# Patient Record
Sex: Female | Born: 1935
Health system: Southern US, Community
[De-identification: ages and names within clinical notes are randomized; demographics above are authoritative.]

## PROBLEM LIST (undated history)

## (undated) DIAGNOSIS — K56609 Unspecified intestinal obstruction, unspecified as to partial versus complete obstruction: Secondary | ICD-10-CM

## (undated) DIAGNOSIS — I219 Acute myocardial infarction, unspecified: Secondary | ICD-10-CM

## (undated) DIAGNOSIS — M359 Systemic involvement of connective tissue, unspecified: Secondary | ICD-10-CM

## (undated) DIAGNOSIS — D509 Iron deficiency anemia, unspecified: Secondary | ICD-10-CM

## (undated) DIAGNOSIS — K56600 Partial intestinal obstruction, unspecified as to cause: Secondary | ICD-10-CM

## (undated) DIAGNOSIS — I10 Essential (primary) hypertension: Secondary | ICD-10-CM

## (undated) DIAGNOSIS — E785 Hyperlipidemia, unspecified: Secondary | ICD-10-CM

## (undated) DIAGNOSIS — C4490 Unspecified malignant neoplasm of skin, unspecified: Secondary | ICD-10-CM

## (undated) DIAGNOSIS — K219 Gastro-esophageal reflux disease without esophagitis: Secondary | ICD-10-CM

## (undated) DIAGNOSIS — M1990 Unspecified osteoarthritis, unspecified site: Secondary | ICD-10-CM

## (undated) DIAGNOSIS — E039 Hypothyroidism, unspecified: Secondary | ICD-10-CM

## (undated) DIAGNOSIS — F419 Anxiety disorder, unspecified: Secondary | ICD-10-CM

## (undated) DIAGNOSIS — I2510 Atherosclerotic heart disease of native coronary artery without angina pectoris: Secondary | ICD-10-CM

## (undated) DIAGNOSIS — S3991XA Unspecified injury of abdomen, initial encounter: Secondary | ICD-10-CM

## (undated) HISTORY — DX: Hypothyroidism, unspecified: E03.9

## (undated) HISTORY — DX: Hyperlipidemia, unspecified: E78.5

## (undated) HISTORY — DX: Essential (primary) hypertension: I10

## (undated) HISTORY — PX: PARTIAL HYSTERECTOMY: SHX80

## (undated) HISTORY — PX: HAND SURGERY: SHX662

## (undated) HISTORY — DX: Unspecified intestinal obstruction, unspecified as to partial versus complete obstruction: K56.609

## (undated) HISTORY — PX: HEMORRHOID BANDING: SHX5850

## (undated) HISTORY — DX: Unspecified malignant neoplasm of skin, unspecified: C44.90

## (undated) HISTORY — DX: Unspecified osteoarthritis, unspecified site: M19.90

## (undated) HISTORY — PX: TONSILLECTOMY: SUR1361

## (undated) HISTORY — DX: Iron deficiency anemia, unspecified: D50.9

## (undated) HISTORY — PX: NOSE SURGERY: SHX723

## (undated) HISTORY — PX: EXPLORATORY LAPAROTOMY: SUR591

## (undated) HISTORY — PX: CHOLECYSTECTOMY: SHX55

## (undated) HISTORY — DX: Unspecified injury of abdomen, initial encounter: S39.91XA

## (undated) HISTORY — PX: CATARACT EXTRACTION: SUR2

## (undated) HISTORY — PX: THYROIDECTOMY: SHX17

## (undated) HISTORY — PX: CARDIAC CATHETERIZATION: SHX172

## (undated) HISTORY — DX: Atherosclerotic heart disease of native coronary artery without angina pectoris: I25.10

## (undated) HISTORY — PX: KNEE ARTHROSCOPY: SUR90

## (undated) HISTORY — PX: APPENDECTOMY: SHX54

---

## 1998-04-24 ENCOUNTER — Ambulatory Visit (HOSPITAL_COMMUNITY): Admission: RE | Admit: 1998-04-24 | Discharge: 1998-04-24 | Payer: Self-pay

## 2001-02-15 ENCOUNTER — Encounter: Payer: Self-pay | Admitting: Orthopedic Surgery

## 2001-02-20 ENCOUNTER — Inpatient Hospital Stay (HOSPITAL_COMMUNITY): Admission: RE | Admit: 2001-02-20 | Discharge: 2001-02-22 | Payer: Self-pay | Admitting: Orthopedic Surgery

## 2001-02-20 ENCOUNTER — Encounter: Payer: Self-pay | Admitting: Orthopedic Surgery

## 2004-06-08 ENCOUNTER — Ambulatory Visit (HOSPITAL_COMMUNITY): Admission: RE | Admit: 2004-06-08 | Discharge: 2004-06-08 | Payer: Self-pay | Admitting: Internal Medicine

## 2004-08-10 ENCOUNTER — Ambulatory Visit: Payer: Self-pay | Admitting: Gastroenterology

## 2004-08-12 ENCOUNTER — Encounter: Admission: RE | Admit: 2004-08-12 | Discharge: 2004-08-12 | Payer: Self-pay | Admitting: General Surgery

## 2004-08-27 ENCOUNTER — Encounter: Admission: RE | Admit: 2004-08-27 | Discharge: 2004-08-27 | Payer: Self-pay | Admitting: General Surgery

## 2004-09-17 ENCOUNTER — Encounter (INDEPENDENT_AMBULATORY_CARE_PROVIDER_SITE_OTHER): Payer: Self-pay | Admitting: *Deleted

## 2004-09-17 ENCOUNTER — Inpatient Hospital Stay (HOSPITAL_COMMUNITY): Admission: RE | Admit: 2004-09-17 | Discharge: 2004-09-21 | Payer: Self-pay | Admitting: General Surgery

## 2004-10-20 ENCOUNTER — Ambulatory Visit: Payer: Self-pay | Admitting: Internal Medicine

## 2004-11-29 ENCOUNTER — Ambulatory Visit (HOSPITAL_COMMUNITY): Admission: RE | Admit: 2004-11-29 | Discharge: 2004-11-29 | Payer: Self-pay | Admitting: Internal Medicine

## 2004-12-23 ENCOUNTER — Encounter (HOSPITAL_COMMUNITY): Admission: RE | Admit: 2004-12-23 | Discharge: 2005-01-22 | Payer: Self-pay | Admitting: Internal Medicine

## 2004-12-23 ENCOUNTER — Ambulatory Visit (HOSPITAL_COMMUNITY): Payer: Self-pay | Admitting: Internal Medicine

## 2006-03-08 ENCOUNTER — Ambulatory Visit: Payer: Self-pay | Admitting: Internal Medicine

## 2006-03-24 ENCOUNTER — Encounter (INDEPENDENT_AMBULATORY_CARE_PROVIDER_SITE_OTHER): Payer: Self-pay | Admitting: Specialist

## 2006-03-24 ENCOUNTER — Ambulatory Visit (HOSPITAL_COMMUNITY): Admission: RE | Admit: 2006-03-24 | Discharge: 2006-03-24 | Payer: Self-pay | Admitting: Internal Medicine

## 2006-03-24 ENCOUNTER — Ambulatory Visit: Payer: Self-pay | Admitting: Internal Medicine

## 2006-03-24 HISTORY — PX: COLONOSCOPY: SHX174

## 2006-03-24 HISTORY — PX: ESOPHAGOGASTRODUODENOSCOPY: SHX1529

## 2006-06-12 ENCOUNTER — Ambulatory Visit: Payer: Self-pay | Admitting: Internal Medicine

## 2006-09-20 ENCOUNTER — Ambulatory Visit: Payer: Self-pay | Admitting: Internal Medicine

## 2007-05-15 ENCOUNTER — Ambulatory Visit: Payer: Self-pay | Admitting: Internal Medicine

## 2007-05-23 ENCOUNTER — Encounter: Admission: RE | Admit: 2007-05-23 | Discharge: 2007-05-23 | Payer: Self-pay | Admitting: Orthopedic Surgery

## 2007-07-02 ENCOUNTER — Encounter: Payer: Self-pay | Admitting: Cardiology

## 2008-02-15 ENCOUNTER — Ambulatory Visit: Payer: Self-pay | Admitting: Internal Medicine

## 2008-09-12 DIAGNOSIS — I219 Acute myocardial infarction, unspecified: Secondary | ICD-10-CM

## 2008-09-12 HISTORY — DX: Acute myocardial infarction, unspecified: I21.9

## 2008-09-19 ENCOUNTER — Encounter: Payer: Self-pay | Admitting: Cardiology

## 2008-09-28 ENCOUNTER — Encounter: Payer: Self-pay | Admitting: Cardiology

## 2008-09-28 ENCOUNTER — Ambulatory Visit: Payer: Self-pay | Admitting: Cardiology

## 2008-09-28 ENCOUNTER — Inpatient Hospital Stay (HOSPITAL_COMMUNITY): Admission: EM | Admit: 2008-09-28 | Discharge: 2008-09-30 | Payer: Self-pay | Admitting: Cardiovascular Disease

## 2008-09-29 ENCOUNTER — Encounter: Payer: Self-pay | Admitting: Cardiology

## 2008-09-29 ENCOUNTER — Ambulatory Visit: Payer: Self-pay | Admitting: *Deleted

## 2008-10-03 ENCOUNTER — Encounter: Payer: Self-pay | Admitting: Cardiology

## 2008-10-03 ENCOUNTER — Ambulatory Visit: Payer: Self-pay | Admitting: Cardiology

## 2008-10-04 ENCOUNTER — Ambulatory Visit: Payer: Self-pay | Admitting: Cardiovascular Disease

## 2008-10-04 ENCOUNTER — Inpatient Hospital Stay (HOSPITAL_COMMUNITY): Admission: AD | Admit: 2008-10-04 | Discharge: 2008-10-08 | Payer: Self-pay | Admitting: Cardiology

## 2008-10-05 ENCOUNTER — Encounter: Payer: Self-pay | Admitting: Cardiology

## 2008-10-21 ENCOUNTER — Ambulatory Visit: Payer: Self-pay | Admitting: Cardiology

## 2008-10-21 DIAGNOSIS — I2510 Atherosclerotic heart disease of native coronary artery without angina pectoris: Secondary | ICD-10-CM | POA: Diagnosis present

## 2008-10-21 DIAGNOSIS — E782 Mixed hyperlipidemia: Secondary | ICD-10-CM | POA: Insufficient documentation

## 2009-01-12 ENCOUNTER — Ambulatory Visit: Payer: Self-pay | Admitting: Cardiology

## 2009-04-24 ENCOUNTER — Encounter: Payer: Self-pay | Admitting: Cardiology

## 2009-04-29 ENCOUNTER — Encounter (INDEPENDENT_AMBULATORY_CARE_PROVIDER_SITE_OTHER): Payer: Self-pay | Admitting: *Deleted

## 2009-05-01 ENCOUNTER — Ambulatory Visit: Payer: Self-pay | Admitting: Cardiology

## 2009-05-01 ENCOUNTER — Encounter: Payer: Self-pay | Admitting: Physician Assistant

## 2009-05-01 DIAGNOSIS — D638 Anemia in other chronic diseases classified elsewhere: Secondary | ICD-10-CM

## 2009-05-03 ENCOUNTER — Ambulatory Visit: Payer: Self-pay | Admitting: Cardiology

## 2009-05-05 ENCOUNTER — Ambulatory Visit: Payer: Self-pay | Admitting: Cardiology

## 2009-05-05 ENCOUNTER — Encounter: Payer: Self-pay | Admitting: Cardiology

## 2009-05-05 ENCOUNTER — Inpatient Hospital Stay (HOSPITAL_COMMUNITY): Admission: AD | Admit: 2009-05-05 | Discharge: 2009-05-09 | Payer: Self-pay | Admitting: Cardiology

## 2009-05-07 ENCOUNTER — Encounter: Payer: Self-pay | Admitting: Cardiology

## 2009-05-08 ENCOUNTER — Encounter: Payer: Self-pay | Admitting: Cardiology

## 2009-05-21 ENCOUNTER — Ambulatory Visit: Payer: Self-pay | Admitting: Cardiology

## 2009-07-10 ENCOUNTER — Telehealth (INDEPENDENT_AMBULATORY_CARE_PROVIDER_SITE_OTHER): Payer: Self-pay | Admitting: *Deleted

## 2009-07-22 ENCOUNTER — Encounter: Payer: Self-pay | Admitting: Cardiology

## 2009-07-29 DIAGNOSIS — D509 Iron deficiency anemia, unspecified: Secondary | ICD-10-CM | POA: Insufficient documentation

## 2009-07-31 ENCOUNTER — Ambulatory Visit (HOSPITAL_COMMUNITY): Admission: RE | Admit: 2009-07-31 | Discharge: 2009-07-31 | Payer: Self-pay | Admitting: Internal Medicine

## 2009-07-31 ENCOUNTER — Encounter: Payer: Self-pay | Admitting: Urgent Care

## 2009-07-31 ENCOUNTER — Ambulatory Visit: Payer: Self-pay | Admitting: Internal Medicine

## 2009-07-31 DIAGNOSIS — K633 Ulcer of intestine: Secondary | ICD-10-CM | POA: Insufficient documentation

## 2009-07-31 DIAGNOSIS — R109 Unspecified abdominal pain: Secondary | ICD-10-CM | POA: Insufficient documentation

## 2009-07-31 DIAGNOSIS — E039 Hypothyroidism, unspecified: Secondary | ICD-10-CM | POA: Insufficient documentation

## 2009-07-31 DIAGNOSIS — M129 Arthropathy, unspecified: Secondary | ICD-10-CM | POA: Insufficient documentation

## 2009-07-31 LAB — CONVERTED CEMR LAB
Alkaline Phosphatase: 63 units/L (ref 39–117)
Bilirubin, Direct: <0.1 mg/dL (ref 0.0–0.3)
Chloride: 104 meq/L (ref 96–112)
Creatinine, Ser: 0.93 mg/dL (ref 0.40–1.20)
Eosinophils Absolute: 0.1 10*3/uL (ref 0.0–0.7)
Lymphocytes Relative: 14 % (ref 12–46)
Lymphs Abs: 1.1 10*3/uL (ref 0.7–4.0)
MCV: 93.9 fL (ref 78.0–100.0)
Neutro Abs: 5.4 10*3/uL (ref 1.7–7.7)
Neutrophils Relative %: 76 % (ref 43–77)
Platelets: 231 10*3/uL (ref 150–400)
Sodium: 142 meq/L (ref 135–145)
Total Protein: 5.5 g/dL — ABNORMAL LOW (ref 6.0–8.3)
WBC: 7.2 10*3/uL (ref 4.0–10.5)

## 2009-08-03 ENCOUNTER — Encounter: Payer: Self-pay | Admitting: Internal Medicine

## 2009-08-04 ENCOUNTER — Encounter: Payer: Self-pay | Admitting: Internal Medicine

## 2009-08-04 ENCOUNTER — Encounter (INDEPENDENT_AMBULATORY_CARE_PROVIDER_SITE_OTHER): Payer: Self-pay | Admitting: *Deleted

## 2009-08-10 ENCOUNTER — Encounter: Payer: Self-pay | Admitting: Cardiology

## 2009-08-12 ENCOUNTER — Telehealth (INDEPENDENT_AMBULATORY_CARE_PROVIDER_SITE_OTHER): Payer: Self-pay | Admitting: *Deleted

## 2009-09-02 ENCOUNTER — Encounter (INDEPENDENT_AMBULATORY_CARE_PROVIDER_SITE_OTHER): Payer: Self-pay | Admitting: *Deleted

## 2009-09-18 ENCOUNTER — Ambulatory Visit: Payer: Self-pay | Admitting: Cardiology

## 2009-09-18 DIAGNOSIS — I10 Essential (primary) hypertension: Secondary | ICD-10-CM

## 2009-10-19 ENCOUNTER — Encounter: Payer: Self-pay | Admitting: Gastroenterology

## 2009-10-20 ENCOUNTER — Encounter (INDEPENDENT_AMBULATORY_CARE_PROVIDER_SITE_OTHER): Payer: Self-pay | Admitting: *Deleted

## 2009-10-21 ENCOUNTER — Encounter: Payer: Self-pay | Admitting: Cardiology

## 2009-10-26 ENCOUNTER — Encounter (INDEPENDENT_AMBULATORY_CARE_PROVIDER_SITE_OTHER): Payer: Self-pay | Admitting: *Deleted

## 2009-11-16 ENCOUNTER — Encounter (INDEPENDENT_AMBULATORY_CARE_PROVIDER_SITE_OTHER): Payer: Self-pay | Admitting: *Deleted

## 2009-11-16 ENCOUNTER — Encounter: Payer: Self-pay | Admitting: Cardiology

## 2009-11-19 ENCOUNTER — Encounter: Payer: Self-pay | Admitting: Cardiology

## 2009-12-14 ENCOUNTER — Encounter (INDEPENDENT_AMBULATORY_CARE_PROVIDER_SITE_OTHER): Payer: Self-pay | Admitting: *Deleted

## 2009-12-21 ENCOUNTER — Encounter: Payer: Self-pay | Admitting: Cardiology

## 2009-12-21 ENCOUNTER — Encounter: Payer: Self-pay | Admitting: Physician Assistant

## 2009-12-21 ENCOUNTER — Encounter (INDEPENDENT_AMBULATORY_CARE_PROVIDER_SITE_OTHER): Payer: Self-pay | Admitting: *Deleted

## 2009-12-22 ENCOUNTER — Inpatient Hospital Stay (HOSPITAL_COMMUNITY): Admission: EM | Admit: 2009-12-22 | Discharge: 2009-12-24 | Payer: Self-pay | Admitting: Cardiology

## 2009-12-22 ENCOUNTER — Encounter: Payer: Self-pay | Admitting: Physician Assistant

## 2009-12-22 ENCOUNTER — Ambulatory Visit: Payer: Self-pay | Admitting: Cardiovascular Disease

## 2009-12-22 ENCOUNTER — Ambulatory Visit: Payer: Self-pay | Admitting: Cardiology

## 2009-12-23 ENCOUNTER — Encounter: Payer: Self-pay | Admitting: Physician Assistant

## 2010-01-19 ENCOUNTER — Ambulatory Visit: Payer: Self-pay | Admitting: Internal Medicine

## 2010-01-21 LAB — CONVERTED CEMR LAB: Magnesium: 2 mg/dL (ref 1.5–2.5)

## 2010-01-27 ENCOUNTER — Encounter: Payer: Self-pay | Admitting: Internal Medicine

## 2010-01-27 ENCOUNTER — Ambulatory Visit: Payer: Self-pay | Admitting: Physician Assistant

## 2010-01-27 DIAGNOSIS — I2510 Atherosclerotic heart disease of native coronary artery without angina pectoris: Secondary | ICD-10-CM | POA: Insufficient documentation

## 2010-01-28 DIAGNOSIS — Z8719 Personal history of other diseases of the digestive system: Secondary | ICD-10-CM | POA: Insufficient documentation

## 2010-01-28 DIAGNOSIS — K922 Gastrointestinal hemorrhage, unspecified: Secondary | ICD-10-CM | POA: Insufficient documentation

## 2010-02-02 ENCOUNTER — Telehealth: Payer: Self-pay | Admitting: Physician Assistant

## 2010-04-12 ENCOUNTER — Ambulatory Visit: Payer: Self-pay | Admitting: Cardiology

## 2010-07-15 ENCOUNTER — Encounter (INDEPENDENT_AMBULATORY_CARE_PROVIDER_SITE_OTHER): Payer: Self-pay | Admitting: *Deleted

## 2010-07-26 ENCOUNTER — Encounter: Payer: Self-pay | Admitting: Cardiology

## 2010-10-12 ENCOUNTER — Ambulatory Visit
Admission: RE | Admit: 2010-10-12 | Discharge: 2010-10-12 | Payer: Self-pay | Source: Home / Self Care | Attending: Cardiology | Admitting: Cardiology

## 2010-10-12 ENCOUNTER — Encounter: Payer: Self-pay | Admitting: Cardiology

## 2010-10-14 NOTE — Medication Information (Signed)
Summary: Tax adviser   Imported By: Ricard Dillon 10/19/2009 08:52:27  _____________________________________________________________________  External Attachment:    Type:   Image     Comment:   External Document  Appended Document: RX Folder-entocort    Prescriptions: ENTOCORT EC 3 MG XR24H-CAP (BUDESONIDE) Take 2 tablet by mouth once a day  #180 x 3   Entered and Authorized by:   Leanna Battles. Dixon Boos   Signed by:   Leanna Battles Lewis PA-C on 10/19/2009   Method used:   Faxed to ...       [facility] (retail)             , [place]         Ph: [phone]       Fax: [phone]   RxID:   [id number]    Patient needs OV with RMR only in the next 2-3 months for f/u small bowel ulcers

## 2010-10-14 NOTE — Assessment & Plan Note (Signed)
Summary: 3 MO FU PER AUG REMINDER   Visit Type:  Follow-up Primary Provider:  Dr. Fara Chute   History of Present Illness: 77 year old woman presents for followup. She denies any significant angina. Reports NYHA class II dyspnea on exertion. She does state that she felt very fatigued on Crestor, and this medicine has been cut back to twice weekly by Dr. Neita Carp who is following her lipids.  Interval phone note was reviewed. Medications have been modified since her last visit. She states she has been checking her blood pressure at home, and typically her systolic runs in the "140-160" range, allowing her to take the medicines outlined below regularly. If she finds her blood pressure is low, she holds her medications.   Preventive Screening-Counseling & Management  Alcohol-Tobacco     Smoking Status: never  Current Medications (verified): 1)  Aspir-Trin 325 Mg Tbec (Aspirin) .... Take 1/2 Tablet By Mouth Once A Day 2)  Nexium 40 Mg Cpdr (Esomeprazole Magnesium) .... Take 1 Tablet By Mouth Once A Day 3)  Ferrous Sulfate 325 (65 Fe) Mg  Tabs (Ferrous Sulfate) .... Take 1 Tablet By Mouth Two Times A Day 4)  Metoprolol Tartrate 25 Mg Tabs (Metoprolol Tartrate) .... Take 1/2 Tablet By Mouth Two Times A Day 5)  Entocort Ec 3 Mg Xr24h-Cap (Budesonide) .... Take 2 Tablet By Mouth Once A Day As Needed 6)  Diclofenac Sodium 75 Mg Tbec (Diclofenac Sodium) .... Take 1 Tablet By Mouth Two Times A Day 7)  Fish Oil 1000 Mg Caps (Omega-3 Fatty Acids) .... Take 1 Tablet By Mouth Two Times A Day 8)  Vitamin B-12 500 Mcg  Tabs (Cyanocobalamin) .... Once Daily 9)  Lisinopril 40 Mg Tabs (Lisinopril) .... Take One Half  Tablet By Mouth Daily 10)  Nitrostat 0.4 Mg Subl (Nitroglycerin) .Marland Kitchen.. 1 Tablet Under Tongue At Onset of Chest Pain; You May Repeat Every 5 Minutes For Up To 3 Doses. 11)  Levoxyl 100 Mcg Tabs (Levothyroxine Sodium) .... Take 1 Tablet By Mouth Once A Day 12)  Crestor 20 Mg Tabs (Rosuvastatin  Calcium) .... Take One Tablet By Mouth Twice Weekly 13)  Vitamin D 1000 Unit Tabs (Cholecalciferol) .... Take 1 Tablet By Mouth Once A Day 14)  Isosorbide Mononitrate Cr 30 Mg Xr24h-Tab (Isosorbide Mononitrate) .... Take 1/2  Tablet By Mouth Once A Day 15)  Clonazepam 0.5 Mg Tabs (Clonazepam) .... Take 1/2-1 By Mouth Every Six Hours As Needed Anxiety 16)  Caltrate 600+d 600-400 Mg-Unit Tabs (Calcium Carbonate-Vitamin D) .... Take 1 Tablet By Mouth Once A Day  Allergies (verified): 1)  ! Codeine 2)  ! Celebrex 3)  ! * Oxycodone/apap  Comments:  Nurse/Medical Assistant: The patient's medication bottles and allergies were reviewed with the patient and were updated in the Medication and Allergy Lists.  Past History:  Past Medical History: Last updated: 09/18/2009 Arthritis CAD - NSTEMI 1/10, PTCA nondominant RCA 1/10, LVEF normal Hyperlipidemia Hypertension Hypothyroidism Small bowel ulcers on GIVENS capsule study 03/24/2009, multiple areas of ulceration, mid-distal SB, active bleeding distal SB Prometheus panel suggestive of Crohn's disease  Iron deficiency anemia Colonoscopy/EGD by Dr Jena Gauss 03/24/2009->benign bx, normal  Social History: Last updated: 07/31/2009 Widowed, lives w/ son 3 living children, 1 deceased Tobacco Use - No.  Alcohol Use - no Regular Exercise - yes Drug Use - no  Review of Systems  The patient denies anorexia, fever, chest pain, syncope, dyspnea on exertion, peripheral edema, melena, and hematochezia.  Otherwise reviewed and negative.  Vital Signs:  Patient profile:   76 year old female Height:      64 inches Weight:      137 pounds Pulse rate:   56 / minute BP sitting:   145 / 76  (left arm) Cuff size:   regular  Vitals Entered By: Carlye Grippe (April 12, 2010 10:41 AM)  Physical Exam  Additional Exam:  GEN:77 year old female, sitting a right, in no distress HEENT: NCAT,PERRLA,EOMI NECK: palpable pulses, no bruits; no JVD; no  TM LUNGS: CTA bilaterally HEART: RRR (S1S2); no significant murmurs; no rubs; no gallops ABD: soft, NT; intact BS WJX:BJYNWG left wrist incision site, with no hematoma. Palpable pulse. No lower extremity edema SKIN: warm, dry MUSC: no obvious deformity NEURO: A/O (x3)     Cardiac Cath  Procedure date:  12/24/2009  Findings:      PROCEDURAL FINDINGS:  Aortic pressure is 144/52 with a mean of 88, left   ventricular pressure is 149/3.      Left ventriculography shows normal LV function.  The LVEF is 55% by   visual estimate.  There is no mitral regurgitation.      Coronary angiography:  The left mainstem is free of significant disease.   It divides into the LAD and left circumflex.      LAD:  The LAD is a large vessel that wraps around the left ventricular   apex.  There is a mild plaque proximally with 20% to 30% stenosis   present.  There are 2 small diagonal branches present.  The first   diagonal, which is very small, has an 80% ostial lesion.  The second   diagonal has a 50% ostial lesion.  Both of these are 1 mm or smaller   vessels.      Left circumflex:  The left circumflex is dominant.  It supplies 2 large   OM branches, a left PDA branch, and 2 small left posterolateral   branches.  There is no obstructive disease throughout the course of the   left circumflex.  There are some minor irregularities in the midportion   of the vessel.      Right coronary artery:  This is a small, nondominant vessel.  There is   subtotal occlusion in the midportion of the vessel with a 99% stenosis.   There is TIMI II flow distally.  This appearance is essentially   unchanged from the previous study.   Impression & Recommendations:  Problem # 1:  CAD (ICD-414.00)  Symptomatically stable on medical therapy. Medical management of obstructive disease involving a small nondominant RCA. Clinic followup for 6 months, sooner if needed.  Her updated medication list for this problem  includes:    Aspir-trin 325 Mg Tbec (Aspirin) .Marland Kitchen... Take 1/2 tablet by mouth once a day    Metoprolol Tartrate 25 Mg Tabs (Metoprolol tartrate) .Marland Kitchen... Take 1/2 tablet by mouth two times a day    Lisinopril 40 Mg Tabs (Lisinopril) .Marland Kitchen... Take one half  tablet by mouth daily    Nitrostat 0.4 Mg Subl (Nitroglycerin) .Marland Kitchen... 1 tablet under tongue at onset of chest pain; you may repeat every 5 minutes for up to 3 doses.    Isosorbide Mononitrate Cr 30 Mg Xr24h-tab (Isosorbide mononitrate) .Marland Kitchen... Take 1/2  tablet by mouth once a day  Problem # 2:  ESSENTIAL HYPERTENSION, BENIGN (ICD-401.1)  Continue present medical regimen. Patient to check blood pressure at home as she has been.  The  following medications were removed from the medication list:    Hydrochlorothiazide 12.5 Mg Tabs (Hydrochlorothiazide) .Marland Kitchen... Take one tablet by mouth daily. Her updated medication list for this problem includes:    Aspir-trin 325 Mg Tbec (Aspirin) .Marland Kitchen... Take 1/2 tablet by mouth once a day    Metoprolol Tartrate 25 Mg Tabs (Metoprolol tartrate) .Marland Kitchen... Take 1/2 tablet by mouth two times a day    Lisinopril 40 Mg Tabs (Lisinopril) .Marland Kitchen... Take one half  tablet by mouth daily  Problem # 3:  MIXED HYPERLIPIDEMIA (ICD-272.2)  Patient taking her Crestor only twice weekly. She states she feels better energy with this. Followup lipids per Dr. Neita Carp.  Her updated medication list for this problem includes:    Crestor 20 Mg Tabs (Rosuvastatin calcium) .Marland Kitchen... Take one tablet by mouth twice weekly  Patient Instructions: 1)  Your physician wants you to follow-up in: 6 months. You will receive a reminder letter in the mail one-two months in advance. If you don't receive a letter, please call our office to schedule the follow-up appointment. 2)  Your physician recommends that you continue on your current medications as directed. Please refer to the Current Medication list given to you today.

## 2010-10-14 NOTE — Consult Note (Signed)
Summary: CARDIOLOGY CONSULT/ [facility]  CARDIOLOGY CONSULT/ [facility]   Imported By: Zachary George 01/26/2010 14:00:15  _____________________________________________________________________  External Attachment:    Type:   Image     Comment:   External Document

## 2010-10-14 NOTE — Medication Information (Signed)
Summary: nexium refill  nexium refill   Imported By: Hendricks Limes LPN 16/06/9603 54:09:81  _____________________________________________________________________  External Attachment:    Type:   Image     Comment:   External Document

## 2010-10-14 NOTE — Letter (Signed)
Summary: Scheduled Appointment  [facility]  [address]   [place], [place] [address]   Phone: [phone]  Fax: [phone]    December 21, 2009   Dear: Andrea Jackson            DOB: 1934-08-25    I have been instructed to schedule you an appointment in our office.  Your appointment is as follows:   Date: 01/19/10       Time: 9:00     Please be here 15 minutes early.   Provider: Rourk    Please contact the office if you need to reschedule this appointment for a more convenient time.   Thank you,    [facility] Ph: [phone]   Fax: [phone]

## 2010-10-14 NOTE — Letter (Signed)
Summary: Engineer, materials at [facility]  [address]   [place], [place] [address]   Phone: [phone]  Fax: [phone]        October 26, 2009 MRN: [id number]    [facility] [address] [place], [place]  [address]    Dear Ms. Makar,  Your test ordered by Selena Batten has been reviewed by your physician (or physician assistant) and was found to be normal or stable. Your physician (or physician assistant) felt no changes were needed at this time.  ____ Echocardiogram  ____ Cardiac Stress Test  __X__ Lab Work  ____ Peripheral vascular study of arms, legs or neck  ____ CT scan or X-ray  ____ Lung or Breathing test  ____ Other:   Thank you.   Cyril Loosen, RN, BSN    Duane Boston, M.D., F.A.C.C. Thressa Sheller, M.D., F.A.C.C. Oneal Grout, M.D., F.A.C.C. Cheree Ditto, M.D., F.A.C.C. Daiva Nakayama, M.D., F.A.C.C. Kenney Houseman, M.D., F.A.C.C. Jeanne Ivan, PA-C

## 2010-10-14 NOTE — Assessment & Plan Note (Signed)
Summary: 3 MO FU PER DEC REMINDER-SRS   Visit Type:  Follow-up Primary Provider:  Dr. Fara Chute   History of Present Illness: 77 year old woman presents for a followup visit. She reports feeling well, without significant angina since I last saw her. She underwent elective right knee arthroscopic surgery in mid December without incident. She is doing physical therapy now.  She has been taking both simvastatin and Crestor mistakenly. We talked about this today.  Labs from late November revealed a total cholesterol of 242, triglycerides 144, HDL 67, LDL 146, AST 17, ALT 27, BUN 21, creatinine 0.9, potassium 4.2.  She denies having any palpitations or progressive shortness of breath. Appetite is stable.  Preventive Screening-Counseling & Management  Alcohol-Tobacco     Smoking Status: never  Current Medications (verified): 1)  Aspirin Ec 325 Mg Tbec (Aspirin) .... Take One Tablet By Mouth Daily 2)  Nexium 40 Mg Cpdr (Esomeprazole Magnesium) .... Take 1 Tablet By Mouth Two Times A Day 3)  Ferrous Sulfate 325 (65 Fe) Mg  Tabs (Ferrous Sulfate) .... Take 1 Tablet By Mouth Two Times A Day 4)  Metoprolol Tartrate 25 Mg Tabs (Metoprolol Tartrate) .Marland Kitchen.. 1 1/2 Tabs Two Times A Day 5)  Entocort Ec 3 Mg Xr24h-Cap (Budesonide) .... Take 2 Tablet By Mouth Once A Day 6)  Diclofenac Sodium 75 Mg Tbec (Diclofenac Sodium) .... Take 1 Tablet By Mouth Two Times A Day 7)  Fish Oil 1000 Mg Caps (Omega-3 Fatty Acids) .... One Tablet Every Morning 8)  Vitamin C 500 Mg  Tabs (Ascorbic Acid) .... Once Daily 9)  Vitamin B-12 500 Mcg  Tabs (Cyanocobalamin) .... Once Daily 10)  Hydrochlorothiazide 12.5 Mg Tabs (Hydrochlorothiazide) .... Take One Tablet By Mouth Daily. 11)  Lisinopril 40 Mg Tabs (Lisinopril) .... Take One Tablet By Mouth Daily 12)  Nitroglycerin 0.4 Mg Subl (Nitroglycerin) .... One Tablet Under Tongue Every 5 Minutes As Needed For Chest Pain---May Repeat Times Three 13)  Levoxyl 100 Mcg Tabs  (Levothyroxine Sodium) .... Take 1 Tablet By Mouth Once A Day 14)  Crestor 20 Mg Tabs (Rosuvastatin Calcium) .... Take One Tablet By Mouth At Bedtime 15)  Simvastatin 40 Mg Tabs (Simvastatin) .... Take 1/2 Tablet By Mouth Once A Day 16)  Diazepam 5 Mg Tabs (Diazepam) .... Take 1/2-1 By Mouth 2-3 Times Daily As Needed 17)  Tramadol Hcl 50 Mg Tabs (Tramadol Hcl) .... Take 1 Tablet By Mouth Three Times A Day As Needed  Allergies (verified): 1)  ! Codeine 2)  ! Celebrex 3)  ! * Oxycodone/apap  Comments:  Nurse/Medical Assistant: The patient's medications and allergies were reviewed with the patient and were updated in the Medication and Allergy Lists. Bottles brought.  Past History:  Social History: Last updated: 07/31/2009 Widowed, lives w/ son 3 living children, 1 deceased Tobacco Use - No.  Alcohol Use - no Regular Exercise - yes Drug Use - no  Past Medical History: Arthritis CAD - NSTEMI 1/10, PTCA nondominant RCA 1/10, LVEF normal Hyperlipidemia Hypertension Hypothyroidism Small bowel ulcers on GIVENS capsule study 03/24/2009, multiple areas of ulceration, mid-distal SB, active bleeding distal SB Prometheus panel suggestive of Crohn's disease  Iron deficiency anemia Colonoscopy/EGD by Dr Jena Gauss 03/24/2009->benign bx, normal  Review of Systems  The patient denies anorexia, fever, weight loss, chest pain, syncope, dyspnea on exertion, abdominal pain, melena, hematochezia, and severe indigestion/heartburn.         Residual right knee discomfort. Otherwise reviewed and negative.  Vital Signs:  Patient profile:  77 year old female Height:      64 inches Weight:      137 pounds Pulse rate:   49 / minute BP sitting:   124 / 70  (left arm) Cuff size:   regular  Vitals Entered By: Carlye Grippe (September 18, 2009 9:41 AM)   Physical Exam  Additional Exam:  Normally nourished woman in no acute distress. HEENT: Conjunctiva and lids normal, oropharynx clear. Neck:  Supple, no carotid bruits. No thyromegaly. Lungs: Clear without labored breathing. Cardiac: Regular rate and rhythm, no S3 gallop. No rub. Abdomen: Soft, nontender. Extremities: Venous varicosities noted. No pitting edema. Distal pulses 2+. Skin: Warm and dry. Musculoskeletal: No kyphosis. Neuropsychiatric: Alert oriented x3.   Impression & Recommendations:  Problem # 1:  CORONARY ATHEROSCLEROSIS NATIVE CORONARY ARTERY (ICD-414.01)  No active angina on present medical regimen. Followup visit will be arranged in 6 months.  The following medications were removed from the medication list:    Isosorbide Mononitrate Cr 30 Mg Xr24h-tab (Isosorbide mononitrate) .Marland Kitchen... Take 1/2  tab by mouth at bedtime Her updated medication list for this problem includes:    Aspirin Ec 325 Mg Tbec (Aspirin) .Marland Kitchen... Take one tablet by mouth daily    Metoprolol Tartrate 25 Mg Tabs (Metoprolol tartrate) .Marland Kitchen... 1 1/2 tabs two times a day    Lisinopril 40 Mg Tabs (Lisinopril) .Marland Kitchen... Take one tablet by mouth daily    Nitroglycerin 0.4 Mg Subl (Nitroglycerin) ..... One tablet under tongue every 5 minutes as needed for chest pain---may repeat times three  Problem # 2:  MIXED HYPERLIPIDEMIA (ICD-272.2)  Patient to stop simvastatin, and continue on Crestor. Followup lipid profile liver function tests will be obtained in 2 months.  Her updated medication list for this problem includes:    Crestor 20 Mg Tabs (Rosuvastatin calcium) .Marland Kitchen... Take one tablet by mouth at bedtime    Simvastatin 40 Mg Tabs (Simvastatin) .Marland Kitchen... Take 1/2 tablet by mouth once a day  Problem # 3:  ESSENTIAL HYPERTENSION, BENIGN (ICD-401.1)  Blood pressure well controlled today.  Her updated medication list for this problem includes:    Aspirin Ec 325 Mg Tbec (Aspirin) .Marland Kitchen... Take one tablet by mouth daily    Metoprolol Tartrate 25 Mg Tabs (Metoprolol tartrate) .Marland Kitchen... 1 1/2 tabs two times a day    Hydrochlorothiazide 12.5 Mg Tabs (Hydrochlorothiazide)  .Marland Kitchen... Take one tablet by mouth daily.    Lisinopril 40 Mg Tabs (Lisinopril) .Marland Kitchen... Take one tablet by mouth daily  Patient Instructions: 1)  FLP/LFT in 2 months. 2)  Only take Crestor, not the Zocor.  3)  Follow up in 6 months.

## 2010-10-14 NOTE — Progress Notes (Signed)
Summary: Decreased BP  Phone Note Call from Patient Call back at [facility] Phone [phone]   Summary of Call: Pt called stating she's concerned about HR and BP. She states during recent OV her Metoprolol was decreased to 1 tablet two times a day and ASA was decreased to 81mg  from 325mg . Pt also states isosorbide was decreased to 1/2 tablets. This is not indicated in pt's chart or office note from last week. She states the PA told her to make this change when he was in the room with her.   She states BP running low and when she bends over she feels like she's going to pass out. She states BP has been as low as 93/74. Sunday am BP was 102/65 w/HR 100. She stats she was extrememly diaphoretic and had to take a cold shower before she could go to church. She states Monday HR was 75. She states this am BP was 122/103 and HR was 90. She had not taken her meds whne she took this BP, though.  She is concerned that BP may be too low and HR may be too high.  Initial call taken by: Cyril Loosen, RN, BSN,  Feb 02, 2010 3:08 PM  Follow-up for Phone Call        D/C HCTZ. Follow-up by: Nelida Meuse, PA-C,  Feb 03, 2010 11:21 AM  Additional Follow-up for Phone Call Additional follow up Details #1::        Pt states she hasn't taken any HCTZ for the past week. She states she's also only taken about 1 or 2Lisinopril in the past week b/c of low BP.  Cyril Loosen, RN, BSN  Feb 03, 2010 11:56 AM     Additional Follow-up for Phone Call Additional follow up Details #2::    d/c lisinopril Follow-up by: Nelida Meuse, PA-C,  Feb 03, 2010 12:56 PM  Additional Follow-up for Phone Call Additional follow up Details #3:: Details for Additional Follow-up Action Taken: Pt notified and verbalized understanding. Additional Follow-up by: Cyril Loosen, RN, BSN,  Feb 03, 2010 2:18 PM

## 2010-10-14 NOTE — Letter (Signed)
Summary: Recall Office Visit  [facility]  [address]   [place], [place] [address]   Phone: [phone]  Fax: [phone]      July 15, 2010   HAYA HEMLER [address] [place], [place]  [address] 1934-07-22   Dear Ms. Sabala,   According to our records, it is time for you to schedule a follow-up office visit with [place].   At your convenience, please call [phone] to schedule an office visit. If you have any questions, concerns, or feel that this letter is in error, we would appreciate your call.   Sincerely,    Diana Eves  [facility] Ph: [phone]   Fax: [phone]

## 2010-10-14 NOTE — Letter (Signed)
Summary: Generic Engineer, agricultural at [facility] [address]   [place], [place] [address]   Phone: [phone]  Fax: [phone]        November 16, 2009 MRN: [id number]    Andrea Jackson [address] [place], [place]  [address]    Dear [name],   According to our records, it is now time for your follow up lab work.  Please take the enclosed order to the [facility] at your earliest convenience.  Reminder:  Nothing to eat or drink after 12 midnight prior to labs.        Sincerely,  Hoover Brunette, LPN  This letter has been electronically signed by your physician.

## 2010-10-14 NOTE — Miscellaneous (Signed)
Summary: Orders Update  Clinical Lists Changes  Orders: Added new Test order of T-Lipid Profile ([id number]) - Signed Added new Test order of T-Hepatic Function ([id number]) - Signed 

## 2010-10-14 NOTE — Miscellaneous (Signed)
Summary: Orders Update - FLP/LFT  Clinical Lists Changes  Orders: Added new Test order of T-Lipid Profile ([id number]) - Signed Added new Test order of T-Hepatic Function ([id number]) - Signed 

## 2010-10-14 NOTE — Letter (Signed)
Summary: [facility]  [facility]  [address]   [place], [place] [address]   Phone: [phone]  Fax: [phone]    12/14/2009  Andrea Jackson [address] [place], [place]  [address] [birth year]  Dear [name],   Your physician has indicated that:   _______it is time to schedule an appointment.   _______you missed your appointment on______ and need to call and  reschedule.   _______you need to have lab work done.   _______you need to schedule an appointment to discuss lab or test results.   _______you need to call to reschedule your appointment that was scheduled on _________.   Please call our office at  [phone].    Thank you,    [facility] Ph: [phone]   Fax: [phone]

## 2010-10-14 NOTE — Letter (Signed)
Summary: [facility] D/C DR.Fara Chute  [facility] D/C DR.PAUL SASSER   Imported By: Zachary George 01/26/2010 13:59:47  _____________________________________________________________________  External Attachment:    Type:   Image     Comment:   External Document

## 2010-10-14 NOTE — Assessment & Plan Note (Signed)
Summary: EPH-POST CATH FU 4/13   Visit Type:  Follow-up Primary Provider:  Dr. Neita Carp   History of Present Illness: 77 year old female, with known CAD followed by Dr. Diona Browner, presents for posthospital followup. She initially presented to [facility] and was referred to Dr. Andee Lineman for formal consultation. Serial cardiac markers were normal. However, she continued to have ongoing pain, and was transferred to [facility] for cardiac catheterization. She was found to have stable coronary anatomy with severe stenosis of small, nondominant RCA. Normal LV function. Medical therapy recommended. No medication adjustments noted.  Since discharge, patient denies any recurrent chest discomfort. She refers to her initial symptoms as a "soreness", when she presented here to [facility]. She continues to walk regularly and work in the yard, with no associated symptoms. Of note, she does have intermittent dizziness, particularly when bending over, but denies any frank syncope.    Preventive Screening-Counseling & Management  Alcohol-Tobacco     Smoking Status: never  Current Medications (verified): 1)  Aspirin Ec 325 Mg Tbec (Aspirin) .... Take One Tablet By Mouth Daily 2)  Nexium 40 Mg Cpdr (Esomeprazole Magnesium) .... Take 1 Tablet By Mouth Once A Day 3)  Ferrous Sulfate 325 (65 Fe) Mg  Tabs (Ferrous Sulfate) .... Take 1 Tablet By Mouth Two Times A Day 4)  Metoprolol Tartrate 25 Mg Tabs (Metoprolol Tartrate) .Marland Kitchen.. 1 1/2 Tabs Two Times A Day 5)  Entocort Ec 3 Mg Xr24h-Cap (Budesonide) .... Take 1 Tablet By Mouth Once A Day 6)  Diclofenac Sodium 75 Mg Tbec (Diclofenac Sodium) .... Take 1 Tablet By Mouth Two Times A Day 7)  Fish Oil 1000 Mg Caps (Omega-3 Fatty Acids) .... Take 1 Tablet By Mouth Two Times A Day 8)  Vitamin B-12 500 Mcg  Tabs (Cyanocobalamin) .... Once Daily 9)  Hydrochlorothiazide 12.5 Mg Tabs (Hydrochlorothiazide) .... Take One Tablet By Mouth Daily. 10)  Lisinopril 40 Mg Tabs (Lisinopril)  .... Take One Tablet By Mouth Daily 11)  Nitroglycerin 0.4 Mg Subl (Nitroglycerin) .... One Tablet Under Tongue Every 5 Minutes As Needed For Chest Pain---May Repeat Times Three 12)  Levoxyl 100 Mcg Tabs (Levothyroxine Sodium) .... Take 1 Tablet By Mouth Once A Day 13)  Crestor 20 Mg Tabs (Rosuvastatin Calcium) .... Take One Tablet By Mouth At Bedtime 14)  Tramadol Hcl 50 Mg Tabs (Tramadol Hcl) .... Take 1 Tablet By Mouth Twice A Day As Needed 15)  Vitamin D .... Once Daily 16)  Isosorbide Mononitrate Cr 30 Mg Xr24h-Tab (Isosorbide Mononitrate) .... Take 1 Tablet By Mouth Once A Day 17)  Clonazepam 0.5 Mg Tabs (Clonazepam) .... Take 1/2-1 By Mouth Every Six Hours As Needed Anxiety 18)  Doxycycline Hyclate 100 Mg Solr (Doxycycline Hyclate) .... Take 1 Tablet By Mouth Two Times A Day  Allergies (verified): 1)  ! Codeine 2)  ! Celebrex 3)  ! * Oxycodone/apap  Comments:  Nurse/Medical Assistant: The patient's medications and allergies were reviewed with the patient and were updated in the Medication and Allergy Lists. Bottles reviewed.  Review of Systems       No fevers, chills, hemoptysis, dysphagia, melena, hematocheezia, hematuria, rash, claudication, orthopnea, pnd, pedal edema. Denies any complications of the left wrist incision site. All other systems negative.   Vital Signs:  Patient profile:   77 year old female Height:      64 inches Weight:      138 pounds Pulse rate:   52 / minute BP sitting:   125 / 66  (left  arm) Cuff size:   regular  Vitals Entered By: Carlye Grippe (Jan 27, 2010 12:58 PM)   Physical Exam  Additional Exam:  GEN:77 year old female, sitting a right, in no distress HEENT: NCAT,PERRLA,EOMI NECK: palpable pulses, no bruits; no JVD; no TM LUNGS: CTA bilaterally HEART: RRR (S1S2); no significant murmurs; no rubs; no gallops ABD: soft, NT; intact BS JXB:JYNWGN left wrist incision site, with no hematoma. Palpable pulse. No lower extremity edema SKIN:  warm, dry MUSC: no obvious deformity NEURO: A/O (x3)     Impression & Recommendations:  Problem # 1:  CAD (ICD-414.00) patient has not had any recurrent chest discomfort, since undergoing recent hospitalization. cardiac catheterization revealed stable coronary anatomy, with severe stenosis of a small, nondominant RCA. Patient had previously failed PTCA of the RCA, and continued medical therapy was recommended. However, no new adjustments were made. Will continue current medications, but decrease metoprolol to one tablet b.i.d., in light of her noted postural lightheadedness, and current low resting heart rate of 52. Will decrease aspirin to 81 mg daily. Will plan return clinic visit with myself and Dr. Durenda Hurt in 3 months.  Problem # 2:  MIXED HYPERLIPIDEMIA (ICD-272.2) aggressive medical management recommended, with target LDL of 70 or less, if possible. We'll defer to Dr. Neita Carp, for ongoing monitoring and management.  Problem # 3:  ESSENTIAL HYPERTENSION, BENIGN (ICD-401.1) stable in her medications.  Patient Instructions: 1)  Your physician wants you to follow-up in: 3 months. You will receive a reminder letter in the mail one-two months in advance. If you don't receive a letter, please call our office to schedule the follow-up appointment. 2)  Decrease Aspirin to 81mg  by mouth once daily. 3)  Decrease Metoprolol to 1 tablet by mouth two times a day. Prescriptions: NITROSTAT 0.4 MG SUBL (NITROGLYCERIN) 1 tablet under tongue at onset of chest pain; you may repeat every 5 minutes for up to 3 doses.  #25 x 3   Entered by:   Cyril Loosen, RN, BSN   Authorized by:   Nelida Meuse, PA-C   Signed by:   Cyril Loosen, RN, BSN on 01/27/2010   Method used:   Electronically to        [facility] [address] (retail)       [address]       [place], [place]  [address]       Ph: [phone] or [phone]       Fax: [phone]   RxID:    [id number]
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
## 2010-10-20 ENCOUNTER — Encounter: Payer: Self-pay | Admitting: Cardiology

## 2010-10-20 NOTE — Assessment & Plan Note (Signed)
Summary: 6 M O FU PER FEB REMINDER-SRS   Visit Type:  Follow-up Primary Provider:  Dr. Fara Chute   History of Present Illness: Andrea Jackson presents for followup. She was last seen in August 2011.  Reports no progressive chest pain, only used nitroglycerin on one occasion since I saw her. She does describe an episode of syncope that occurred she was traveling in [place] a few months ago. She was singing in a church choir, also experiencing back pain at the time, became dizzy and passed out briefly. Description seems consistent with vasovagal syncope. She tells me she was hospitalized, had no evidence of heart attack, and underwent reassuring stress testing.  Medications are reviewed. She no longer takes Crestor, stopped this related to leg discomfort. She is taking omega-3 supplements and flaxseed oil. She reports recent labs Dr. Neita Carp a few weeks ago.  Preventive Screening-Counseling & Management  Alcohol-Tobacco     Smoking Status: never  Current Medications (verified): 1)  Aspir-Trin 325 Mg Tbec (Aspirin) .... Take 1/2 Tablet By Mouth Once A Day 2)  Nexium 40 Mg Cpdr (Esomeprazole Magnesium) .... Take 1 Tablet By Mouth Once A Day 3)  Ferrous Sulfate 325 (65 Fe) Mg  Tabs (Ferrous Sulfate) .... Take 1 Tablet By Mouth Two Times A Day 4)  Metoprolol Tartrate 25 Mg Tabs (Metoprolol Tartrate) .... Take 1/2 Tablet By Mouth Two Times A Day 5)  Diclofenac Sodium Andrea Mg Tbec (Diclofenac Sodium) .... Take 1 Tablet By Mouth Two Times A Day 6)  Fish Oil 1000 Mg Caps (Omega-3 Fatty Acids) .... Take 1 Tablet By Mouth Daily 7)  Lisinopril 40 Mg Tabs (Lisinopril) .... Take 1 Tablet By Mouth Once A Day As Needed 8)  Nitrostat 0.4 Mg Subl (Nitroglycerin) .Marland Kitchen.. 1 Tablet Under Tongue At Onset of Chest Pain; You May Repeat Every 5 Minutes For Up To 3 Doses. 9)  Levoxyl 100 Mcg Tabs (Levothyroxine Sodium) .... Take 1 Tablet By Mouth Once A Day 10)  Isosorbide Mononitrate Cr 30 Mg Xr24h-Tab  (Isosorbide Mononitrate) .... Take 1/2  Tablet By Mouth Once A Day 11)  Clonazepam 0.5 Mg Tabs (Clonazepam) .... Take 1/2-1 By Mouth Every Six Hours As Needed Anxiety 12)  Caltrate 600+d 600-400 Mg-Unit Tabs (Calcium Carbonate-Vitamin D) .... Take 1 Tablet By Mouth Once A Day 13)  Flax Seed Oil 1000 Mg Caps (Flaxseed (Linseed)) .... Take 1 Tablet By Mouth Once A Day [facility] 14)  Promethazine Hcl 25 Mg Tabs (Promethazine Hcl) .... Take 1 Tablet By Mouth Four Times A Day As Needed 15)  Prednisone (Pak) 10 Mg Tabs (Prednisone) .... Use As Directed  Allergies (verified): 1)  ! Codeine 2)  ! Celebrex 3)  ! * Oxycodone/apap  Comments:  Nurse/Medical Assistant: The patient's medication bottles and allergies were reviewed with the patient and were updated in the Medication and Allergy Lists.  Past History:  Past Medical History: Last updated: 09/18/2009 Arthritis CAD - NSTEMI 1/10, PTCA nondominant RCA 1/10, LVEF normal Hyperlipidemia Hypertension Hypothyroidism Small bowel ulcers on GIVENS capsule study 03/24/2009, multiple areas of ulceration, mid-distal SB, active bleeding distal SB Prometheus panel suggestive of Crohn's disease  Iron deficiency anemia Colonoscopy/EGD by Dr Jena Gauss 03/24/2009->benign bx, normal  Social History: Last updated: 07/31/2009 Widowed, lives w/ son 3 living children, 1 deceased Tobacco Use - No.  Alcohol Use - no Regular Exercise - yes Drug Use - no  Review of Systems  The patient denies anorexia, fever, weight loss, chest pain, dyspnea on exertion,  peripheral edema, prolonged cough, melena, and hematochezia.         Problems with chronic lower back pain and left hip pain. Otherwise reviewed and negative.  Vital Signs:  Patient profile:   And[age] year old female Height:      64 inches Weight:      135 pounds Pulse rate:   72 / minute BP sitting:   131 / Andrea  (left arm) Cuff size:   regular  Vitals Entered By: Carlye Grippe (October 12, 2010 1:52  PM)  Physical Exam  Additional Exam:  Normally nourished appearing Jackson in no acute distress. HEENT: Conjunctiva and lids normal, oropharynx with moist mucosa. Neck: Supple, no elevated JVP or bruits. Lungs: Clear to auscultation, nonlabored. Cardiac: Regular rate and rhythm, no S3 or pericardial rub. Abdomen: Soft, nontender, bowel sounds present. Skin: Warm and dry. Extremities: No pitting edema, distal pulses full. Musculoskeletal: No kyphosis. Neuropsychiatric: Alert and oriented x3, affect appropriate.    EKG  Procedure date:  10/12/2010  Findings:      Sinus rhythm at 72 beats per minute with left anterior fascicular block.  Prior Report Reviewed for Cardiac Cath:  Findings: 12/24/2009 PROCEDURAL FINDINGS:  Aortic pressure is 144/52 with a mean of 88, left   ventricular pressure is 149/3.      Left ventriculography shows normal LV function.  The LVEF is 55% by   visual estimate.  There is no mitral regurgitation.      Coronary angiography:  The left mainstem is free of significant disease.   It divides into the LAD and left circumflex.      LAD:  The LAD is a large vessel that wraps around the left ventricular   apex.  There is a mild plaque proximally with 20% to 30% stenosis   present.  There are 2 small diagonal branches present.  The first   diagonal, which is very small, has an 80% ostial lesion.  The second   diagonal has a 50% ostial lesion.  Both of these are 1 mm or smaller   vessels.      Left circumflex:  The left circumflex is dominant.  It supplies 2 large   OM branches, a left PDA branch, and 2 small left posterolateral   branches.  There is no obstructive disease throughout the course of the   left circumflex.  There are some minor irregularities in the midportion   of the vessel.      Right coronary artery:  This is a small, nondominant vessel.  There is   subtotal occlusion in the midportion of the vessel with a 99% stenosis.   There is TIMI  II flow distally.  This appearance is essentially   unchanged from the previous study.   Comments:    Impression & Recommendations:  Problem # 1:  CAD (ICD-414.00)  Symptomatically stable on medical therapy. No changes made today. Encouraged her to remain active as tolerated. Followup in 6 months, sooner if needed.  Her updated medication list for this problem includes:    Aspir-trin 325 Mg Tbec (Aspirin) .Marland Kitchen... Take 1/2 tablet by mouth once a day    Metoprolol Tartrate 25 Mg Tabs (Metoprolol tartrate) .Marland Kitchen... Take 1/2 tablet by mouth two times a day    Lisinopril 40 Mg Tabs (Lisinopril) .Marland Kitchen... Take 1 tablet by mouth once a day as needed    Nitrostat 0.4 Mg Subl (Nitroglycerin) .Marland Kitchen... 1 tablet under tongue at onset of chest pain; you may repeat every  5 minutes for up to 3 doses.    Isosorbide Mononitrate Cr 30 Mg Xr24h-tab (Isosorbide mononitrate) .Marland Kitchen... Take 1/2  tablet by mouth once a day  Orders: EKG w/ Interpretation (93000)  Problem # 2:  ESSENTIAL HYPERTENSION, BENIGN (ICD-401.1)  Blood pressure reasonable, continue present medications.  Her updated medication list for this problem includes:    Aspir-trin 325 Mg Tbec (Aspirin) .Marland Kitchen... Take 1/2 tablet by mouth once a day    Metoprolol Tartrate 25 Mg Tabs (Metoprolol tartrate) .Marland Kitchen... Take 1/2 tablet by mouth two times a day    Lisinopril 40 Mg Tabs (Lisinopril) .Marland Kitchen... Take 1 tablet by mouth once a day as needed  Problem # 3:  MIXED HYPERLIPIDEMIA (ICD-272.2)  Patient stopped Crestor concerned about side effects. She is comfortable on omega-3 supplements and flaxseed old. Plan to request recent lab work from Dr. Neita Carp for review.  The following medications were removed from the medication list:    Crestor 20 Mg Tabs (Rosuvastatin calcium) .Marland Kitchen... Take one tablet by mouth twice weekly  Patient Instructions: 1)  Your physician wants you to follow-up in: 6 months. You will receive a reminder letter in the mail one-two months in advance.  If you don't receive a letter, please call our office to schedule the follow-up appointment. 2)  Your physician recommends that you continue on your current medications as directed. Please refer to the Current Medication list given to you today. 3)  We will request labs from Dr. Neita Carp.

## 2010-11-09 ENCOUNTER — Encounter: Payer: Self-pay | Admitting: Cardiology

## 2010-11-10 DIAGNOSIS — R079 Chest pain, unspecified: Secondary | ICD-10-CM

## 2010-11-15 ENCOUNTER — Encounter: Payer: Self-pay | Admitting: Cardiology

## 2010-12-01 LAB — COMPREHENSIVE METABOLIC PANEL
AST: 20 U/L (ref 0–37)
Albumin: 2.9 g/dL — ABNORMAL LOW (ref 3.5–5.2)
Alkaline Phosphatase: 48 U/L (ref 39–117)
BUN: 13 mg/dL (ref 6–23)
Chloride: 107 mEq/L (ref 96–112)
GFR calc Af Amer: >60 mL/min (ref >60)
Potassium: 3.8 mEq/L (ref 3.5–5.1)
Total Bilirubin: 0.3 mg/dL (ref 0.3–1.2)

## 2010-12-01 LAB — LIPID PANEL
HDL: 55 mg/dL (ref >39)
Total CHOL/HDL Ratio: 2.6 RATIO
Triglycerides: 142 mg/dL (ref <150)
VLDL: 28 mg/dL (ref 0–40)

## 2010-12-01 LAB — TSH: TSH: 2.331 u[IU]/mL (ref 0.350–4.500)

## 2010-12-15 ENCOUNTER — Encounter: Payer: Self-pay | Admitting: Cardiology

## 2010-12-16 ENCOUNTER — Encounter: Payer: Self-pay | Admitting: Cardiology

## 2010-12-16 ENCOUNTER — Ambulatory Visit (INDEPENDENT_AMBULATORY_CARE_PROVIDER_SITE_OTHER): Payer: Medicaid Other | Admitting: Cardiology

## 2010-12-16 VITALS — BP 131/80 | HR 57 | Ht 64.0 in | Wt 136.0 lb

## 2010-12-16 DIAGNOSIS — I10 Essential (primary) hypertension: Secondary | ICD-10-CM

## 2010-12-16 DIAGNOSIS — I2510 Atherosclerotic heart disease of native coronary artery without angina pectoris: Secondary | ICD-10-CM

## 2010-12-16 NOTE — Progress Notes (Signed)
Clinical Summary Ms. Consalvo is a 77 y.o.female presenting for followup. She was seen in January of this year in the office. She was seen in consultation at [facility] back in February with atypical chest pain and normal cardiac markers. Feeling was that she may have had an upper respiratory tract infection.  Recent lab work shows BUN 18, creatinine 1.0, potassium 4.2, BNP 134, hemoglobin 11.0, platelets 322.    She reports no angina, no progressive shortness of breath or cough. She has been working in her garden.  Followup ECG is reviewed below. She reports compliance with her medications, no nitroglycerin use.  Allergies  Allergen Reactions  . Celecoxib     REACTION: hyper, couldn't eat or sleep  . Codeine     REACTION: itching  . Oxycodone-Acetaminophen     Current outpatient prescriptions:aspirin 325 MG tablet, Take 162 mg by mouth daily.  , Disp: , Rfl: ;  Calcium Carbonate-Vitamin D (CALTRATE 600+D) 600-400 MG-UNIT per tablet, Take 1 tablet by mouth daily.  , Disp: , Rfl: ;  clonazePAM (KLONOPIN) 0.5 MG tablet, Take 0.25-0.5 mg by mouth every 6 (six) hours. , Disp: , Rfl: ;  diclofenac (VOLTAREN) 75 MG EC tablet, Take 75 mg by mouth 2 (two) times daily.  , Disp: , Rfl:  esomeprazole (NEXIUM) 40 MG capsule, Take 40 mg by mouth daily before breakfast.  , Disp: , Rfl: ;  ferrous sulfate 325 (65 FE) MG tablet, Take 325 mg by mouth 2 (two) times daily.  , Disp: , Rfl: ;  Flaxseed, Linseed, (FLAX SEED OIL) 1000 MG CAPS, Take by mouth daily.  , Disp: , Rfl: ;  isosorbide mononitrate (IMDUR) 30 MG 24 hr tablet, Take 15 mg by mouth daily.  , Disp: , Rfl:  levothyroxine (SYNTHROID, LEVOTHROID) 100 MCG tablet, Take 100 mcg by mouth daily.  , Disp: , Rfl: ;  lisinopril (PRINIVIL,ZESTRIL) 40 MG tablet, Take 40 mg by mouth daily. , Disp: , Rfl: ;  metoprolol tartrate (LOPRESSOR) 25 MG tablet, Take 12.5 mg by mouth 2 (two) times daily.  , Disp: , Rfl: ;  nitroGLYCERIN (NITROSTAT) 0.4 MG SL tablet, Place 0.4  mg under the tongue every 5 (five) minutes as needed.  , Disp: , Rfl:  Omega-3 Fatty Acids (FISH OIL) 1200 MG CAPS, Take by mouth daily.  , Disp: , Rfl: ;  promethazine (PHENERGAN) 25 MG tablet, Take 12.5-25 mg by mouth every 6 (six) hours as needed. , Disp: , Rfl: ;  traMADol (ULTRAM) 50 MG tablet, Take 50 mg by mouth every 6 (six) hours as needed.  , Disp: , Rfl: ;  DISCONTD: Omega-3 Fatty Acids (FISH OIL) 1000 MG CAPS, Take by mouth daily.  , Disp: , Rfl:   Past Medical History  Diagnosis Date  . Hypertension   . Hyperlipidemia   . Coronary artery disease      NSTEMI 1/10, PTCA nondominant RCA 1/10, LVEF normal  . Arthritis   . Hypothyroidism   . Small bowel ulcers     GIVENS capsule study 03/24/2009, multiple areas of ulceration, mid-distal SB   . Iron deficiency anemia   . Crohn's disease     Prometheus panel suggestive     Social History Ms. Kluesner reports that she has never smoked. She has never used smokeless tobacco. Ms. Heitzenrater reports that she does not drink alcohol.  Review of Systems No fevers, chills, or unusual weight change. No chest pain, progressive shortness of breath, cough, hemoptysis, or wheezing. No palpitations,  dizziness, or syncope. No orthopnea, PND, or lower extremity edema. No focal motor weakness, memory problems, or speech deficits. Otherwise systems reviewed and negative except as already outlined.   Physical Examination Filed Vitals:   12/16/10 1342  BP: 131/80  Pulse: 57  Normally nourished appearing woman in no acute distress. HEENT: Conjunctiva and lids normal, oropharynx with moist mucosa. Neck: Supple, no elevated JVP or bruits. Lungs: Clear to auscultation, nonlabored. Cardiac: Regular rate and rhythm, no S3 or pericardial rub. Abdomen: Soft, nontender, bowel sounds present. Skin: Warm and dry. Extremities: No pitting edema, distal pulses full. Musculoskeletal: No kyphosis. Neuropsychiatric: Alert and oriented x3, affect  appropriate.   ECG Sinus bradycardia at 54 beats per minute, leftward axis.   Problem List and Plan

## 2010-12-16 NOTE — Patient Instructions (Signed)
Your physician wants you to follow-up in: 6 months. You will receive a reminder letter in the mail one-two months in advance. If you don't receive a letter, please call our office to schedule the follow-up appointment. Your physician recommends that you continue on your current medications as directed. Please refer to the Current Medication list given to you today. 

## 2010-12-16 NOTE — Assessment & Plan Note (Signed)
No reported angina. Continue medical therapy and observation. 

## 2010-12-16 NOTE — Assessment & Plan Note (Signed)
Blood pressure reasonably well controlled today. No changes made to medical regimen.

## 2010-12-18 LAB — BASIC METABOLIC PANEL
BUN: 11 mg/dL (ref 6–23)
Calcium: 9.1 mg/dL (ref 8.4–10.5)
Chloride: 102 mEq/L (ref 96–112)
Chloride: 103 mEq/L (ref 96–112)
Creatinine, Ser: 0.84 mg/dL (ref 0.4–1.2)
Creatinine, Ser: 0.93 mg/dL (ref 0.4–1.2)
GFR calc Af Amer: >60 mL/min (ref >60)
GFR calc non Af Amer: 59 mL/min — ABNORMAL LOW (ref >60)

## 2010-12-18 LAB — CARDIAC PANEL(CRET KIN+CKTOT+MB+TROPI)
CK, MB: 2 ng/mL (ref 0.3–4.0)
Relative Index: INVALID (ref 0.0–2.5)
Relative Index: INVALID (ref 0.0–2.5)
Relative Index: INVALID (ref 0.0–2.5)
Total CK: 63 U/L (ref 7–177)
Total CK: 70 U/L (ref 7–177)
Total CK: 86 U/L (ref 7–177)
Troponin I: 0.03 ng/mL (ref 0.00–0.06)
Troponin I: 0.03 ng/mL (ref 0.00–0.06)
Troponin I: 0.03 ng/mL (ref 0.00–0.06)

## 2010-12-18 LAB — HEPARIN LEVEL (UNFRACTIONATED): Heparin Unfractionated: 0.51 IU/mL (ref 0.30–0.70)

## 2010-12-18 LAB — COMPREHENSIVE METABOLIC PANEL
AST: 40 U/L — ABNORMAL HIGH (ref 0–37)
CO2: 31 mEq/L (ref 19–32)
Calcium: 8.8 mg/dL (ref 8.4–10.5)
Creatinine, Ser: 1.04 mg/dL (ref 0.4–1.2)
GFR calc Af Amer: >60 mL/min (ref >60)
GFR calc non Af Amer: 52 mL/min — ABNORMAL LOW (ref >60)
Glucose, Bld: 151 mg/dL — ABNORMAL HIGH (ref 70–99)
Sodium: 141 mEq/L (ref 135–145)
Total Protein: 5.3 g/dL — ABNORMAL LOW (ref 6.0–8.3)

## 2010-12-18 LAB — CBC
MCHC: 32.8 g/dL (ref 30.0–36.0)
MCHC: 32.8 g/dL (ref 30.0–36.0)
MCHC: 33 g/dL (ref 30.0–36.0)
MCV: 91.9 fL (ref 78.0–100.0)
MCV: 92.2 fL (ref 78.0–100.0)
Platelets: 167 10*3/uL (ref 150–400)
RBC: 3.91 MIL/uL (ref 3.87–5.11)
RBC: 4.07 MIL/uL (ref 3.87–5.11)
RDW: 13.9 % (ref 11.5–15.5)
RDW: 13.9 % (ref 11.5–15.5)
RDW: 13.9 % (ref 11.5–15.5)
WBC: 3.9 10*3/uL — ABNORMAL LOW (ref 4.0–10.5)
WBC: 4.4 10*3/uL (ref 4.0–10.5)

## 2010-12-18 LAB — PROTIME-INR: Prothrombin Time: 13 seconds (ref 11.6–15.2)

## 2010-12-18 LAB — TSH: TSH: 0.05 u[IU]/mL — ABNORMAL LOW (ref 0.350–4.500)

## 2010-12-18 LAB — LIPID PANEL
Cholesterol: 141 mg/dL (ref 0–200)
Total CHOL/HDL Ratio: 2.4 RATIO

## 2010-12-27 LAB — BASIC METABOLIC PANEL
BUN: 11 mg/dL (ref 6–23)
CO2: 25 mEq/L (ref 19–32)
CO2: 29 mEq/L (ref 19–32)
Calcium: 8.7 mg/dL (ref 8.4–10.5)
Chloride: 102 mEq/L (ref 96–112)
Chloride: 107 mEq/L (ref 96–112)
Chloride: 112 mEq/L (ref 96–112)
Creatinine, Ser: 0.75 mg/dL (ref 0.4–1.2)
GFR calc Af Amer: >60 mL/min (ref >60)
GFR calc Af Amer: >60 mL/min (ref >60)
GFR calc Af Amer: >60 mL/min (ref >60)
Glucose, Bld: 106 mg/dL — ABNORMAL HIGH (ref 70–99)
Potassium: 4.4 mEq/L (ref 3.5–5.1)
Potassium: 4.5 mEq/L (ref 3.5–5.1)
Sodium: 139 mEq/L (ref 135–145)
Sodium: 141 mEq/L (ref 135–145)
Sodium: 142 mEq/L (ref 135–145)

## 2010-12-27 LAB — CBC
HCT: 28.3 % — ABNORMAL LOW (ref 36.0–46.0)
HCT: 29.6 % — ABNORMAL LOW (ref 36.0–46.0)
HCT: 30.5 % — ABNORMAL LOW (ref 36.0–46.0)
HCT: 32.4 % — ABNORMAL LOW (ref 36.0–46.0)
Hemoglobin: 10.6 g/dL — ABNORMAL LOW (ref 12.0–15.0)
Hemoglobin: 11.1 g/dL — ABNORMAL LOW (ref 12.0–15.0)
Hemoglobin: 9.9 g/dL — ABNORMAL LOW (ref 12.0–15.0)
MCHC: 32.8 g/dL (ref 30.0–36.0)
MCHC: 33 g/dL (ref 30.0–36.0)
MCV: 94 fL (ref 78.0–100.0)
MCV: 94.6 fL (ref 78.0–100.0)
MCV: 94.7 fL (ref 78.0–100.0)
MCV: 95 fL (ref 78.0–100.0)
MCV: 95.1 fL (ref 78.0–100.0)
Platelets: 192 10*3/uL (ref 150–400)
Platelets: 192 10*3/uL (ref 150–400)
Platelets: 200 10*3/uL (ref 150–400)
Platelets: 205 10*3/uL (ref 150–400)
Platelets: 212 10*3/uL (ref 150–400)
Platelets: 233 K/uL (ref 150–400)
RBC: 2.74 MIL/uL — ABNORMAL LOW (ref 3.87–5.11)
RBC: 3.02 MIL/uL — ABNORMAL LOW (ref 3.87–5.11)
RBC: 3.12 MIL/uL — ABNORMAL LOW (ref 3.87–5.11)
RBC: 3.22 MIL/uL — ABNORMAL LOW (ref 3.87–5.11)
RBC: 3.41 MIL/uL — ABNORMAL LOW (ref 3.87–5.11)
RBC: 3.57 MIL/uL — ABNORMAL LOW (ref 3.87–5.11)
RDW: 13.6 % (ref 11.5–15.5)
RDW: 14.8 % (ref 11.5–15.5)
WBC: 3.1 10*3/uL — ABNORMAL LOW (ref 4.0–10.5)
WBC: 3.3 10*3/uL — ABNORMAL LOW (ref 4.0–10.5)
WBC: 3.5 10*3/uL — ABNORMAL LOW (ref 4.0–10.5)
WBC: 3.5 10*3/uL — ABNORMAL LOW (ref 4.0–10.5)
WBC: 3.6 10*3/uL — ABNORMAL LOW (ref 4.0–10.5)
WBC: 5.6 10*3/uL (ref 4.0–10.5)
WBC: 5.9 10*3/uL (ref 4.0–10.5)
WBC: 7.7 K/uL (ref 4.0–10.5)

## 2010-12-27 LAB — COMPREHENSIVE METABOLIC PANEL
AST: 18 U/L (ref 0–37)
Albumin: 2.5 g/dL — ABNORMAL LOW (ref 3.5–5.2)
Alkaline Phosphatase: 46 U/L (ref 39–117)
Chloride: 98 mEq/L (ref 96–112)
GFR calc Af Amer: >60 mL/min (ref >60)
Potassium: 4.4 mEq/L (ref 3.5–5.1)
Total Bilirubin: 0.6 mg/dL (ref 0.3–1.2)
Total Protein: 4.6 g/dL — ABNORMAL LOW (ref 6.0–8.3)

## 2010-12-27 LAB — BASIC METABOLIC PANEL WITH GFR
BUN: 13 mg/dL (ref 6–23)
CO2: 31 meq/L (ref 19–32)
Calcium: 8.2 mg/dL — ABNORMAL LOW (ref 8.4–10.5)
Chloride: 105 meq/L (ref 96–112)
Creatinine, Ser: 0.79 mg/dL (ref 0.4–1.2)
GFR calc non Af Amer: >60 mL/min
Glucose, Bld: 94 mg/dL (ref 70–99)
Potassium: 4.5 meq/L (ref 3.5–5.1)
Sodium: 140 meq/L (ref 135–145)

## 2010-12-27 LAB — HEPATIC FUNCTION PANEL
ALT: 22 U/L (ref 0–35)
AST: 26 U/L (ref 0–37)
Albumin: 2.8 g/dL — ABNORMAL LOW (ref 3.5–5.2)
Alkaline Phosphatase: 48 U/L (ref 39–117)
Bilirubin, Direct: 0.1 mg/dL (ref 0.0–0.3)
Indirect Bilirubin: 0.4 mg/dL (ref 0.3–0.9)
Total Bilirubin: 0.5 mg/dL (ref 0.3–1.2)
Total Protein: 4.8 g/dL — ABNORMAL LOW (ref 6.0–8.3)

## 2010-12-27 LAB — CARDIAC PANEL(CRET KIN+CKTOT+MB+TROPI)
CK, MB: 1.6 ng/mL (ref 0.3–4.0)
CK, MB: 18.2 ng/mL — ABNORMAL HIGH (ref 0.3–4.0)
CK, MB: 23.4 ng/mL — ABNORMAL HIGH (ref 0.3–4.0)
CK, MB: 27.9 ng/mL — ABNORMAL HIGH (ref 0.3–4.0)
CK, MB: 34.8 ng/mL — ABNORMAL HIGH (ref 0.3–4.0)
CK, MB: 41.4 ng/mL — ABNORMAL HIGH (ref 0.3–4.0)
Relative Index: 10.5 — ABNORMAL HIGH (ref 0.0–2.5)
Relative Index: 11 — ABNORMAL HIGH (ref 0.0–2.5)
Relative Index: 14.4 — ABNORMAL HIGH (ref 0.0–2.5)
Relative Index: 8.3 — ABNORMAL HIGH (ref 0.0–2.5)
Relative Index: 9.4 — ABNORMAL HIGH (ref 0.0–2.5)
Relative Index: INVALID (ref 0.0–2.5)
Relative Index: INVALID (ref 0.0–2.5)
Relative Index: INVALID (ref 0.0–2.5)
Relative Index: INVALID (ref 0.0–2.5)
Total CK: 162 U/L (ref 7–177)
Total CK: 220 U/L — ABNORMAL HIGH (ref 7–177)
Total CK: 333 U/L — ABNORMAL HIGH (ref 7–177)
Total CK: 36 U/L (ref 7–177)
Total CK: 37 U/L (ref 7–177)
Total CK: 375 U/L — ABNORMAL HIGH (ref 7–177)
Total CK: 49 U/L (ref 7–177)
Troponin I: 0.04 ng/mL (ref 0.00–0.06)
Troponin I: 0.07 ng/mL — ABNORMAL HIGH (ref 0.00–0.06)
Troponin I: 0.08 ng/mL — ABNORMAL HIGH (ref 0.00–0.06)
Troponin I: 5.4 ng/mL (ref 0.00–0.06)
Troponin I: 6.19 ng/mL (ref 0.00–0.06)

## 2010-12-27 LAB — LIPID PANEL
HDL: 78 mg/dL
Total CHOL/HDL Ratio: 3.1 ratio
Triglycerides: 133 mg/dL
VLDL: 27 mg/dL (ref 0–40)

## 2010-12-27 LAB — TYPE AND SCREEN: DAT, IgG: NEGATIVE

## 2010-12-27 LAB — TSH: TSH: 2.816 u[IU]/mL (ref 0.350–4.500)

## 2010-12-27 LAB — D-DIMER, QUANTITATIVE: D-Dimer, Quant: 0.25 ug/mL-FEU (ref 0.00–0.48)

## 2010-12-27 LAB — PROTIME-INR: INR: 0.9 (ref 0.00–1.49)

## 2010-12-27 LAB — HEPARIN LEVEL (UNFRACTIONATED)
Heparin Unfractionated: 0.47 IU/mL (ref 0.30–0.70)
Heparin Unfractionated: 0.68 IU/mL (ref 0.30–0.70)

## 2011-01-07 ENCOUNTER — Ambulatory Visit (INDEPENDENT_AMBULATORY_CARE_PROVIDER_SITE_OTHER): Payer: 59 | Admitting: Urgent Care

## 2011-01-07 ENCOUNTER — Encounter: Payer: Self-pay | Admitting: Urgent Care

## 2011-01-07 VITALS — BP 161/87 | HR 63 | Temp 97.9°F | Ht 64.0 in | Wt 135.4 lb

## 2011-01-07 DIAGNOSIS — K633 Ulcer of intestine: Secondary | ICD-10-CM

## 2011-01-07 DIAGNOSIS — D509 Iron deficiency anemia, unspecified: Secondary | ICD-10-CM

## 2011-01-07 DIAGNOSIS — Z791 Long term (current) use of non-steroidal anti-inflammatories (NSAID): Secondary | ICD-10-CM

## 2011-01-07 DIAGNOSIS — K922 Gastrointestinal hemorrhage, unspecified: Secondary | ICD-10-CM

## 2011-01-07 NOTE — Assessment & Plan Note (Signed)
See GIB °

## 2011-01-07 NOTE — Assessment & Plan Note (Addendum)
Multiple SB ulcers dx 2007.  See GIB. +Prometheus for Crohn's.  Ulcers felt sec to NSAIDs.  Review of chart & procedure reports show pt will be due for high risk screening colonoscopy 03/2011.

## 2011-01-07 NOTE — Assessment & Plan Note (Addendum)
Andrea Jackson has chronic GI bleeding secondary to SB ulcers & chronic NSAID use.  HGB now normal, but pt continues to be dependant on NSAIDs for athritic pain.  Also on concomitant ASA.  On Nexium 40mg  daily for gastric cytoprotection.  Next colonoscopy mid-July 2012.

## 2011-01-07 NOTE — Assessment & Plan Note (Signed)
See GI bleed.  

## 2011-01-07 NOTE — Progress Notes (Addendum)
Referring Provider: Juliette Alcide, [place] Primary Care Physician:  Estanislado Pandy, MD Primary Gastroenterologist:  Dr. Jena Gauss  Chief Complaint  Patient presents with  . Anemia    iron levels back to normal after iron supplement started    HPI:  Andrea Jackson is a 77 y.o. female here for follow up for chronic IDA secondary to SB ulcers felt to be secondary to chronic NSAID use.  Hgb 2/12 12.8.  Feeling ok.  C/o back pain & sciatica seeing Dr. Ethelene Hal. Occ gnawing lower abd pain-constant worse since started Nucynta.  Taking occ TUMS.  Taking Nexium 40mg  daily.  Denies heartburn or indigestion.  Occ nocturnal chest pain @4am , actively seeing Dr. Diona Browner in [place].  Occ nausea, denies vomiting.  BM normal without rectal bleeding or melena.  Occ constipation.  Occasional loud gas.   Past Medical History  Diagnosis Date  . Hypertension   . Hyperlipidemia   . Coronary artery disease      NSTEMI 1/10, PTCA nondominant RCA 1/10, LVEF normal  . Arthritis   . Hypothyroidism   . Small bowel ulcers     GIVENS capsule study 03/24/2006, multiple areas of ulceration, mid-distal SB , Prometheus panel suggested Crohn's  . Iron deficiency anemia     sec chronic SB GI bleeding ulcers & chronic NSAIDs  . S/P colonoscopy 03/24/06    benign-Dr Rourk  . S/P endoscopy 03/24/06    benign-Dr. Jena Gauss     Past Surgical History  Procedure Date  . Appendectomy   . Cholecystectomy   . Total abdominal hysterectomy   . Tonsillectomy   . Nose surgery   . Hand surgery   . Cataract extraction     Current Outpatient Prescriptions  Medication Sig Dispense Refill  . aspirin 325 MG tablet Take 162 mg by mouth daily.        . Calcium Carbonate-Vitamin D (CALTRATE 600+D) 600-400 MG-UNIT per tablet Take 1 tablet by mouth daily.        . clonazePAM (KLONOPIN) 0.5 MG tablet Take 0.25-0.5 mg by mouth every 6 (six) hours.       . diclofenac (VOLTAREN) 75 MG EC tablet Take 75 mg by mouth 2 (two) times daily.        Marland Kitchen  esomeprazole (NEXIUM) 40 MG capsule Take 40 mg by mouth daily before breakfast.        . ferrous sulfate 325 (65 FE) MG tablet Take 325 mg by mouth 2 (two) times daily.        . Flaxseed, Linseed, (FLAX SEED OIL) 1000 MG CAPS Take by mouth daily.        . isosorbide mononitrate (IMDUR) 30 MG 24 hr tablet Take 15 mg by mouth daily.        Marland Kitchen levothyroxine (SYNTHROID, LEVOTHROID) 100 MCG tablet Take 100 mcg by mouth daily.        Marland Kitchen lisinopril (PRINIVIL,ZESTRIL) 40 MG tablet Take 40 mg by mouth daily.       . metoprolol tartrate (LOPRESSOR) 25 MG tablet Take 12.5 mg by mouth 2 (two) times daily.        . nitroGLYCERIN (NITROSTAT) 0.4 MG SL tablet Place 0.4 mg under the tongue every 5 (five) minutes as needed.        . Omega-3 Fatty Acids (FISH OIL) 1200 MG CAPS Take by mouth daily.        . promethazine (PHENERGAN) 25 MG tablet Take 12.5-25 mg by mouth every 6 (six) hours as needed.       Marland Kitchen  tapentadol (NUCYNTA) 50 MG TABS Take 50 mg by mouth 3 (three) times daily.        . traMADol (ULTRAM) 50 MG tablet Take 50 mg by mouth every 6 (six) hours as needed.          Allergies as of 01/07/2011 - Review Complete 01/07/2011  Allergen Reaction Noted  . Celecoxib    . Codeine    . Oxycodone-acetaminophen  12/15/2010    Family History  Problem Relation Age of Onset  . Cancer Father   . Diabetes Mother   . Cancer Son     History   Social History  . Marital Status: Widowed    Spouse Name: N/A    Number of Children: 4  . Years of Education: N/A    Social History Main Topics  . Smoking status: Never Smoker   . Smokeless tobacco: Never Used  . Alcohol Use: No  . Drug Use: No  . Sexually Active: No   Review of Systems: Gen: Denies any fever, chills, sweats, anorexia, fatigue, weakness, malaise, weight loss, and sleep disorder CV: Denies chest pain, angina, palpitations, syncope, orthopnea, PND, peripheral edema, and claudication. Resp: Denies dyspnea at rest, dyspnea with exercise, cough,  sputum, wheezing, coughing up blood, and pleurisy. GI: Denies vomiting blood, jaundice, and fecal incontinence.   Denies dysphagia or odynophagia. Derm: Denies rash, itching, dry skin, hives, moles, warts, or unhealing ulcers.  Psych: Denies depression, anxiety, memory loss, suicidal ideation, hallucinations, paranoia, and confusion. Heme: Denies bruising, bleeding, and enlarged lymph nodes.  Physical Exam: BP 161/87  Pulse 63  Temp 97.9 F (36.6 C)  Ht 5\' 4"  (1.626 m)  Wt 135 lb 6.4 oz (61.417 kg)  BMI 23.24 kg/m2 General:   Alert,  Well-developed, well-nourished, pleasant and cooperative in NAD Head:  Normocephalic and atraumatic. Eyes:  Sclera clear, no icterus.   Conjunctiva pink. Mouth:  No deformity or lesions, dentition normal. Neck:  Supple; no masses or thyromegaly. Heart:  Regular rate and rhythm; no murmurs, clicks, rubs,  or gallops. Abdomen:  Soft, nontender and nondistended. Multiple well-healed old scars.  No masses, hepatosplenomegaly or hernias noted. Normal bowel sounds, without guarding, and without rebound.   Msk:  Symmetrical without gross deformities. Normal posture. Pulses:  Normal pulses noted. Extremities:  Without clubbing or edema.  Neurologic:  Alert and  oriented x4;  grossly normal neurologically. Skin:  Intact without significant lesions or rashes. Few ecchymosis noted. Cervical Nodes:  No significant cervical adenopathy. Psych:  Alert and cooperative. Normal mood and affect.

## 2011-01-07 NOTE — Patient Instructions (Addendum)
Nexium 40mg  every day Colonoscopy 03/2011

## 2011-01-08 ENCOUNTER — Encounter: Payer: Self-pay | Admitting: Urgent Care

## 2011-01-10 NOTE — Progress Notes (Signed)
Cc to PCP 

## 2011-01-25 NOTE — Cardiovascular Report (Signed)
NAMESHERION, DOOLY                 ACCOUNT NO.:  [id number]   MEDICAL RECORD NO.:  [id number]          PATIENT TYPE:  INP   LOCATION:  2502                         FACILITY:  [facility]   PHYSICIAN:  Arturo Morton. Riley Kill, MD, FACCDATE OF BIRTH:  [birth year]   DATE OF PROCEDURE:  10/06/2008  DATE OF DISCHARGE:  10/08/2008                            CARDIAC CATHETERIZATION   INDICATIONS:  Andrea Jackson is a very nice 77 year old woman who presents  with recurrent chest discomfort and positive enzymes.  She recently  underwent diagnostic catheterization on September 29, 2008.  At that time,  she did not have critical disease in the LAD system.  She had a high-  grade subtotal stenosis of the nondominant right coronary.  Given the  size, and the fact that her LV function was preserved and her pain was  prolonged, we felt that she should be treated medically.  Dr. Excell Seltzer and  I reviewed the films, and were felt that this was the best approach  given the small size of the vessel.  Moreover, the vessel was only about  1.5 mm, and was felt not to be ideal for stenting.  As a result, she was  treated conservatively.  She is now brought back because of recurrent  symptoms.  Current study was done to assess coronary anatomy.   PROCEDURES:  1. Left heart catheterization.  2. Selective coronary arteriography.  3. Percutaneous angioplasty of the nondominant right coronary.   DESCRIPTION OF THE PROCEDURE:  The patient was brought to the lab and  prepped and draped in the usual fashion.  Through an anterior puncture,  the femoral artery was entered and a 6-French sheath was placed.  Diagnostic views of the left were obtained.  Views of the right were  obtained.  The vessel was basically nearly occluded or totally occluded.  Because of her recurrent symptomatology, it was felt that we should try  to open this artery to prevent recurrent symptoms.  She was agreeable to  proceed.  Central aortic and left  ventricular pressures were measured.  We used a bolus and drip of bivalirudin for anticoagulation.  A JR-4  guiding catheter with side holes was utilized.  Intracoronary nitro was  administered.  A Prowater guidewire was advanced down the artery after  an appropriate ACT had been achieved.  A 1.5 x 12 Apex balloon was  inflated for approximately 5 atmospheres for 60 seconds.  Repeat  dilatations were performed with this balloon.  We did several inflations  at 8 atmospheres for up to 3 minutes.  We elected not to upgrade the  balloon, and not to stent the artery due to the very small size of the  vessel.  There was improvement in flow in the artery and there were no  major complications.  All catheters were subsequently removed and the  femoral sheath was sewn into place.  She was taken to the holding area  in satisfactory clinical condition.   HEMODYNAMIC DATA:  1. Central aortic pressure was 140/67.  2. LV pressure was 133/5.  3. There was not a gradient  on pullback across aortic valve.   ANGIOGRAPHIC DATA:  1. Ventriculography was done in the RAO projection, and overall LV      function appeared to be preserved.  2. The left main was free of critical disease.  3. The LAD has some calcification.  There is perhaps 20-30% narrowing      in the midvessel and 30-40% narrowing more distally; however, high-      grade disease is not noted.  There is a very small diagonal branch      that is less than a millimeter and a half in caliber, and with 80%      ostial narrowing.  4. The circumflex provides several marginal branches and then courses      posteriorly where it provides essentially a posterior descending      branch.  There is some mild luminal irregularity, but no critical      stenosis as noted on the previous catheterization.  5. The right coronary artery is essentially occluded.  Proximally, the      vessel is calcified.  Following balloon dilatation and multiple       inflations, the total occlusion was reduced to about 30-40% luminal      reduction.  There is also narrowing of about 40-50% narrowing just      prior to bifurcation.  As noted, this is a nondominant vessel and      relatively small.   CONCLUSIONS:  1. Preserved overall left ventricular function.  2. No critical disease involving the left coronary system.  3. Successful percutaneous angioplasty only of the nondominant right      coronary artery.   DISPOSITION:  Currently, the patient will be treated medically.  We will  continue aspirin and Plavix for a few days, but eventually Plavix can be  discontinued.      Arturo Morton. Riley Kill, MD, [facility]  Electronically Signed     TDS/MEDQ  D:  10/10/2008  T:  10/11/2008  Job:  [id number]   cc:   Guerry Bruin, MD, [facility]  CV Laboratory

## 2011-01-25 NOTE — H&P (Signed)
[name], Andrea Jackson                 ACCOUNT NO.:  [id number]   MEDICAL RECORD NO.:  [id number]          PATIENT TYPE:  INP   LOCATION:  2920                         FACILITY:  [facility]   PHYSICIAN:  Gerrit Friends. Dietrich Pates, MD, FACCDATE OF BIRTH:  11-17-1933   DATE OF ADMISSION:  09/28/2008  DATE OF DISCHARGE:                              HISTORY & PHYSICAL      Tereso Newcomer, PA-C      Gerrit Friends. Dietrich Pates, MD, [facility]  Electronically Signed    SW/MEDQ  D:  09/28/2008  T:  09/29/2008  Job:  [id number]

## 2011-01-25 NOTE — Assessment & Plan Note (Signed)
NAMEMarland Kitchen  Andrea Jackson, Andrea Jackson                  CHART#:  [id number]   DATE:  02/15/2008                       DOB:  September 25, 1933   FOLLOWUP:  Iron-deficiency anemia, history of small bowel ulcer seen on  capsule study, negative EGD and colonoscopy.  Andrea Jackson was last seen  on May 15, 2007.  She was supposed to return in 3 months later but  did not.  She says she has done well.  She is taking upon herself to  drop back on Entocort to one 3 mg tablet daily and about once a week she  takes 2.  She cannot tell that this agent has any impact on her GI tract  or her arthritis at this point in time.  She has 1-2 bowel movements  daily.  She denies diarrhea, melena, or hematochezia.  Weight is  actually up 4 pounds from May 15, 2007.  She continues to take  Nexium 40 grams orally daily and again takes diclofenac daily as well as  an iron supplement.   She had a normal bone density study 1 year ago.  She is to see Dr.  Neita Carp in a couple of weeks and blood work is going to be drawn.  From August 24, 2007, the last labs we have, her H&H was 10.9 and 35.6  with MCV 92.  She had H&H 12.4 and 42.4 on May 03, 2007.   MEDICATIONS:  See updated list.   ALLERGIES:  CODEINE and CELEBREX.   PHYSICAL EXAMINATION:  GENERAL:  Pleasant 73-year lady resting  comfortably.  VITAL SIGNS:  Weight 134, height 5 feet 4, temperature 98, BP 130/88,  and pulse 68.  ABDOMEN:  Flat, positive bowel sounds, soft, entirely nontender without  appreciable mass or hepatosplenomegaly.   ASSESSMENT:  Iron deficiency anemia almost certainly related to oozing  from small bowel ulcers, which at this point, I feel are more likely  related to diclofenac than inflammatory bowel disease, although the two  could be coexistent.   I doubt ischemia with no pain.   The Prometheus antibody panel did suggest Crohn disease; however, that  assay is often unreliable.  I am hard-pressed to keep this lady on  Entocort at this point,  because it really has not made overall  clinically any difference in her symptoms.  We pretty much stuck with  her using diclofenac because of her debilitating arthritis, and she does  not want to consider coming off of it.  We will arrange for her to have  CBC when she gets labs with Dr. Neita Carp.  We will review labs  when they become available and then I will make further recommendations  in regards to further Entocort therapy and followup.       Jonathon Bellows, M.D.  Electronically Signed     RMR/MEDQ  D:  02/15/2008  T:  02/15/2008  Job:  [id number]   cc:   Andrea Jackson
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
## 2011-01-25 NOTE — Discharge Summary (Signed)
NAMEAMANDAJO, GONDER                 ACCOUNT NO.:  [id number]   MEDICAL RECORD NO.:  [id number]          PATIENT TYPE:  INP   LOCATION:  2502                         FACILITY:  [facility]   PHYSICIAN:  Arturo Morton. Riley Kill, MD, FACCDATE OF BIRTH:  1933/09/14   DATE OF ADMISSION:  10/04/2008  DATE OF DISCHARGE:  10/08/2008                               DISCHARGE SUMMARY   PRIMARY CARDIOLOGIST:  Jonelle Sidle, MD.   PRIMARY MEDICAL DOCTOR:  Dr. Fara Chute.   DISCHARGE DIAGNOSIS:  Non-ST-elevation myocardial infarction.   SECONDARY DIAGNOSES:  1. Anemia.  2. Hypertension.  3. Osteoarthritis.  4. Status post open reduction and internal fixation, right lower      extremity.  5. Status post cholecystectomy.  6. Status post appendectomy.  7. Status post hysterectomy.  8. Status post nasal surgery.  9. Status post hand surgery.  10.Status post tonsillectomy.  11.Status post thyroidectomy.   ALLERGIES:  1. CODEINE.  2. CEREBYX.  3. PERCOCET.  4. HYDROCODONE.   PROCEDURES PERFORMED DURING THIS HOSPITALIZATION:  1. On October 04, 2008, the patient had an EKG showing sinus      bradycardia of 49 bpm and with a left anterior fascicular block.  2. On October 07, 2008, the patient had an EKG showing no significant      change from EKG performed on October 04, 2008.  3. On October 06, 2008, the patient had a left heart cath, SCA, PTCA      only of RCA, and then nondominant.  Results were reduction of the      100% proximal stenosis to 40%.  There was no change seen in the      left circumflex or left anterior descending artery nor in the LV      gram.   HISTORY OF PRESENT ILLNESS:  This is a 77 year old white female who is  admitted to [facility] the week prior to this admission with a  small non-ST-elevation myocardial infarction.  On cardiac cath, she had  a subtotally occluded small nondominant RCA and other small vessel  disease, which was not optimal for PCI.  However, on  October 03, 2008,  the patient began to experience severe chest pressure.  The patient  reports the feeling like an elephant sitting on her chest.  The  patient went to the emergency department in [place], was admitted  overnight, and had ongoing intermittent chest pain, nausea and headache.  Her pain did not significantly improve with nitroglycerin.  Her initial  troponin was 0.3 at [facility] and has trended down to 0.1 at the time of her  admission to [facility] and at the time of admission she was also pain  free except for severe headache which she attributes to the  nitroglycerin paste.   HOSPITAL COURSE:  The patient was admitted and monitored to determine  whether or not she was having another non-STEMI and while at the time of  admission she was pain free.  Her pain did return on the evening of  October 05, 2008.  Chest pain was 8/10 and was  relieved with nitro  somewhat to 5/10.  Her vital signs remained stable at that time.  Her  enzymes were being monitored at this time and on October 06, 2008, in  the a.m. she had elevated troponin-I to 0.65 and an elevated CK to CK-MB  with a relative index of 14.4.  Due to the patient's recurrent chest  pain and up trending cardiac enzymes, the patient was scheduled for  cardiac catheterization on October 06, 2008, as described in the  procedures section.  The patient tolerated the procedure well and had no  significant complications other than a mild decrease in her hemoglobin  from 11.1 to 8.5.  The patient's hemoglobin when last checked at  discharge was 9.4.  The patient had minimal symptoms of anemia described  by the patient as lightheadedness with sitting or standing quickly or  with ambulation.  While the patient vital signs remained mostly stable  during her hospital course, there was a minimal elevation in her  systolic blood pressure from low 100s to 150s.  Vital signs at the time  of discharge were blood pressure 151/66, pulse 52,  respirations 15, O2  sat is 100% on room air, and temperature 97.5 degrees Fahrenheit.   DISCHARGE LABS:  WBC 3.3, hemoglobin 9.4, hematocrit 28.3, Plt count  192.  BMET completed on October 07, 2008, sodium 141, potassium 4.4,  chloride 112, CO2 of 25, BUN 14, creatinine 0.75, glucose 101, calcium  7.4.  Her cardiac enzymes down trending CK 333, CK-MB 34.8, relative  index 10.5, troponin-I of 6.19.   FOLLOWUP APPOINTMENTS:  The patient is scheduled to follow up with her  primary cardiologist in [place] on October 21, 2008, at 2:15 p.m.  The  patient was made aware of this follow up appointment as well as all  discharge instructions including her medication list both orally and in  written form.   DISCHARGE MEDICATIONS:  1. Aspirin 325 mg p.o. daily.  2. Ferrous sulfate 325 mg p.o. daily.  3. Zocor 40 mg p.o. daily.  4. Metoprolol tartrate 25 mg p.o. b.i.d.  5. Nitroglycerin 0.4 mg sublingually p.r.n. for chest pain.  6. Protonix 40 mg p.o. daily.  7. Entocort 3 mg p.o. daily.  8. Synthroid 0.75 mg p.o. daily.  9. Diclofenac 25 mg p.o. daily.  10.Fish oil p.o. daily.  11.Vitamin C p.o. daily.  12.Vitamin B12 p.o. daily.  13.Premarin 0.3 mg p.o. every other day.   Duration of the discharge encounter including physician time is 45  minutes.      Jarrett Ables, PAC      Arturo Morton. Riley Kill, MD, [facility]  Electronically Signed    MS/MEDQ  D:  10/08/2008  T:  10/09/2008  Job:  [id number]   cc:   Jonelle Sidle, MD  Fara Chute
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
## 2011-01-25 NOTE — Cardiovascular Report (Signed)
Andrea Jackson, Andrea Jackson                 ACCOUNT NO.:  [id number]   MEDICAL RECORD NO.:  [id number]          PATIENT TYPE:  INP   LOCATION:  2901                         FACILITY:  [facility]   PHYSICIAN:  Andrea Morton. Riley Kill, MD, FACCDATE OF BIRTH:  October 25, 1933   DATE OF PROCEDURE:  05/06/2009  DATE OF DISCHARGE:                            CARDIAC CATHETERIZATION   INDICATIONS:  [name] is well-known to me.  She is a 77 year old who  has had a previous GI bleed.  She also is on nonsteroidal anti-  inflammatory drugs.  The patient previously presented with a small non-  ST elevation myocardial infarction, and had a high-grade very tiny  nondominant right coronary artery.  She was initially treated  conservatively but developed recurrent chest pain, was brought back to  the lab and a simple balloon angioplasty was performed.  It was felt to  be not large enough for even a 2-mm nondrug-eluting stent.  It did not  supply significant inferior myocardium and the LV function was normal at  that time.  She had improvement in her symptoms but has had recurrence  of symptoms over the past few months.  Importantly, she backed down on  her Imdur because of some heel discomfort.  Moreover, she has had  significant stress during the week and her symptoms flared up earlier.  It was not felt, looking at the size of the vessel that this would be  picked up on a myocardial perfusion imaging study.  She has been  eventually transferred to [facility] for repeat catheterization.  Risks,  benefits, and alternatives were discussed with the patient.  She  consented to proceed.   PROCEDURES:  1. Left heart catheterization.  2. Selective coronary arteriography.  3. Selective left ventriculography.   DESCRIPTION OF PROCEDURE:  The procedure was performed from the right  femoral artery using 5-French catheters.  She tolerated this without  complication.  I then spoke with her daughter as well as her son on the  phone.  We elected to recommended an another attempt at medical therapy  given the anatomic findings.  The ongoing strategy was that if this was  not controlled then a repeat balloon angioplasty could be performed, but  importantly the risk of target vessel revascularization would be  extraordinarily high, and her survival potential would not be improved  by an attempted PCI at this point.   HEMODYNAMIC DATA:  1. The central aortic pressure was 152/70, mean 104.  2. Left ventricular pressure 152/11.  3. No gradient or pullback across the aortic valve.   ANGIOGRAPHIC DATA:  1. Ventriculography in the RAO projection reveals vigorous global      systolic function without segmental wall motion abnormality.  2. The left main is free of critical disease.  3. The LAD has about 30-40% area of focal narrowing in the proximal      mid vessel.  There is about 20-30% narrowing after the takeoff of      the diagonal and some diffuse small-vessel plaquing in the mid-to-      distal LAD, perhaps 40%.  There is a tiny diagonal which has a 90%      ostial stenosis, but this vessel was less than 1 mm in size.  4. The circumflex is a large dominant vessel.  There are two marginals      which come out from the midvessel.  The superior branch has about      40% ostial narrowing best seen in the LAO cranial views.  None of      this appears to be extraordinarily tight.  The inferior segment is      supplied by a posterior descending artery with a small subbranch,      and no critical stenosis is noted here.  5. The right coronary artery is a very small nondominant vessel,      estimated vessel size would be in the range of about 1.5 mm.  There      is subtotal occlusion with TIMI I flow in this location.  The      distal vessel is very small in distribution.   CONCLUSIONS:  1. Normal left ventricular function.  2. Scattered lesions that are noncritical involving the left coronary      system.  3.  Subtotal occlusion with TIMI 1 flow with a small nondominant right      coronary artery.   DISPOSITION:  After a thorough discussion with the family, I felt that  we should continue another attempt at medical therapy.  While the vessel  could be opened, it is really small, there is TIMI 1 flow with previous  TIMI 0 flow at the last intervention, and also the patient has a vessel  in which a 2-mm stent would likely be too large.  A nondrug-eluting 2-mm  stent would have a high rate of target vessel revascularization.  It  clearly is too small for currently improved drug-eluting stent, and she  is a high-risk patient for this anyway because of previous GI bleed.  Therefore, we will increase her beta-blockers.  Her heart rate is in the  70s-80s and the blood pressure is in the 150 range.  If she fails to  improve on medical therapy, then we can continue to attempt a repeat  angioplasty, but the likelihood of a long-term vessel patency is  relatively low given the vessel size.      Andrea Morton. Riley Kill, MD, [facility]  Electronically Signed     TDS/MEDQ  D:  05/06/2009  T:  05/07/2009  Job:  [id number]   cc:   Suanne Marker, MD
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
## 2011-01-25 NOTE — Cardiovascular Report (Signed)
NAMESHANESE, RIEMENSCHNEIDER                 ACCOUNT NO.:  [id number]   MEDICAL RECORD NO.:  [id number]           PATIENT TYPE:   LOCATION:                                 FACILITY:   PHYSICIAN:  Arturo Morton. Riley Kill, MD, FACCDATE OF BIRTH:  [birth year]   DATE OF PROCEDURE:  09/29/2008  DATE OF DISCHARGE:                            CARDIAC CATHETERIZATION   INDICATIONS:  Ms. Schwarz is a 77 year old who presents with positive  enzymes.  She has no high-grade risk factors.  She is mildly  hypothyroid.  She has had an iron-deficiency anemia and has been  followed by Dr. Jena Gauss.  It was thought to be due to small bowel ulcers.  Her last transfusion was in 2007.  She has tolerated aspirin.  She has  been on diclofenac for disabling arthritis.  The current study was done  to assess coronary anatomy.   PROCEDURES:  1. Left heart catheterization.  2. Selective coronary arteriography.  3. Selective left ventriculography.   DESCRIPTION OF THE PROCEDURE:  The patient was brought to the  catheterization laboratory and prepped and draped in the usual fashion.  Through an anterior puncture, the femoral artery was entered.  A 6-  French sheath was then placed.  Views of left and right coronaries were  obtained in multiple angiographic projections.  Central aortic and left  ventricular pressures were measured with pigtail.  Ventriculography was  then performed in the RAO projection.  There were no complications.  The  patient was taken to the holding area in satisfactory clinical  condition.  We did discuss the case with Dr. Excell Seltzer over the phone.  I  then discussed the case with the patient's daughter and the patient.  There were no major complications.   HEMODYNAMIC DATA:  1. The initial central aortic pressure was 130/66.  2. Left ventricular pressure 133/10.  3. There was no gradient or pullback across the aortic valve.   ANGIOGRAPHIC DATA:  1. Left main is free of critical disease.  2. Left anterior  descending artery has minimal luminal irregularities      proximally.  It is less than 20%.  There is a small diagonal      branch, which comes off in the midvessel, has about an 80% ostial      stenosis, but does not appear to be acute.  The remainder of the      LAD demonstrates minor luminal irregularity all the way down to the      apical tip.  3. The circumflex is a dominant vessel.  There was a first small      marginal and a second marginal with about 40-50% ostial plaque.      The third marginal was a large-caliber vessel without significant      focal narrowing.  The fourth vessel was a posterolateral branch,      and the fifth vessel was a posterior descending branch arising from      the circumflex.  4. The right coronary is a nondominant vessel.  There is a tiny      inferior  branch, which does not supply significant myocardium.      There is an RV branch as well.  Just proximal to this is a 95% hazy      stenosis, followed by about a 70% area of focal stenosis.  The      vessel itself is about 1.5-2.0 mm in size.  5. The ventriculogram demonstrates preserved global systolic function      with ejection fraction in excess of 65%.   CONCLUSIONS:  1. Well-preserved left ventricular function.  2. An 80% small diagonal stenosis as noted above.  3. Subtotal occlusion of a small nondominant right coronary artery.   DISPOSITION:  The patient is currently stable.  She had chest pain for  greater than 24 hours.  She has not had recurrent symptoms.  This likely  represents an occluded vessel with recanalization.  It has a small  distribution distally.  A stent would be maximum of 2 mm.  There is not  a significant inferior presence of the vessel.  Given her history of  iron-deficiency anemia, Dr. Excell Seltzer and I have discussed her case, and  our leaning would be in the direction of continued medical therapy.  If  she would have recurrent symptoms, it might be worth considering   dilatation, but the upside probably is not accounted for by the downside  at this point in time.      Arturo Morton. Riley Kill, MD, [facility]  Electronically Signed     TDS/MEDQ  D:  09/29/2008  T:  09/30/2008  Job:  [id number]
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
## 2011-01-25 NOTE — Assessment & Plan Note (Signed)
NAMEMarland Kitchen  CORENA, TILSON                  CHART#:  [id number]   DATE:  05/15/2007                       DOB:  [birth year]   Follow-up history of iron deficiency anemia.  Small bowel ulcers seen on  Givens capsule study.  Negative for EGD and colonoscopy.  On diclofenac  therapy.  The patient returns.  She was last seen here in January.   She continues taking diclofenac.  If she does not she is debilitated  with osteoarthritis.  She has been on 6 mg of Entocort daily up until  one month ago when she ran out.  She has had normal bowel function, one  stool daily.  Stool is chronically dark on iron but really has not had  any problems.  As a matter of fact she says her arthritis was really  better when she was taking Entocort, which is not entirely surprising.  From 05/03/2007 her white count was found to be slightly depressed at  3.0, H&H at 12.4 and 42.4, platelet count 257,000.  She is seeing an  orthopedist about getting one of the joints in her fingers replaced.  Reflex symptoms well controlled on Nexium 40 mg orally daily.  Current  medications otherwise see updated list.   ALLERGIES:  Codeine and Celebrex.   EXAMINATION:  She looks well groomed and pleasant in no acute distress.  Weight 130 which is down 5-1/2 pounds, height 5-foot-4, temp 97.5, BP  110/78, pulse 60.  CHEST:  Lungs clear to auscultation.  CARDIAC:  Regular rate and rhythm without murmur, gallop or rub.  ABDOMEN:  Flat, positive bowel sounds, soft, nontender without  appreciable mass or organomegaly.   ASSESSMENT:  History of iron deficiency anemia.  History of multiple  small bowel ulcers in the setting of diclofenac.  She has a positive  Prometheus antibody suggestive of Crohn's disease; however, I am not at  all convinced that she has Crohn's disease, although she has experienced  clinical improvement on Entocort.   She certainly could have underlying quiescent Crohn's disease  exacerbated by diclofenac therapy or  the small bowel ulcers could be  entirely due to diclofenac therapy and the Prometheus antibody panel is  simply a false positive.  I certainly would like to see her come off  nonsteroidal agents but that is just not feasible.  Having said that I  have recommended to the patient that she continue on Entocort at a low  does i.e. 6 mg daily for the next three months.  We will check a CBC in  three months.  Entocort also seems to make her arthritis better and I  have asked her as much as possible to minimize the use of diclofenac.  We will plan to see this nice lady back in three months and go from  there.  If she remains on Entocort for an extended period of time we  will consider repeat bone density __________ study in one year.  We  talked  about the side effects of Prometheus as they are similar to prednisone,  but not likely to be as severe with a significant first pass effect.       Jonathon Bellows, M.D.  Electronically Signed     RMR/MEDQ  D:  05/15/2007  T:  05/15/2007  Job:  [id number]   cc:  Fara Chute

## 2011-01-25 NOTE — Assessment & Plan Note (Signed)
[facility]                          [facility]ICE NOTE   DARBY, FLEEMAN                        MRN:          [id number]  DATE:01/12/2009                            DOB:          06-10-1934    PRIMARY CARE PHYSICIAN:  Dr. Fara Chute.   REASON FOR VISIT:  Cardiac followup.   HISTORY OF PRESENT ILLNESS:  I saw Ms. Blinder back in February as a post-  hospital followup after coronary intervention, specifically balloon  angioplasty to a subtotally occluded small nondominant right coronary  artery in the setting of recurrent chest pain following a non-ST-  elevation myocardial infarction.  She has a residual 80% small diagonal  stenosis, which is being managed medically and otherwise no major  obstructive disease in the larger epicardial vessels with normal  ejection fraction.  She has a number of other active health issues at  this time, which are being investigated including degenerative joint  disease in the spine and also a pelvic cyst.  She reports recent raw  sensation in her chest with shortness of breath and some congestion as  well as mild cough that has been nonproductive.  She has had no fevers.  She states that these symptoms remind her somewhat of her prior angina,  but not entirely so.  She has not used any nitroglycerin.  Electrocardiogram today shows sinus bradycardia at 51 beats per minute  with nonspecific ST-T wave changes and no marked change in comparison to  the previous tracing from January.  We reviewed her medications today.   ALLERGIES:  CODEINE and CELEBREX.   MEDICATIONS:  1. Nexium 40 mg p.o. daily.  2. Aspirin 325 mg p.o. daily.  3. Ferrous sulfate 325 mg p.o. daily.  4. Zocor 40 mg p.o. daily.  5. Metoprolol 25 mg p.o. b.i.d.  6. Entocort 3 mg p.o. daily.  7. Diclofenac 75 mg p.o. daily.  8. Omega-3 supplements 1000 mg p.o. daily.  9. Vitamin B and C supplements.  10.Hydrochlorothiazide 12.5 mg p.o.  daily.  11.Lisinopril 40 mg p.o. daily.  12.Levoxyl 0.15 mg p.o. daily.  13.Sublingual nitroglycerin 0.4 mg p.r.n.   REVIEW OF SYSTEMS:  As outlined above.  Otherwise, negative.   PHYSICAL EXAMINATION:  VITAL SIGNS:  Blood pressure today is 112/65,  heart rate is 53, respirations 20, weight is 134 pounds, and ox  saturation is 96% on room air.  GENERAL:  This is a thin woman in no acute distress without active chest  pain.  HEENT:  Conjunctiva is normal.  Oropharynx is clear.  NECK:  Supple.  No elevated jugular venous pressure.  No loud bruits.  LUNGS:  Clear without wheezing or rhonchi.  CARDIAC:  Regular rate and rhythm.  No S3 gallop or loud murmur.  ABDOMEN:  Soft, nontender.  EXTREMITIES:  No significant pitting edema.  Distal pulses are 2+.  SKIN:  Warm and dry.  MUSCULOSKELETAL:  No kyphosis noted.  NEUROPSYCHIATRIC:  The patient is alert and oriented x3.   IMPRESSION AND RECOMMENDATIONS:  1. Coronary artery disease, specifically subtotal occlusion of a small  nondominant right coronary artery which was treated with balloon      angioplasty, and and 80% small diagonal stenosis, which is being      managed medically.  Ejection fraction is normal, and by angiography      in January the patient had no other major obstructive stenoses in      the larger epicardial vessels.  She has had some recurrent      symptoms, which may well be pulmonary in etiology, although they do      remind her of her prior angina.  She has not had a followup      ischemic evaluation.  We will therefore plan a Lexiscan Myoview on      medical therapy.  If this is low-risk, we will plan to continue      medical therapy.  I would like to also add Imdur 30 mg p.o. nightly      to her regimen.  I will plan to see back her over the next 3      months, assuming she symptomatically improves.  2. Hyperlipidemia, on Zocor.     Jonelle Sidle, MD  Electronically Signed    SGM/MedQ  DD:  01/12/2009  DT: 01/13/2009  Job #: [id number]   cc:   Fara Chute

## 2011-01-25 NOTE — Assessment & Plan Note (Signed)
[facility]                          [facility]ICE NOTE   Andrea Jackson, Andrea Jackson                        MRN:          [id number]  DATE:10/21/2008                            DOB:          1934/08/06    PRIMARY CARE PHYSICIAN:  Fara Chute.   REASON FOR VISIT:  Post-hospital followup.   HISTORY OF PRESENT ILLNESS:  Andrea Jackson presents for a post-hospital  followup following recent coronary intervention.  She was admitted to  [facility] back in January with evidence of a non-ST-elevation  myocardial infarction.  She underwent cardiac catheterization at that  time which revealed a subtotally occluded small nondominant right  coronary artery as well as an 80% small diagonal stenosis, both of which  were managed medically initially.  She represented to the hospital with  recurrent chest pain and was taken back to the cardiac catheterization  lab by Dr. Riley Kill.  At that time, she underwent successful balloon  angioplasty and treatment of the subtotally occluded small nondominant  right coronary artery with good angiographic result.  Dual antiplatelet  therapy was planned for 2 weeks, and she was scheduled to come see me in  the clinic today.  She has not yet started cardiac rehabilitation but  plans to do so.  She has been doing some walking in her house.  She  still feels short of breath with activity and has had some wall  burning in her central chest at times.  She seems to related it to  taking Protonix and did not receive any relief with sublingual  nitroglycerin.  She reports that her symptoms were better controlled on  Nexium and one wonders if she has been experiencing reflux and  gastritis.  She only has one more Plavix pill to take before she  completes her 2-week course.  Today, I reviewed her angiogram report and  we talked about activity and the importance of medical therapy.   ALLERGIES:  CODEINE and CELEBREX.   PRESENT  MEDICATIONS:  1. Nexium 40 mg p.o. b.i.d.  2. Aspirin 325 mg p.o. daily.  3. Ferrous sulfate 325 mg p.o. daily.  4. Zocor 40 mg p.o. daily.  5. Lopressor 25 mg p.o. b.i.d.  6. Entocort 3 mg daily.  7. Synthroid 0.175 mg p.o. daily.  8. Diclofenac 75 mg p.o. daily.  9. Omega 3 supplement 1000 mg p.o. q.a.m.  10.Vitamin B12 and vitamin C supplements.  11.Plavix 75 mg p.o. daily (last dose tomorrow).  12.Sublingual nitroglycerin 0.4 mg p.r.n.   REVIEW OF SYSTEMS:  As outlined above.  She has had no groin site  complications.  No claudication.  No palpitations or syncope.  Otherwise  negative.   PHYSICAL EXAMINATION:  VITAL SIGNS:  Blood pressure is 119/63, heart  rate is 54, weight is 133 pounds.  GENERAL:  The patient is comfortable in no acute distress.  NECK:  Examination neck reveals no elevated jugular venous pressure.  No  bruits.  No thyromegaly.  LUNGS:  Clear without labored breathing at rest.  CARDIAC:  A regular rate and rhythm.  No  loud murmur, S3 gallop, or  pericardial rub.  ABDOMEN:  Soft, nontender.  Normoactive bowel sounds.  EXTREMITIES:  Exhibit no frank pitting edema.   IMPRESSION AND RECOMMENDATIONS:  Coronary artery disease status post  recent non-ST-elevation myocardial infarction with findings of an 80%  ostial small diagonal stenosis as well as a subtotally occluded small  nondominant right coronary artery, but no other major stenoses within  the larger epicardial vessels.  Given recurrent chest pain on medical  therapy and abnormal cardiac markers, Andrea Jackson was taken back to the  cardiac catheterization lab and underwent angioplasty of the small  nondominant right coronary artery by Dr. Riley Kill with good angiographic  result.  She will complete dual antiplatelet therapy soon and otherwise  we will anticipate she should enter cardiac rehabilitation.  She is  experiencing some probable reflux/gastritis symptoms which hopefully  will improve when she is  off Plavix.  She has gone back on Nexium just  recently which had better controlled her symptoms in the past.  I will  plan to see her back clinically over the next 6 weeks.  Of note, her  ejection fraction was 65% at angiography.     Jonelle Sidle, MD  Electronically Signed    SGM/MedQ  DD: 10/21/2008  DT: 10/22/2008  Job #: [id number]   cc:   Fara Chute

## 2011-01-28 NOTE — Op Note (Signed)
Andrea Jackson, Andrea Jackson                 ACCOUNT NO.:  [id number]   MEDICAL RECORD NO.:  [id number]          PATIENT TYPE:  AMB   LOCATION:  DAY                           FACILITY:  [facility]   PHYSICIAN:  R. Roetta Sessions, M.D. DATE OF BIRTH:  Dec 27, 1933   DATE OF PROCEDURE:  03/24/2006  DATE OF DISCHARGE:  03/24/2006                                 OPERATIVE REPORT   PROCEDURE PERFORMED:  Given's  small bowel capsule endoscopy.   REQUESTING PHYSICIAN:  R. Roetta Sessions, M.D.   INDICATIONS FOR PROCEDURE:  Longstanding iron deficiency anemia and  hemoccult positive stool.  Prior work-up included colonoscopy in August of  2005 by Sherilyn Cooter A. Cleotis Nipper, M.D. which was normal.  Esophagogastroduodenoscopy September 2005 by Dr. Jena Gauss revealed a subtle  Schatzki's ring, small hiatal hernia, prominent longitudinal antral fold.  Biopsy from the stomach revealed mild chronic gastropathy.  Mucosal biopsies  of the small bowel demonstrate a 25 to 30% loss of normal villous  architecture.  Celiac antibody panel came back entirely normal. Small bowel  capsule endoscopy was attempted two prior occasions.  The first time pill  never left the stomach and the second time there were technical difficulties  and the pill only recorded for 7 minutes and never left the esophagus.  The  patient presented recently with same complaints and underwent repeat  esophagogastroduodenoscopy and colonoscopy with Dr. Jena Gauss.  Antral biopsies  revealed inflamed granulation tissue, negative for Helicobacter pylori.  Biopsy from the duodenum revealed no villous atrophy.  Colon was normal.  Given's capsule was deployed through the pyloric channel.   FINDINGS:  The first duodenal image was at 41 minutes and 22 seconds after  deployment via esophagogastroduodenoscopy.  The study was 8 hours long and  the colon was never reached.  There were multiple abnormal findings  throughout the small bowel as outlined as following.  At 2 hours, 20  minutes  and 37 seconds there were petechial hemorrhages noted.  This was again noted  at 2 hours and 23 minutes.  At 2 hours, 23 minutes and 18 seconds, there was  increased erythema to the small bowel but no discrete ulceration at that  point.  Although at 2 hours, 26 minutes and 27 seconds first image of  ulceration noted.  There was no active bleeding.  The base appeared to be  clean.  Ulceration was also noted at 2 hours, 30 minutes and 58 seconds with  several areas including all the way to 2 hours, 33 minutes and 4 seconds.  There were at least two discrete ulcerations.  More distally, there was very  abnormal appearing small bowel mucosa at 5 hours, 21 minutes and 53 seconds.  There was marked erythema and induration although no discrete ulcer noted at  that point.  At 6 hours and 29 minutes there was marked erythema to the  small bowel mucosa with active bleeding noted as well.  From that point to  the end of the study there were several areas of abnormal appearing small  bowel mucosa with almost adenomatous type appearance with some  bleeding  noted.  The capsule never reached the colon at the end of the 8 hour study.   SUMMARY AND RECOMMENDATIONS:  Very abnormal appearing small bowel Given's  capsule study.  Multiple areas of ulceration noted both in what appeared to  be the midsmall bowel as well as distally.  Very abnormal appearing mucosa  in the distal small bowel with active bleeding that was noted above.  The  patient has a  history of ongoing NSAID use in the way of diclofenac.  The above findings  may be NSAID induced; however, we also need to consider inflammatory bowel  disease.  Further recommendations to be made by Dr. Jena Gauss.  Please note Dr.  Jena Gauss reviewed this study as well.  May consider [facility] inflammatory  bowel disease panel as next step.      Tana Coast, P.AJonathon Bellows, M.D.  Electronically Signed    LL/MEDQ  D:  04/03/2006  T:   04/03/2006  Job:  [id number]   cc:   Fara Chute  Fax: [phone]
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
## 2011-01-28 NOTE — Op Note (Signed)
NAMEJANEAL, ABADI                 ACCOUNT NO.:  [id number]   MEDICAL RECORD NO.:  [id number]          PATIENT TYPE:  AMB   LOCATION:  DAY                           FACILITY:  [facility]   PHYSICIAN:  R. Roetta Sessions, M.D. DATE OF BIRTH:  Sep 03, 1934   DATE OF PROCEDURE:  06/08/2004  DATE OF DISCHARGE:                                 OPERATIVE REPORT   PROCEDURE:  Esophagogastroduodenoscopy with Elease Hashimoto dilation followed by  gastric and small bowel biopsy.   INDICATIONS:  The patient is a 77 year old lady with longstanding microcytic  anemia.  Colonoscopy was just done by Dr. Cleotis Nipper in August 2005 without  significant findings.  She has been on Voltaren for quite awhile for  arthritis.  She has history of gastroesophageal reflux disease .  Her reflux  symptoms are worse in spite of  Prevacid 30 mg once daily.  She was found to  be hemoccult positive earlier this month through our office although she has  not had any hematochezia, melena or other GI symptoms along with some  intermittent solid food dysphagia.  EGD is now being done.  This approach  has been discussed with the patient at length.  The potential risks,  benefits and alternatives have been reviewed.  Please see my office  dictation.   DESCRIPTION OF PROCEDURE:  Oxygen saturation, blood pressure, pulse and  respiration were monitored throughout the entire procedure.  Conscious  sedation with IV Versed and Demerol in incremental doses.  Cetacaine spray  for topical oropharyngeal anesthesia.  The instrument was the Olympus video  chip system.   FINDINGS:  Esophagus:  Examination of the tubular esophagus revealed a  questionable, subtle Schatzki's ring at the EG junction.  Otherwise  esophageal mucosa appeared normal.  The EG junction was easily traversed  with the scope.   Stomach:  The gastric cavity was empty and insufflated well with air.  A  thorough examination of the gastric mucosa including the retroflexed view of  the proximal stomach and esophagogastric junction demonstrated a hiatal  hernia and some problems with longitudinal folds, particularly in the  antrum.  No overlying erosions, ulcer crater or obvious infiltrating  process.  Mucosa otherwise appeared normal.  See photos.  Pylorus was patent  and easily traversed.  Examination of the bulb and second portion and third  portions revealed no abnormalities.   THERAPY AND DIAGNOSTIC MANEUVERS:  1.  A 56-French Maloney dilator was passed to full insertion.  A look back      revealed no apparent complications related to this maneuver.  2.  The prominent linear folds and the antrum were biopsied.  3.  Biopsies of D2 and D3 were taken for histologic study to evaluate for      celiac disease.   The patient tolerated the procedure well and was reactive after endoscopy.   IMPRESSION:  1.  Probable subtle Schatzki's ring, status post dilation; otherwise normal      esophagus.  2.  Small hiatal hernia.  3.  Prominent longitudinal antral folds; otherwise normal stomach, status  post biopsy.  4.  Normal D1 through D3; biopsies D2 and D3 taken.   Tentative algorithm was to proceed with a Givens capsule study today if the  EGD were negative.  Since biopsies were taken, we will hold off on the  capsule study for the time being.   RECOMMENDATIONS:  1.  Increase Prevacid 30 mg orally b.i.d. for the next month.  2.  Antireflux measures emphasized.  3.  Follow up on biopsies.  Depending on biopsy findings, may well proceed      with Gibbons capsule study in the near future.  4.  Further recommendations to follow.     Otelia Sergeant   RMR/MEDQ  D:  06/08/2004  T:  06/08/2004  Job:  [id number]   cc:   Fara Chute  [address] [place]  [place] [address]  Fax: [phone]

## 2011-01-28 NOTE — Op Note (Signed)
Andrea Jackson, Andrea Jackson                 ACCOUNT NO.:  [id number]   MEDICAL RECORD NO.:  [id number]          PATIENT TYPE:  AMB   LOCATION:  DAY                           FACILITY:  [facility]   PHYSICIAN:  Andrea Jackson, M.D. DATE OF BIRTH:  [birth year]   DATE OF PROCEDURE:  03/24/2006  DATE OF DISCHARGE:                                 OPERATIVE REPORT   PROCEDURE:  Colonoscopy, diagnostic and esophagogastroduodenoscopy with  biopsies, Andrea Jackson small bowel capsule placement.   INDICATIONS:  The patient is a 77 year old lady with iron-deficiency anemia  and heme-positive stool.  Colonoscopy now being done and with possible EGD.  Andrea Jackson capsule placement.  This approach has been discussed with the  patient previously at length.  The potential risks, benefits and  alternatives have been reviewed. Questions answered and she is agreeable.  Please see documentation in medical record.   DESCRIPTION OF PROCEDURE:  Oxygen saturation, blood pressure, pulse and  respiration were monitored throughout the entire procedure.  Conscious  sedation with Versed 6 mg IV and Demerol 150 mg IV in divided doses.  The  instrument was the Olympus videochip system.   COLONOSCOPY:  Digital rectal exam revealed no abnormalities.   ENDOSCOPIC FINDINGS:  Prep was good.  Rectum:  Examination of the rectal mucosa including retroflexed view of the  anal verge revealed no abnormalities.  Colon:  Colonic mucosa was surveyed from the rectosigmoid junction to the  left, transverse,  right colon to the area of the appendiceal orifice,  ileocecal valve and cecum. These structures were well seen and photographed  for the record.  From this level the scope was slowly and cautiously  withdrawn.  All previously mentioned mucosal surfaces were again seen.  The  colonic mucosa appeared normal.  The patient tolerated procedure well and  was prepared for EGD.   EGD FINDINGS:  Esophagus:  Examination of the tubular esophagus  revealed no  mucosal abnormalities.  The EG junction was easily traversed.  Stomach:  The gastric cavity was emptied and insufflated well with air.  A  thorough examination of the gastric mucosa including retroflexion of the  proximal stomach and esophagogastric junction demonstrates a small hiatal  hernia and polypoid antral mucosa of uncertain significance.  No ulcer or  infiltrative process seen.  The stomach took a J-shape configuration.  Pylorus was patent and easily traversed.  Duodenum:  Examination of the bulb and second and third portions revealed no  abnormalities.   THERAPY AND DIAGNOSTIC MANEUVERS:  1.  Antral biopsies were taken for histologic study.  2.  Biopsy of the second portion of the duodenum were taken to reassess for      celiac disease.  3.  Scope was removed and the Andrea Jackson capsule was placed on the endoscope.      Via the Andrea Jackson deployment, the device was advanced into the stomach.      However, it was stiff and I was unable to angulate it into the J-shaped      antrum to the pyloric channel for deployment.  I elected to subsequently  deploy in the stomach, remove the scope, obtained a four-wire basket, a      two-wire basket and a Andrea Jackson.  I tried all of these modalities but      only the Andrea Jackson Jackson met with success deploying the capsule through the      pyloric channel.  The patient tolerated the somewhat prolonged series of      procedures very well and was reacted to endoscopy.   IMPRESSION:  1.  Normal rectum and colon.  2.  EGD findings:  Normal esophagus, J-shaped stomach, small hiatal hernia,      polypoid antral mucosa of uncertain significance, biopsied.  Patent      pylorus.  Normal D1 through D3, biopsies D2 taken.  Confirmed deployment      of Andrea Jackson capsule into the small bowel.   RECOMMENDATIONS:  1.  Will follow up on pathology (prior small bowel biopsies demonstrated 25-      30% loss of bilious architecture.  However, celiac antibody  panel was      negative).  2.  Review Andrea Jackson data when it becomes available and make further      recommendations in the very near future.   ADDENDUM:  From my office on March 09, 2006, white count 5.9, hemoglobin 9.4,  hematocrit 32, MCV 78, platelet count 309,000.      Andrea Jackson, M.D.  Electronically Signed     RMR/MEDQ  D:  03/24/2006  T:  03/24/2006  Job:  [id number]   cc:   Andrea Jackson  Fax: [phone]

## 2011-01-28 NOTE — Op Note (Signed)
[facility]  Patient:    Andrea Jackson, Andrea Jackson                     MRN: [id number] Proc. Date: 02/20/01 Adm. Date:  [id number] Attending:  Dominica Severin                           Operative Report  DATE OF BIRTH:  Mar 30, 1934  PREOPERATIVE DIAGNOSES: 1. End-stage right index finger proximal interphalangeal joint arthritis. 2. Right end-stage basilar thumb joint at the carpal-metacarpal region,    pantrapezial in nature.  POSTOPERATIVE DIAGNOSES: 1. End-stage right index finger proximal interphalangeal joint arthritis. 2. Right end-stage basilar thumb joint at the carpal-metacarpal region,    pantrapezial in nature.  PROCEDURES: 1. Right index finger PIP joint fusion with mini Acutrak screw. 2. Right basilar thumb joint trapezium excision (removal of carpal bone at the    wrist level). 3. Zancolli arthroplasty in the form of a tendon transfer utilizing the    abductor pollicis longus digastric portion with a modified Weilby and    Zancolli weave.  SURGEON:  Elisha Ponder, M.D.  ASSISTANT:  Colleen Mahar, P.A.-C.  ESTIMATED BLOOD LOSS:  Minimal.  DRAINS:  None.  ANESTHESIA:  General with supplemental axillary block.  COMPLICATIONS:   None immediate.  INDICATIONS FOR PROCEDURE:  This patient is a very pleasant 77 year old white female, who presents with the above-mentioned diagnosis.  I have counseled her in regards to risks and benefits of surgery including the risk of infection, bleeding, anesthesia, damage to normal structures, and failure of the surgery to accomplish its intended goals of relieving symptoms and restoring function. With this in mind, she desires to proceed.  All questions have been encouraged and answered preoperatively.  OPERATIVE FINDINGS:  The patient had end-stage arthritis at the above-mentioned joints and underwent fusion at the PIP joint and arthroplasty with the same procedure on the thumb without  difficulty.  OPERATION IN DETAIL:  The patient was by myself and anesthesia.  Axillary block was administered.  It did not provide complete relief from stimulant about the arm.  Thus, general anesthetic was employed.  Preoperative antibiotics were given in the form of Ancef, and the patient then underwent appropriate padding and was prepped and draped in the usual sterile fashion with Betadine scrub followed by Betadine painting and draping in the operative suite.  Once this was done, outline marks were made, arm was elevated, tourniquet was insufflated to 250 mmHg, and the patient had a curvilinear incision made along the dorsal aspect of the right index finger.  The patient had a turn-down extensor tendon flat cut created.  This was pulled distally. Collateral ligaments were released.  Following this, the patient underwent removal of osteophytes and hardened bone.  The patient had significant arthritis about the PIP joint.  This was end-stage.  There was hard, brittle bone with no cartilage left whatsoever.  This was denuded with rongeur to a cancellous base, cup and cone in nature.  Following this, the Acutrak guidepin was placed proximally and engaged distally in the medullary canal. Appropriate adjustments were made for AP and lateral plane deviation, etc., until the surgeons satisfaction was achieved.  I placed the finger in approximately 45 degrees of flexion.  I kept a slight bit of ulnar angulation but did correct significantly from its preoperative status.  Following this, the guidepin was placed, and the patient had reaming, followed by  depth gauge measurement, followed by placement of a mini Acutrak screw.  The length was approximately 20-22 mm.  Excellent purchase was achieved.  X-rays were taken which confirmed this.  I was happy with the x-ray confirmation and the overall position and flexion, slight ulnar deviation, and the coaptation of the two bony ends of cancellous  bone.  Following this, the extensor tendon was repaired, tourniquet was deflated, hemostasis obtained.  The patient had the wound sutured with Prolene.  Once this was done, the tourniquet was reelevated, and the patient had a curvilinear incision made over the [facility] joint.  The dissection was carried out between the EPB and APL.  The capsule was then incised.  Freer elevator placed on either side of the trapezium which was removed piecemeal with rongeur, curette, etc.  The patient had large, loose bodies noted.  These were removed.  The radial artery was protected at all times.  Following this, drill hole was made from the dorsal of the palmar, exiting in line with the inarticular big ligament.  This was performed without difficulty.  I was very happy with the placement inarticularly in line with the palmar big ligament.  Following this, one-half of the APL tendon was harvested proximally using atraumatic technique.  The superficial radial nerve and the radial artery were protected at all times, I should note.  Following this, the one-half slip of the APL was pulled distally, split in half, and then transferred.  The tendon transfer went through the dorsal to palmar drilL hole and the first metacarpal around the FCR (one-half of it) and then up through the APL proper.  Following this, the second slip was placed in the [facility] fashion between the APL and FCR.  This was looped multiple times. Tension was then set with longitudinal traction and radial abduction. Following this, the patient had the tendon transfer inset without difficulty. This was done to my satisfaction without difficulty with Mersilene suture. Following this, the remaining portion of the APL was anchovied into the defect.  The thumb was well-suspended.  I was very happy with the suspension, alignment, and overall look.  The patient had excellent refill once the tourniquet was deflated, no excessive bleeding, etc.  The wound was  copiously irrigated.  Following this, closure of the capsule was accomplished with Mersilene, followed by the closure of the skin edge with Prolene.  The patient  had thumb spica splint placed without difficulty and a mission sling in the recovery room.  The patient was transferred to the recovery room in stable condition.  All sponge, needle and instrument counts were reported as correct. A mixture of 0.5% Marcaine without epinephrine, approximately 25 cc were placed in the hand and CMC region for postop analgesia.  I have discussed all issues with the family.  All questions were encouraged and answered.  It has been a pleasure to participate in her care, and I look forward to participating in her recovery. DD:  02/20/01 TD:  02/20/01 Job: [id number]
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
## 2011-01-28 NOTE — H&P (Signed)
[facility]  Patient:    Andrea Jackson, Andrea Jackson                          MRN: [id number] Dictator:   Carlean Jews, P.A.-C                         History and Physical  DATE OF BIRTH:  September 26, 1933  I received a call from preadmission testing regarding [name] hemoglobin and hematocrit that were drawn today and the results were 8.3 and 26.3. Dr. Lucille Passy indicated that he was not comfortable with those levels for an elective procedure and requested that we speak with the patients medical doctor for clearance.  I called the patients medical doctor, who is Dr. Estanislado Pandy, and spoke with him in person and he indicated that the patients hemoglobin on January 05, 2001 was 10.0 and that the patient has had a full workup for anemia and it has been determined that she has iron deficiency anemia which is chronic.  He does not suspect that this would be a problem for surgery and said it would be okay with him to go ahead with this elective procedure, as long as the patient is orthostatically stable.  He did not recommend transfusing this patient prior to surgery.  I spoke with Dr. Rica Mast after speaking with Dr. Neita Carp and relayed this information to him and he said that that was okay and that he was all right and we can continue with this patients surgery as planned. DD:  02/15/01 TD:  02/16/01 Job: [id number]

## 2011-01-28 NOTE — Discharge Summary (Signed)
[facility]  Patient:    Andrea Jackson, Andrea Jackson                     MRN: [id number] Proc. Date: 02/20/01 Adm. Date:  [id number] Disc. Date: 02/22/01 Attending:  Dominica Severin Dictator:   Yevonne Pax                           Discharge Summary  SURGICAL PROCEDURE: 1. Right index finger proximal interphalangeal joint fusion    with Mini-Acutrak screw. 2. Right basilar thumb joint trapezium excision, removal of carpal    bone at the wrist level. 3. Zancolli arthroplasty in the form of a tendon transfer utilizing the    abductor pollicis longus digastric portion with a modified Weilby and    Zancolli weave by Dr. Amanda Pea with the assitance of Sherryll Burger, P.A.-C. on    February 20, 2001. Please see operative summary for further details.  CONSULTATIONS:  None.  PRIMARY DIAGNOSES: 1. End stage right index finger proximal interphalangeal joint    arthritis. 2. Right end stage basilar thumb joint at the carpometacarpal region,    pantrapezial in nature.  SECONDARY DIAGNOSES: 1. Hypothyroidism. 2. Hypertension. 3. Anemia.  LABORATORY STUDIES:  Preoperative labs: Urinalysis was all within normal limits. Her BUN was slightly elevated at 24. Albumin was 2.9, total protein was low at 5.3. PT and PTT were within normal limits. CBC showed some anemia with a hemoglobin of 8.3 and a hematocrit of 26.3. Remainder of her differential was within normal limits. No further labs were ordered during her hospital stay.  CHIEF COMPLAINT:  Right index finger and thumb pain.  HISTORY OF PRESENT ILLNESS:  Andrea Jackson is a 77 year old right hand dominant female who has had increasing right index and thumb pain. The pain has been present and affected her daily activities. Because of increasing discomfort, she has seen Dr. Amanda Pea on previous occasions for conservative treatments including steroid injections, anti-inflammatory medications, and glucosamine and chondroitin  medications. She saw really no improvement with these conservative measures, and because of the significant disability, elected to undergo a surgical intervention for the right hand. After discussion of the risk, benefits, and alternatives of our surgical procedure, she elected to undergo surgery. She was admitted on June 11 for surgical intervention.  HOSPITAL COURSE:  Following the surgical procedure the patient was taken to recovery room in stable condition and transferred to the orthopedic floor in good condition. She did well during her hospital stay. The patient does have a history of nausea that she relates to an old back surgery. Nausea is present on most days. She did have some problems with nausea postoperatively, especially when taking her medications on an empty stomach. The nausea, though, was well controlled with antiemetic agents. She had no vomiting. She initially was on PCA Dilaudid for pain management, and then subsequently, p.o. Percocet which controlled her pain well. She received three doses of Ancef 1 g IV postoperatively. Her splint was examined each day and was clean, dry and intact. Her capillary refill was less than two seconds at all times, and her motor and sensory were grossly intact in those fingers. Occupational therapy was consulted for ADLs and ambulation. The patient tolerated ADLs well and was able to ambulate up to the restroom without difficulty. The patient was ______  postoperative day #1 to postoperative day #2 for pain control measures. On postoperative day #2 her pain was better controlled  and she was stable and ready for discharge home.  DISCHARGE DISPOSITION:  She will discharged home.  FOLLOWUP/PLAN:  The patient is to follow up with Dr. Amanda Pea in 10 to 14 days. She is to call [phone] for an appointment. She is to have a low-sodium diet. Her activity: She should do no lifting, pushing, or pulling with that right arm. No driving. Utilize the  sling on the right hand. Keep the splint clean, dry and intact. Instructed on ice and elevation.  DISCHARGE MEDICATIONS: 1. Synthroid 0.175 mg p.o. q.d. 2. Allegra p.r.n. 3. Zestril 10 mg p.o. q.d. 4. Iron supplement 325 mg, two p.o. q.d. 5. Vitamin supplements. 6. Robaxin 500 mg p.o. q.8h. p.r.n. spasm. 7. Percocet 5/325 one p.o. q.4-6h. p.r.n. pain. 8. Phenergan 25 mg p.o. or p.r. q.4-6h. p.r.n. nausea and vomiting. 9. She is not to take the Voltaren presently.  CONDITION ON DISCHARGE:  Good and improved. DD:  02/22/01 TD:  02/22/01 Job: [id number]
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
## 2011-03-30 ENCOUNTER — Encounter: Payer: Self-pay | Admitting: Urgent Care

## 2011-03-30 ENCOUNTER — Ambulatory Visit (INDEPENDENT_AMBULATORY_CARE_PROVIDER_SITE_OTHER): Payer: 59 | Admitting: Urgent Care

## 2011-03-30 DIAGNOSIS — D509 Iron deficiency anemia, unspecified: Secondary | ICD-10-CM

## 2011-03-30 DIAGNOSIS — K5900 Constipation, unspecified: Secondary | ICD-10-CM | POA: Insufficient documentation

## 2011-03-30 MED ORDER — POLYETHYLENE GLYCOL 3350 17 GM/SCOOP PO POWD: 17.0000 g | Freq: Every day | ORAL | Status: AC

## 2011-03-30 MED ORDER — BISACODYL-PEG-KCL-NABICAR-NACL 5-210 MG-GM PO KIT: PACK | ORAL | Status: DC

## 2011-03-30 NOTE — Progress Notes (Signed)
Primary Care Physician:  Estanislado Pandy, MD Primary Gastroenterologist:  Dr. Jena Gauss  Chief Complaint  Patient presents with  . Follow-up  . Colonoscopy    HPI:  Andrea Jackson is a 77 y.o. female here to set up colonoscopy.  C/o "No good BM" in 2 weeks.  Had been taking zofran 4mg  daily.  C/o significant abd bloating.  Taking 2 dulcolax at a time.  C/o constipation.  Lots of straining w/ hard stools.  C/o lots of belching.  Intermittent heartburn when constipated but otherwise nexium 40mg  is doing well.  Denies N/V.  Denies dysphagia or odynophagia.    Past Medical History  Diagnosis Date  . Hypertension   . Hyperlipidemia   . Coronary artery disease      NSTEMI 1/10, PTCA nondominant RCA 1/10, LVEF normal  . Arthritis   . Hypothyroidism   . Small bowel ulcers     GIVENS capsule study 03/24/2006, multiple areas of ulceration, mid-distal SB , Prometheus panel suggested Crohn's  . Iron deficiency anemia     sec chronic SB GI bleeding ulcers & chronic NSAIDs  . S/P colonoscopy 03/24/06    benign-Dr Rourk  . S/P endoscopy 03/24/06    benign-Dr. Jena Gauss   Past Surgical History  Procedure Date  . Appendectomy   . Cholecystectomy   . Partial hysterectomy   . Tonsillectomy   . Nose surgery   . Hand surgery   . Cataract extraction    Current Outpatient Prescriptions  Medication Sig Dispense Refill  . aspirin 325 MG tablet Take 162 mg by mouth daily.        . Calcium Carbonate-Vitamin D (CALTRATE 600+D) 600-400 MG-UNIT per tablet Take 1 tablet by mouth daily.        . clonazePAM (KLONOPIN) 0.5 MG tablet Take 0.25-0.5 mg by mouth every 6 (six) hours.       . diclofenac (VOLTAREN) 75 MG EC tablet Take 75 mg by mouth 2 (two) times daily.        Marland Kitchen esomeprazole (NEXIUM) 40 MG capsule Take 40 mg by mouth daily before breakfast.        . ferrous sulfate 325 (65 FE) MG tablet Take 325 mg by mouth 2 (two) times daily.        . Flaxseed, Linseed, (FLAX SEED OIL) 1000 MG CAPS Take by mouth daily.         . isosorbide mononitrate (IMDUR) 30 MG 24 hr tablet Take 15 mg by mouth daily.        Marland Kitchen levothyroxine (SYNTHROID, LEVOTHROID) 100 MCG tablet Take 100 mcg by mouth daily.        Marland Kitchen lisinopril (PRINIVIL,ZESTRIL) 40 MG tablet Take 40 mg by mouth daily.       . metoprolol tartrate (LOPRESSOR) 25 MG tablet Take 12.5 mg by mouth 2 (two) times daily.        . nitroGLYCERIN (NITROSTAT) 0.4 MG SL tablet Place 0.4 mg under the tongue every 5 (five) minutes as needed.        . Omega-3 Fatty Acids (FISH OIL) 1200 MG CAPS Take by mouth daily.        . pravastatin (PRAVACHOL) 10 MG tablet Take 10 mg by mouth daily.        . promethazine (PHENERGAN) 25 MG tablet Take 12.5-25 mg by mouth every 6 (six) hours as needed.       . tapentadol (NUCYNTA) 50 MG TABS Take 50 mg by mouth 3 (three) times daily.        Marland Kitchen  traMADol (ULTRAM) 50 MG tablet Take 50 mg by mouth every 6 (six) hours as needed.        . Bisacodyl-PEG-KCl-NaBicar-NaCl (HALFLYTELY BOWEL PREP) 5-210 MG-GM kit Use as directed  1 each  0  . polyethylene glycol powder (MIRALAX) powder Take 17 g by mouth daily.  527 g  5   Allergies as of 03/30/2011 - Review Complete 03/30/2011  Allergen Reaction Noted  . Celecoxib    . Codeine    . Oxycodone-acetaminophen  12/15/2010   Family History  Problem Relation Age of Onset  . Cancer Father   . Diabetes Mother   . Cancer Son     colon   History   Social History  . Marital Status: Widowed    Spouse Name: N/A    Number of Children: 4  . Years of Education: N/A   Occupational History  . retired    Social History Main Topics  . Smoking status: Never Smoker   . Smokeless tobacco: Never Used  . Alcohol Use: No  . Drug Use: No  . Sexually Active: No   Other Topics Concern  . Not on file   Social History Narrative   Lives w/ youngest son    Review of Systems: Gen: Denies any fever, chills, sweats, anorexia, fatigue, weakness, malaise, weight loss, and sleep disorder CV: Denies chest pain,  angina, palpitations, syncope, orthopnea, PND, peripheral edema, and claudication. Resp: Denies dyspnea at rest, dyspnea with exercise, cough, sputum, wheezing, coughing up blood, and pleurisy. GI: Denies vomiting blood, jaundice, and fecal incontinence.   Denies dysphagia or odynophagia. GU : Denies urinary burning, blood in urine, urinary frequency, urinary hesitancy, nocturnal urination, and urinary incontinence. MS: Denies joint pain, limitation of movement, and swelling, stiffness, low back pain, extremity pain. Denies muscle weakness, cramps, atrophy.  Derm: Denies rash, itching, dry skin, hives, moles, warts, or unhealing ulcers.  Psych: Denies depression, anxiety, memory loss, suicidal ideation, hallucinations, paranoia, and confusion. Heme: Denies bruising, bleeding, and enlarged lymph nodes.  Physical Exam: BP 127/78  Pulse 55  Temp(Src) 97.4 F (36.3 C) (Temporal)  Ht 5\' 5"  (1.651 m)  Wt 131 lb 9.6 oz (59.693 kg)  BMI 21.90 kg/m2 General:   Alert,  Well-developed, well-nourished, pleasant and cooperative in NAD Head:  Normocephalic and atraumatic. Eyes:  Sclera clear, no icterus.   Conjunctiva pink. Ears:  Normal auditory acuity. Nose:  No deformity, discharge,  or lesions. Mouth:  No deformity or lesions, dentition normal. Neck:  Supple; no masses or thyromegaly. Lungs:  Clear throughout to auscultation.   No wheezes, crackles, or rhonchi. No acute distress. Heart:  Regular rate and rhythm; no murmurs, clicks, rubs,  or gallops. Abdomen:  Soft, nontender and nondistended. No masses, hepatosplenomegaly or hernias noted. Normal bowel sounds, without guarding, and without rebound.   Rectal:  Deferred until time of colonoscopy.   Msk:  Symmetrical without gross deformities. Normal posture. Pulses:  Normal pulses noted. Extremities:  Without clubbing or edema.  Left hip w/ freely mobile subcutaneous irregular nodule. Neurologic:  Alert and  oriented x4;  grossly normal  neurologically. Skin:  Intact without significant lesions or rashes. Cervical Nodes:  No significant cervical adenopathy. Psych:  Alert and cooperative. Normal mood and affect.

## 2011-03-30 NOTE — Assessment & Plan Note (Signed)
Chronic in setting of NSAID-induced SB ulcers.  Pt tells me her last CBC was normal.  She may be able to decrease or discontinue her daily iron supplement.  Will request most recent CBC from [facility], [place].

## 2011-03-30 NOTE — Assessment & Plan Note (Addendum)
Chronic with bloating.  Add miralax.  Due for colonoscopy at this time.  I have discussed risks & benefits which include, but are not limited to, bleeding, infection, perforation & drug reaction.  The patient agrees with this plan & written consent will be obtained.   Procedure will need to be done with deep sedation (propofol) in the OR under the direction of anesthesia services for multiple psychoactive medications.

## 2011-03-30 NOTE — Progress Notes (Signed)
Cc to PCP 

## 2011-03-30 NOTE — Telephone Encounter (Signed)
Opened in error

## 2011-03-30 NOTE — Patient Instructions (Signed)
Follow up w/ Dr Neita Carp about your nodule on your hip.

## 2011-04-04 ENCOUNTER — Encounter (HOSPITAL_COMMUNITY)
Admission: RE | Admit: 2011-04-04 | Discharge: 2011-04-04 | Disposition: A | Discharge status: Home / Self Care | Payer: PRIVATE HEALTH INSURANCE | Source: Ambulatory Visit | Attending: Internal Medicine | Admitting: Internal Medicine

## 2011-04-04 ENCOUNTER — Encounter (HOSPITAL_COMMUNITY): Payer: Self-pay

## 2011-04-04 ENCOUNTER — Telehealth: Payer: Self-pay

## 2011-04-04 DIAGNOSIS — E875 Hyperkalemia: Secondary | ICD-10-CM

## 2011-04-04 HISTORY — DX: Gastro-esophageal reflux disease without esophagitis: K21.9

## 2011-04-04 HISTORY — DX: Acute myocardial infarction, unspecified: I21.9

## 2011-04-04 HISTORY — DX: Anxiety disorder, unspecified: F41.9

## 2011-04-04 LAB — BASIC METABOLIC PANEL
CO2: 30 mEq/L (ref 19–32)
Chloride: 102 mEq/L (ref 96–112)
Creatinine, Ser: 1.09 mg/dL (ref 0.50–1.10)
Potassium: 6.1 mEq/L — ABNORMAL HIGH (ref 3.5–5.1)

## 2011-04-04 LAB — CBC
MCV: 88.2 fL (ref 78.0–100.0)
Platelets: 268 10*3/uL (ref 150–400)
RBC: 4.23 MIL/uL (ref 3.87–5.11)
WBC: 6.1 10*3/uL (ref 4.0–10.5)

## 2011-04-04 NOTE — Patient Instructions (Addendum)
20 Andrea Jackson  04/04/2011   Your procedure is scheduled on:  04/07/2011  Report to [facility] at  900  AM.  Call this number if you have problems the morning of surgery: [phone]   Remember:   Do not eat food:After Midnight.  Do not drink clear liquids: After Midnight.  Take these medicines the morning of surgery with A SIP OF WATER: klonazepam,nexium,synthroid,lisinopril,metoprolol   Do not wear jewelry, make-up or nail polish.  Do not bring valuables to the hospital.  Contacts, dentures or bridgework may not be worn into surgery.  Leave suitcase in the car. After surgery it may be brought to your room.  For patients admitted to the hospital, checkout time is 11:00 AM the day of discharge.   Patients discharged the day of surgery will not be allowed to drive home.  Name and phone number of your driver: family  Special Instructions: N/A   Please read over the following fact sheets that you were given: Pain Booklet and Anesthesia Post-op Instructions PATIENT INSTRUCTIONS POST-ANESTHESIA  IMMEDIATELY FOLLOWING SURGERY:  Do not drive or operate machinery for the first twenty four hours after surgery.  Do not make any important decisions for twenty four hours after surgery or while taking narcotic pain medications or sedatives.  If you develop intractable nausea and vomiting or a severe headache please notify your doctor immediately.  FOLLOW-UP:  Please make an appointment with your surgeon as instructed. You do not need to follow up with anesthesia unless specifically instructed to do so.  WOUND CARE INSTRUCTIONS (if applicable):  Keep a dry clean dressing on the anesthesia/puncture wound site if there is drainage.  Once the wound has quit draining you may leave it open to air.  Generally you should leave the bandage intact for twenty four hours unless there is drainage.  If the epidural site drains for more than 36-48 hours please call the anesthesia department.  QUESTIONS?:  Please  feel free to call your physician or the hospital operator if you have any questions, and they will be happy to assist you.     [facility] Anesthesia Department [address] [place] [phone]

## 2011-04-04 NOTE — Telephone Encounter (Signed)
Andrea Jackson from Day surgery said pt came in for pre-op today and her potassium is 6.1. Please advise! Paging Dr. Jena Gauss also.

## 2011-04-04 NOTE — Pre-Procedure Instructions (Signed)
K+ of 6.1 called to [facility] at Dr Rourk's office.

## 2011-04-05 ENCOUNTER — Other Ambulatory Visit: Payer: Self-pay | Admitting: Internal Medicine

## 2011-04-05 LAB — BASIC METABOLIC PANEL
CO2: 30 mEq/L (ref 19–32)
Calcium: 9.6 mg/dL (ref 8.4–10.5)
Sodium: 138 mEq/L (ref 135–145)

## 2011-04-05 NOTE — Telephone Encounter (Signed)
LATE ENTRY. Dr. Jena Gauss answered my page yesterday evening, 04/04/2011 and told me to have pt repeat the Potassium early today ( Tuesday, 04/05/2011). He ordered a BMET. Pt was informed and said that she would come to [place] and go to [place] this AM. Order was faxed yesterday about 4:45 PM to [employer].

## 2011-04-05 NOTE — Pre-Procedure Instructions (Signed)
K+ of 6.1 called to Dr Luvenia Starch office 04/04/11.Spoke with Tyler Aas. Value reported to Dr. Jayme Cloud this am. Also spoke with Dr Jena Gauss, pt was to have potassium repeated this am by office.They will call when results are back.

## 2011-04-07 ENCOUNTER — Encounter (HOSPITAL_COMMUNITY): Payer: Self-pay | Admitting: *Deleted

## 2011-04-07 ENCOUNTER — Encounter (HOSPITAL_COMMUNITY)
Admission: RE | Disposition: A | Discharge status: Home / Self Care | Payer: Self-pay | Source: Ambulatory Visit | Attending: Internal Medicine

## 2011-04-07 ENCOUNTER — Encounter: Payer: 59 | Admitting: Internal Medicine

## 2011-04-07 ENCOUNTER — Ambulatory Visit (HOSPITAL_COMMUNITY)
Admission: RE | Admit: 2011-04-07 | Discharge: 2011-04-07 | Disposition: A | Discharge status: Home / Self Care | Payer: PRIVATE HEALTH INSURANCE | Source: Ambulatory Visit | Attending: Internal Medicine | Admitting: Internal Medicine

## 2011-04-07 ENCOUNTER — Ambulatory Visit (HOSPITAL_COMMUNITY): Payer: PRIVATE HEALTH INSURANCE | Admitting: Anesthesiology

## 2011-04-07 ENCOUNTER — Encounter (HOSPITAL_COMMUNITY): Payer: Self-pay | Admitting: Anesthesiology

## 2011-04-07 DIAGNOSIS — Z01812 Encounter for preprocedural laboratory examination: Secondary | ICD-10-CM | POA: Insufficient documentation

## 2011-04-07 DIAGNOSIS — K5900 Constipation, unspecified: Secondary | ICD-10-CM

## 2011-04-07 DIAGNOSIS — K648 Other hemorrhoids: Secondary | ICD-10-CM

## 2011-04-07 DIAGNOSIS — Z79899 Other long term (current) drug therapy: Secondary | ICD-10-CM | POA: Insufficient documentation

## 2011-04-07 DIAGNOSIS — K644 Residual hemorrhoidal skin tags: Secondary | ICD-10-CM | POA: Insufficient documentation

## 2011-04-07 DIAGNOSIS — E785 Hyperlipidemia, unspecified: Secondary | ICD-10-CM | POA: Insufficient documentation

## 2011-04-07 DIAGNOSIS — I10 Essential (primary) hypertension: Secondary | ICD-10-CM | POA: Insufficient documentation

## 2011-04-07 DIAGNOSIS — Z800 Family history of malignant neoplasm of digestive organs: Secondary | ICD-10-CM

## 2011-04-07 DIAGNOSIS — K5909 Other constipation: Secondary | ICD-10-CM | POA: Insufficient documentation

## 2011-04-07 DIAGNOSIS — K5730 Diverticulosis of large intestine without perforation or abscess without bleeding: Secondary | ICD-10-CM

## 2011-04-07 HISTORY — PX: COLONOSCOPY: SHX5424

## 2011-04-07 SURGERY — COLONOSCOPY
Anesthesia: Monitor Anesthesia Care

## 2011-04-07 MED ORDER — ONDANSETRON HCL 4 MG/2ML IJ SOLN: 4.0000 mg | Freq: Once | INTRAMUSCULAR | Status: DC | PRN

## 2011-04-07 MED ORDER — LACTATED RINGERS IV SOLN
INTRAVENOUS | Status: DC
  Administered 2011-04-07: 1000 mL via INTRAVENOUS

## 2011-04-07 MED ORDER — MIDAZOLAM HCL 2 MG/2ML IJ SOLN
INTRAMUSCULAR | Status: AC
  Filled 2011-04-07: qty 2

## 2011-04-07 MED ORDER — PROPOFOL 10 MG/ML IV EMUL
INTRAVENOUS | Status: DC | PRN
  Administered 2011-04-07 (×2): 12 mg via INTRAVENOUS
  Administered 2011-04-07 (×2): 6 mg via INTRAVENOUS

## 2011-04-07 MED ORDER — MIDAZOLAM HCL 5 MG/5ML IJ SOLN
INTRAMUSCULAR | Status: DC | PRN
  Administered 2011-04-07 (×2): 1 mg via INTRAVENOUS

## 2011-04-07 MED ORDER — ONDANSETRON HCL 4 MG/2ML IJ SOLN
INTRAMUSCULAR | Status: AC
  Administered 2011-04-07: 4 mg via INTRAVENOUS
  Filled 2011-04-07: qty 2

## 2011-04-07 MED ORDER — PROPOFOL 10 MG/ML IV EMUL
INTRAVENOUS | Status: DC | PRN
  Administered 2011-04-07: 75 ug/kg/min via INTRAVENOUS

## 2011-04-07 MED ORDER — FENTANYL CITRATE 0.05 MG/ML IJ SOLN
INTRAMUSCULAR | Status: AC
  Filled 2011-04-07: qty 2

## 2011-04-07 MED ORDER — GLYCOPYRROLATE 0.2 MG/ML IJ SOLN
INTRAMUSCULAR | Status: AC
  Administered 2011-04-07: 0.2 mg via INTRAVENOUS
  Filled 2011-04-07: qty 1

## 2011-04-07 MED ORDER — MIDAZOLAM HCL 2 MG/2ML IJ SOLN
1.0000 mg | INTRAMUSCULAR | Status: DC | PRN
  Administered 2011-04-07: 2 mg via INTRAVENOUS

## 2011-04-07 MED ORDER — FENTANYL CITRATE 0.05 MG/ML IJ SOLN
INTRAMUSCULAR | Status: DC | PRN
  Administered 2011-04-07: 25 ug via INTRAVENOUS
  Administered 2011-04-07: 50 ug via INTRAVENOUS
  Administered 2011-04-07: 25 ug via INTRAVENOUS

## 2011-04-07 MED ORDER — MIDAZOLAM HCL 2 MG/2ML IJ SOLN
INTRAMUSCULAR | Status: AC
  Administered 2011-04-07: 2 mg via INTRAVENOUS
  Filled 2011-04-07: qty 2

## 2011-04-07 MED ORDER — STERILE WATER FOR IRRIGATION IR SOLN
Status: DC | PRN
  Administered 2011-04-07: 13:00:00

## 2011-04-07 MED ORDER — GLYCOPYRROLATE 0.2 MG/ML IJ SOLN
0.2000 mg | Freq: Once | INTRAMUSCULAR | Status: AC
  Administered 2011-04-07: 0.2 mg via INTRAVENOUS

## 2011-04-07 MED ORDER — FENTANYL CITRATE 0.05 MG/ML IJ SOLN: 25.0000 ug | INTRAMUSCULAR | Status: DC | PRN

## 2011-04-07 MED ORDER — PROPOFOL 10 MG/ML IV EMUL
INTRAVENOUS | Status: AC
  Filled 2011-04-07: qty 20

## 2011-04-07 MED ORDER — ONDANSETRON HCL 4 MG/2ML IJ SOLN
4.0000 mg | Freq: Once | INTRAMUSCULAR | Status: AC
  Administered 2011-04-07: 4 mg via INTRAVENOUS

## 2011-04-07 SURGICAL SUPPLY — 23 items
ELECT REM PT RETURN 9FT ADLT (ELECTROSURGICAL)
ELECTRODE REM PT RTRN 9FT ADLT (ELECTROSURGICAL) IMPLANT
FCP BXJMBJMB 240X2.8X (CUTTING FORCEPS)
FLOOR PAD 36X40 (MISCELLANEOUS) ×2
FORCEPS BIOP RAD 4 LRG CAP 4 (CUTTING FORCEPS) IMPLANT
FORCEPS BIOP RJ4 240 W/NDL (CUTTING FORCEPS)
FORCEPS BXJMBJMB 240X2.8X (CUTTING FORCEPS) IMPLANT
INJECTOR/SNARE I SNARE (MISCELLANEOUS) IMPLANT
LUBRICANT JELLY 4.5OZ STERILE (MISCELLANEOUS) ×1 IMPLANT
MANIFOLD NEPTUNE II (INSTRUMENTS) ×2 IMPLANT
NDL SCLEROTHERAPY 25GX240 (NEEDLE) IMPLANT
NEEDLE SCLEROTHERAPY 25GX240 (NEEDLE) IMPLANT
PAD FLOOR 36X40 (MISCELLANEOUS) ×1 IMPLANT
PROBE APC STR FIRE (PROBE) IMPLANT
PROBE INJECTION GOLD (MISCELLANEOUS)
PROBE INJECTION GOLD 7FR (MISCELLANEOUS) IMPLANT
SNARE ROTATE MED OVAL 20MM (MISCELLANEOUS) IMPLANT
SYR 50ML LL SCALE MARK (SYRINGE) ×1 IMPLANT
TRAP SPECIMEN MUCOUS 40CC (MISCELLANEOUS) IMPLANT
TUBING ENDO SMARTCAP (MISCELLANEOUS) ×2 IMPLANT
TUBING ENDO SMARTCAP PENTAX (MISCELLANEOUS) IMPLANT
TUBING IRRIGATION ENDOGATOR (MISCELLANEOUS) ×2 IMPLANT
WATER STERILE IRR 1000ML POUR (IV SOLUTION) ×1 IMPLANT

## 2011-04-07 NOTE — Transfer of Care (Signed)
Immediate Anesthesia Transfer of Care Note  Patient: Andrea Jackson  Procedure(s) Performed:  COLONOSCOPY - In cecum@1244     withdrawal time- 7 minutes  Patient Location: PACU  Anesthesia Type: MAC  Level of Consciousness: awake  Airway & Oxygen Therapy: Patient Spontanous Breathing  Post-op Assessment: Report given to PACU RN, Post -op Vital signs reviewed and stable and Patient moving all extremities  Post vital signs: Reviewed and stable  Complications: No apparent anesthesia complications

## 2011-04-07 NOTE — Anesthesia Preprocedure Evaluation (Addendum)
Anesthesia Evaluation  Name, MR# and DOB Patient awake  General Assessment Comment  Reviewed: Allergy & Precautions, H&P  and Patient's Chart, lab work & pertinent test results  History of Anesthesia Complications Negative for: history of anesthetic complications  Airway Mallampati: I  Neck ROM: Full    Dental  (+) Edentulous Upper and Partial Lower   Pulmonary  clear to auscultation    Cardiovascular hypertension, Pt. on medications + CAD and + Past MI Regular    Neuro/Psych (+) {AN ROS/MED HX NEURO HEADACHES (+) Anxiety,   GI/Hepatic/Renal (+) PUD,  GERD      Endo/Other   (+)  Hypothyroidism,  Abdominal   Musculoskeletal  Hematology   Peds  Reproductive/Obstetrics   Anesthesia Other Findings             Anesthesia Physical Anesthesia Plan  ASA: III  Anesthesia Plan: MAC   Post-op Pain Management:    Induction:   Airway Management Planned: Simple Face Mask  Additional Equipment:   Intra-op Plan:   Post-operative Plan:   Informed Consent: I have reviewed the patients History and Physical, chart, labs and discussed the procedure including the risks, benefits and alternatives for the proposed anesthesia with the patient or authorized representative who has indicated his/her understanding and acceptance.     Plan Discussed with:   Anesthesia Plan Comments:         Anesthesia Quick Evaluation

## 2011-04-07 NOTE — Anesthesia Postprocedure Evaluation (Signed)
  Anesthesia Post-op Note  Patient: Andrea Jackson  Procedure(s) Performed:  COLONOSCOPY - In cecum@1244     withdrawal time- 7 minutes  Patient Location: PACU  Anesthesia Type: MAC  Level of Consciousness: awake, alert  and oriented  Airway and Oxygen Therapy: Patient Spontanous Breathing  Post-op Pain: none  Post-op Assessment: Post-op Vital signs reviewed, Patient's Cardiovascular Status Stable, Respiratory Function Stable, Patent Airway, No signs of Nausea or vomiting and Pain level controlled  Post-op Vital Signs: Reviewed and stable  Complications: No apparent anesthesia complications

## 2011-04-07 NOTE — Anesthesia Procedure Notes (Signed)
Procedure Name: MAC Date/Time: 04/07/2011 12:25 PM Performed by: Minerva Areola Pre-anesthesia Checklist: Patient identified, Patient being monitored, Emergency Drugs available, Suction available and Timeout performed Patient Re-evaluated:Patient Re-evaluated prior to inductionOxygen Delivery Method: Simple face mask

## 2011-04-07 NOTE — H&P (Signed)
Lorenza Burton, NP  03/30/2011  1:23 PM  Signed Primary Care Physician:  Estanislado Pandy, MD Primary Gastroenterologist:  Dr. Jena Gauss    Chief Complaint   Patient presents with   .  Follow-up   .  Colonoscopy      HPI:  Andrea Jackson is a 77 y.o. female here to set up colonoscopy.  C/o "No good BM" in 2 weeks.  Had been taking zofran 4mg  daily.  C/o significant abd bloating.  Taking 2 dulcolax at a time.  C/o constipation.  Lots of straining w/ hard stools.  C/o lots of belching.  Intermittent heartburn when constipated but otherwise nexium 40mg  is doing well.  Denies N/V.  Denies dysphagia or odynophagia.      Past Medical History   Diagnosis  Date   .  Hypertension     .  Hyperlipidemia     .  Coronary artery disease          NSTEMI 1/10, PTCA nondominant RCA 1/10, LVEF normal   .  Arthritis     .  Hypothyroidism     .  Small bowel ulcers         GIVENS capsule study 03/24/2006, multiple areas of ulceration, mid-distal SB , Prometheus panel suggested Crohn's   .  Iron deficiency anemia         sec chronic SB GI bleeding ulcers & chronic NSAIDs   .  S/P colonoscopy  03/24/06       benign-Dr Grayling Schranz   .  S/P endoscopy  03/24/06       benign-Dr. Jena Gauss    Past Surgical History   Procedure  Date   .  Appendectomy     .  Cholecystectomy     .  Partial hysterectomy     .  Tonsillectomy     .  Nose surgery     .  Hand surgery     .  Cataract extraction      Current Outpatient Prescriptions   Medication  Sig  Dispense  Refill   .  aspirin 325 MG tablet  Take 162 mg by mouth daily.           .  Calcium Carbonate-Vitamin D (CALTRATE 600+D) 600-400 MG-UNIT per tablet  Take 1 tablet by mouth daily.           .  clonazePAM (KLONOPIN) 0.5 MG tablet  Take 0.25-0.5 mg by mouth every 6 (six) hours.          .  diclofenac (VOLTAREN) 75 MG EC tablet  Take 75 mg by mouth 2 (two) times daily.           Marland Kitchen  esomeprazole (NEXIUM) 40 MG capsule  Take 40 mg by mouth daily before breakfast.           .   ferrous sulfate 325 (65 FE) MG tablet  Take 325 mg by mouth 2 (two) times daily.           .  Flaxseed, Linseed, (FLAX SEED OIL) 1000 MG CAPS  Take by mouth daily.           .  isosorbide mononitrate (IMDUR) 30 MG 24 hr tablet  Take 15 mg by mouth daily.           Marland Kitchen  levothyroxine (SYNTHROID, LEVOTHROID) 100 MCG tablet  Take 100 mcg by mouth daily.           Marland Kitchen  lisinopril (PRINIVIL,ZESTRIL) 40 MG  tablet  Take 40 mg by mouth daily.          .  metoprolol tartrate (LOPRESSOR) 25 MG tablet  Take 12.5 mg by mouth 2 (two) times daily.           .  nitroGLYCERIN (NITROSTAT) 0.4 MG SL tablet  Place 0.4 mg under the tongue every 5 (five) minutes as needed.           .  Omega-3 Fatty Acids (FISH OIL) 1200 MG CAPS  Take by mouth daily.           .  pravastatin (PRAVACHOL) 10 MG tablet  Take 10 mg by mouth daily.           .  promethazine (PHENERGAN) 25 MG tablet  Take 12.5-25 mg by mouth every 6 (six) hours as needed.          .  tapentadol (NUCYNTA) 50 MG TABS  Take 50 mg by mouth 3 (three) times daily.           .  traMADol (ULTRAM) 50 MG tablet  Take 50 mg by mouth every 6 (six) hours as needed.           .  Bisacodyl-PEG-KCl-NaBicar-NaCl (HALFLYTELY BOWEL PREP) 5-210 MG-GM kit  Use as directed   1 each   0   .  polyethylene glycol powder (MIRALAX) powder  Take 17 g by mouth daily.   527 g   5    Allergies as of 03/30/2011 - Review Complete 03/30/2011   Allergen  Reaction  Noted   .  Celecoxib       .  Codeine       .  Oxycodone-acetaminophen    12/15/2010    Family History   Problem  Relation  Age of Onset   .  Cancer  Father     .  Diabetes  Mother     .  Cancer  Son         colon    History       Social History   .  Marital Status:  Widowed       Spouse Name:  N/A       Number of Children:  4   .  Years of Education:  N/A       Occupational History   .  retired         Social History Main Topics   .  Smoking status:  Never Smoker    .  Smokeless tobacco:  Never Used   .   Alcohol Use:  No   .  Drug Use:  No   .  Sexually Active:  No       Other Topics  Concern   .  Not on file       Social History Narrative     Lives w/ youngest son      Review of Systems: Gen: Denies any fever, chills, sweats, anorexia, fatigue, weakness, malaise, weight loss, and sleep disorder CV: Denies chest pain, angina, palpitations, syncope, orthopnea, PND, peripheral edema, and claudication. Resp: Denies dyspnea at rest, dyspnea with exercise, cough, sputum, wheezing, coughing up blood, and pleurisy. GI: Denies vomiting blood, jaundice, and fecal incontinence.   Denies dysphagia or odynophagia. GU : Denies urinary burning, blood in urine, urinary frequency, urinary hesitancy, nocturnal urination, and urinary incontinence. MS: Denies joint pain, limitation of movement, and swelling, stiffness, low back pain, extremity pain. Denies muscle weakness, cramps,  atrophy.   Derm: Denies rash, itching, dry skin, hives, moles, warts, or unhealing ulcers.   Psych: Denies depression, anxiety, memory loss, suicidal ideation, hallucinations, paranoia, and confusion. Heme: Denies bruising, bleeding, and enlarged lymph nodes.   Physical Exam: BP 127/78  Pulse 55  Temp(Src) 97.4 F (36.3 C) (Temporal)  Ht 5\' 5"  (1.651 m)  Wt 131 lb 9.6 oz (59.693 kg)  BMI 21.90 kg/m2 General:   Alert,  Well-developed, well-nourished, pleasant and cooperative in NAD Head:  Normocephalic and atraumatic. Eyes:  Sclera clear, no icterus.   Conjunctiva pink. Ears:  Normal auditory acuity. Nose:  No deformity, discharge,  or lesions. Mouth:  No deformity or lesions, dentition normal. Neck:  Supple; no masses or thyromegaly. Lungs:  Clear throughout to auscultation.   No wheezes, crackles, or rhonchi. No acute distress. Heart:  Regular rate and rhythm; no murmurs, clicks, rubs,  or gallops. Abdomen:  Soft, nontender and nondistended. No masses, hepatosplenomegaly or hernias noted. Normal bowel sounds,  without guarding, and without rebound.    Rectal:  Deferred until time of colonoscopy.    Msk:  Symmetrical without gross deformities. Normal posture. Pulses:  Normal pulses noted. Extremities:  Without clubbing or edema.  Left hip w/ freely mobile subcutaneous irregular nodule. Neurologic:  Alert and  oriented x4;  grossly normal neurologically. Skin:  Intact without significant lesions or rashes. Cervical Nodes:  No significant cervical adenopathy. Psych:  Alert and cooperative. Normal mood and affect.         Andrea Jackson  03/30/2011  3:37 PM  Signed Cc to PCP        ANEMIA, IRON DEFICIENCY - Lorenza Burton, NP  03/30/2011  1:18 PM  Signed Chronic in setting of NSAID-induced SB ulcers.  Pt tells me her last CBC was normal.  She may be able to decrease or discontinue her daily iron supplement.   Will request most recent CBC from [facility], [place].         Constipation - Lorenza Burton, NP  03/30/2011  1:21 PM  Addendum Chronic with bloating.  Add miralax.  Due for colonoscopy at this time.   I have discussed risks & benefits which include, but are not limited to, bleeding, infection, perforation & drug reaction.  The patient agrees with this plan & written consent will be obtained.   Procedure will need to be done with deep sedation (propofol) in the OR under the direction of anesthesia services for multiple psychoactive medications.    I have seen the patient prior to the procedure(s) today and reviewed the history and physical / consultation from 03/30/11.  There have been no changes. After consideration of the risks, benefits, alternatives and imponderables, the patient has consented to the procedure(s).

## 2011-04-14 ENCOUNTER — Encounter (HOSPITAL_COMMUNITY): Payer: Self-pay | Admitting: Internal Medicine

## 2011-07-21 ENCOUNTER — Encounter: Payer: Self-pay | Admitting: Cardiology

## 2011-07-22 ENCOUNTER — Ambulatory Visit (INDEPENDENT_AMBULATORY_CARE_PROVIDER_SITE_OTHER): Payer: Medicaid Other | Admitting: Cardiology

## 2011-07-22 ENCOUNTER — Encounter: Payer: Self-pay | Admitting: Cardiology

## 2011-07-22 VITALS — BP 127/76 | HR 49 | Ht 64.0 in | Wt 133.0 lb

## 2011-07-22 DIAGNOSIS — R001 Bradycardia, unspecified: Secondary | ICD-10-CM

## 2011-07-22 DIAGNOSIS — I2510 Atherosclerotic heart disease of native coronary artery without angina pectoris: Secondary | ICD-10-CM

## 2011-07-22 DIAGNOSIS — I10 Essential (primary) hypertension: Secondary | ICD-10-CM

## 2011-07-22 DIAGNOSIS — E782 Mixed hyperlipidemia: Secondary | ICD-10-CM

## 2011-07-22 DIAGNOSIS — I498 Other specified cardiac arrhythmias: Secondary | ICD-10-CM

## 2011-07-22 NOTE — Progress Notes (Signed)
Clinical Summary Andrea Jackson is a 77 y.o.female presenting for followup. She was seen back in April. She reports no progressive angina or shortness of breath. She continues to follow regularly with Dr. Neita Carp for lab work. She reports compliance with her medications.  She also continues to follow up with Dr. Jena Gauss, had a colonoscopy during the summer. She denies any active melena or hematochezia.  No palpitations or syncope. Followup ECG is reviewed and is stable.  She does report shoulder and left arm discomfort that sounds musculoskeletal in description. She states that this aches much of the time, particularly when she tries to lift something with her left arm such as a carton of milk.   Allergies  Allergen Reactions  . Celecoxib     REACTION: hyper, couldn't eat or sleep  . Codeine     REACTION: itching  . Oxycodone-Acetaminophen     Medication list reviewed.  Past Medical History  Diagnosis Date  . Hypertension   . Hyperlipidemia   . Coronary atherosclerosis of native coronary artery      NSTEMI 1/10, PTCA nondominant RCA 1/10, LVEF normal  . Arthritis   . Hypothyroidism   . Small bowel ulcers     GIVENS capsule study 03/24/2006, multiple areas of ulceration, mid-distal SB , Prometheus panel suggested Crohn's  . Iron deficiency anemia     sec chronic SB GI bleeding ulcers & chronic NSAIDs  . S/P colonoscopy 03/24/06    benign-Dr Rourk  . S/P endoscopy 03/24/06    benign-Dr. Jena Gauss  . Myocardial infarction   . GERD (gastroesophageal reflux disease)   . Anxiety     Past Surgical History  Procedure Date  . Appendectomy   . Cholecystectomy   . Partial hysterectomy   . Tonsillectomy   . Nose surgery     deviated septum repaired  . Hand surgery     right had finger joints replaced due to arthritis  . Cataract extraction     bilateral cataract extractions with iol   . Knee arthroscopy     right knee  . Colonoscopy 04/07/2011    Procedure: COLONOSCOPY;  Surgeon: Corbin Ade, MD;  Location: [facility];  Service: Gastroenterology;  Laterality: N/A;  In cecum@1244     withdrawal time- 7 minutes    Family History  Problem Relation Age of Onset  . Cancer Father   . Diabetes Mother   . Cancer Son     colon  . Anesthesia problems Neg Hx   . Hypotension Neg Hx   . Malignant hyperthermia Neg Hx   . Pseudochol deficiency Neg Hx     Social History [name] reports that she has never smoked. She has never used smokeless tobacco. [name] reports that she does not drink alcohol.  Review of Systems No palpitations or syncope. Stable appetite. Walking for exercise. Otherwise negative.  Physical Examination Filed Vitals:   07/22/11 0927  BP: 127/76  Pulse: 49   Normally nourished appearing woman in no acute distress.  HEENT: Conjunctiva and lids normal, oropharynx with moist mucosa.  Neck: Supple, no elevated JVP or bruits.  Lungs: Clear to auscultation, nonlabored.  Cardiac: Regular rate and rhythm, no S3 or pericardial rub.  Abdomen: Soft, nontender, bowel sounds present.  Skin: Warm and dry.  Extremities: No pitting edema, distal pulses full.  Musculoskeletal: No kyphosis.  Neuropsychiatric: Alert and oriented x3, affect appropriate.    ECG Reviewed in EMR.   Problem List and Plan

## 2011-07-22 NOTE — Assessment & Plan Note (Signed)
Symptomatically controlled on medical therapy. ECG stable. Continue observation.

## 2011-07-22 NOTE — Assessment & Plan Note (Signed)
Continue followup with Dr. Neita Carp. She has had statin intolerance. Taking flaxseed oil and omega-3 supplements.

## 2011-07-22 NOTE — Assessment & Plan Note (Signed)
Blood pressure well-controlled today. 

## 2011-07-22 NOTE — Patient Instructions (Signed)
Continue all current medications. Your physician wants you to follow up in: 6 months.  You will receive a reminder letter in the mail one-two months in advance.  If you don't receive a letter, please call our office to schedule the follow up appointment   

## 2011-08-19 ENCOUNTER — Telehealth: Payer: Self-pay | Admitting: *Deleted

## 2011-08-19 NOTE — Telephone Encounter (Signed)
Patient called requesting cardiac clearance for shoulder surgery.  Is seeing MD ([facility])  Dr. Adella Hare on Tuesday, 12/11.

## 2011-08-22 NOTE — Telephone Encounter (Signed)
Please scan in the actual preoperative surgical request form with contact information, forward this to me, and then I can append it was my recommendations.

## 2011-08-23 NOTE — Telephone Encounter (Signed)
Pt's surgeon is Dr. Simonne Come with [facility]. I s/w Toniann Fail to request clearance form. She states Dr. Simonne Come does not usually complete these forms. She will try to obtain a form and fax to [place].

## 2011-08-24 NOTE — Telephone Encounter (Signed)
Pt notified clearance will be faxed to [facility] today.

## 2012-02-10 ENCOUNTER — Encounter: Payer: Self-pay | Admitting: Cardiology

## 2012-02-10 ENCOUNTER — Ambulatory Visit (INDEPENDENT_AMBULATORY_CARE_PROVIDER_SITE_OTHER): Payer: PRIVATE HEALTH INSURANCE | Admitting: Cardiology

## 2012-02-10 VITALS — BP 114/67 | HR 52 | Ht 64.0 in | Wt 131.0 lb

## 2012-02-10 DIAGNOSIS — I10 Essential (primary) hypertension: Secondary | ICD-10-CM

## 2012-02-10 DIAGNOSIS — I2510 Atherosclerotic heart disease of native coronary artery without angina pectoris: Secondary | ICD-10-CM

## 2012-02-10 DIAGNOSIS — E782 Mixed hyperlipidemia: Secondary | ICD-10-CM

## 2012-02-10 NOTE — Assessment & Plan Note (Signed)
Blood pressure is well-controlled today. 

## 2012-02-10 NOTE — Patient Instructions (Signed)
Continue all current medications. Your physician wants you to follow up in: 6 months.  You will receive a reminder letter in the mail one-two months in advance.  If you don't receive a letter, please call our office to schedule the follow up appointment   

## 2012-02-10 NOTE — Assessment & Plan Note (Signed)
Continue followup with Dr. Neita Carp. She has had statin intolerance.

## 2012-02-10 NOTE — Progress Notes (Signed)
Clinical Summary Andrea Jackson is a 77 y.o.female presenting for followup. She was seen in November 2012. She states that she has been feeling well. Only a few episodes of chest pain. 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  She reports compliance with her medications, regular followup with Dr. Neita Jackson for lipid management. She sees him again next week.  She reports no obvious bleeding problems. No palpitations or syncope. She has had no progressive dyspnea on exertion, feels short of breath when she "bends over."   Allergies  Allergen Reactions  . Celecoxib     REACTION: hyper, couldn't eat or sleep  . Codeine     REACTION: itching  . Oxycodone-Acetaminophen     Current Outpatient Prescriptions  Medication Sig Dispense Refill  . aspirin EC 81 MG tablet Take 81 mg by mouth daily.      . clonazePAM (KLONOPIN) 0.5 MG tablet Take 0.25-0.5 mg by mouth at bedtime as needed. anxiety      . diclofenac (VOLTAREN) 75 MG EC tablet Take 75 mg by mouth 2 (two) times daily.        Marland Kitchen esomeprazole (NEXIUM) 40 MG capsule Take 40 mg by mouth daily before breakfast.        . ferrous sulfate 325 (65 FE) MG tablet Take 325 mg by mouth daily with breakfast.       . fexofenadine (ALLEGRA) 180 MG tablet Take 180 mg by mouth daily as needed.        . Flaxseed, Linseed, (FLAX SEED OIL) 1000 MG CAPS Take 1 capsule by mouth at bedtime.       . hydrochlorothiazide (MICROZIDE) 12.5 MG capsule Take 12.5 mg by mouth daily.        . isosorbide mononitrate (IMDUR) 30 MG 24 hr tablet Take 15 mg by mouth daily.        Marland Kitchen levothyroxine (SYNTHROID, LEVOTHROID) 100 MCG tablet Take 100 mcg by mouth daily.        Marland Kitchen lisinopril (PRINIVIL,ZESTRIL) 40 MG tablet Take 40 mg by mouth daily.       . metoprolol tartrate (LOPRESSOR) 25 MG tablet Take 12.5 mg by mouth 2 (two) times daily.       . nitroGLYCERIN (NITROSTAT) 0.4 MG SL tablet Place 0.4 mg under the tongue every 5 (five)  minutes as needed. Chest pain      . Omega-3 Fatty Acids (FISH OIL) 1000 MG CAPS Take 1 capsule by mouth daily.        . promethazine (PHENERGAN) 25 MG tablet Take 12.5-25 mg by mouth every 6 (six) hours as needed. Nausea,vomiting      . traMADol (ULTRAM) 50 MG tablet Take 50 mg by mouth every 6 (six) hours as needed. For pain        Past Medical History  Diagnosis Date  . Hypertension   . Hyperlipidemia   . Coronary atherosclerosis of native coronary artery      NSTEMI 1/10, PTCA nondominant RCA 1/10, LVEF normal  . Arthritis   . Hypothyroidism   . Small bowel ulcers     GIVENS capsule study 03/24/2006, multiple areas of ulceration, mid-distal SB , Prometheus panel suggested Crohn's  . Iron deficiency anemia     sec chronic SB GI bleeding ulcers & chronic NSAIDs  . S/P colonoscopy 03/24/06    benign-Dr Rourk  . S/P endoscopy 03/24/06    benign-Dr. Jena Gauss  . Myocardial  infarction   . GERD (gastroesophageal reflux disease)   . Anxiety     Social History Andrea Jackson reports that she has never smoked. She has never used smokeless tobacco. Andrea Jackson reports that she does not drink alcohol.  Review of Systems Negative except as outlined above.  Physical Examination Filed Vitals:   02/10/12 0929  BP: 114/67  Pulse: 52    Normally nourished appearing woman in no acute distress.  HEENT: Conjunctiva and lids normal, oropharynx with moist mucosa.  Neck: Supple, no elevated JVP or bruits.  Lungs: Clear to auscultation, nonlabored.  Cardiac: Regular rate and rhythm, no S3 or pericardial rub.  Abdomen: Soft, nontender, bowel sounds present.  Skin: Warm and dry.  Extremities: No pitting edema, distal pulses full.    ECG Reviewed in EMR.  Problem List and Plan   CORONARY ATHEROSCLEROSIS NATIVE CORONARY ARTERY Symptomatically stable on medical therapy. No changes made today. Continue observation. ECG reviewed.  ESSENTIAL HYPERTENSION, BENIGN Blood pressure is well controlled  today.  Mixed hyperlipidemia Continue followup with Dr. Neita Jackson. She has had statin intolerance.     Andrea Jackson, M.D., F.A.C.C.

## 2012-02-10 NOTE — Assessment & Plan Note (Signed)
Symptomatically stable on medical therapy. No changes made today. Continue observation. ECG reviewed. 

## 2012-03-27 DIAGNOSIS — I200 Unstable angina: Secondary | ICD-10-CM

## 2012-03-28 DIAGNOSIS — I2510 Atherosclerotic heart disease of native coronary artery without angina pectoris: Secondary | ICD-10-CM

## 2012-05-13 DIAGNOSIS — K56609 Unspecified intestinal obstruction, unspecified as to partial versus complete obstruction: Secondary | ICD-10-CM

## 2012-05-13 HISTORY — DX: Unspecified intestinal obstruction, unspecified as to partial versus complete obstruction: K56.609

## 2012-11-14 ENCOUNTER — Telehealth: Payer: Self-pay | Admitting: *Deleted

## 2012-11-14 NOTE — Telephone Encounter (Signed)
Darl Pikes made appt for pt.

## 2012-11-14 NOTE — Telephone Encounter (Signed)
Pt is aware of OV for tomorrow 3/6 at 0830 with KJ

## 2012-11-14 NOTE — Telephone Encounter (Signed)
Please call Ms Agramonte back on her cell phone. Thank you.

## 2012-11-14 NOTE — Telephone Encounter (Signed)
Andrea Jackson called this am. She is experiencing stomach pain. She believes she has an obstruction, however phenergan has helped her with the discomfort. I made an appt for her on March 26, but she would like someone to call her back.

## 2012-11-14 NOTE — Telephone Encounter (Signed)
Would back off to a clear liquid diet for today.  May be best to go ahead just plan for CT of abdomen and pelvis tomorrow with IV and oral contrast. Please order for the a.m. Further recommendations to follow. If symptoms worsen, she needs to go to the ER

## 2012-11-14 NOTE — Telephone Encounter (Signed)
Per RMR- disregard last message. Pt has not been seen in the office in 2 years. He wants pt to have an ov either this afternoon or tomorrow morning.   Darl Pikes, please try to get pt an asap appt this afternoon or tomorrow.

## 2012-11-14 NOTE — Telephone Encounter (Signed)
Spoke with pt- she stated this has been going on for 2-3 months, she went to [facility] 2-3 months ago and was told she had a SB obstruction and they wanted to operate but she wouldn't let them. About 2 weeks ago, she had a "boil on her rearend" and was given a sulfur abx to take and ended up constipated. Last week she had an episode of being unable to pass gas, bloating and vomiting, she went to Dr. Rosine Door and he told her she had a partial bowel obstruction, but since she was vomiting, he felt like she had passed the obstruction (per pt). Pt had an episode last night of abd pain, took pepto and then took tramadol with no relief, finally took phenergan around 4am and felt a little better. She had a small bm this morning and doesn't have an appitite, hurts all the time and when she eats/drinks. No fever, no more vomiting. Pt is taking miralax 1 capful daily. I have asked Dawn to get records from [facility].    Pt has appointment to come in on March 26th but wants to know what she should do until her appt. Please advise.

## 2012-11-15 ENCOUNTER — Ambulatory Visit (INDEPENDENT_AMBULATORY_CARE_PROVIDER_SITE_OTHER): Payer: PRIVATE HEALTH INSURANCE | Admitting: Urgent Care

## 2012-11-15 ENCOUNTER — Encounter: Payer: Self-pay | Admitting: Urgent Care

## 2012-11-15 VITALS — BP 132/78 | HR 58 | Temp 97.2°F | Ht 64.0 in | Wt 131.8 lb

## 2012-11-15 DIAGNOSIS — K5909 Other constipation: Secondary | ICD-10-CM | POA: Insufficient documentation

## 2012-11-15 DIAGNOSIS — G8929 Other chronic pain: Secondary | ICD-10-CM

## 2012-11-15 DIAGNOSIS — K5900 Constipation, unspecified: Secondary | ICD-10-CM

## 2012-11-15 DIAGNOSIS — R1084 Generalized abdominal pain: Secondary | ICD-10-CM | POA: Insufficient documentation

## 2012-11-15 DIAGNOSIS — K56609 Unspecified intestinal obstruction, unspecified as to partial versus complete obstruction: Secondary | ICD-10-CM | POA: Insufficient documentation
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
## 2012-11-15 NOTE — Patient Instructions (Addendum)
MiraLax 17 g daily I will review CT report from [facility] with Dr. Jena Gauss and make further suggestions You may use Phenergan you have for nausea If severe pain or unable to keep down liquids, you should go directly to the emergency room

## 2012-11-16 ENCOUNTER — Telehealth: Payer: Self-pay | Admitting: Urgent Care

## 2012-11-16 ENCOUNTER — Encounter: Payer: Self-pay | Admitting: Urgent Care

## 2012-11-16 NOTE — Assessment & Plan Note (Signed)
See SBO

## 2012-11-16 NOTE — Assessment & Plan Note (Signed)
Increase Miralax to 17 grams daily

## 2012-11-16 NOTE — Assessment & Plan Note (Addendum)
Andrea Jackson is a pleasant 79 y.o. female with chronic abdominal pain, abdominal surgical adhesive disease & hx small bowel obstruction.  She also has hx small bowel ulcers on Givens capsule study & prometheus panel suggestive of Crohn's disease.  C/o constipation & nausea.  I suspect she may be having intermittent recurrent small bowel obstructions.  I discussed with Dr Jena Gauss & our plan is below.  CT Enterography We suspect she will need appt at [facility] with Dr Rockie Neighbours pending review of CTE. You may use Phenergan you have for nausea If severe pain or unable to keep down liquids, go directly to the emergency room

## 2012-11-16 NOTE — Telephone Encounter (Signed)
Please call pt to schedule CT enterography re:hx SBO, multiple abd surgeries & small bowel ulcers Thanks

## 2012-11-19 ENCOUNTER — Other Ambulatory Visit: Payer: Self-pay | Admitting: Internal Medicine

## 2012-11-19 ENCOUNTER — Other Ambulatory Visit: Payer: Self-pay | Admitting: Urgent Care

## 2012-11-19 DIAGNOSIS — Z9889 Other specified postprocedural states: Secondary | ICD-10-CM

## 2012-11-19 DIAGNOSIS — Z8719 Personal history of other diseases of the digestive system: Secondary | ICD-10-CM

## 2012-11-19 NOTE — Progress Notes (Signed)
Faxed to PCP

## 2012-11-20 ENCOUNTER — Other Ambulatory Visit: Payer: Self-pay | Admitting: Urgent Care

## 2012-11-20 DIAGNOSIS — Z8719 Personal history of other diseases of the digestive system: Secondary | ICD-10-CM

## 2012-11-20 NOTE — Telephone Encounter (Signed)
Patient is scheduled for CT Enterography on Wed March 12th at 8:45 and I have faxed an order for a creatinine to [facility] and she is aware to go have labs done today and CT tomorrow

## 2012-11-21 ENCOUNTER — Ambulatory Visit (HOSPITAL_COMMUNITY)
Admission: RE | Admit: 2012-11-21 | Discharge: 2012-11-21 | Disposition: A | Discharge status: Home / Self Care | Payer: PRIVATE HEALTH INSURANCE | Source: Ambulatory Visit | Attending: Urgent Care | Admitting: Urgent Care

## 2012-11-21 ENCOUNTER — Inpatient Hospital Stay (HOSPITAL_COMMUNITY): Admission: RE | Admit: 2012-11-21 | Payer: PRIVATE HEALTH INSURANCE | Source: Ambulatory Visit

## 2012-11-21 DIAGNOSIS — R933 Abnormal findings on diagnostic imaging of other parts of digestive tract: Secondary | ICD-10-CM | POA: Insufficient documentation

## 2012-11-21 DIAGNOSIS — Z8719 Personal history of other diseases of the digestive system: Secondary | ICD-10-CM

## 2012-11-21 DIAGNOSIS — R109 Unspecified abdominal pain: Secondary | ICD-10-CM | POA: Insufficient documentation

## 2012-11-21 DIAGNOSIS — Z9049 Acquired absence of other specified parts of digestive tract: Secondary | ICD-10-CM | POA: Insufficient documentation

## 2012-11-21 DIAGNOSIS — Z9889 Other specified postprocedural states: Secondary | ICD-10-CM

## 2012-11-21 DIAGNOSIS — R9389 Abnormal findings on diagnostic imaging of other specified body structures: Secondary | ICD-10-CM | POA: Insufficient documentation

## 2012-11-21 LAB — CREATININE, SERUM: Creat: 1.04 mg/dL (ref 0.50–1.10)

## 2012-11-21 MED ORDER — IOHEXOL 300 MG/ML  SOLN: 125.0000 mL | Freq: Once | INTRAMUSCULAR | Status: AC | PRN

## 2012-11-21 NOTE — Progress Notes (Signed)
Results faxed to PCP 

## 2012-11-21 NOTE — Progress Notes (Signed)
Quick Note:  Normal CC: Estanislado Pandy, MD  ______

## 2012-11-26 ENCOUNTER — Other Ambulatory Visit: Payer: Self-pay | Admitting: Urgent Care

## 2012-11-26 ENCOUNTER — Telehealth: Payer: Self-pay | Admitting: Urgent Care

## 2012-11-26 NOTE — Telephone Encounter (Signed)
LMOM for return call to discuss CTE results Thanks

## 2012-11-26 NOTE — Progress Notes (Signed)
Quick Note:  Dr Jena Gauss reviewed CTE. Needs MRI w/wo contrast renal lesion, compare with CTE  Plan per RMR: Lets get bowels moving better - lets get more aggressive for a goal of at least 3 BMs weekly. Prior capsule findings noted. Finding on current study stable/improved compared to prior CT. I'd be hesitant to give her a Dx of Crohns (particularly with no diarrhea) and current findings. Please pass follow-up on to one of the remaining extenders - if with persisting SX, would re-visit small bowel w capsule (patency evaluation first).   XB:JYNWGN,FAOZ W, MD        ______

## 2012-11-26 NOTE — Progress Notes (Signed)
Patient is scheduled for MRI Abd w/w/o cm on Thursday 3/20 and she is aware

## 2012-11-28 NOTE — Telephone Encounter (Signed)
I called pt because she is scheduled for her MRI tomorrow at 7:45am and has to be NPO after midnight. She stated she would be able to go to this appt.  Informed her that Doristine Church would be calling her back to discuss her medications.

## 2012-11-29 ENCOUNTER — Ambulatory Visit (HOSPITAL_COMMUNITY)
Admission: RE | Admit: 2012-11-29 | Discharge: 2012-11-29 | Disposition: A | Discharge status: Home / Self Care | Payer: PRIVATE HEALTH INSURANCE | Source: Ambulatory Visit | Attending: Urgent Care | Admitting: Urgent Care

## 2012-11-29 ENCOUNTER — Encounter (HOSPITAL_COMMUNITY): Payer: Self-pay

## 2012-11-29 DIAGNOSIS — Q619 Cystic kidney disease, unspecified: Secondary | ICD-10-CM | POA: Insufficient documentation

## 2012-11-29 DIAGNOSIS — N289 Disorder of kidney and ureter, unspecified: Secondary | ICD-10-CM

## 2012-11-29 DIAGNOSIS — K7689 Other specified diseases of liver: Secondary | ICD-10-CM | POA: Insufficient documentation

## 2012-11-29 MED ORDER — GADOBENATE DIMEGLUMINE 529 MG/ML IV SOLN
12.0000 mL | Freq: Once | INTRAVENOUS | Status: AC | PRN
  Administered 2012-11-29: 12 mL via INTRAVENOUS

## 2012-11-29 NOTE — Telephone Encounter (Signed)
Results given to patient.  MRI showed 10 mm benign cyst in the lower pole of the left kidney, corresponding to the indeterminate lesion on recent CT. No evidence of malignancy or other significant abnormality. Pt should take Miralax 17 grams most days of the week.  She already has Rx at home.  Uses dulcolax 2 daily prn.  Pt feels much better. Needs FU OV w/ Rourk in 3 mo Please cancel OV next week. Thanks UE:[email or web address],JXBJ W, MD

## 2012-11-30 NOTE — Telephone Encounter (Signed)
appt cancelled, recall made, results faxed to PCP

## 2012-12-05 ENCOUNTER — Ambulatory Visit: Payer: PRIVATE HEALTH INSURANCE | Admitting: Gastroenterology

## 2013-01-30 ENCOUNTER — Encounter (HOSPITAL_COMMUNITY): Payer: Self-pay

## 2013-01-30 ENCOUNTER — Inpatient Hospital Stay (HOSPITAL_COMMUNITY): Payer: PRIVATE HEALTH INSURANCE

## 2013-01-30 ENCOUNTER — Emergency Department (HOSPITAL_COMMUNITY): Payer: PRIVATE HEALTH INSURANCE

## 2013-01-30 ENCOUNTER — Observation Stay (HOSPITAL_COMMUNITY)
Admission: EM | Admit: 2013-01-30 | Discharge: 2013-01-31 | Disposition: A | Discharge status: Home / Self Care | Payer: PRIVATE HEALTH INSURANCE | Attending: Cardiology | Admitting: Cardiology

## 2013-01-30 DIAGNOSIS — R079 Chest pain, unspecified: Secondary | ICD-10-CM

## 2013-01-30 DIAGNOSIS — Z79899 Other long term (current) drug therapy: Secondary | ICD-10-CM | POA: Insufficient documentation

## 2013-01-30 DIAGNOSIS — E782 Mixed hyperlipidemia: Secondary | ICD-10-CM | POA: Diagnosis present

## 2013-01-30 DIAGNOSIS — M542 Cervicalgia: Secondary | ICD-10-CM | POA: Insufficient documentation

## 2013-01-30 DIAGNOSIS — E785 Hyperlipidemia, unspecified: Secondary | ICD-10-CM

## 2013-01-30 DIAGNOSIS — I2510 Atherosclerotic heart disease of native coronary artery without angina pectoris: Secondary | ICD-10-CM | POA: Insufficient documentation

## 2013-01-30 DIAGNOSIS — R072 Precordial pain: Principal | ICD-10-CM | POA: Insufficient documentation

## 2013-01-30 DIAGNOSIS — R51 Headache: Secondary | ICD-10-CM | POA: Insufficient documentation

## 2013-01-30 DIAGNOSIS — E039 Hypothyroidism, unspecified: Secondary | ICD-10-CM | POA: Diagnosis present

## 2013-01-30 DIAGNOSIS — I10 Essential (primary) hypertension: Secondary | ICD-10-CM | POA: Diagnosis present

## 2013-01-30 DIAGNOSIS — R0602 Shortness of breath: Secondary | ICD-10-CM | POA: Insufficient documentation

## 2013-01-30 DIAGNOSIS — I200 Unstable angina: Secondary | ICD-10-CM

## 2013-01-30 DIAGNOSIS — I252 Old myocardial infarction: Secondary | ICD-10-CM | POA: Insufficient documentation

## 2013-01-30 DIAGNOSIS — R0789 Other chest pain: Secondary | ICD-10-CM | POA: Diagnosis present

## 2013-01-30 LAB — CBC WITH DIFFERENTIAL/PLATELET
Basophils Absolute: 0 10*3/uL (ref 0.0–0.1)
Basophils Relative: 1 % (ref 0–1)
HCT: 38.4 % (ref 36.0–46.0)
Lymphocytes Relative: 30 % (ref 12–46)
MCHC: 31.8 g/dL (ref 30.0–36.0)
Monocytes Absolute: 0.4 10*3/uL (ref 0.1–1.0)
Neutro Abs: 2.9 10*3/uL (ref 1.7–7.7)
Neutrophils Relative %: 57 % (ref 43–77)
Platelets: 229 10*3/uL (ref 150–400)
RDW: 17.1 % — ABNORMAL HIGH (ref 11.5–15.5)
WBC: 5.1 10*3/uL (ref 4.0–10.5)

## 2013-01-30 LAB — COMPREHENSIVE METABOLIC PANEL
ALT: 18 U/L (ref 0–35)
AST: 21 U/L (ref 0–37)
Albumin: 3.3 g/dL — ABNORMAL LOW (ref 3.5–5.2)
Chloride: 102 mEq/L (ref 96–112)
Creatinine, Ser: 1.05 mg/dL (ref 0.50–1.10)
Sodium: 138 mEq/L (ref 135–145)
Total Bilirubin: 0.2 mg/dL — ABNORMAL LOW (ref 0.3–1.2)

## 2013-01-30 LAB — URINALYSIS, ROUTINE W REFLEX MICROSCOPIC
Glucose, UA: NEGATIVE mg/dL
Hgb urine dipstick: NEGATIVE
Ketones, ur: NEGATIVE mg/dL
Leukocytes, UA: NEGATIVE
pH: 5.5 (ref 5.0–8.0)

## 2013-01-30 LAB — PROTIME-INR: INR: 1.03 (ref 0.00–1.49)

## 2013-01-30 LAB — MAGNESIUM: Magnesium: 2.1 mg/dL (ref 1.5–2.5)

## 2013-01-30 MED ORDER — ONDANSETRON HCL 4 MG/2ML IJ SOLN
4.0000 mg | Freq: Three times a day (TID) | INTRAMUSCULAR | Status: DC | PRN
  Administered 2013-01-30: 4 mg via INTRAVENOUS
  Filled 2013-01-30: qty 2

## 2013-01-30 MED ORDER — SODIUM CHLORIDE 0.9 % IV SOLN
INTRAVENOUS | Status: DC
  Administered 2013-01-30: 22:00:00 via INTRAVENOUS

## 2013-01-30 MED ORDER — MORPHINE SULFATE 4 MG/ML IJ SOLN
4.0000 mg | Freq: Once | INTRAMUSCULAR | Status: AC
  Administered 2013-01-30: 4 mg via INTRAVENOUS
  Filled 2013-01-30: qty 1

## 2013-01-30 MED ORDER — ATORVASTATIN CALCIUM 20 MG PO TABS
20.0000 mg | ORAL_TABLET | Freq: Every day | ORAL | Status: DC
  Filled 2013-01-30 (×2): qty 1

## 2013-01-30 MED ORDER — PANTOPRAZOLE SODIUM 40 MG PO TBEC
40.0000 mg | DELAYED_RELEASE_TABLET | Freq: Every day | ORAL | Status: DC
  Administered 2013-01-31: 40 mg via ORAL
  Filled 2013-01-30: qty 1

## 2013-01-30 MED ORDER — GI COCKTAIL ~~LOC~~
30.0000 mL | Freq: Once | ORAL | Status: AC
  Administered 2013-01-30: 30 mL via ORAL
  Filled 2013-01-30: qty 30

## 2013-01-30 MED ORDER — ASPIRIN 81 MG PO CHEW
CHEWABLE_TABLET | ORAL | Status: AC
  Filled 2013-01-30: qty 3

## 2013-01-30 MED ORDER — ASPIRIN 81 MG PO CHEW
324.0000 mg | CHEWABLE_TABLET | ORAL | Status: AC
  Administered 2013-01-30: 324 mg via ORAL
  Filled 2013-01-30: qty 1

## 2013-01-30 MED ORDER — NITROGLYCERIN 0.4 MG SL SUBL
0.4000 mg | SUBLINGUAL_TABLET | SUBLINGUAL | Status: DC | PRN
  Administered 2013-01-30 (×2): 0.4 mg via SUBLINGUAL

## 2013-01-30 MED ORDER — NITROGLYCERIN 0.4 MG SL SUBL: 0.4000 mg | SUBLINGUAL_TABLET | SUBLINGUAL | Status: DC | PRN

## 2013-01-30 MED ORDER — LEVOTHYROXINE SODIUM 137 MCG PO TABS
137.0000 ug | ORAL_TABLET | Freq: Every day | ORAL | Status: DC
  Administered 2013-01-31: 137 ug via ORAL
  Filled 2013-01-30 (×2): qty 1

## 2013-01-30 MED ORDER — NITROGLYCERIN 0.4 MG SL SUBL
0.4000 mg | SUBLINGUAL_TABLET | SUBLINGUAL | Status: DC | PRN
  Filled 2013-01-30: qty 25

## 2013-01-30 MED ORDER — LISINOPRIL 40 MG PO TABS
40.0000 mg | ORAL_TABLET | Freq: Every day | ORAL | Status: DC
  Administered 2013-01-31: 40 mg via ORAL
  Filled 2013-01-30: qty 1

## 2013-01-30 MED ORDER — OMEGA-3-ACID ETHYL ESTERS 1 G PO CAPS
1.0000 g | ORAL_CAPSULE | Freq: Two times a day (BID) | ORAL | Status: DC
  Administered 2013-01-30 – 2013-01-31 (×2): 1 g via ORAL
  Filled 2013-01-30 (×3): qty 1

## 2013-01-30 MED ORDER — HEPARIN (PORCINE) IN NACL 100-0.45 UNIT/ML-% IJ SOLN
850.0000 [IU]/h | INTRAMUSCULAR | Status: DC
  Administered 2013-01-30: 850 [IU]/h via INTRAVENOUS
  Filled 2013-01-30: qty 250

## 2013-01-30 MED ORDER — ONDANSETRON HCL 4 MG/2ML IJ SOLN: 4.0000 mg | Freq: Four times a day (QID) | INTRAMUSCULAR | Status: DC | PRN

## 2013-01-30 MED ORDER — ASPIRIN 300 MG RE SUPP
300.0000 mg | RECTAL | Status: AC
  Filled 2013-01-30: qty 1

## 2013-01-30 MED ORDER — ONDANSETRON HCL 4 MG/2ML IJ SOLN
4.0000 mg | Freq: Once | INTRAMUSCULAR | Status: AC
  Administered 2013-01-30: 4 mg via INTRAVENOUS
  Filled 2013-01-30: qty 2

## 2013-01-30 MED ORDER — HYDROCHLOROTHIAZIDE 12.5 MG PO CAPS
12.5000 mg | ORAL_CAPSULE | Freq: Every day | ORAL | Status: DC | PRN
  Filled 2013-01-30: qty 1

## 2013-01-30 MED ORDER — CLONAZEPAM 0.5 MG PO TABS
0.2500 mg | ORAL_TABLET | Freq: Every evening | ORAL | Status: DC | PRN
  Administered 2013-01-30: 0.5 mg via ORAL
  Filled 2013-01-30: qty 1

## 2013-01-30 MED ORDER — TRAMADOL HCL 50 MG PO TABS
50.0000 mg | ORAL_TABLET | Freq: Four times a day (QID) | ORAL | Status: DC | PRN
  Administered 2013-01-30 – 2013-01-31 (×2): 50 mg via ORAL
  Filled 2013-01-30 (×2): qty 1

## 2013-01-30 MED ORDER — ISOSORBIDE MONONITRATE 15 MG HALF TABLET
15.0000 mg | ORAL_TABLET | Freq: Every day | ORAL | Status: DC
  Administered 2013-01-31: 15 mg via ORAL
  Filled 2013-01-30: qty 1

## 2013-01-30 MED ORDER — ACETAMINOPHEN 325 MG PO TABS: 650.0000 mg | ORAL_TABLET | ORAL | Status: DC | PRN

## 2013-01-30 MED ORDER — HEPARIN BOLUS VIA INFUSION
3000.0000 [IU] | Freq: Once | INTRAVENOUS | Status: AC
  Administered 2013-01-30: 3000 [IU] via INTRAVENOUS
  Filled 2013-01-30: qty 3000

## 2013-01-30 MED ORDER — METOPROLOL TARTRATE 12.5 MG HALF TABLET
12.5000 mg | ORAL_TABLET | Freq: Two times a day (BID) | ORAL | Status: DC
  Administered 2013-01-30 – 2013-01-31 (×2): 12.5 mg via ORAL
  Filled 2013-01-30 (×3): qty 1

## 2013-01-30 MED ORDER — ASPIRIN EC 81 MG PO TBEC
81.0000 mg | DELAYED_RELEASE_TABLET | Freq: Every day | ORAL | Status: DC
  Administered 2013-01-31: 81 mg via ORAL
  Filled 2013-01-30: qty 1

## 2013-01-30 NOTE — ED Notes (Signed)
Report given to [facility] at this time.  

## 2013-01-30 NOTE — ED Notes (Signed)
Complain of headache and chest pain

## 2013-01-30 NOTE — ED Notes (Signed)
Pt refused the 3rd sublingual nitro stating that the 1st two did not help. Dr. Manus Gunning made aware.
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
## 2013-01-30 NOTE — Care Management Note (Signed)
    Page 1 of 1   01/30/2013     4:11:16 PM   CARE MANAGEMENT NOTE 01/30/2013  Patient:  Andrea Jackson, Andrea Jackson   Account Number:  [id number]  Date Initiated:  01/30/2013  Documentation initiated by:  Junius Creamer  Subjective/Objective Assessment:   adm w angina     Action/Plan:   lives alone, pcp  dr Renae Fickle sasser   Anticipated DC Date:     Anticipated DC Plan:        DC Planning Services  CM consult      Choice offered to / List presented to:             Status of service:   Medicare Important Message given?   (If response is "NO", the following Medicare IM given date fields will be blank) Date Medicare IM given:   Date Additional Medicare IM given:    Discharge Disposition:    Per UR Regulation:  Reviewed for med. necessity/level of care/duration of stay  If discussed at Long Length of Stay Meetings, dates discussed:    Comments:

## 2013-01-30 NOTE — Progress Notes (Signed)
Paged Dr. Daleen Squibb at this time.
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
## 2013-01-31 DIAGNOSIS — R0789 Other chest pain: Secondary | ICD-10-CM | POA: Diagnosis present

## 2013-01-31 DIAGNOSIS — R079 Chest pain, unspecified: Secondary | ICD-10-CM

## 2013-01-31 LAB — BASIC METABOLIC PANEL
CO2: 28 mEq/L (ref 19–32)
Chloride: 108 mEq/L (ref 96–112)
Creatinine, Ser: 0.98 mg/dL (ref 0.50–1.10)
Sodium: 143 mEq/L (ref 135–145)

## 2013-01-31 LAB — LIPID PANEL
LDL Cholesterol: 123 mg/dL — ABNORMAL HIGH (ref 0–99)
Triglycerides: 151 mg/dL — ABNORMAL HIGH (ref <150)

## 2013-01-31 LAB — TROPONIN I: Troponin I: <0.3 ng/mL (ref <0.30)

## 2013-01-31 MED ORDER — ATORVASTATIN CALCIUM 20 MG PO TABS: 20.0000 mg | ORAL_TABLET | Freq: Every day | ORAL | Status: DC

## 2013-01-31 MED ORDER — GI COCKTAIL ~~LOC~~
30.0000 mL | Freq: Three times a day (TID) | ORAL | Status: DC | PRN
  Administered 2013-01-31: 30 mL via ORAL
  Filled 2013-01-31: qty 30

## 2013-01-31 MED ORDER — HYDROCHLOROTHIAZIDE 12.5 MG PO CAPS: 12.5000 mg | ORAL_CAPSULE | Freq: Every day | ORAL | Status: DC | PRN

## 2013-01-31 NOTE — Discharge Summary (Signed)
CARDIOLOGY DISCHARGE SUMMARY   Patient ID: Andrea Jackson MRN: [id number] DOB/AGE: 1934/08/18 79 y.o.  Admit date: 01/30/2013 Discharge date: 01/31/2013  Primary Discharge Diagnosis:    Chest pain, mid sternal Secondary Discharge Diagnosis:    HYPOTHYROIDISM   Mixed hyperlipidemia   Essential hypertension, benign  Procedures:  CT of head without contrast, chest x-ray  Hospital Course: Andrea Jackson is a 79 y.o. female with a history of CAD. She had prolonged chest pain and went to [facility]. She was treated there but there was concern for cardiac source of her chest pain. She was transferred to [facility] and admitted for further evaluation and treatment.  Her cardiac enzymes were negative for MI. There was concern for a GI origin for her symptoms. She was given a GI cocktail and her symptoms improved. She was continued on her daily proton pump inhibitor. She is to followup with GI. Her labs were reviewed and showed a minimally elevated TSH. She is to followup with her primary care physician for this and no medication changes were made her Synthroid. She had sinus bradycardia on telemetry but was asymptomatic with this in the disc change was made in her beta blocker. A lipid profile was reviewed by Dr. Tenny Craw and showed hyperlipidemia. She was started on a statin and will followup as an outpatient. Compliant with a low cholesterol diet is encouraged.  On 01/31/2013, she continued to have intermittent pain at rest but her ECG and cardiac enzymes were remaining negative. Dr. Tenny Craw evaluated Andrea Jackson and felt that her symptoms were most likely GI in origin. Dr. Tenny Craw evaluated Andrea Jackson and considered her stable for discharge, to have early GI followup and followup with cardiology as well.  Labs:   Lab Results  Component Value Date   WBC 5.1 01/30/2013   HGB 12.2 01/30/2013   HCT 38.4 01/30/2013   MCV 88.1 01/30/2013   PLT 229 01/30/2013     Recent Labs Lab 01/30/13 1138  01/31/13 0450  NA 138 143  K 5.0 4.4  CL 102 108  CO2 29 28  BUN 24* 19  CREATININE 1.05 0.98  CALCIUM 9.0 8.4  PROT 6.2  --   BILITOT 0.2*  --   ALKPHOS 72  --   ALT 18  --   AST 21  --   GLUCOSE 98 86    Recent Labs  01/30/13 1644 01/30/13 2220 01/31/13 0450  TROPONINI <0.30 <0.30 <0.30   Lipid Panel     Component Value Date/Time   CHOL 204* 01/31/2013 0450   TRIG 151* 01/31/2013 0450   HDL 51 01/31/2013 0450   CHOLHDL 4.0 01/31/2013 0450   VLDL 30 01/31/2013 0450   LDLCALC 123* 01/31/2013 0450    Recent Labs  01/30/13 1644  INR 1.03   Lab Results  Component Value Date   TSH 6.834* 01/30/2013     Radiology: Dg Chest 2 View 01/30/2013   *RADIOLOGY REPORT*  Clinical Data: Chest pain.  CHEST - 2 VIEW  Comparison: 09/19/2012  Findings: The heart size and pulmonary vascularity are normal and the lungs are clear.  No osseous abnormality.  IMPRESSION: Normal chest.   Original Report Authenticated By: Francene Boyers, M.D.   Ct Head Wo Contrast 01/30/2013   *RADIOLOGY REPORT*  Clinical Data: Headache, chest pain, history coronary artery disease post MI, hypertension  CT HEAD WITHOUT CONTRAST  Technique:  Contiguous axial images were obtained from the base of the skull through the vertex without  contrast.  Comparison: None  Findings: Generalized atrophy. Normal ventricular morphology. No midline shift or mass effect. Small vessel chronic ischemic changes of deep cerebral white matter. No intracranial hemorrhage, mass lesion, or acute infarction. Visualized paranasal sinuses and mastoid air cells clear. Bones unremarkable. Atherosclerotic calcifications at skull base.  IMPRESSION: Age-related atrophy with small vessel chronic ischemic changes of deep cerebral white matter. No acute intracranial abnormalities.   Original Report Authenticated By: Ulyses Southward, M.D.   EKG: 31-Jan-2013 07:28:51   Sinus bradycardia Left anterior fascicular block Abnormal ECG 76mm/s 53mm/mV 100Hz  8.0.1  12SL 241 HD CID: 1 Referred by: T WALL Unconfirmed Vent. rate 53 BPM PR interval 208 ms QRS duration 82 ms QT/QTc 448/420 ms P-R-T axes 67 -46 15  FOLLOW UP PLANS AND APPOINTMENTS Allergies  Allergen Reactions  . Celecoxib     REACTION: hyper, couldn't eat or sleep  . Codeine     REACTION: itching  . Oxycodone-Acetaminophen Itching     Medication List    TAKE these medications       aspirin EC 81 MG tablet  Take 81 mg by mouth daily.     atorvastatin 20 MG tablet  Commonly known as:  LIPITOR  Take 1 tablet (20 mg total) by mouth daily at 6 PM.     clonazePAM 0.5 MG tablet  Commonly known as:  KLONOPIN  Take 0.25-0.5 mg by mouth at bedtime as needed. anxiety     esomeprazole 40 MG capsule  Commonly known as:  NEXIUM  Take 40 mg by mouth daily before breakfast.     ferrous sulfate 325 (65 FE) MG tablet  Take 325 mg by mouth daily with breakfast.     Fish Oil 1000 MG Caps  Take 1 capsule by mouth 2 (two) times daily.     hydrochlorothiazide 12.5 MG capsule  Commonly known as:  MICROZIDE  Take 1 capsule (12.5 mg total) by mouth daily as needed (if blood pressure is high).     isosorbide mononitrate 30 MG 24 hr tablet  Commonly known as:  IMDUR  Take 15 mg by mouth daily.     levothyroxine 137 MCG tablet  Commonly known as:  SYNTHROID, LEVOTHROID  Take 137 mcg by mouth daily.     lisinopril 40 MG tablet  Commonly known as:  PRINIVIL,ZESTRIL  Take 40 mg by mouth daily.     metoprolol tartrate 25 MG tablet  Commonly known as:  LOPRESSOR  Take 12.5 mg by mouth 2 (two) times daily.     nitroGLYCERIN 0.4 MG SL tablet  Commonly known as:  NITROSTAT  Place 0.4 mg under the tongue every 5 (five) minutes as needed. Chest pain     promethazine 25 MG tablet  Commonly known as:  PHENERGAN  Take 12.5-25 mg by mouth every 6 (six) hours as needed. Nausea,vomiting     traMADol 50 MG tablet  Commonly known as:  ULTRAM  Take 50 mg by mouth every 6 (six) hours as  needed. For pain        Discharge Orders   Future Appointments Provider Department Dept Phone   02/20/2013 1:40 PM Prescott Parma, PA-C [facility] (near [place]) [phone]   Future Orders Complete By Expires     Diet - low sodium heart healthy  As directed     Increase activity slowly  As directed       Follow-up Information   Follow up with SERPE, EUGENE, PA-C On 02/20/2013. (See for Dr. Diona Browner  at 1:40 PM)    Contact information:   [address], [address] [place] [address] [phone]       BRING ALL MEDICATIONS WITH YOU TO FOLLOW UP APPOINTMENTS  Time spent with patient to include physician time: 33 min Signed: Theodore Demark, PA-C 01/31/2013, 1:12 PM Co-Sign MD

## 2013-01-31 NOTE — Progress Notes (Signed)
Subjective: Patient continues to have same intermittent substernal CP  None now.  No SOB Does complain of a generalized headache throughout head.   Objective: Filed Vitals:   01/31/13 0400 01/31/13 0500 01/31/13 0600 01/31/13 0756  BP: 118/50  134/55 143/58  Pulse:    56  Temp:    98.8 F (37.1 C)  TempSrc:    Oral  Resp: 15 17 15 18   Height:      Weight:      SpO2: 94% 89% 100% 94%   Weight change:   Intake/Output Summary (Last 24 hours) at 01/31/13 1610 Last data filed at 01/31/13 0700  Gross per 24 hour  Intake 1461.67 ml  Output      0 ml  Net 1461.67 ml    General: Alert, awake, oriented x3, in no acute distress Neck:  JVP is normal Heart: Regular rate and rhythm, without murmurs, rubs, gallops.  Lungs: Clear to auscultation.  No rales or wheezes. Exemities:  No edema.   Neuro: Grossly intact, nonfocal.  Tele:  SR  Lab Results: Results for orders placed during the hospital encounter of 01/30/13 (from the past 24 hour(s))  CBC WITH DIFFERENTIAL     Status: Abnormal   Collection Time    01/30/13 11:38 AM      Result Value Range   WBC 5.1  4.0 - 10.5 K/uL   RBC 4.36  3.87 - 5.11 MIL/uL   Hemoglobin 12.2  12.0 - 15.0 g/dL   HCT 96.0  45.4 - 09.8 %   MCV 88.1  78.0 - 100.0 fL   MCH 28.0  26.0 - 34.0 pg   MCHC 31.8  30.0 - 36.0 g/dL   RDW 11.9 (*) 14.7 - 82.9 %   Platelets 229  150 - 400 K/uL   Neutrophils Relative % 57  43 - 77 %   Neutro Abs 2.9  1.7 - 7.7 K/uL   Lymphocytes Relative 30  12 - 46 %   Lymphs Abs 1.5  0.7 - 4.0 K/uL   Monocytes Relative 8  3 - 12 %   Monocytes Absolute 0.4  0.1 - 1.0 K/uL   Eosinophils Relative 5  0 - 5 %   Eosinophils Absolute 0.2  0.0 - 0.7 K/uL   Basophils Relative 1  0 - 1 %   Basophils Absolute 0.0  0.0 - 0.1 K/uL  COMPREHENSIVE METABOLIC PANEL     Status: Abnormal   Collection Time    01/30/13 11:38 AM      Result Value Range   Sodium 138  135 - 145 mEq/L   Potassium 5.0  3.5 - 5.1 mEq/L   Chloride 102  96 - 112  mEq/L   CO2 29  19 - 32 mEq/L   Glucose, Bld 98  70 - 99 mg/dL   BUN 24 (*) 6 - 23 mg/dL   Creatinine, Ser 5.62  0.50 - 1.10 mg/dL   Calcium 9.0  8.4 - 13.0 mg/dL   Total Protein 6.2  6.0 - 8.3 g/dL   Albumin 3.3 (*) 3.5 - 5.2 g/dL   AST 21  0 - 37 U/L   ALT 18  0 - 35 U/L   Alkaline Phosphatase 72  39 - 117 U/L   Total Bilirubin 0.2 (*) 0.3 - 1.2 mg/dL   GFR calc non Af Amer 50 (*) >90 mL/min   GFR calc Af Amer 58 (*) >90 mL/min  TROPONIN I     Status: None  Collection Time    01/30/13 11:38 AM      Result Value Range   Troponin I <0.30  <0.30 ng/mL  URINALYSIS, ROUTINE W REFLEX MICROSCOPIC     Status: None   Collection Time    01/30/13  1:25 PM      Result Value Range   Color, Urine YELLOW  YELLOW   APPearance CLEAR  CLEAR   Specific Gravity, Urine 1.015  1.005 - 1.030   pH 5.5  5.0 - 8.0   Glucose, UA NEGATIVE  NEGATIVE mg/dL   Hgb urine dipstick NEGATIVE  NEGATIVE   Bilirubin Urine NEGATIVE  NEGATIVE   Ketones, ur NEGATIVE  NEGATIVE mg/dL   Protein, ur NEGATIVE  NEGATIVE mg/dL   Urobilinogen, UA 0.2  0.0 - 1.0 mg/dL   Nitrite NEGATIVE  NEGATIVE   Leukocytes, UA NEGATIVE  NEGATIVE  MRSA PCR SCREENING     Status: None   Collection Time    01/30/13  4:40 PM      Result Value Range   MRSA by PCR NEGATIVE  NEGATIVE  TROPONIN I     Status: None   Collection Time    01/30/13  4:44 PM      Result Value Range   Troponin I <0.30  <0.30 ng/mL  TSH     Status: Abnormal   Collection Time    01/30/13  4:44 PM      Result Value Range   TSH 6.834 (*) 0.350 - 4.500 uIU/mL  PROTIME-INR     Status: None   Collection Time    01/30/13  4:44 PM      Result Value Range   Prothrombin Time 13.4  11.6 - 15.2 seconds   INR 1.03  0.00 - 1.49  MAGNESIUM     Status: None   Collection Time    01/30/13  4:44 PM      Result Value Range   Magnesium 2.1  1.5 - 2.5 mg/dL  TROPONIN I     Status: None   Collection Time    01/30/13 10:20 PM      Result Value Range   Troponin I <0.30   <0.30 ng/mL  TROPONIN I     Status: None   Collection Time    01/31/13  4:50 AM      Result Value Range   Troponin I <0.30  <0.30 ng/mL  LIPID PANEL     Status: Abnormal   Collection Time    01/31/13  4:50 AM      Result Value Range   Cholesterol 204 (*) 0 - 200 mg/dL   Triglycerides 161 (*) <150 mg/dL   HDL 51  >09 mg/dL   Total CHOL/HDL Ratio 4.0     VLDL 30  0 - 40 mg/dL   LDL Cholesterol 604 (*) 0 - 99 mg/dL  BASIC METABOLIC PANEL     Status: Abnormal   Collection Time    01/31/13  4:50 AM      Result Value Range   Sodium 143  135 - 145 mEq/L   Potassium 4.4  3.5 - 5.1 mEq/L   Chloride 108  96 - 112 mEq/L   CO2 28  19 - 32 mEq/L   Glucose, Bld 86  70 - 99 mg/dL   BUN 19  6 - 23 mg/dL   Creatinine, Ser 5.40  0.50 - 1.10 mg/dL   Calcium 8.4  8.4 - 98.1 mg/dL   GFR calc non Af Amer 55 (*) >  90 mL/min   GFR calc Af Amer 63 (*) >90 mL/min    Studies/Results: @RISRSLT24 @  Medications:Reviewed  @PROBHOSP @  1.  CP  Continues to have intermitt pain at rest.  History overall is not sugg that it is cardiac in origin  Suspect more GI.  (One of worst spells which woke her from sleep originated in low abdomen, went up to chest and was accomp by sweats).   Patient has r/o for MI  I think it is OK to go home today  Should get appt soon in GI for evaluation  Given previous history I would keep on same regimen as she is on now.  2.  HL  Patient needs statin Rx as she has CAD by cath in 2010.   Lipitor 20 has been started  Will need f/u  Patient will need f/u in cardiology.  Prompt GI evaluatin.  LOS: 1 day   Dietrich Pates 01/31/2013, 8:12 AM

## 2013-02-20 ENCOUNTER — Ambulatory Visit (INDEPENDENT_AMBULATORY_CARE_PROVIDER_SITE_OTHER): Payer: PRIVATE HEALTH INSURANCE | Admitting: Physician Assistant

## 2013-02-20 ENCOUNTER — Encounter: Payer: Self-pay | Admitting: Physician Assistant

## 2013-02-20 VITALS — BP 135/79 | HR 62 | Ht 64.0 in | Wt 131.4 lb

## 2013-02-20 DIAGNOSIS — I10 Essential (primary) hypertension: Secondary | ICD-10-CM

## 2013-02-20 DIAGNOSIS — I2510 Atherosclerotic heart disease of native coronary artery without angina pectoris: Secondary | ICD-10-CM

## 2013-02-20 DIAGNOSIS — E782 Mixed hyperlipidemia: Secondary | ICD-10-CM

## 2013-02-20 MED ORDER — NITROGLYCERIN 0.4 MG SL SUBL: 0.4000 mg | SUBLINGUAL_TABLET | SUBLINGUAL | Status: DC | PRN
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
## 2013-02-20 NOTE — Assessment & Plan Note (Signed)
No further cardiac workup indicated. Patient's recent symptoms were ascribed to GI etiology, following improvement with GI cocktail. Troponins NL. Moreover, she presents with no recent history of exertional CP Will renew prescription for NTG and otherwise plan on having her return in one year, or sooner if needed.

## 2013-02-20 NOTE — Patient Instructions (Signed)
   Nitroglycerin as needed for severe chest pain only - refill sent to local pharmacy Continue all other current medications. Your physician wants you to follow up in:  1 year.  You will receive a reminder letter in the mail one-two months in advance.  If you don't receive a letter, please call our office to schedule the follow up appointment

## 2013-02-20 NOTE — Assessment & Plan Note (Signed)
Stable on current medication regimen 

## 2013-02-20 NOTE — Assessment & Plan Note (Signed)
Patient refused to go fill her recent prescription for Lipitor, following an LDL of 123, citing prior history of statin intolerance.

## 2013-02-25 ENCOUNTER — Ambulatory Visit (HOSPITAL_COMMUNITY)
Admission: RE | Admit: 2013-02-25 | Discharge: 2013-02-25 | Disposition: A | Discharge status: Home / Self Care | Payer: PRIVATE HEALTH INSURANCE | Source: Ambulatory Visit | Attending: Gastroenterology | Admitting: Gastroenterology

## 2013-02-25 ENCOUNTER — Encounter: Payer: Self-pay | Admitting: Gastroenterology

## 2013-02-25 ENCOUNTER — Ambulatory Visit (INDEPENDENT_AMBULATORY_CARE_PROVIDER_SITE_OTHER): Payer: PRIVATE HEALTH INSURANCE | Admitting: Gastroenterology

## 2013-02-25 VITALS — BP 113/68 | HR 59 | Temp 98.0°F | Ht 64.0 in | Wt 128.2 lb

## 2013-02-25 DIAGNOSIS — R109 Unspecified abdominal pain: Secondary | ICD-10-CM

## 2013-02-25 DIAGNOSIS — R933 Abnormal findings on diagnostic imaging of other parts of digestive tract: Secondary | ICD-10-CM | POA: Insufficient documentation

## 2013-02-25 NOTE — Progress Notes (Signed)
Quick Note:  Abd xray showed mild small bowel ileus or partial obstruction. I favor ileus, as clinically she seemed to fit this scenario.  Discussed results with patient. Clear liquids for now To ED if no improvement. ______
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
## 2013-02-25 NOTE — Patient Instructions (Addendum)
Please have the xray done when you leave. We will call with the results.  Follow liquid diet for now. Tomorrow, if you are feeling better, start the low-residue diet.   We will be in touch regarding future appointments at [facility].

## 2013-02-27 ENCOUNTER — Telehealth: Payer: Self-pay | Admitting: Gastroenterology

## 2013-02-27 DIAGNOSIS — R109 Unspecified abdominal pain: Secondary | ICD-10-CM | POA: Insufficient documentation

## 2013-02-27 NOTE — Progress Notes (Signed)
Cc PCP 

## 2013-02-27 NOTE — Assessment & Plan Note (Addendum)
79 year old female presenting with what appears to be resolving/improved small bowel obstruction/ileus. She has had a fairly recent colonoscopy, due again in 2017. CT enterography on file, with a question of Crohn's raised. Remote capsule study in 2007 with ulcers; however, at this time, hesitant to call this Crohn's disease (see result note from CT enterography). Clinically, she appears stable and without need for hospital admission. She has +BS, +flatus, and only slight distension of abdomen but soft. She is tolerating liquids. I would like to obtain a KUB due to her history. Anticipate referral to [facility] for further recommendations; likely her recurrent issues are secondary to adhesive disease. May need to consider repeat capsule study if concern for Crohn's disease remains; however, patency capsule would need to be performed first. At this point, hold off on capsule study due to improvement in CT findings. I discussed diet with patient and overall care as an outpatient; she is to present to the ED if worsening of symptoms. KUB stat now.   Also of note, TSH elevated; needs adjustment in medication. Possibly a contributing factor.

## 2013-02-27 NOTE — Telephone Encounter (Signed)
Can we get a progress report on patient? Had resolving ileus on Monday.    Also, need to refer to [facility] due to recurrent small bowel obstructions, adhesive disease.

## 2013-03-04 NOTE — Telephone Encounter (Signed)
Tried to call pt- NA 

## 2013-03-05 ENCOUNTER — Other Ambulatory Visit: Payer: Self-pay | Admitting: Gastroenterology

## 2013-03-05 DIAGNOSIS — K56609 Unspecified intestinal obstruction, unspecified as to partial versus complete obstruction: Secondary | ICD-10-CM

## 2013-03-05 NOTE — Telephone Encounter (Signed)
Noted! Thank you

## 2013-03-05 NOTE — Telephone Encounter (Signed)
Patient is scheduled at [facility] on September 9th at 12:15 with Dr. Lanell Matar

## 2013-03-06 NOTE — Telephone Encounter (Signed)
Mrs. Yearick is aware

## 2013-03-06 NOTE — Telephone Encounter (Signed)
Spoke with pt- she is doing ok, she is tired all the time and if she doesn't carefully watch her diet she will have swelling and abd pain for several hours before it resolves. She is taking the miralax and about every 2 days she has to take 2 dulcolax to keep from getting constipated.   Gave her appointment information at [facility]. She wants to know if there is anyway Benedetto Goad can get her in there sooner or at least see if they will put her on a cancellation list and call her if anyone cancels their appointment. Benedetto Goad, can you please check on this and let pt know. Thanks.

## 2013-03-06 NOTE — Telephone Encounter (Signed)
TUES July 22ND AT 10:00 WITH DR Boyd Kerbs

## 2013-04-02 DIAGNOSIS — K565 Intestinal adhesions [bands], unspecified as to partial versus complete obstruction: Secondary | ICD-10-CM | POA: Insufficient documentation

## 2013-07-02 ENCOUNTER — Emergency Department (HOSPITAL_COMMUNITY): Payer: PRIVATE HEALTH INSURANCE

## 2013-07-02 ENCOUNTER — Emergency Department (HOSPITAL_COMMUNITY)
Admission: EM | Admit: 2013-07-02 | Discharge: 2013-07-02 | Disposition: A | Discharge status: Home / Self Care | Payer: PRIVATE HEALTH INSURANCE | Attending: Emergency Medicine | Admitting: Emergency Medicine

## 2013-07-02 ENCOUNTER — Encounter (HOSPITAL_COMMUNITY): Payer: Self-pay | Admitting: Emergency Medicine

## 2013-07-02 DIAGNOSIS — Z87828 Personal history of other (healed) physical injury and trauma: Secondary | ICD-10-CM | POA: Insufficient documentation

## 2013-07-02 DIAGNOSIS — I10 Essential (primary) hypertension: Secondary | ICD-10-CM | POA: Insufficient documentation

## 2013-07-02 DIAGNOSIS — F411 Generalized anxiety disorder: Secondary | ICD-10-CM | POA: Insufficient documentation

## 2013-07-02 DIAGNOSIS — D509 Iron deficiency anemia, unspecified: Secondary | ICD-10-CM | POA: Diagnosis not present

## 2013-07-02 DIAGNOSIS — Z79899 Other long term (current) drug therapy: Secondary | ICD-10-CM | POA: Diagnosis not present

## 2013-07-02 DIAGNOSIS — K219 Gastro-esophageal reflux disease without esophagitis: Secondary | ICD-10-CM | POA: Insufficient documentation

## 2013-07-02 DIAGNOSIS — M129 Arthropathy, unspecified: Secondary | ICD-10-CM | POA: Diagnosis not present

## 2013-07-02 DIAGNOSIS — R079 Chest pain, unspecified: Secondary | ICD-10-CM | POA: Diagnosis present

## 2013-07-02 DIAGNOSIS — R55 Syncope and collapse: Secondary | ICD-10-CM | POA: Insufficient documentation

## 2013-07-02 DIAGNOSIS — R0602 Shortness of breath: Secondary | ICD-10-CM | POA: Insufficient documentation

## 2013-07-02 DIAGNOSIS — R42 Dizziness and giddiness: Secondary | ICD-10-CM | POA: Insufficient documentation

## 2013-07-02 DIAGNOSIS — E039 Hypothyroidism, unspecified: Secondary | ICD-10-CM | POA: Insufficient documentation

## 2013-07-02 DIAGNOSIS — Z7982 Long term (current) use of aspirin: Secondary | ICD-10-CM | POA: Diagnosis not present

## 2013-07-02 DIAGNOSIS — I252 Old myocardial infarction: Secondary | ICD-10-CM | POA: Insufficient documentation

## 2013-07-02 DIAGNOSIS — I2510 Atherosclerotic heart disease of native coronary artery without angina pectoris: Secondary | ICD-10-CM | POA: Diagnosis not present

## 2013-07-02 LAB — BASIC METABOLIC PANEL
CO2: 30 mEq/L (ref 19–32)
Calcium: 9.1 mg/dL (ref 8.4–10.5)
Chloride: 97 mEq/L (ref 96–112)
Creatinine, Ser: 1.32 mg/dL — ABNORMAL HIGH (ref 0.50–1.10)
Glucose, Bld: 148 mg/dL — ABNORMAL HIGH (ref 70–99)
Potassium: 4 mEq/L (ref 3.5–5.1)
Sodium: 134 mEq/L — ABNORMAL LOW (ref 135–145)

## 2013-07-02 LAB — CBC WITH DIFFERENTIAL/PLATELET
Basophils Absolute: 0 10*3/uL (ref 0.0–0.1)
Eosinophils Relative: 2 % (ref 0–5)
Hemoglobin: 11.7 g/dL — ABNORMAL LOW (ref 12.0–15.0)
Lymphocytes Relative: 10 % — ABNORMAL LOW (ref 12–46)
Lymphs Abs: 0.7 10*3/uL (ref 0.7–4.0)
MCV: 92.2 fL (ref 78.0–100.0)
Neutro Abs: 5.6 10*3/uL (ref 1.7–7.7)
Platelets: 214 10*3/uL (ref 150–400)
RBC: 3.96 MIL/uL (ref 3.87–5.11)
RDW: 14.4 % (ref 11.5–15.5)
WBC: 6.9 10*3/uL (ref 4.0–10.5)

## 2013-07-02 LAB — TROPONIN I
Troponin I: <0.3 ng/mL (ref <0.30)
Troponin I: <0.3 ng/mL (ref <0.30)

## 2013-07-02 MED ORDER — ONDANSETRON HCL 4 MG/2ML IJ SOLN
4.0000 mg | Freq: Once | INTRAMUSCULAR | Status: AC
Start: 2013-07-02 — End: 2013-07-02
  Administered 2013-07-02: 4 mg via INTRAVENOUS
  Filled 2013-07-02: qty 2
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
## 2013-07-02 NOTE — Discharge Instructions (Signed)
Dizziness Dizziness is a common problem. It is a feeling of unsteadiness or lightheadedness. You may feel like you are about to faint. Dizziness can lead to injury if you stumble or fall. A person of any age group can suffer from dizziness, but dizziness is more common in older adults. CAUSES  Dizziness can be caused by many different things, including:  Middle ear problems.  Standing for too long.  Infections.  An allergic reaction.  Aging.  An emotional response to something, such as the sight of blood.  Side effects of medicines.  Fatigue.  Problems with circulation or blood pressure.  Excess use of alcohol, medicines, or illegal drug use.  Breathing too fast (hyperventilation).  An arrhythmia or problems with your heart rhythm.  Low red blood cell count (anemia).  Pregnancy.  Vomiting, diarrhea, fever, or other illnesses that cause dehydration.  Diseases or conditions such as Parkinson's disease, high blood pressure (hypertension), diabetes, and thyroid problems.  Exposure to extreme heat. DIAGNOSIS  To find the cause of your dizziness, your caregiver may do a physical exam, lab tests, radiologic imaging scans, or an electrocardiography test (ECG).  TREATMENT  Treatment of dizziness depends on the cause of your symptoms and can vary greatly. HOME CARE INSTRUCTIONS   Drink enough fluids to keep your urine clear or pale yellow. This is especially important in very hot weather. In the elderly, it is also important in cold weather.  If your dizziness is caused by medicines, take them exactly as directed. When taking blood pressure medicines, it is especially important to get up slowly.  Rise slowly from chairs and steady yourself until you feel okay.  In the morning, first sit up on the side of the bed. When this seems okay, stand slowly while holding onto something until you know your balance is fine.  If you need to stand in one place for a long time, be sure to  move your legs often. Tighten and relax the muscles in your legs while standing.  If dizziness continues to be a problem, have someone stay with you for a day or two. Do this until you feel you are well enough to stay alone. Have the person call your caregiver if he or she notices changes in you that are concerning.  Do not drive or use heavy machinery if you feel dizzy.  Do not drink alcohol. SEEK IMMEDIATE MEDICAL CARE IF:   Your dizziness or lightheadedness gets worse.  You feel nauseous or vomit.  You develop problems with talking, walking, weakness, or using your arms, hands, or legs.  You are not thinking clearly or you have difficulty forming sentences. It may take a friend or family member to determine if your thinking is normal.  You develop chest pain, abdominal pain, shortness of breath, or sweating.  Your vision changes.  You notice any bleeding.  You have side effects from medicine that seems to be getting worse rather than better. MAKE SURE YOU:   Understand these instructions.  Will watch your condition.  Will get help right away if you are not doing well or get worse. Document Released: 02/22/2001 Document Revised: 11/21/2011 Document Reviewed: 03/18/2011 ExitCare Patient Information 2014 [employer]  Chest Pain (Nonspecific) It is often hard to give a specific diagnosis for the cause of chest pain. There is always a chance that your pain could be related to something serious, such as a heart attack or a blood clot in the lungs. You need to follow up   with your caregiver for further evaluation. CAUSES   Heartburn.  Pneumonia or bronchitis.  Anxiety or stress.  Inflammation around your heart (pericarditis) or lung (pleuritis or pleurisy).  A blood clot in the lung.  A collapsed lung (pneumothorax). It can develop suddenly on its own (spontaneous pneumothorax) or from injury (trauma) to the chest.  Shingles infection (herpes zoster virus). The  chest wall is composed of bones, muscles, and cartilage. Any of these can be the source of the pain.  The bones can be bruised by injury.  The muscles or cartilage can be strained by coughing or overwork.  The cartilage can be affected by inflammation and become sore (costochondritis). DIAGNOSIS  Lab tests or other studies, such as X-rays, electrocardiography, stress testing, or cardiac imaging, may be needed to find the cause of your pain.  TREATMENT   Treatment depends on what may be causing your chest pain. Treatment may include:  Acid blockers for heartburn.  Anti-inflammatory medicine.  Pain medicine for inflammatory conditions.  Antibiotics if an infection is present.  You may be advised to change lifestyle habits. This includes stopping smoking and avoiding alcohol, caffeine, and chocolate.  You may be advised to keep your head raised (elevated) when sleeping. This reduces the chance of acid going backward from your stomach into your esophagus.  Most of the time, nonspecific chest pain will improve within 2 to 3 days with rest and mild pain medicine. HOME CARE INSTRUCTIONS   If antibiotics were prescribed, take your antibiotics as directed. Finish them even if you start to feel better.  For the next few days, avoid physical activities that bring on chest pain. Continue physical activities as directed.  Do not smoke.  Avoid drinking alcohol.  Only take over-the-counter or prescription medicine for pain, discomfort, or fever as directed by your caregiver.  Follow your caregiver's suggestions for further testing if your chest pain does not go away.  Keep any follow-up appointments you made. If you do not go to an appointment, you could develop lasting (chronic) problems with pain. If there is any problem keeping an appointment, you must call to reschedule. SEEK MEDICAL CARE IF:   You think you are having problems from the medicine you are taking. Read your medicine  instructions carefully.  Your chest pain does not go away, even after treatment.  You develop a rash with blisters on your chest. SEEK IMMEDIATE MEDICAL CARE IF:   You have increased chest pain or pain that spreads to your arm, neck, jaw, back, or abdomen.  You develop shortness of breath, an increasing cough, or you are coughing up blood.  You have severe back or abdominal pain, feel nauseous, or vomit.  You develop severe weakness, fainting, or chills.  You have a fever. THIS IS AN EMERGENCY. Do not wait to see if the pain will go away. Get medical help at once. Call your local emergency services (911 in [place] Do not drive yourself to the hospital. MAKE SURE YOU:   Understand these instructions.  Will watch your condition.  Will get help right away if you are not doing well or get worse. Document Released: 06/08/2005 Document Revised: 11/21/2011 Document Reviewed: 04/03/2008 ExitCare Patient Information 2014 [employer].  

## 2013-07-02 NOTE — ED Notes (Signed)
Patient states the pain today made her dizzy and she felt as if she would pass out when it started. Patient also reports increased shortness of breath over last week.
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
## 2013-10-31 ENCOUNTER — Telehealth: Payer: Self-pay | Admitting: *Deleted

## 2013-10-31 NOTE — Telephone Encounter (Signed)
We haven't seen patient since June of last year. As long as symptoms are improving, can monitor as outpatient and follow clear liquids for 24 hours, then advance to soft and as tolerated. Monitor for any worsening of abdominal distension, recurrent N/V, severe abdominal pain.  Needs routine f/u appt with [place]. Seek emergency medical attention if worsening of symptoms due to hx.

## 2013-10-31 NOTE — Telephone Encounter (Signed)
Spoke with pt- she started  Saturday night with abd pain, N/V, swollen abd and a "rolling" feeling in her stomach. She vomited several times and stayed on just fluids and Pedialyte  for 2-3 days. She is still sore but its not as bad as Sat. She has a soft bm 1-2x a day (she said it looks like ground meat) and has seen some blood with wiping. No fever. She is taking the miralax every day and occasionally dulcolax as needed. She said it feels like it did before- last June,but she has vomited so she doesn't want to go to the hospital and she normally doesn't have to go to the hospital if she vomits. She just wants to know if there is anything else she can do.

## 2013-10-31 NOTE — Telephone Encounter (Signed)
Pt called saying she is having abd pain Saturday pt was throwing up, pt went to [facility] and seen Dr. Lillia Abed he recommended for her to have another colonoscopy pt drinked a glass of almond milk it made her worse, pt has not eaten pt said she had 1 piece of dried toast Sunday and she has been slipping on water ever since, pt said her stomach swells, pt said she has been going to the bathroom 3x a night, pt said she weighed yesterday her weight was 125 she weighed again this am and it was 130, pt said she had pedilite last night to see if it would help her. Pt wants to know what she should do. Please advise [phone]

## 2013-11-01 NOTE — Telephone Encounter (Signed)
Pt is aware. Manuela Schwartz, please schedule.

## 2013-11-04 ENCOUNTER — Encounter: Payer: Self-pay | Admitting: Gastroenterology

## 2013-11-04 NOTE — Telephone Encounter (Signed)
Pt is aware of OV on 3/11 at 1030 with [facility] and appt card was mailed

## 2013-11-20 ENCOUNTER — Ambulatory Visit: Payer: Medicaid Other | Admitting: Gastroenterology

## 2013-11-22 ENCOUNTER — Ambulatory Visit (INDEPENDENT_AMBULATORY_CARE_PROVIDER_SITE_OTHER): Payer: PRIVATE HEALTH INSURANCE | Admitting: Gastroenterology

## 2013-11-22 ENCOUNTER — Encounter: Payer: Self-pay | Admitting: Gastroenterology

## 2013-11-22 ENCOUNTER — Encounter (INDEPENDENT_AMBULATORY_CARE_PROVIDER_SITE_OTHER): Payer: Self-pay

## 2013-11-22 VITALS — BP 142/78 | HR 67 | Temp 96.1°F | Ht 64.0 in | Wt 126.4 lb

## 2013-11-22 DIAGNOSIS — K5900 Constipation, unspecified: Secondary | ICD-10-CM

## 2013-11-22 NOTE — Assessment & Plan Note (Addendum)
Doing well with addition of probiotic daily. Only 1 episode of questionable ileus/bowel obstruction, managed at home; she did not contact our office. Felt to be secondary to adhesions. Doubt dealing with Crohn's disease, which has been questioned in the past. Next TCS due in 2017. I have asked her to call me the minute she has recurrent symptoms, and we will obtain an AAS. Otherwise, return in 3 months.   As of note, patient refusing patency capsule/Givens, which was recommended if persistent issues.
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
## 2013-11-22 NOTE — Patient Instructions (Signed)
Continue to take a probiotic daily. We have provided samples of Align. Other options are Digestive Advantage, [employer], [employer] brand.  The next time you have an episode, call us right away and we will order an outpatient xray.   Otherwise, we will see you back in 3 months!

## 2013-11-25 NOTE — Progress Notes (Signed)
cc'd to pcp 

## 2013-12-16 ENCOUNTER — Encounter: Payer: Self-pay | Admitting: Internal Medicine

## 2013-12-24 ENCOUNTER — Encounter: Payer: Self-pay | Admitting: *Deleted

## 2014-01-01 ENCOUNTER — Other Ambulatory Visit (HOSPITAL_COMMUNITY): Payer: Self-pay | Admitting: Orthopedic Surgery

## 2014-01-01 DIAGNOSIS — M25512 Pain in left shoulder: Secondary | ICD-10-CM

## 2014-01-07 ENCOUNTER — Ambulatory Visit (HOSPITAL_COMMUNITY)
Admission: RE | Admit: 2014-01-07 | Discharge: 2014-01-07 | Disposition: A | Discharge status: Home / Self Care | Payer: PRIVATE HEALTH INSURANCE | Source: Ambulatory Visit | Attending: Orthopedic Surgery | Admitting: Orthopedic Surgery

## 2014-01-07 DIAGNOSIS — X58XXXA Exposure to other specified factors, initial encounter: Secondary | ICD-10-CM | POA: Insufficient documentation

## 2014-01-07 DIAGNOSIS — M6789 Other specified disorders of synovium and tendon, multiple sites: Secondary | ICD-10-CM | POA: Insufficient documentation

## 2014-01-07 DIAGNOSIS — M25512 Pain in left shoulder: Secondary | ICD-10-CM

## 2014-01-07 DIAGNOSIS — S43499A Other sprain of unspecified shoulder joint, initial encounter: Secondary | ICD-10-CM | POA: Insufficient documentation

## 2014-01-07 DIAGNOSIS — S46819A Strain of other muscles, fascia and tendons at shoulder and upper arm level, unspecified arm, initial encounter: Secondary | ICD-10-CM | POA: Insufficient documentation

## 2014-01-07 DIAGNOSIS — M25519 Pain in unspecified shoulder: Secondary | ICD-10-CM | POA: Insufficient documentation

## 2014-01-09 ENCOUNTER — Encounter: Payer: Self-pay | Admitting: Cardiology

## 2014-01-10 ENCOUNTER — Encounter: Payer: Self-pay | Admitting: Cardiology

## 2014-01-10 ENCOUNTER — Ambulatory Visit (INDEPENDENT_AMBULATORY_CARE_PROVIDER_SITE_OTHER): Payer: PRIVATE HEALTH INSURANCE | Admitting: Cardiology

## 2014-01-10 VITALS — BP 125/70 | HR 47 | Ht 64.0 in | Wt 126.0 lb

## 2014-01-10 DIAGNOSIS — R001 Bradycardia, unspecified: Secondary | ICD-10-CM

## 2014-01-10 DIAGNOSIS — I10 Essential (primary) hypertension: Secondary | ICD-10-CM

## 2014-01-10 DIAGNOSIS — I498 Other specified cardiac arrhythmias: Secondary | ICD-10-CM

## 2014-01-10 DIAGNOSIS — Z01810 Encounter for preprocedural cardiovascular examination: Secondary | ICD-10-CM | POA: Insufficient documentation

## 2014-01-10 DIAGNOSIS — I2510 Atherosclerotic heart disease of native coronary artery without angina pectoris: Secondary | ICD-10-CM

## 2014-01-10 NOTE — Assessment & Plan Note (Signed)
Asymptomatic, on beta blocker. No changes made today.

## 2014-01-10 NOTE — Assessment & Plan Note (Signed)
Blood pressure well-controlled today. 

## 2014-01-10 NOTE — Assessment & Plan Note (Signed)
History of PTCA of nondominant RCA in 2010. Cardiolite negative for ischemia in 2013. ECG stable.

## 2014-01-10 NOTE — Patient Instructions (Signed)
Continue all current medications. Your physician wants you to follow up in: 6 months.  You will receive a reminder letter in the mail one-two months in advance.  If you don't receive a letter, please call our office to schedule the follow up appointment   
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
## 2014-01-10 NOTE — Assessment & Plan Note (Signed)
The patient is being considered for elective left shoulder arthroscopic surgery in the near future. She has been stable from a cardiac perspective, no active angina or CHF symptoms, no recent cardiac hospitalizations. She had a low risk Cardiolite within the last 2 years, ECG reviewed and stable as well. She should be able to proceed at an acceptable perioperative cardiac risk. Would continue medical therapy throughout - aspirin can be held if needed.

## 2014-02-20 ENCOUNTER — Ambulatory Visit: Payer: Medicaid Other | Admitting: Gastroenterology

## 2014-03-03 ENCOUNTER — Ambulatory Visit (INDEPENDENT_AMBULATORY_CARE_PROVIDER_SITE_OTHER): Payer: PRIVATE HEALTH INSURANCE | Admitting: Gastroenterology

## 2014-03-03 ENCOUNTER — Encounter (INDEPENDENT_AMBULATORY_CARE_PROVIDER_SITE_OTHER): Payer: Self-pay

## 2014-03-03 ENCOUNTER — Encounter: Payer: Self-pay | Admitting: Gastroenterology

## 2014-03-03 VITALS — BP 123/76 | HR 58 | Temp 97.1°F | Resp 18 | Ht 64.0 in | Wt 127.0 lb

## 2014-03-03 DIAGNOSIS — D509 Iron deficiency anemia, unspecified: Secondary | ICD-10-CM

## 2014-03-03 NOTE — Patient Instructions (Signed)
We will see you back in 1 year or sooner as needed!!!  Please call us with any concerns.
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
## 2014-03-03 NOTE — Assessment & Plan Note (Signed)
Hx of IDA in past secondary to small bowel ulcers, NSAIDs. Thorough work-up in past for IBD but without findings to support this. Seen at [facility] Nov 2014. Hx of SBO secondary to adhesive disease, responding well to FODMAP diet. No upper or lower GI concerns.   Obtain most recent outside labs for our records Return in 1 year Colonoscopy 2017

## 2014-03-03 NOTE — Progress Notes (Signed)
cc'd to pcp 

## 2014-11-06 DIAGNOSIS — M2041 Other hammer toe(s) (acquired), right foot: Secondary | ICD-10-CM | POA: Diagnosis not present

## 2014-11-06 DIAGNOSIS — L110 Acquired keratosis follicularis: Secondary | ICD-10-CM | POA: Diagnosis not present

## 2014-11-06 DIAGNOSIS — M79674 Pain in right toe(s): Secondary | ICD-10-CM | POA: Diagnosis not present

## 2014-11-12 DIAGNOSIS — E780 Pure hypercholesterolemia: Secondary | ICD-10-CM | POA: Diagnosis not present

## 2014-11-12 DIAGNOSIS — E782 Mixed hyperlipidemia: Secondary | ICD-10-CM | POA: Diagnosis not present

## 2014-11-12 DIAGNOSIS — I10 Essential (primary) hypertension: Secondary | ICD-10-CM | POA: Diagnosis not present

## 2014-11-12 DIAGNOSIS — K210 Gastro-esophageal reflux disease with esophagitis: Secondary | ICD-10-CM | POA: Diagnosis not present

## 2014-11-12 DIAGNOSIS — E039 Hypothyroidism, unspecified: Secondary | ICD-10-CM | POA: Diagnosis not present

## 2014-11-19 DIAGNOSIS — I10 Essential (primary) hypertension: Secondary | ICD-10-CM | POA: Diagnosis not present

## 2014-11-19 DIAGNOSIS — G4700 Insomnia, unspecified: Secondary | ICD-10-CM | POA: Diagnosis not present

## 2014-11-19 DIAGNOSIS — E039 Hypothyroidism, unspecified: Secondary | ICD-10-CM | POA: Diagnosis not present

## 2014-11-19 DIAGNOSIS — Z1389 Encounter for screening for other disorder: Secondary | ICD-10-CM | POA: Diagnosis not present

## 2014-11-19 DIAGNOSIS — D649 Anemia, unspecified: Secondary | ICD-10-CM | POA: Diagnosis not present

## 2014-11-19 DIAGNOSIS — F411 Generalized anxiety disorder: Secondary | ICD-10-CM | POA: Diagnosis not present

## 2014-11-30 DIAGNOSIS — E039 Hypothyroidism, unspecified: Secondary | ICD-10-CM | POA: Diagnosis not present

## 2014-11-30 DIAGNOSIS — D509 Iron deficiency anemia, unspecified: Secondary | ICD-10-CM | POA: Diagnosis not present

## 2014-11-30 DIAGNOSIS — I6782 Cerebral ischemia: Secondary | ICD-10-CM | POA: Diagnosis not present

## 2014-11-30 DIAGNOSIS — R001 Bradycardia, unspecified: Secondary | ICD-10-CM | POA: Diagnosis not present

## 2014-11-30 DIAGNOSIS — R55 Syncope and collapse: Secondary | ICD-10-CM | POA: Diagnosis not present

## 2014-11-30 DIAGNOSIS — K219 Gastro-esophageal reflux disease without esophagitis: Secondary | ICD-10-CM | POA: Diagnosis not present

## 2014-11-30 DIAGNOSIS — R531 Weakness: Secondary | ICD-10-CM | POA: Diagnosis not present

## 2014-11-30 DIAGNOSIS — I10 Essential (primary) hypertension: Secondary | ICD-10-CM | POA: Diagnosis not present

## 2014-11-30 DIAGNOSIS — Z9109 Other allergy status, other than to drugs and biological substances: Secondary | ICD-10-CM | POA: Diagnosis not present

## 2014-11-30 DIAGNOSIS — R079 Chest pain, unspecified: Secondary | ICD-10-CM | POA: Diagnosis not present

## 2014-11-30 DIAGNOSIS — I252 Old myocardial infarction: Secondary | ICD-10-CM | POA: Diagnosis not present

## 2014-11-30 DIAGNOSIS — Z79891 Long term (current) use of opiate analgesic: Secondary | ICD-10-CM | POA: Diagnosis not present

## 2014-11-30 DIAGNOSIS — I213 ST elevation (STEMI) myocardial infarction of unspecified site: Secondary | ICD-10-CM | POA: Diagnosis not present

## 2014-12-01 DIAGNOSIS — R55 Syncope and collapse: Secondary | ICD-10-CM | POA: Diagnosis not present

## 2014-12-01 DIAGNOSIS — R079 Chest pain, unspecified: Secondary | ICD-10-CM | POA: Diagnosis not present

## 2014-12-10 DIAGNOSIS — D649 Anemia, unspecified: Secondary | ICD-10-CM | POA: Diagnosis not present

## 2014-12-10 DIAGNOSIS — E782 Mixed hyperlipidemia: Secondary | ICD-10-CM | POA: Diagnosis not present

## 2014-12-10 DIAGNOSIS — F411 Generalized anxiety disorder: Secondary | ICD-10-CM | POA: Diagnosis not present

## 2014-12-10 DIAGNOSIS — E039 Hypothyroidism, unspecified: Secondary | ICD-10-CM | POA: Diagnosis not present

## 2014-12-10 DIAGNOSIS — I10 Essential (primary) hypertension: Secondary | ICD-10-CM | POA: Diagnosis not present

## 2014-12-10 DIAGNOSIS — R55 Syncope and collapse: Secondary | ICD-10-CM | POA: Diagnosis not present

## 2014-12-31 DIAGNOSIS — I10 Essential (primary) hypertension: Secondary | ICD-10-CM | POA: Diagnosis not present

## 2014-12-31 DIAGNOSIS — D649 Anemia, unspecified: Secondary | ICD-10-CM | POA: Diagnosis not present

## 2014-12-31 DIAGNOSIS — R55 Syncope and collapse: Secondary | ICD-10-CM | POA: Diagnosis not present

## 2014-12-31 DIAGNOSIS — E039 Hypothyroidism, unspecified: Secondary | ICD-10-CM | POA: Diagnosis not present

## 2014-12-31 DIAGNOSIS — E782 Mixed hyperlipidemia: Secondary | ICD-10-CM | POA: Diagnosis not present

## 2015-01-16 DIAGNOSIS — E039 Hypothyroidism, unspecified: Secondary | ICD-10-CM | POA: Diagnosis not present

## 2015-01-20 DIAGNOSIS — M2041 Other hammer toe(s) (acquired), right foot: Secondary | ICD-10-CM | POA: Diagnosis not present

## 2015-01-20 DIAGNOSIS — M79674 Pain in right toe(s): Secondary | ICD-10-CM | POA: Diagnosis not present

## 2015-01-20 DIAGNOSIS — M79671 Pain in right foot: Secondary | ICD-10-CM | POA: Diagnosis not present

## 2015-01-26 ENCOUNTER — Encounter: Payer: Self-pay | Admitting: Internal Medicine

## 2015-02-03 DIAGNOSIS — M2041 Other hammer toe(s) (acquired), right foot: Secondary | ICD-10-CM | POA: Diagnosis not present

## 2015-02-19 ENCOUNTER — Encounter: Payer: Self-pay | Admitting: Podiatry

## 2015-02-19 ENCOUNTER — Ambulatory Visit (INDEPENDENT_AMBULATORY_CARE_PROVIDER_SITE_OTHER): Payer: Medicare Other | Admitting: Podiatry

## 2015-02-19 ENCOUNTER — Ambulatory Visit: Payer: Medicaid Other | Admitting: Podiatry

## 2015-02-19 VITALS — BP 165/80 | HR 56 | Resp 12

## 2015-02-19 DIAGNOSIS — M2041 Other hammer toe(s) (acquired), right foot: Secondary | ICD-10-CM

## 2015-02-19 DIAGNOSIS — M6740 Ganglion, unspecified site: Secondary | ICD-10-CM | POA: Diagnosis not present

## 2015-02-19 MED ORDER — TRIAMCINOLONE ACETONIDE 10 MG/ML IJ SUSP
10.0000 mg | Freq: Once | INTRAMUSCULAR | Status: AC
  Administered 2015-02-19: 10 mg

## 2015-02-19 NOTE — Progress Notes (Signed)
   Subjective:    Patient ID: Andrea Jackson, female    DOB: [birth year], 81 y.o.   MRN: [id number]  HPI  Patient has sore on R 3 toe, painful w/ inflammation  Review of Systems  Constitutional: Negative.   HENT: Negative.   Eyes: Negative.   Respiratory: Negative.   Skin: Positive for color change and rash.  All other systems reviewed and are negative.      Objective:   Physical Exam        Assessment & Plan:

## 2015-02-20 NOTE — Progress Notes (Signed)
Subjective:     Patient ID: Andrea Jackson, female   DOB: 11/14/1933, 81 y.o.   MRN: [id number]  HPI patient presents with inflammation and a lesion at the interphalangeal joint third toe right that becomes painful and she's tried to pad and wear wider shoe gear with. Referred by orthopedic doctor who tried to trim   Review of Systems  All other systems reviewed and are negative.      Objective:   Physical Exam  Constitutional: She is oriented to person, place, and time.  Cardiovascular: Intact distal pulses.   Musculoskeletal: Normal range of motion.  Neurological: She is oriented to person, place, and time.  Skin: Skin is warm.  Nursing note and vitals reviewed.  neurovascular status intact with muscle strength adequate range of motion within normal limits. Patient's found to have good digital perfusion is well oriented 3 and I noted that the interphalangeal joint digit 3 right on the lateral side there is a slight bit of fluid buildup with keratotic lesion that's painful when pressed     Assessment:     Possibility for interphalangeal joint inflammation and keratotic lesion and arthritis along with possible cyst formation    Plan:     H&P and x-ray reviewed. We are get a try conservative care first and I did proximal nerve block and I then went ahead and injected the interphalangeal joint with 1.5 mg dexamethasone Kenalog and Xylocaine to debride the lesion and applied padding. Reappoint when symptomatic again

## 2015-03-02 ENCOUNTER — Encounter: Payer: Self-pay | Admitting: Gastroenterology

## 2015-03-02 ENCOUNTER — Other Ambulatory Visit: Payer: Self-pay | Admitting: Gastroenterology

## 2015-03-02 ENCOUNTER — Ambulatory Visit (INDEPENDENT_AMBULATORY_CARE_PROVIDER_SITE_OTHER): Payer: Medicare Other | Admitting: Gastroenterology

## 2015-03-02 ENCOUNTER — Other Ambulatory Visit: Payer: Self-pay

## 2015-03-02 VITALS — BP 144/73 | HR 53 | Temp 97.4°F | Ht 64.0 in | Wt 128.8 lb

## 2015-03-02 DIAGNOSIS — K5909 Other constipation: Secondary | ICD-10-CM

## 2015-03-02 DIAGNOSIS — R55 Syncope and collapse: Secondary | ICD-10-CM | POA: Insufficient documentation

## 2015-03-02 DIAGNOSIS — D509 Iron deficiency anemia, unspecified: Secondary | ICD-10-CM | POA: Diagnosis not present

## 2015-03-02 DIAGNOSIS — K5900 Constipation, unspecified: Secondary | ICD-10-CM | POA: Diagnosis not present

## 2015-03-02 LAB — CBC WITH DIFFERENTIAL/PLATELET
BASOS ABS: 0 10*3/uL (ref 0.0–0.1)
Basophils Relative: 0 % (ref 0–1)
Eosinophils Absolute: 0.2 10*3/uL (ref 0.0–0.7)
Eosinophils Relative: 2 % (ref 0–5)
HCT: 41.1 % (ref 36.0–46.0)
Hemoglobin: 12.8 g/dL (ref 12.0–15.0)
LYMPHS PCT: 13 % (ref 12–46)
Lymphs Abs: 1.3 10*3/uL (ref 0.7–4.0)
MCH: 28.1 pg (ref 26.0–34.0)
MCHC: 31.1 g/dL (ref 30.0–36.0)
MCV: 90.1 fL (ref 78.0–100.0)
MONO ABS: 0.8 10*3/uL (ref 0.1–1.0)
MPV: 10.2 fL (ref 8.6–12.4)
Monocytes Relative: 8 % (ref 3–12)
NEUTROS ABS: 7.8 10*3/uL — AB (ref 1.7–7.7)
Neutrophils Relative %: 77 % (ref 43–77)
Platelets: 255 10*3/uL (ref 150–400)
RBC: 4.56 MIL/uL (ref 3.87–5.11)
RDW: 16 % — ABNORMAL HIGH (ref 11.5–15.5)
WBC: 10.1 10*3/uL (ref 4.0–10.5)

## 2015-03-02 LAB — COMPREHENSIVE METABOLIC PANEL
ALT: 16 U/L (ref 0–35)
AST: 17 U/L (ref 0–37)
Albumin: 3.7 g/dL (ref 3.5–5.2)
Alkaline Phosphatase: 55 U/L (ref 39–117)
BUN: 21 mg/dL (ref 6–23)
CALCIUM: 9.3 mg/dL (ref 8.4–10.5)
CO2: 30 meq/L (ref 19–32)
Chloride: 102 mEq/L (ref 96–112)
Creat: 1.06 mg/dL (ref 0.50–1.10)
Glucose, Bld: 91 mg/dL (ref 70–99)
Potassium: 4.9 mEq/L (ref 3.5–5.3)
SODIUM: 137 meq/L (ref 135–145)
TOTAL PROTEIN: 5.8 g/dL — AB (ref 6.0–8.3)
Total Bilirubin: 0.3 mg/dL (ref 0.2–1.2)

## 2015-03-02 LAB — TSH: TSH: 0.948 u[IU]/mL (ref 0.350–4.500)

## 2015-03-02 NOTE — Assessment & Plan Note (Signed)
Doing well with current regimen. 
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
## 2015-03-02 NOTE — Assessment & Plan Note (Signed)
Recurrent near syncopal episodes associated with lightheadedness, dizziness, diaphoresis. Denies chest pain. Reports hospitalization two months ago at [facility] with no explanation for symptoms. H/O CAD and NSTEMI in 2010. Recommend follow up with cardiology. If another episode, then to ER.  Check STAT labs for anemia, electrolytes, TSH.

## 2015-03-02 NOTE — Patient Instructions (Signed)
1. Referral back to Dr. Domenic Polite for near syncope episodes.  2. STAT labs today.  3. If you have another episode of near syncope, go to the ER. 4. I will review records from recent hospitalization for review.

## 2015-03-02 NOTE — Progress Notes (Signed)
cc'd to pcp 

## 2015-03-02 NOTE — Progress Notes (Signed)
Quick Note:  Please see if you can add on a iron/tibc, ferritin for h/o IDA.  Please let patient know that her Hgb, electrolytes, kidney function, TSH labs are unremarkable but I am waiting on iron stores/labs.  Cardiology appt as recommended. ______

## 2015-03-02 NOTE — Assessment & Plan Note (Signed)
F/u labs. H/O IDA in past with SB ulcers, NSAIDs. H/o SBO secondary to adhesive disease. Work up in past for [employer] as outlined without compelling evidence. Next TCS in 2017.

## 2015-03-03 LAB — IRON AND TIBC
%SAT: 9 % — ABNORMAL LOW (ref 20–55)
Iron: 30 ug/dL — ABNORMAL LOW (ref 42–145)
TIBC: 350 ug/dL (ref 250–470)
UIBC: 320 ug/dL (ref 125–400)

## 2015-03-03 LAB — FERRITIN: FERRITIN: 24 ng/mL (ref 10–291)

## 2015-03-05 ENCOUNTER — Other Ambulatory Visit: Payer: Self-pay | Admitting: Gastroenterology

## 2015-03-05 ENCOUNTER — Telehealth: Payer: Self-pay | Admitting: Gastroenterology

## 2015-03-05 DIAGNOSIS — D509 Iron deficiency anemia, unspecified: Secondary | ICD-10-CM

## 2015-03-05 NOTE — Telephone Encounter (Signed)
Reviewed records from hospitalization in March 2016 for syncopal episode. Thought to be related to sinus bradycardia. Labs during that admission showed hemoglobin of 8.9, hematocrit 30.3, MCV 77.9, TSH of 42.96. CT of the head without contrast with no acute changes.  Patient previously mentioned that her blood levothyroxine had been adjusted prior to this admission and also had been told to stop her iron therapy. Subsequently her dose of levothyroxine has been adjusted. She is back on iron therapy.  Current hemoglobin improved to 12.8, however iron and saturations are low, ferritin low normal.  -Please advise patient that she should follow up with cardiology regarding ongoing presyncopal episodes. May be secondary to bradycardia. Medications may need to be adjusted. -continue iron therapy. -complete ifobt (please send her one) -repeat CBC, iron/TIBC, ferritin in 12 weeks.

## 2015-03-05 NOTE — Progress Notes (Signed)
Quick Note:  See separate telephone encounter dated 03/05/15. ______

## 2015-03-05 NOTE — Telephone Encounter (Signed)
Correction, pt has upcoming ov with cardiology on 03/25/15

## 2015-03-05 NOTE — Telephone Encounter (Signed)
Pt is aware. Lab orders on file. ifobt in the mail to the pt. cc'd pcp per pt request. She said she already had an ov with cardiology.

## 2015-03-12 ENCOUNTER — Ambulatory Visit (INDEPENDENT_AMBULATORY_CARE_PROVIDER_SITE_OTHER): Payer: Medicare Other

## 2015-03-12 DIAGNOSIS — D509 Iron deficiency anemia, unspecified: Secondary | ICD-10-CM

## 2015-03-13 LAB — IFOBT (OCCULT BLOOD): IFOBT: POSITIVE

## 2015-03-13 NOTE — Progress Notes (Signed)
Pt returned one iFOBT and it was positive.  

## 2015-03-19 ENCOUNTER — Ambulatory Visit (INDEPENDENT_AMBULATORY_CARE_PROVIDER_SITE_OTHER): Payer: Medicare Other | Admitting: Podiatry

## 2015-03-19 ENCOUNTER — Encounter: Payer: Self-pay | Admitting: Podiatry

## 2015-03-19 VITALS — BP 127/63 | HR 55 | Resp 12

## 2015-03-19 DIAGNOSIS — M6740 Ganglion, unspecified site: Secondary | ICD-10-CM

## 2015-03-19 DIAGNOSIS — M2041 Other hammer toe(s) (acquired), right foot: Secondary | ICD-10-CM | POA: Diagnosis not present

## 2015-03-19 NOTE — Progress Notes (Signed)
   Subjective:    Patient ID: Andrea Jackson, female    DOB: 1933/11/24, 81 y.o.   MRN: [id number]  HPI Patient has had R 3 hammertoe treated in the past for corn removal. The spot has come back and is consistently for the patient.   Review of Systems  Constitutional: Negative.   Eyes: Positive for pain.  Skin: Positive for color change and wound.       Objective:   Physical Exam        Assessment & Plan:

## 2015-03-19 NOTE — Progress Notes (Signed)
Subjective:     Patient ID: Andrea Jackson, female   DOB: [birth year], 81 y.o.   MRN: [id number]  HPI patient presents stating this third toe is still really bothering me and I know that I'm getting need to have it fixed. We have tried previous injection therapy she has tried padding therapy she has tried wider shoes and soaks with no relief of symptoms   Review of Systems     Objective:   Physical Exam  Neurovascular status intact muscle strength adequate with inflammation and pain and keratotic lesion formation third toe right foot    Assessment:     Hammertoe deformity of the distal interphalangeal joint right third toe with bony growth and keratotic lesion formation with extreme pain and failure to respond to conservative care    Plan:     Reviewed x-rays indicating enlargement of the joint surface and discussed treatment options. Due to long-standing problem and failure to be able to wear shoe gear it's been recommended that distal arthroplasty be obtained. I explained the procedure to her and allow her to read a consent form line byline reviewing distal arthroplasty and alternative treatments and complications. She has tried alternative treatments and at this time wants surgery and signs consent form understanding total recovery. Can take 6 months and there is no long-term guarantees. She is dispensed a preoperative instructions and is scheduled for outpatient procedure

## 2015-03-20 DIAGNOSIS — E039 Hypothyroidism, unspecified: Secondary | ICD-10-CM | POA: Diagnosis not present

## 2015-03-20 DIAGNOSIS — D649 Anemia, unspecified: Secondary | ICD-10-CM | POA: Diagnosis not present

## 2015-03-20 DIAGNOSIS — I10 Essential (primary) hypertension: Secondary | ICD-10-CM | POA: Diagnosis not present

## 2015-03-20 DIAGNOSIS — E780 Pure hypercholesterolemia: Secondary | ICD-10-CM | POA: Diagnosis not present

## 2015-03-20 DIAGNOSIS — E782 Mixed hyperlipidemia: Secondary | ICD-10-CM | POA: Diagnosis not present

## 2015-03-22 NOTE — Progress Notes (Signed)
Quick Note:  Please let patient know her ifobt was positive. She should consider colonoscopy with ileoscopy +/- EGD in OR due to polypharmacy (should ask cardiology is ok to proceed - she has appt for f/u syncope on 03/25/15). Dx: ifobt + and IDA. Patient approaching 30 days from last OV so may require additional OV. Cannot be scheduled until she vollows up with cardiology on 03/25/15 and gets approval for procedure. ______

## 2015-03-23 ENCOUNTER — Ambulatory Visit: Payer: Self-pay | Admitting: Cardiology

## 2015-03-23 DIAGNOSIS — G4700 Insomnia, unspecified: Secondary | ICD-10-CM | POA: Diagnosis not present

## 2015-03-23 DIAGNOSIS — I10 Essential (primary) hypertension: Secondary | ICD-10-CM | POA: Diagnosis not present

## 2015-03-23 DIAGNOSIS — D649 Anemia, unspecified: Secondary | ICD-10-CM | POA: Diagnosis not present

## 2015-03-23 DIAGNOSIS — F411 Generalized anxiety disorder: Secondary | ICD-10-CM | POA: Diagnosis not present

## 2015-03-23 DIAGNOSIS — E039 Hypothyroidism, unspecified: Secondary | ICD-10-CM | POA: Diagnosis not present

## 2015-03-24 DIAGNOSIS — I10 Essential (primary) hypertension: Secondary | ICD-10-CM | POA: Diagnosis not present

## 2015-03-25 ENCOUNTER — Encounter: Payer: Self-pay | Admitting: *Deleted

## 2015-03-25 ENCOUNTER — Ambulatory Visit (INDEPENDENT_AMBULATORY_CARE_PROVIDER_SITE_OTHER): Payer: Medicare Other | Admitting: Cardiology

## 2015-03-25 ENCOUNTER — Encounter: Payer: Self-pay | Admitting: Cardiology

## 2015-03-25 VITALS — BP 166/76 | HR 58 | Ht 64.0 in | Wt 129.0 lb

## 2015-03-25 DIAGNOSIS — R55 Syncope and collapse: Secondary | ICD-10-CM

## 2015-03-25 DIAGNOSIS — I25118 Atherosclerotic heart disease of native coronary artery with other forms of angina pectoris: Secondary | ICD-10-CM | POA: Diagnosis not present

## 2015-03-25 DIAGNOSIS — I10 Essential (primary) hypertension: Secondary | ICD-10-CM

## 2015-03-25 DIAGNOSIS — R001 Bradycardia, unspecified: Secondary | ICD-10-CM | POA: Diagnosis not present

## 2015-03-25 DIAGNOSIS — I2510 Atherosclerotic heart disease of native coronary artery without angina pectoris: Secondary | ICD-10-CM | POA: Diagnosis not present

## 2015-03-25 DIAGNOSIS — I25119 Atherosclerotic heart disease of native coronary artery with unspecified angina pectoris: Secondary | ICD-10-CM | POA: Diagnosis not present

## 2015-03-25 NOTE — Patient Instructions (Signed)
Your physician has requested that you have a lexiscan myoview. For further information please visit [email or web address]. Please follow instruction sheet, as given. Your physician has recommended that you wear a 7 day event monitor. Event monitors are medical devices that record the heart's electrical activity. Doctors most often [place] these monitors to diagnose arrhythmias. Arrhythmias are problems with the speed or rhythm of the heartbeat. The monitor is a small, portable device. You can wear one while you do your normal daily activities. This is usually used to diagnose what is causing palpitations/syncope (passing out). Office will contact with results via phone or letter.   Stop Lopressor (Metoprolol) Continue all other medications.   Your physician wants you to follow up in: 6 months.  You will receive a reminder letter in the mail one-two months in advance.  If you don't receive a letter, please call our office to schedule the follow up appointment

## 2015-03-25 NOTE — Addendum Note (Signed)
Addended by: Laurine Blazer on: 03/25/2015 03:28 PM   Modules accepted: Orders, Medications
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
## 2015-03-30 ENCOUNTER — Ambulatory Visit (INDEPENDENT_AMBULATORY_CARE_PROVIDER_SITE_OTHER): Payer: Medicare Other

## 2015-03-30 DIAGNOSIS — R55 Syncope and collapse: Secondary | ICD-10-CM

## 2015-04-03 ENCOUNTER — Encounter (HOSPITAL_COMMUNITY)
Admission: RE | Admit: 2015-04-03 | Discharge: 2015-04-03 | Disposition: A | Discharge status: Home / Self Care | Payer: Medicare Other | Source: Ambulatory Visit | Attending: Cardiology | Admitting: Cardiology

## 2015-04-03 ENCOUNTER — Inpatient Hospital Stay (HOSPITAL_COMMUNITY): Admission: RE | Admit: 2015-04-03 | Payer: Self-pay | Source: Ambulatory Visit

## 2015-04-03 ENCOUNTER — Encounter (HOSPITAL_COMMUNITY): Payer: Self-pay

## 2015-04-03 DIAGNOSIS — I25118 Atherosclerotic heart disease of native coronary artery with other forms of angina pectoris: Secondary | ICD-10-CM | POA: Insufficient documentation

## 2015-04-03 HISTORY — DX: Systemic involvement of connective tissue, unspecified: M35.9

## 2015-04-03 LAB — NM MYOCAR MULTI W/SPECT W/WALL MOTION / EF
LHR: 0
LV dias vol: 71 mL
LV sys vol: 29 mL
NUC STRESS TID: 0.98
Peak HR: 90 {beats}/min
Rest HR: 58 {beats}/min
SDS: 1
SRS: 5
SSS: 6

## 2015-04-03 MED ORDER — REGADENOSON 0.4 MG/5ML IV SOLN
INTRAVENOUS | Status: AC
  Administered 2015-04-03: 0.4 mg
  Filled 2015-04-03: qty 5

## 2015-04-03 MED ORDER — TECHNETIUM TC 99M SESTAMIBI GENERIC - CARDIOLITE
30.0000 | Freq: Once | INTRAVENOUS | Status: AC | PRN
  Administered 2015-04-03: 32 via INTRAVENOUS

## 2015-04-03 MED ORDER — TECHNETIUM TC 99M SESTAMIBI - CARDIOLITE
10.0000 | Freq: Once | INTRAVENOUS | Status: AC | PRN
  Administered 2015-04-03: 07:00:00 9 via INTRAVENOUS

## 2015-04-08 ENCOUNTER — Telehealth: Payer: Self-pay | Admitting: *Deleted

## 2015-04-08 NOTE — Telephone Encounter (Signed)
-----   Message from Satira Sark, MD sent at 04/08/2015  7:38 AM EDT ----- See report. No significant arrhythmias documented.

## 2015-04-08 NOTE — Telephone Encounter (Signed)
Pt aware, routed to pcp 

## 2015-04-10 ENCOUNTER — Telehealth: Payer: Self-pay | Admitting: *Deleted

## 2015-04-10 ENCOUNTER — Encounter: Payer: Self-pay | Admitting: *Deleted

## 2015-04-10 DIAGNOSIS — I25118 Atherosclerotic heart disease of native coronary artery with other forms of angina pectoris: Secondary | ICD-10-CM

## 2015-04-10 NOTE — Telephone Encounter (Signed)
-----   Message from Satira Sark, MD sent at 04/04/2015  8:17 AM EDT ----- Reviewed Dr. Kyla Balzarine report. Study was not of adequate imaging quality to make assessment of ischemia. Please let her know and let's see if she can do a stress echocardiogram as another modality that might provide better information.

## 2015-04-10 NOTE — Telephone Encounter (Signed)
Notes Recorded by Laurine Blazer, LPN on 9/79/1504 at 1:36 PM Patient notified. She is in agreement to doing another test. Will put order in & forward to [facility]) for scheduling. Instructions given to not eat/drink x 4 hours prior to test. She may take all medications prior to test as she is not on any beta blockers. Result fwd to pmd.

## 2015-04-13 ENCOUNTER — Telehealth: Payer: Self-pay | Admitting: *Deleted

## 2015-04-13 NOTE — Telephone Encounter (Signed)
"  I was set up to do surgery on the 16th at the surgical center.  The surgical center has not called me and verified this.  I have a cardiologist.  I had a stress test and something didn't show up too clear.  So, they're supposed to call me from [facility] and set up a walk stress test.  Does he need to send my Cardiologist a list of what he's going to do, what medicines he's going to use and if it's okay to have that done?"  Thank you, call me back."

## 2015-04-14 ENCOUNTER — Other Ambulatory Visit: Payer: Self-pay

## 2015-04-14 DIAGNOSIS — D509 Iron deficiency anemia, unspecified: Secondary | ICD-10-CM

## 2015-04-15 NOTE — Telephone Encounter (Signed)
Patient came by the office.  "No one has called me from the surgical center regarding my surgery scheduled for 04/28/2015."  The surgical center normally calls a day or two prior to surgery date.  There's nothing else you need to do other than go online and register.  "I don't go online."   You can just call the surgical center.  "'m scheduled to see my cardiologist next week for a stress test.  Do I need to get him to send you the results of my test?"  Please get him to send [place] a letter of medical clearance.  "Okay, I will do that.  Thank you."

## 2015-04-15 NOTE — Telephone Encounter (Signed)
I attempted to call patient.  I left her a message to call me back. 

## 2015-04-16 ENCOUNTER — Telehealth: Payer: Self-pay | Admitting: Cardiology

## 2015-04-16 NOTE — Telephone Encounter (Signed)
[facility] EXOG#[id number] expires 05-31-15

## 2015-04-16 NOTE — Telephone Encounter (Signed)
Checking percert for Stress Echo scheduled for 04/20/15 at [facility]. U[employer]/Medicaid

## 2015-04-20 ENCOUNTER — Ambulatory Visit (HOSPITAL_COMMUNITY)
Admission: RE | Admit: 2015-04-20 | Discharge: 2015-04-20 | Disposition: A | Discharge status: Home / Self Care | Payer: Medicare Other | Source: Ambulatory Visit | Attending: Cardiology | Admitting: Cardiology

## 2015-04-20 ENCOUNTER — Encounter (HOSPITAL_COMMUNITY): Payer: Self-pay

## 2015-04-20 DIAGNOSIS — R079 Chest pain, unspecified: Secondary | ICD-10-CM | POA: Insufficient documentation

## 2015-04-20 DIAGNOSIS — I25118 Atherosclerotic heart disease of native coronary artery with other forms of angina pectoris: Secondary | ICD-10-CM

## 2015-04-20 LAB — ECHOCARDIOGRAM STRESS TEST
CHL CUP MPHR: 142 {beats}/min
CSEPEW: 7 METS
Exercise duration (min): 4 min
Exercise duration (sec): 55 s
Peak HR: 137 {beats}/min
Percent HR: 92 %
RPE: 11
Rest HR: 62 {beats}/min

## 2015-04-21 ENCOUNTER — Other Ambulatory Visit (HOSPITAL_COMMUNITY): Payer: Self-pay

## 2015-04-21 ENCOUNTER — Telehealth: Payer: Self-pay | Admitting: *Deleted

## 2015-04-21 NOTE — Telephone Encounter (Signed)
-----   Message from Satira Sark, MD sent at 04/20/2015  9:21 PM EDT ----- Please let her know that the stress test was normal.

## 2015-04-22 ENCOUNTER — Other Ambulatory Visit (HOSPITAL_COMMUNITY): Payer: Self-pay

## 2015-04-23 ENCOUNTER — Other Ambulatory Visit (HOSPITAL_COMMUNITY): Payer: Self-pay

## 2015-04-24 ENCOUNTER — Telehealth: Payer: Self-pay | Admitting: *Deleted

## 2015-04-24 NOTE — Telephone Encounter (Signed)
OK to proceed with local skin procedure and colonoscopy from cardiac perspective - recent stress test was normal. Stay off metoprolol for now.

## 2015-04-24 NOTE — Telephone Encounter (Signed)
Returned call

## 2015-04-24 NOTE — Telephone Encounter (Signed)
Pt having place taken off of toe with local anesthesia and also colonoscopy due to blood in stool. Will have procedure on foot 8/16. Pt also wanting clarity on whether to stay off of metoprolol. Will forward to Dr. Domenic Polite, pt aware he is not in office this week.

## 2015-04-24 NOTE — Telephone Encounter (Signed)
Pt aware.

## 2015-04-24 NOTE — Telephone Encounter (Signed)
Pt aware, routed to pcp 

## 2015-04-27 ENCOUNTER — Other Ambulatory Visit (HOSPITAL_COMMUNITY): Payer: Self-pay

## 2015-04-27 DIAGNOSIS — F329 Major depressive disorder, single episode, unspecified: Secondary | ICD-10-CM | POA: Diagnosis not present

## 2015-04-27 DIAGNOSIS — F419 Anxiety disorder, unspecified: Secondary | ICD-10-CM | POA: Diagnosis not present

## 2015-04-27 DIAGNOSIS — M1990 Unspecified osteoarthritis, unspecified site: Secondary | ICD-10-CM | POA: Diagnosis not present

## 2015-04-28 ENCOUNTER — Other Ambulatory Visit (HOSPITAL_COMMUNITY): Payer: Self-pay

## 2015-04-28 DIAGNOSIS — M2021 Hallux rigidus, right foot: Secondary | ICD-10-CM | POA: Diagnosis not present

## 2015-04-28 DIAGNOSIS — M2041 Other hammer toe(s) (acquired), right foot: Secondary | ICD-10-CM | POA: Diagnosis not present

## 2015-04-28 DIAGNOSIS — I10 Essential (primary) hypertension: Secondary | ICD-10-CM | POA: Diagnosis not present

## 2015-04-29 ENCOUNTER — Other Ambulatory Visit (HOSPITAL_COMMUNITY): Payer: Self-pay

## 2015-04-30 ENCOUNTER — Telehealth: Payer: Self-pay | Admitting: *Deleted

## 2015-04-30 NOTE — Telephone Encounter (Signed)
Courtesy call, pt states she had right much pain yesterday, but is doing better now, I encouraged pt to rest-weight bearing 5-10 min/hour, ice, elevate and take pain medication as directed.  Pt states understanding.  I encouraged pt to call with concerns and reminded her of the 05/07/2015 appt at 245pm.

## 2015-05-05 ENCOUNTER — Other Ambulatory Visit: Payer: Medicare Other

## 2015-05-07 ENCOUNTER — Ambulatory Visit (INDEPENDENT_AMBULATORY_CARE_PROVIDER_SITE_OTHER): Payer: Medicare Other | Admitting: Podiatry

## 2015-05-07 ENCOUNTER — Ambulatory Visit (INDEPENDENT_AMBULATORY_CARE_PROVIDER_SITE_OTHER): Payer: Medicare Other

## 2015-05-07 ENCOUNTER — Other Ambulatory Visit: Payer: Self-pay | Admitting: Podiatry

## 2015-05-07 VITALS — BP 129/72 | HR 53 | Resp 16

## 2015-05-07 DIAGNOSIS — Z9889 Other specified postprocedural states: Secondary | ICD-10-CM

## 2015-05-07 DIAGNOSIS — M2041 Other hammer toe(s) (acquired), right foot: Secondary | ICD-10-CM

## 2015-05-08 NOTE — Progress Notes (Signed)
She presents today for her first postop visit date of surgery 04/28/2015 status post mallet toe repair third digit right foot. States that it seems to be doing pretty well.  Objective: Presents today in her Darco shoe and a dry sterile compressive dressing was removed demonstrates vital signs are stable alert and oriented 3. No erythema edema saline as drainage or odor sutures are intact margins are well coapted. The distal aspect of the toe tends to demonstrate some slight discoloration but has a good capillary fill time more than likely venous congestion. Radiographs do demonstrate arthroplasty DIPJ third right without signs of infection.   Assessment well-healing surgical mallet toe repair third right.  Plan: discussed etiology pathology conservative versus surgical therapies. Redressed the toe dry sterile compressive dressing encouraged her to leave this on for 1 week. Encouraged her to continue to check the tip of the toe for circulation changes. She will follow up with her primary surgeon in 1 week for suture removal.

## 2015-05-12 ENCOUNTER — Encounter: Payer: Self-pay | Admitting: Podiatry

## 2015-05-12 ENCOUNTER — Ambulatory Visit (INDEPENDENT_AMBULATORY_CARE_PROVIDER_SITE_OTHER): Payer: Medicare Other

## 2015-05-12 VITALS — BP 120/65 | HR 56 | Resp 16

## 2015-05-12 DIAGNOSIS — M2041 Other hammer toe(s) (acquired), right foot: Secondary | ICD-10-CM

## 2015-05-12 NOTE — Progress Notes (Signed)
Pt presents for suture removal from hammertoe repair 3rd distal DOS 04/28/15, all sutures were removed, wound edges stayed closed and approx. She denies pain at this time. Wrapped toe with coflex for compression and swelling. Advised her that she can get foot wet but not to soak. She is to call if her status changes, any n/v fever chills, muscle aches or pain or any drainage from surgical site

## 2015-05-28 ENCOUNTER — Encounter: Payer: Self-pay | Admitting: Podiatry

## 2015-05-28 ENCOUNTER — Ambulatory Visit (INDEPENDENT_AMBULATORY_CARE_PROVIDER_SITE_OTHER): Payer: Medicare Other

## 2015-05-28 ENCOUNTER — Ambulatory Visit (INDEPENDENT_AMBULATORY_CARE_PROVIDER_SITE_OTHER): Payer: Medicare Other | Admitting: Podiatry

## 2015-05-28 VITALS — BP 132/67 | HR 62 | Resp 12

## 2015-05-28 DIAGNOSIS — D509 Iron deficiency anemia, unspecified: Secondary | ICD-10-CM | POA: Diagnosis not present

## 2015-05-28 DIAGNOSIS — M2041 Other hammer toe(s) (acquired), right foot: Secondary | ICD-10-CM | POA: Diagnosis not present

## 2015-05-28 DIAGNOSIS — Z9889 Other specified postprocedural states: Secondary | ICD-10-CM

## 2015-05-28 DIAGNOSIS — I10 Essential (primary) hypertension: Secondary | ICD-10-CM | POA: Diagnosis not present

## 2015-05-29 LAB — CBC WITH DIFFERENTIAL/PLATELET
Basophils Absolute: 0 10*3/uL (ref 0.0–0.1)
Basophils Relative: 1 % (ref 0–1)
EOS ABS: 0.1 10*3/uL (ref 0.0–0.7)
Eosinophils Relative: 4 % (ref 0–5)
HCT: 41.8 % (ref 36.0–46.0)
HEMOGLOBIN: 13.2 g/dL (ref 12.0–15.0)
LYMPHS ABS: 0.7 10*3/uL (ref 0.7–4.0)
Lymphocytes Relative: 22 % (ref 12–46)
MCH: 29.3 pg (ref 26.0–34.0)
MCHC: 31.6 g/dL (ref 30.0–36.0)
MCV: 92.7 fL (ref 78.0–100.0)
MPV: 10.5 fL (ref 8.6–12.4)
Monocytes Absolute: 0.4 10*3/uL (ref 0.1–1.0)
Monocytes Relative: 11 % (ref 3–12)
NEUTROS ABS: 2.1 10*3/uL (ref 1.7–7.7)
NEUTROS PCT: 62 % (ref 43–77)
Platelets: 227 10*3/uL (ref 150–400)
RBC: 4.51 MIL/uL (ref 3.87–5.11)
RDW: 13.7 % (ref 11.5–15.5)
WBC: 3.4 10*3/uL — ABNORMAL LOW (ref 4.0–10.5)

## 2015-05-29 LAB — IRON AND TIBC
%SAT: 13 % (ref 11–50)
Iron: 48 ug/dL (ref 45–160)
TIBC: 365 ug/dL (ref 250–450)
UIBC: 317 ug/dL (ref 125–400)

## 2015-05-29 LAB — FERRITIN: FERRITIN: 17 ng/mL (ref 10–291)

## 2015-05-29 NOTE — Progress Notes (Signed)
Subjective:     Patient ID: Andrea Jackson, female   DOB: August 24, 1934, 81 y.o.   MRN: [id number]  HPI patient presents stating my toe is doing very well with minimal discomfort and the wound edges are healing well   Review of Systems     Objective:   Physical Exam Neurovascular status intact toe in good alignment wound edges well coapted with good positional component of the third toe    Assessment:     Doing well post arthroplasty distal digit 3 right    Plan:     Remove stitches applied sterile dressing advised on gradual return soft shoe and reviewed x-ray. Reappoint to recheck in about 4 weeks

## 2015-06-03 DIAGNOSIS — Z23 Encounter for immunization: Secondary | ICD-10-CM | POA: Diagnosis not present

## 2015-06-03 DIAGNOSIS — M7122 Synovial cyst of popliteal space [Baker], left knee: Secondary | ICD-10-CM | POA: Diagnosis not present

## 2015-06-03 DIAGNOSIS — M1712 Unilateral primary osteoarthritis, left knee: Secondary | ICD-10-CM | POA: Diagnosis not present

## 2015-06-09 ENCOUNTER — Telehealth: Payer: Self-pay | Admitting: Internal Medicine

## 2015-06-09 NOTE — Telephone Encounter (Signed)
Routing to [facility] for results.  

## 2015-06-09 NOTE — Telephone Encounter (Signed)
PATIENT HAD LABS DRAWN A FEW WEEKS AGO AND HAS NOT HEARD RESULTS  PLEASE CALL [phone]

## 2015-06-10 NOTE — Progress Notes (Signed)
Quick Note:  Iron stores normal but low normal. Iron and hgb better. Given h/o heme+ stools and h/o ida, would offer her return OV to discuss colonoscopy +/- EGD as per original plan. We had been waiting for cardiology clearance which delayed work up before. ______

## 2015-06-10 NOTE — Telephone Encounter (Signed)
See result note.  

## 2015-06-10 NOTE — Telephone Encounter (Signed)
I spoke with the pt.  

## 2015-06-11 NOTE — Progress Notes (Signed)
Quick Note:  Noted. Make appt that is convenient for patient. ______

## 2015-06-24 DIAGNOSIS — M1712 Unilateral primary osteoarthritis, left knee: Secondary | ICD-10-CM | POA: Diagnosis not present

## 2015-06-24 DIAGNOSIS — D649 Anemia, unspecified: Secondary | ICD-10-CM | POA: Diagnosis not present

## 2015-06-24 DIAGNOSIS — M7122 Synovial cyst of popliteal space [Baker], left knee: Secondary | ICD-10-CM | POA: Diagnosis not present

## 2015-06-24 DIAGNOSIS — I10 Essential (primary) hypertension: Secondary | ICD-10-CM | POA: Diagnosis not present

## 2015-06-24 DIAGNOSIS — K210 Gastro-esophageal reflux disease with esophagitis: Secondary | ICD-10-CM | POA: Diagnosis not present

## 2015-06-27 DIAGNOSIS — M1990 Unspecified osteoarthritis, unspecified site: Secondary | ICD-10-CM | POA: Diagnosis not present

## 2015-07-03 DIAGNOSIS — M1712 Unilateral primary osteoarthritis, left knee: Secondary | ICD-10-CM | POA: Diagnosis not present

## 2015-07-03 DIAGNOSIS — R0600 Dyspnea, unspecified: Secondary | ICD-10-CM | POA: Diagnosis not present

## 2015-07-03 DIAGNOSIS — K210 Gastro-esophageal reflux disease with esophagitis: Secondary | ICD-10-CM | POA: Diagnosis not present

## 2015-07-03 DIAGNOSIS — F411 Generalized anxiety disorder: Secondary | ICD-10-CM | POA: Diagnosis not present

## 2015-07-03 DIAGNOSIS — N3090 Cystitis, unspecified without hematuria: Secondary | ICD-10-CM | POA: Diagnosis not present

## 2015-07-07 ENCOUNTER — Encounter (HOSPITAL_COMMUNITY): Payer: Self-pay | Admitting: Emergency Medicine

## 2015-07-07 ENCOUNTER — Emergency Department (HOSPITAL_COMMUNITY): Payer: Medicare Other

## 2015-07-07 ENCOUNTER — Emergency Department (HOSPITAL_COMMUNITY)
Admission: EM | Admit: 2015-07-07 | Discharge: 2015-07-07 | Disposition: A | Discharge status: Home / Self Care | Payer: Medicare Other | Attending: Emergency Medicine | Admitting: Emergency Medicine

## 2015-07-07 DIAGNOSIS — Z9049 Acquired absence of other specified parts of digestive tract: Secondary | ICD-10-CM | POA: Insufficient documentation

## 2015-07-07 DIAGNOSIS — Z792 Long term (current) use of antibiotics: Secondary | ICD-10-CM | POA: Diagnosis not present

## 2015-07-07 DIAGNOSIS — I10 Essential (primary) hypertension: Secondary | ICD-10-CM | POA: Diagnosis not present

## 2015-07-07 DIAGNOSIS — M1990 Unspecified osteoarthritis, unspecified site: Secondary | ICD-10-CM | POA: Diagnosis not present

## 2015-07-07 DIAGNOSIS — R0602 Shortness of breath: Secondary | ICD-10-CM | POA: Insufficient documentation

## 2015-07-07 DIAGNOSIS — F419 Anxiety disorder, unspecified: Secondary | ICD-10-CM | POA: Diagnosis not present

## 2015-07-07 DIAGNOSIS — R079 Chest pain, unspecified: Secondary | ICD-10-CM | POA: Diagnosis not present

## 2015-07-07 DIAGNOSIS — I2510 Atherosclerotic heart disease of native coronary artery without angina pectoris: Secondary | ICD-10-CM | POA: Insufficient documentation

## 2015-07-07 DIAGNOSIS — D509 Iron deficiency anemia, unspecified: Secondary | ICD-10-CM | POA: Diagnosis not present

## 2015-07-07 DIAGNOSIS — Z7982 Long term (current) use of aspirin: Secondary | ICD-10-CM | POA: Insufficient documentation

## 2015-07-07 DIAGNOSIS — K2100 Gastro-esophageal reflux disease with esophagitis, without bleeding: Secondary | ICD-10-CM

## 2015-07-07 DIAGNOSIS — Z791 Long term (current) use of non-steroidal anti-inflammatories (NSAID): Secondary | ICD-10-CM | POA: Diagnosis not present

## 2015-07-07 DIAGNOSIS — R1013 Epigastric pain: Secondary | ICD-10-CM | POA: Diagnosis not present

## 2015-07-07 DIAGNOSIS — R0789 Other chest pain: Secondary | ICD-10-CM | POA: Diagnosis not present

## 2015-07-07 DIAGNOSIS — Z79899 Other long term (current) drug therapy: Secondary | ICD-10-CM | POA: Insufficient documentation

## 2015-07-07 DIAGNOSIS — R110 Nausea: Secondary | ICD-10-CM | POA: Diagnosis not present

## 2015-07-07 DIAGNOSIS — Z85828 Personal history of other malignant neoplasm of skin: Secondary | ICD-10-CM | POA: Insufficient documentation

## 2015-07-07 DIAGNOSIS — Z90711 Acquired absence of uterus with remaining cervical stump: Secondary | ICD-10-CM | POA: Insufficient documentation

## 2015-07-07 DIAGNOSIS — E039 Hypothyroidism, unspecified: Secondary | ICD-10-CM | POA: Insufficient documentation

## 2015-07-07 DIAGNOSIS — R0902 Hypoxemia: Secondary | ICD-10-CM | POA: Diagnosis not present

## 2015-07-07 DIAGNOSIS — I252 Old myocardial infarction: Secondary | ICD-10-CM | POA: Insufficient documentation

## 2015-07-07 DIAGNOSIS — R030 Elevated blood-pressure reading, without diagnosis of hypertension: Secondary | ICD-10-CM | POA: Diagnosis not present

## 2015-07-07 LAB — CBC
HCT: 36 % (ref 36.0–46.0)
Hemoglobin: 11.7 g/dL — ABNORMAL LOW (ref 12.0–15.0)
MCH: 29.1 pg (ref 26.0–34.0)
MCHC: 32.5 g/dL (ref 30.0–36.0)
MCV: 89.6 fL (ref 78.0–100.0)
Platelets: 324 10*3/uL (ref 150–400)
RBC: 4.02 MIL/uL (ref 3.87–5.11)
RDW: 13.2 % (ref 11.5–15.5)
WBC: 8.7 10*3/uL (ref 4.0–10.5)

## 2015-07-07 LAB — BASIC METABOLIC PANEL
ANION GAP: 8 (ref 5–15)
BUN: 30 mg/dL — ABNORMAL HIGH (ref 6–20)
CO2: 26 mmol/L (ref 22–32)
Calcium: 8.7 mg/dL — ABNORMAL LOW (ref 8.9–10.3)
Chloride: 102 mmol/L (ref 101–111)
Creatinine, Ser: 1.55 mg/dL — ABNORMAL HIGH (ref 0.44–1.00)
GFR calc Af Amer: 36 mL/min — ABNORMAL LOW (ref >60)
GFR, EST NON AFRICAN AMERICAN: 31 mL/min — AB (ref >60)
GLUCOSE: 113 mg/dL — AB (ref 65–99)
POTASSIUM: 4.8 mmol/L (ref 3.5–5.1)
Sodium: 136 mmol/L (ref 135–145)

## 2015-07-07 LAB — TROPONIN I
Troponin I: 0.03 ng/mL (ref <0.031)
Troponin I: 0.03 ng/mL (ref <0.031)

## 2015-07-07 MED ORDER — SUCRALFATE 1 G PO TABS
1.0000 g | ORAL_TABLET | Freq: Three times a day (TID) | ORAL | Status: DC
  Administered 2015-07-07: 1 g via ORAL
  Filled 2015-07-07: qty 1

## 2015-07-07 MED ORDER — METOCLOPRAMIDE HCL 10 MG PO TABS: 10.0000 mg | ORAL_TABLET | Freq: Every day | ORAL | Status: DC

## 2015-07-07 MED ORDER — PANTOPRAZOLE SODIUM 40 MG IV SOLR
40.0000 mg | Freq: Once | INTRAVENOUS | Status: AC
  Administered 2015-07-07: 40 mg via INTRAVENOUS
  Filled 2015-07-07: qty 40

## 2015-07-07 MED ORDER — HYDROCODONE-ACETAMINOPHEN 5-325 MG PO TABS: 1.0000 | ORAL_TABLET | ORAL | Status: DC | PRN

## 2015-07-07 MED ORDER — GI COCKTAIL ~~LOC~~
30.0000 mL | Freq: Once | ORAL | Status: AC
  Administered 2015-07-07: 30 mL via ORAL
  Filled 2015-07-07: qty 30

## 2015-07-07 MED ORDER — ONDANSETRON HCL 4 MG/2ML IJ SOLN
4.0000 mg | Freq: Once | INTRAMUSCULAR | Status: AC
  Administered 2015-07-07: 4 mg via INTRAVENOUS
  Filled 2015-07-07: qty 2

## 2015-07-07 MED ORDER — METOCLOPRAMIDE HCL 5 MG/ML IJ SOLN
5.0000 mg | Freq: Once | INTRAMUSCULAR | Status: AC
  Administered 2015-07-07: 5 mg via INTRAVENOUS
  Filled 2015-07-07: qty 2

## 2015-07-07 MED ORDER — SUCRALFATE 1 G PO TABS: 1.0000 g | ORAL_TABLET | Freq: Four times a day (QID) | ORAL | Status: DC

## 2015-07-07 MED ORDER — ESOMEPRAZOLE MAGNESIUM 40 MG PO CPDR: 40.0000 mg | DELAYED_RELEASE_CAPSULE | Freq: Two times a day (BID) | ORAL | Status: DC

## 2015-07-07 MED ORDER — METOCLOPRAMIDE HCL 5 MG/ML IJ SOLN: 10.0000 mg | Freq: Once | INTRAMUSCULAR | Status: DC

## 2015-07-07 MED ORDER — MORPHINE SULFATE (PF) 2 MG/ML IV SOLN
2.0000 mg | INTRAVENOUS | Status: DC | PRN
Start: 2015-07-07 — End: 2015-07-07
  Administered 2015-07-07: 2 mg via INTRAVENOUS
  Filled 2015-07-07: qty 1
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
## 2015-07-07 NOTE — Discharge Instructions (Signed)
Stop diclofenac. Follow-up with Dr.Rourk. Increase Nexium to twice per day. Elevate the head of your bed   Esophagitis Esophagitis is inflammation of the esophagus. The esophagus is the tube that carries food and liquids from your mouth to your stomach. Esophagitis can cause soreness or pain in the esophagus. This condition can make it difficult and painful to swallow.  CAUSES Most causes of esophagitis are not serious. Common causes of this condition include:  Gastroesophageal reflux disease (GERD). This is when stomach contents move back up into the esophagus (reflux).  Repeated vomiting.  An allergic-type reaction, especially caused by food allergies (eosinophilic esophagitis).  Injury to the esophagus by swallowing large pills with or without water, or swallowing certain types of medicines.  Swallowing (ingesting) harmful chemicals, such as household cleaning products.  Heavy alcohol use.  An infection of the esophagus.This most often occurs in people who have a weakened immune system.  Radiation or chemotherapy treatment for cancer.  Certain diseases such as sarcoidosis, Crohn disease, and scleroderma. SYMPTOMS Symptoms of this condition include:  Difficult or painful swallowing.  Pain with swallowing acidic liquids, such as citrus juices.  Pain with burping.  Chest pain.  Difficulty breathing.  Nausea.  Vomiting.  Pain in the abdomen.  Weight loss.  Ulcers in the mouth.  Patches of white material in the mouth (candidiasis).  Fever.  Coughing up blood or vomiting blood.  Stool that is black, tarry, or bright red. DIAGNOSIS Your health care provider will take a medical history and perform a physical exam. You may also have other tests, including:  An endoscopy to examine your stomach and esophagus with a small camera.  A test that measures the acidity level in your esophagus.  A test that measures how much pressure is on your esophagus.  A  barium swallow or modified barium swallow to show the shape, size, and functioning of your esophagus.  Allergy tests. TREATMENT Treatment for this condition depends on the cause of your esophagitis. In some cases, steroids or other medicines may be given to help relieve your symptoms or to treat the underlying cause of your condition. You may have to make some lifestyle changes, such as:  Avoiding alcohol.  Quitting smoking.  Changing your diet.  Exercising.  Changing your sleep habits and your sleep environment. HOME CARE INSTRUCTIONS Take these actions to decrease your discomfort and to help avoid complications. Diet  Follow a diet as recommended by your health care provider. This may involve avoiding foods and drinks such as:  Coffee and tea (with or without caffeine).  Drinks that contain alcohol.  Energy drinks and sports drinks.  Carbonated drinks or sodas.  Chocolate and cocoa.  Peppermint and mint flavorings.  Garlic and onions.  Horseradish.  Spicy and acidic foods, including peppers, chili powder, curry powder, vinegar, hot sauces, and barbecue sauce.  Citrus fruit juices and citrus fruits, such as oranges, lemons, and limes.  Tomato-based foods, such as red sauce, chili, salsa, and pizza with red sauce.  Fried and fatty foods, such as donuts, french fries, potato chips, and high-fat dressings.  High-fat meats, such as hot dogs and fatty cuts of red and white meats, such as rib eye steak, sausage, ham, and bacon.  High-fat dairy items, such as whole milk, butter, and cream cheese.  Eat small, frequent meals instead of large meals.  Avoid drinking large amounts of liquid with your meals.  Avoid eating meals during the 2-3 hours before bedtime.  Avoid lying down right  after you eat.  Do not exercise right after you eat.  Avoid foods and drinks that seem to make your symptoms worse. General Instructions  Pay attention to any changes in your  symptoms.  Take over-the-counter and prescription medicines only as told by your health care provider. Do not take aspirin, ibuprofen, or other NSAIDs unless your health care provider told you to do so.  If you have trouble taking pills, use a pill splitter to decrease the size of the pill. This will decrease the chance of the pill getting stuck or injuring your esophagus on the way down. Also, drink water after you take a pill.  Do not use any tobacco products, including cigarettes, chewing tobacco, and e-cigarettes. If you need help quitting, ask your health care provider.  Wear loose-fitting clothing. Do not wear anything tight around your waist that causes pressure on your abdomen.  Raise (elevate) the head of your bed about 6 inches (15 cm).  Try to reduce your stress, such as with yoga or meditation. If you need help reducing stress, ask your health care provider.  If you are overweight, reduce your weight to an amount that is healthy for you. Ask your health care provider for guidance about a safe weight loss goal.  Keep all follow-up visits as told by your health care provider. This is important. SEEK MEDICAL CARE IF:  You have new symptoms.  You have unexplained weight loss.  You have difficulty swallowing, or it hurts to swallow.  You have wheezing or a persistent cough.  Your symptoms do not improve with treatment.  You have frequent heartburn for more than two weeks. SEEK IMMEDIATE MEDICAL CARE IF:  You have severe pain in your arms, neck, jaw, teeth, or back.  You feel sweaty, dizzy, or light-headed.  You have chest pain or shortness of breath.  You vomit and your vomit looks like blood or coffee grounds.  Your stool is bloody or black.  You have a fever.  You cannot swallow, drink, or eat.   This information is not intended to replace advice given to you by your health care provider. Make sure you discuss any questions you have with your health care  provider.   Document Released: 10/06/2004 Document Revised: 05/20/2015 Document Reviewed: 12/24/2014 Elsevier Interactive Patient Education 2016 [place].  Gastroesophageal Reflux Disease, Adult Normally, food travels down the esophagus and stays in the stomach to be digested. If a person has gastroesophageal reflux disease (GERD), food and stomach acid move back up into the esophagus. When this happens, the esophagus becomes sore and swollen (inflamed). Over time, GERD can make small holes (ulcers) in the lining of the esophagus. HOME CARE Diet  Follow a diet as told by your doctor. You may need to avoid foods and drinks such as:  Coffee and tea (with or without caffeine).  Drinks that contain alcohol.  Energy drinks and sports drinks.  Carbonated drinks or sodas.  Chocolate and cocoa.  Peppermint and mint flavorings.  Garlic and onions.  Horseradish.  Spicy and acidic foods, such as peppers, chili powder, curry powder, vinegar, hot sauces, and BBQ sauce.  Citrus fruit juices and citrus fruits, such as oranges, lemons, and limes.  Tomato-based foods, such as red sauce, chili, salsa, and pizza with red sauce.  Fried and fatty foods, such as donuts, french fries, potato chips, and high-fat dressings.  High-fat meats, such as hot dogs, rib eye steak, sausage, ham, and bacon.  High-fat dairy items, such as  whole milk, butter, and cream cheese.  Eat small meals often. Avoid eating large meals.  Avoid drinking large amounts of liquid with your meals.  Avoid eating meals during the 2-3 hours before bedtime.  Avoid lying down right after you eat.  Do not exercise right after you eat. General Instructions  Pay attention to any changes in your symptoms.  Take over-the-counter and prescription medicines only as told by your doctor. Do not take aspirin, ibuprofen, or other NSAIDs unless your doctor says it is okay.  Do not use any tobacco products, including  cigarettes, chewing tobacco, and e-cigarettes. If you need help quitting, ask your doctor.  Wear loose clothes. Do not wear anything tight around your waist.  Raise (elevate) the head of your bed about 6 inches (15 cm).  Try to lower your stress. If you need help doing this, ask your doctor.  If you are overweight, lose an amount of weight that is healthy for you. Ask your doctor about a safe weight loss goal.  Keep all follow-up visits as told by your doctor. This is important. GET HELP IF:  You have new symptoms.  You lose weight and you do not know why it is happening.  You have trouble swallowing, or it hurts to swallow.  You have wheezing or a cough that keeps happening.  Your symptoms do not get better with treatment.  You have a hoarse voice. GET HELP RIGHT AWAY IF:  You have pain in your arms, neck, jaw, teeth, or back.  You feel sweaty, dizzy, or light-headed.  You have chest pain or shortness of breath.  You throw up (vomit) and your throw up looks like blood or coffee grounds.  You pass out (faint).  Your poop (stool) is bloody or black.  You cannot swallow, drink, or eat.   This information is not intended to replace advice given to you by your health care provider. Make sure you discuss any questions you have with your health care provider.   Document Released: 02/15/2008 Document Revised: 05/20/2015 Document Reviewed: 12/24/2014 Elsevier Interactive Patient Education [employer].

## 2015-07-07 NOTE — ED Notes (Signed)
PT c/o intermittent central chest pressure more at night while laying down x1 week. EMS reported 324 aspirin given in route. PT denies any chest pain at this time and c/o soreness.

## 2015-07-27 ENCOUNTER — Other Ambulatory Visit: Payer: Self-pay

## 2015-07-27 ENCOUNTER — Ambulatory Visit (INDEPENDENT_AMBULATORY_CARE_PROVIDER_SITE_OTHER): Payer: Medicare Other | Admitting: Gastroenterology

## 2015-07-27 ENCOUNTER — Encounter: Payer: Self-pay | Admitting: Gastroenterology

## 2015-07-27 VITALS — BP 127/75 | HR 77 | Temp 97.2°F | Ht 64.0 in | Wt 125.8 lb

## 2015-07-27 DIAGNOSIS — R1319 Other dysphagia: Secondary | ICD-10-CM | POA: Insufficient documentation

## 2015-07-27 DIAGNOSIS — D509 Iron deficiency anemia, unspecified: Secondary | ICD-10-CM | POA: Diagnosis not present

## 2015-07-27 DIAGNOSIS — R195 Other fecal abnormalities: Secondary | ICD-10-CM | POA: Insufficient documentation

## 2015-07-27 DIAGNOSIS — R1013 Epigastric pain: Secondary | ICD-10-CM | POA: Diagnosis not present

## 2015-07-27 DIAGNOSIS — R300 Dysuria: Secondary | ICD-10-CM | POA: Insufficient documentation

## 2015-07-27 DIAGNOSIS — R1310 Dysphagia, unspecified: Secondary | ICD-10-CM

## 2015-07-27 DIAGNOSIS — R1314 Dysphagia, pharyngoesophageal phase: Secondary | ICD-10-CM

## 2015-07-27 MED ORDER — PEG 3350-KCL-NA BICARB-NACL 420 G PO SOLR: 4000.0000 mL | Freq: Once | ORAL | Status: DC
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
## 2015-07-27 NOTE — Progress Notes (Signed)
cc'ed to pcp °

## 2015-07-27 NOTE — Patient Instructions (Signed)
Colonoscopy and upper endoscopy as planned. See separate instructions.  Please take urine specimen to the lab. We will call you with results when available.

## 2015-07-28 DIAGNOSIS — I10 Essential (primary) hypertension: Secondary | ICD-10-CM | POA: Diagnosis not present

## 2015-07-28 DIAGNOSIS — R079 Chest pain, unspecified: Secondary | ICD-10-CM | POA: Diagnosis not present

## 2015-07-28 LAB — URINALYSIS W MICROSCOPIC + REFLEX CULTURE
BACTERIA UA: NONE SEEN [HPF]
BILIRUBIN URINE: NEGATIVE
CRYSTALS: NONE SEEN [HPF]
Casts: NONE SEEN [LPF]
Glucose, UA: NEGATIVE
KETONES UR: NEGATIVE
Nitrite: NEGATIVE
Protein, ur: NEGATIVE
Specific Gravity, Urine: 1.003 (ref 1.001–1.035)
Squamous Epithelial / LPF: NONE SEEN [HPF] (ref <=5)
Yeast: NONE SEEN [HPF]
pH: 6 (ref 5.0–8.0)

## 2015-07-29 NOTE — Patient Instructions (Signed)
Andrea Jackson  07/29/2015     @PREFPERIOPPHARMACY @   Your procedure is scheduled on 08/03/2015.  Report to [facility] at 8:45 A.M.  Call this number if you have problems the morning of surgery:  [phone]   Remember:  Do not eat food or drink liquids after midnight.  Take these medicines the morning of surgery with A SIP OF WATER Xanax, Voltaren, Nexium, Synthroid, Lisinopril, Reglan, Tramadol, Phenergan   Do not wear jewelry, make-up or nail polish.  Do not wear lotions, powders, or perfumes.  You may wear deodorant.  Do not shave 48 hours prior to surgery.  Men may shave face and neck.  Do not bring valuables to the hospital.  [facility] is not responsible for any belongings or valuables.  Contacts, dentures or bridgework may not be worn into surgery.  Leave your suitcase in the car.  After surgery it may be brought to your room.  For patients admitted to the hospital, discharge time will be determined by your treatment team.  Patients discharged the day of surgery will not be allowed to drive home.    Please read over the following fact sheets that you were given. Anesthesia Post-op Instructions     PATIENT INSTRUCTIONS POST-ANESTHESIA  IMMEDIATELY FOLLOWING SURGERY:  Do not drive or operate machinery for the first twenty four hours after surgery.  Do not make any important decisions for twenty four hours after surgery or while taking narcotic pain medications or sedatives.  If you develop intractable nausea and vomiting or a severe headache please notify your doctor immediately.  FOLLOW-UP:  Please make an appointment with your surgeon as instructed. You do not need to follow up with anesthesia unless specifically instructed to do so.  WOUND CARE INSTRUCTIONS (if applicable):  Keep a dry clean dressing on the anesthesia/puncture wound site if there is drainage.  Once the wound has quit draining you may leave it open to air.  Generally you should leave the bandage  intact for twenty four hours unless there is drainage.  If the epidural site drains for more than 36-48 hours please call the anesthesia department.  QUESTIONS?:  Please feel free to call your physician or the hospital operator if you have any questions, and they will be happy to assist you.      Esophagogastroduodenoscopy Esophagogastroduodenoscopy (EGD) is a procedure that is used to examine the lining of the esophagus, stomach, and first part of the small intestine (duodenum). A long, flexible, lighted tube with a camera attached (endoscope) is inserted down the throat to view these organs. This procedure is done to detect problems or abnormalities, such as inflammation, bleeding, ulcers, or growths, in order to treat them. The procedure lasts 5-20 minutes. It is usually an outpatient procedure, but it may need to be performed in a hospital in emergency cases. LET [facility] CARE PROVIDER KNOW ABOUT:  Any allergies you have.  All medicines you are taking, including vitamins, herbs, eye drops, creams, and over-the-counter medicines.  Previous problems you or members of your family have had with the use of anesthetics.  Any blood disorders you have.  Previous surgeries you have had.  Medical conditions you have. RISKS AND COMPLICATIONS Generally, this is a safe procedure. However, problems can occur and include:  Infection.  Bleeding.  Tearing (perforation) of the esophagus, stomach, or duodenum.  Difficulty breathing or not being able to breathe.  Excessive sweating.  Spasms of the larynx.  Slowed heartbeat.  Low blood  pressure. BEFORE THE PROCEDURE  Do not eat or drink anything after midnight on the night before the procedure or as directed by your health care provider.  Do not take your regular medicines before the procedure if your health care provider asks you not to. Ask your health care provider about changing or stopping those medicines.  If you wear dentures, be  prepared to remove them before the procedure.  Arrange for someone to drive you home after the procedure. PROCEDURE  A numbing medicine (local anesthetic) may be sprayed in your throat for comfort and to stop you from gagging or coughing.  You will have an IV tube inserted in a vein in your hand or arm. You will receive medicines and fluids through this tube.  You will be given a medicine to relax you (sedative).  A pain reliever will be given through the IV tube.  A mouth guard may be placed in your mouth to protect your teeth and to keep you from biting on the endoscope.  You will be asked to lie on your left side.  The endoscope will be inserted down your throat and into your esophagus, stomach, and duodenum.  Air will be put through the endoscope to allow your health care provider to clearly view the lining of your esophagus.  The lining of your esophagus, stomach, and duodenum will be examined. During the exam, your health care provider may:  Remove tissue to be examined under a microscope (biopsy) for inflammation, infection, or other medical problems.  Remove growths.  Remove objects (foreign bodies) that are stuck.  Treat any bleeding with medicines or other devices that stop tissues from bleeding (hot cautery, clipping devices).  Widen (dilate) or stretch narrowed areas of your esophagus and stomach.  The endoscope will be withdrawn. AFTER THE PROCEDURE  You will be taken to a recovery area for observation. Your blood pressure, heart rate, breathing rate, and blood oxygen level will be monitored often until the medicines you were given have worn off.  Do not eat or drink anything until the numbing medicine has worn off and your gag reflex has returned. You may choke.  Your health care provider should be able to discuss his or her findings with you. It will take longer to discuss the test results if any biopsies were taken.   This information is not intended to  replace advice given to you by your health care provider. Make sure you discuss any questions you have with your health care provider.   Document Released: 12/30/2004 Document Revised: 09/19/2014 Document Reviewed: 08/01/2012 Elsevier Interactive Patient Education 2016 [employer]. Colonoscopy A colonoscopy is an exam to look at the entire large intestine (colon). This exam can help find problems such as tumors, polyps, inflammation, and areas of bleeding. The exam takes about 1 hour.  LET [facility] CARE PROVIDER KNOW ABOUT:   Any allergies you have.  All medicines you are taking, including vitamins, herbs, eye drops, creams, and over-the-counter medicines.  Previous problems you or members of your family have had with the use of anesthetics.  Any blood disorders you have.  Previous surgeries you have had.  Medical conditions you have. RISKS AND COMPLICATIONS  Generally, this is a safe procedure. However, as with any procedure, complications can occur. Possible complications include:  Bleeding.  Tearing or rupture of the colon wall.  Reaction to medicines given during the exam.  Infection (rare). BEFORE THE PROCEDURE   Ask your health care provider about changing  or stopping your regular medicines.  You may be prescribed an oral bowel prep. This involves drinking a large amount of medicated liquid, starting the day before your procedure. The liquid will cause you to have multiple loose stools until your stool is almost clear or light green. This cleans out your colon in preparation for the procedure.  Do not eat or drink anything else once you have started the bowel prep, unless your health care provider tells you it is safe to do so.  Arrange for someone to drive you home after the procedure. PROCEDURE   You will be given medicine to help you relax (sedative).  You will lie on your side with your knees bent.  A long, flexible tube with a light and camera on the end  (colonoscope) will be inserted through the rectum and into the colon. The camera sends video back to a computer screen as it moves through the colon. The colonoscope also releases carbon dioxide gas to inflate the colon. This helps your health care provider see the area better.  During the exam, your health care provider may take a small tissue sample (biopsy) to be examined under a microscope if any abnormalities are found.  The exam is finished when the entire colon has been viewed. AFTER THE PROCEDURE   Do not drive for 24 hours after the exam.  You may have a small amount of blood in your stool.  You may pass moderate amounts of gas and have mild abdominal cramping or bloating. This is caused by the gas used to inflate your colon during the exam.  Ask when your test results will be ready and how you will get your results. Make sure you get your test results.   This information is not intended to replace advice given to you by your health care provider. Make sure you discuss any questions you have with your health care provider.   Document Released: 08/26/2000 Document Revised: 06/19/2013 Document Reviewed: 05/06/2013 Elsevier Interactive Patient Education [employer].

## 2015-07-30 ENCOUNTER — Telehealth: Payer: Self-pay | Admitting: Internal Medicine

## 2015-07-30 ENCOUNTER — Other Ambulatory Visit: Payer: Self-pay | Admitting: Gastroenterology

## 2015-07-30 ENCOUNTER — Encounter (HOSPITAL_COMMUNITY)
Admission: RE | Admit: 2015-07-30 | Discharge: 2015-07-30 | Disposition: A | Discharge status: Home / Self Care | Payer: Medicare Other | Source: Ambulatory Visit | Attending: Internal Medicine | Admitting: Internal Medicine

## 2015-07-30 ENCOUNTER — Encounter (HOSPITAL_COMMUNITY): Payer: Self-pay

## 2015-07-30 DIAGNOSIS — Z01818 Encounter for other preprocedural examination: Secondary | ICD-10-CM | POA: Diagnosis not present

## 2015-07-30 DIAGNOSIS — R1013 Epigastric pain: Secondary | ICD-10-CM | POA: Insufficient documentation

## 2015-07-30 DIAGNOSIS — D509 Iron deficiency anemia, unspecified: Secondary | ICD-10-CM | POA: Diagnosis not present

## 2015-07-30 DIAGNOSIS — R1310 Dysphagia, unspecified: Secondary | ICD-10-CM | POA: Diagnosis not present

## 2015-07-30 LAB — BASIC METABOLIC PANEL
ANION GAP: 8 (ref 5–15)
BUN: 16 mg/dL (ref 6–20)
CALCIUM: 9 mg/dL (ref 8.9–10.3)
CHLORIDE: 102 mmol/L (ref 101–111)
CO2: 28 mmol/L (ref 22–32)
Creatinine, Ser: 0.92 mg/dL (ref 0.44–1.00)
GFR calc Af Amer: >60 mL/min (ref >60)
GFR calc non Af Amer: 58 mL/min — ABNORMAL LOW (ref >60)
GLUCOSE: 98 mg/dL (ref 65–99)
POTASSIUM: 5 mmol/L (ref 3.5–5.1)
Sodium: 138 mmol/L (ref 135–145)

## 2015-07-30 LAB — URINE CULTURE: Colony Count: >=100000

## 2015-07-30 LAB — CBC
HEMATOCRIT: 39.6 % (ref 36.0–46.0)
HEMOGLOBIN: 12.5 g/dL (ref 12.0–15.0)
MCH: 29.3 pg (ref 26.0–34.0)
MCHC: 31.6 g/dL (ref 30.0–36.0)
MCV: 92.7 fL (ref 78.0–100.0)
Platelets: 202 10*3/uL (ref 150–400)
RBC: 4.27 MIL/uL (ref 3.87–5.11)
RDW: 14 % (ref 11.5–15.5)
WBC: 6.9 10*3/uL (ref 4.0–10.5)

## 2015-07-30 MED ORDER — CIPROFLOXACIN HCL 500 MG PO TABS: 500.0000 mg | ORAL_TABLET | Freq: Two times a day (BID) | ORAL | Status: DC

## 2015-07-30 NOTE — Telephone Encounter (Signed)
Pt called to see if her results on her labs were back yet. She was in the office Monday. Please call when available [phone]

## 2015-07-30 NOTE — Telephone Encounter (Signed)
See result note.  

## 2015-07-30 NOTE — Progress Notes (Signed)
Quick Note:  Please let patient know her urine culture sensitivities just returned. I wanted to wait on that to make sure we gave her the appropriate antibiotic because she has ongoing symptoms. Recently treated with Bactrim and then Nitrofurantoin. Current bug is resistant to Bactrim.   RX sent for Cipro 500mg  BID for 10 days. If ongoing symptoms, recommend she follow up with her PCP. ______

## 2015-07-30 NOTE — Pre-Procedure Instructions (Signed)
Patient given information to sign up for my chart at home. 

## 2015-07-30 NOTE — Telephone Encounter (Signed)
Routing to [facility]- looks like pt has a UTI.

## 2015-07-30 NOTE — Telephone Encounter (Signed)
I have tried to call pt- LMOM to return call, see lab results.

## 2015-08-03 ENCOUNTER — Ambulatory Visit (HOSPITAL_COMMUNITY)
Admission: RE | Admit: 2015-08-03 | Discharge: 2015-08-03 | Disposition: A | Discharge status: Home / Self Care | Payer: Medicare Other | Source: Ambulatory Visit | Attending: Internal Medicine | Admitting: Internal Medicine

## 2015-08-03 ENCOUNTER — Ambulatory Visit (HOSPITAL_COMMUNITY): Payer: Medicare Other | Admitting: Anesthesiology

## 2015-08-03 ENCOUNTER — Encounter (HOSPITAL_COMMUNITY): Payer: Self-pay | Admitting: Anesthesiology

## 2015-08-03 ENCOUNTER — Encounter (HOSPITAL_COMMUNITY)
Admission: RE | Disposition: A | Discharge status: Home / Self Care | Payer: Self-pay | Source: Ambulatory Visit | Attending: Internal Medicine

## 2015-08-03 DIAGNOSIS — Z9861 Coronary angioplasty status: Secondary | ICD-10-CM | POA: Diagnosis not present

## 2015-08-03 DIAGNOSIS — K319 Disease of stomach and duodenum, unspecified: Secondary | ICD-10-CM | POA: Diagnosis not present

## 2015-08-03 DIAGNOSIS — K649 Unspecified hemorrhoids: Secondary | ICD-10-CM

## 2015-08-03 DIAGNOSIS — R1319 Other dysphagia: Secondary | ICD-10-CM | POA: Diagnosis not present

## 2015-08-03 DIAGNOSIS — I10 Essential (primary) hypertension: Secondary | ICD-10-CM | POA: Diagnosis not present

## 2015-08-03 DIAGNOSIS — I2510 Atherosclerotic heart disease of native coronary artery without angina pectoris: Secondary | ICD-10-CM | POA: Insufficient documentation

## 2015-08-03 DIAGNOSIS — M359 Systemic involvement of connective tissue, unspecified: Secondary | ICD-10-CM | POA: Insufficient documentation

## 2015-08-03 DIAGNOSIS — M1990 Unspecified osteoarthritis, unspecified site: Secondary | ICD-10-CM | POA: Diagnosis not present

## 2015-08-03 DIAGNOSIS — Z791 Long term (current) use of non-steroidal anti-inflammatories (NSAID): Secondary | ICD-10-CM | POA: Insufficient documentation

## 2015-08-03 DIAGNOSIS — K219 Gastro-esophageal reflux disease without esophagitis: Secondary | ICD-10-CM | POA: Insufficient documentation

## 2015-08-03 DIAGNOSIS — K5730 Diverticulosis of large intestine without perforation or abscess without bleeding: Secondary | ICD-10-CM | POA: Insufficient documentation

## 2015-08-03 DIAGNOSIS — K3189 Other diseases of stomach and duodenum: Secondary | ICD-10-CM | POA: Diagnosis not present

## 2015-08-03 DIAGNOSIS — D649 Anemia, unspecified: Secondary | ICD-10-CM | POA: Diagnosis not present

## 2015-08-03 DIAGNOSIS — F419 Anxiety disorder, unspecified: Secondary | ICD-10-CM | POA: Diagnosis not present

## 2015-08-03 DIAGNOSIS — R195 Other fecal abnormalities: Secondary | ICD-10-CM | POA: Insufficient documentation

## 2015-08-03 DIAGNOSIS — Z7982 Long term (current) use of aspirin: Secondary | ICD-10-CM | POA: Diagnosis not present

## 2015-08-03 DIAGNOSIS — Z79899 Other long term (current) drug therapy: Secondary | ICD-10-CM | POA: Diagnosis not present

## 2015-08-03 DIAGNOSIS — R1310 Dysphagia, unspecified: Secondary | ICD-10-CM | POA: Diagnosis not present

## 2015-08-03 DIAGNOSIS — E785 Hyperlipidemia, unspecified: Secondary | ICD-10-CM | POA: Diagnosis not present

## 2015-08-03 DIAGNOSIS — E039 Hypothyroidism, unspecified: Secondary | ICD-10-CM | POA: Insufficient documentation

## 2015-08-03 DIAGNOSIS — R1013 Epigastric pain: Secondary | ICD-10-CM | POA: Diagnosis not present

## 2015-08-03 DIAGNOSIS — I252 Old myocardial infarction: Secondary | ICD-10-CM | POA: Diagnosis not present

## 2015-08-03 DIAGNOSIS — K648 Other hemorrhoids: Secondary | ICD-10-CM | POA: Diagnosis not present

## 2015-08-03 DIAGNOSIS — D509 Iron deficiency anemia, unspecified: Secondary | ICD-10-CM | POA: Insufficient documentation

## 2015-08-03 DIAGNOSIS — K921 Melena: Secondary | ICD-10-CM | POA: Diagnosis not present

## 2015-08-03 HISTORY — PX: ESOPHAGOGASTRODUODENOSCOPY (EGD) WITH PROPOFOL: SHX5813

## 2015-08-03 HISTORY — PX: BIOPSY: SHX5522

## 2015-08-03 HISTORY — PX: COLONOSCOPY WITH PROPOFOL: SHX5780

## 2015-08-03 SURGERY — COLONOSCOPY WITH PROPOFOL
Anesthesia: Monitor Anesthesia Care

## 2015-08-03 MED ORDER — LIDOCAINE VISCOUS 2 % MT SOLN
15.0000 mL | OROMUCOSAL | Status: AC
  Administered 2015-08-03 (×2): 15 mL via OROMUCOSAL

## 2015-08-03 MED ORDER — LACTATED RINGERS IV SOLN
INTRAVENOUS | Status: DC
  Administered 2015-08-03: 09:00:00 via INTRAVENOUS

## 2015-08-03 MED ORDER — ONDANSETRON HCL 4 MG/2ML IJ SOLN
INTRAMUSCULAR | Status: AC
  Filled 2015-08-03: qty 2

## 2015-08-03 MED ORDER — FENTANYL CITRATE (PF) 100 MCG/2ML IJ SOLN
25.0000 ug | INTRAMUSCULAR | Status: AC
  Administered 2015-08-03 (×2): 25 ug via INTRAVENOUS

## 2015-08-03 MED ORDER — STERILE WATER FOR IRRIGATION IR SOLN
Status: DC | PRN
  Administered 2015-08-03: 1000 mL

## 2015-08-03 MED ORDER — FENTANYL CITRATE (PF) 100 MCG/2ML IJ SOLN: 25.0000 ug | INTRAMUSCULAR | Status: DC | PRN

## 2015-08-03 MED ORDER — LIDOCAINE VISCOUS 2 % MT SOLN
OROMUCOSAL | Status: AC
  Filled 2015-08-03: qty 15

## 2015-08-03 MED ORDER — LIDOCAINE HCL (CARDIAC) 10 MG/ML IV SOLN
INTRAVENOUS | Status: DC | PRN
  Administered 2015-08-03: 25 mg via INTRAVENOUS

## 2015-08-03 MED ORDER — ONDANSETRON HCL 4 MG/2ML IJ SOLN: 4.0000 mg | Freq: Once | INTRAMUSCULAR | Status: DC | PRN

## 2015-08-03 MED ORDER — MIDAZOLAM HCL 2 MG/2ML IJ SOLN
INTRAMUSCULAR | Status: AC
  Filled 2015-08-03: qty 2

## 2015-08-03 MED ORDER — PHENYLEPHRINE 40 MCG/ML (10ML) SYRINGE FOR IV PUSH (FOR BLOOD PRESSURE SUPPORT)
PREFILLED_SYRINGE | INTRAVENOUS | Status: AC
  Filled 2015-08-03: qty 10

## 2015-08-03 MED ORDER — FENTANYL CITRATE (PF) 100 MCG/2ML IJ SOLN
INTRAMUSCULAR | Status: AC
  Filled 2015-08-03: qty 2

## 2015-08-03 MED ORDER — MIDAZOLAM HCL 5 MG/5ML IJ SOLN
INTRAMUSCULAR | Status: DC | PRN
  Administered 2015-08-03 (×2): 1 mg via INTRAVENOUS

## 2015-08-03 MED ORDER — MIDAZOLAM HCL 2 MG/2ML IJ SOLN
1.0000 mg | INTRAMUSCULAR | Status: DC | PRN
  Administered 2015-08-03 (×2): 2 mg via INTRAVENOUS
  Filled 2015-08-03: qty 2

## 2015-08-03 MED ORDER — LIDOCAINE HCL (PF) 1 % IJ SOLN
INTRAMUSCULAR | Status: AC
  Filled 2015-08-03: qty 5

## 2015-08-03 MED ORDER — PROPOFOL 10 MG/ML IV BOLUS
INTRAVENOUS | Status: AC
  Filled 2015-08-03: qty 20

## 2015-08-03 MED ORDER — PROPOFOL 500 MG/50ML IV EMUL
INTRAVENOUS | Status: DC | PRN
  Administered 2015-08-03: 75 ug/kg/min via INTRAVENOUS
  Administered 2015-08-03: 100 ug/kg/min via INTRAVENOUS
  Administered 2015-08-03: 11:00:00 via INTRAVENOUS

## 2015-08-03 MED ORDER — ONDANSETRON HCL 4 MG/2ML IJ SOLN
4.0000 mg | Freq: Once | INTRAMUSCULAR | Status: AC
  Administered 2015-08-03: 4 mg via INTRAVENOUS

## 2015-08-03 SURGICAL SUPPLY — 27 items
BLOCK BITE 60FR ADLT L/F BLUE (MISCELLANEOUS) ×2 IMPLANT
DEVICE CLIP HEMOSTAT 235CM (CLIP) IMPLANT
ELECT REM PT RETURN 9FT ADLT (ELECTROSURGICAL)
ELECTRODE REM PT RTRN 9FT ADLT (ELECTROSURGICAL) IMPLANT
FCP BXJMBJMB 240X2.8X (CUTTING FORCEPS)
FLOOR PAD 36X40 (MISCELLANEOUS) ×3
FORCEPS BIOP RAD 4 LRG CAP 4 (CUTTING FORCEPS) ×2 IMPLANT
FORCEPS BIOP RJ4 240 W/NDL (CUTTING FORCEPS)
FORCEPS BXJMBJMB 240X2.8X (CUTTING FORCEPS) IMPLANT
FORMALIN 10 PREFIL 20ML (MISCELLANEOUS) ×2 IMPLANT
INJECTOR/SNARE I SNARE (MISCELLANEOUS) IMPLANT
KIT ENDO PROCEDURE PEN (KITS) ×3 IMPLANT
MANIFOLD NEPTUNE II (INSTRUMENTS) ×2 IMPLANT
NDL SCLEROTHERAPY 25GX240 (NEEDLE) IMPLANT
NEEDLE SCLEROTHERAPY 25GX240 (NEEDLE) IMPLANT
PAD FLOOR 36X40 (MISCELLANEOUS) IMPLANT
PROBE APC STR FIRE (PROBE) IMPLANT
PROBE INJECTION GOLD (MISCELLANEOUS)
PROBE INJECTION GOLD 7FR (MISCELLANEOUS) IMPLANT
SNARE ROTATE MED OVAL 20MM (MISCELLANEOUS) IMPLANT
SNARE SHORT THROW 13M SML OVAL (MISCELLANEOUS) ×1 IMPLANT
SYR 50ML LL SCALE MARK (SYRINGE) ×2 IMPLANT
SYR INFLATION 60ML (SYRINGE) ×1 IMPLANT
TRAP SPECIMEN MUCOUS 40CC (MISCELLANEOUS) IMPLANT
TUBING INSUFFLATOR CO2MPACT (TUBING) ×3 IMPLANT
TUBING IRRIGATION ENDOGATOR (MISCELLANEOUS) ×2 IMPLANT
WATER STERILE IRR 1000ML POUR (IV SOLUTION) ×2 IMPLANT

## 2015-08-03 NOTE — Anesthesia Preprocedure Evaluation (Signed)
Anesthesia Evaluation  Patient identified by MRN, date of birth, ID band Patient awake    Reviewed: Allergy & Precautions, H&P , Patient's Chart, lab work & pertinent test results  History of Anesthesia Complications Negative for: history of anesthetic complications  Airway Mallampati: I   Neck ROM: Full    Dental  (+) Edentulous Upper, Partial Lower   Pulmonary    breath sounds clear to auscultation       Cardiovascular hypertension, Pt. on medications + CAD and + Past MI   Rhythm:Regular     Neuro/Psych Anxiety    GI/Hepatic PUD, GERD  ,  Endo/Other  Hypothyroidism   Renal/GU      Musculoskeletal   Abdominal   Peds  Hematology   Anesthesia Other Findings   Reproductive/Obstetrics                             Anesthesia Physical Anesthesia Plan  ASA: III  Anesthesia Plan: MAC   Post-op Pain Management:    Induction: Intravenous  Airway Management Planned: Simple Face Mask  Additional Equipment:   Intra-op Plan:   Post-operative Plan:   Informed Consent: I have reviewed the patients History and Physical, chart, labs and discussed the procedure including the risks, benefits and alternatives for the proposed anesthesia with the patient or authorized representative who has indicated his/her understanding and acceptance.     Plan Discussed with:   Anesthesia Plan Comments:         Anesthesia Quick Evaluation

## 2015-08-03 NOTE — Transfer of Care (Signed)
Immediate Anesthesia Transfer of Care Note  Patient: Andrea Jackson  Procedure(s) Performed: Procedure(s) with comments: COLONOSCOPY WITH PROPOFOL (N/A) - procedure 1 cecum time in 1052   time out  1101  total time 9 minutes ESOPHAGOGASTRODUODENOSCOPY (EGD) WITH PROPOFOL (N/A) BIOPSY (N/A) - gastric  Patient Location: PACU  Anesthesia Type:MAC  Level of Consciousness: awake, alert , oriented and patient cooperative  Airway & Oxygen Therapy: Patient Spontanous Breathing and Patient connected to face mask oxygen  Post-op Assessment: Report given to RN and Post -op Vital signs reviewed and stable  Post vital signs: Reviewed and stable  Last Vitals:  Filed Vitals:   08/03/15 1005 08/03/15 1010  BP: 118/67 128/70  Pulse:    Temp:    Resp: 26 22    Complications: No apparent anesthesia complications

## 2015-08-03 NOTE — Anesthesia Procedure Notes (Signed)
Procedure Name: MAC Date/Time: 08/03/2015 10:19 AM Performed by: Andree Elk, Elyas Villamor A Pre-anesthesia Checklist: Patient identified, Timeout performed, Emergency Drugs available, Suction available and Patient being monitored Oxygen Delivery Method: Simple face mask

## 2015-08-03 NOTE — Interval H&P Note (Signed)
History and Physical Interval Note:  08/03/2015 10:13 AM  Andrea Jackson  has presented today for surgery, with the diagnosis of IDA, heme positive stool, epigastric pain, dysphagia  The various methods of treatment have been discussed with the patient and family. After consideration of risks, benefits and other options for treatment, the patient has consented to  Procedure(s) with comments: COLONOSCOPY WITH PROPOFOL (N/A) - 1015 ESOPHAGOGASTRODUODENOSCOPY (EGD) WITH PROPOFOL (N/A) as a surgical intervention .  The patient's history has been reviewed, patient examined, no change in status, stable for surgery.  I have reviewed the patient's chart and labs.  Questions were answered to the patient's satisfaction.     Andrea Jackson  No change. Colonoscopy with possible EGD per plan.  The risks, benefits, limitations, imponderables and alternatives regarding both EGD and colonoscopy have been reviewed with the patient. Questions have been answered. All parties agreeable.

## 2015-08-03 NOTE — Op Note (Signed)
[facility] [address] [place], [address]   ENDOSCOPY PROCEDURE REPORT  PATIENT: Andrea Jackson, Andrea Jackson  MR#: [id number] BIRTHDATE: 1934-02-08 , 20  yrs. old GENDER: female ENDOSCOPIST: R.  Garfield Cornea, MD FACP FACG REFERRED BY:  Consuello Masse, M.D. PROCEDURE DATE:  2015-08-27 PROCEDURE:  EGD w/ biopsy INDICATIONS:  iron deficiency anemia; essentially negative colonoscopy.  Vague esophageal dysphagia.Marland Kitchen MEDICATIONS: Deep sedation per Dr.  [facility] ASA CLASS:      Class III  CONSENT: The risks, benefits, limitations, alternatives and imponderables have been discussed.  The potential for biopsy, esophogeal dilation, etc. have also been reviewed.  Questions have been answered.  All parties agreeable.  Please see the history and physical in the medical record for more information.  DESCRIPTION OF PROCEDURE: After the risks benefits and alternatives of the procedure were thoroughly explained, informed consent was obtained.  The    endoscope was introduced through the mouth and advanced to the second portion of the duodenum , limited by Without limitations. The instrument was slowly withdrawn as the mucosa was fully examined. Estimated blood loss is zero unless otherwise noted in this procedure report.    Baggy, atonic, widely patent tubular esophagus.  Normal-appearing mucosa.  Stomach empty.  Some nodularity of the antrum with focal erythema and erosion.  Please see photographs.  No ulcer or infiltrating process.  Patent pylorus.  Normal-appearing first and second portion of the duodenum.  Biopsies of the abnormal antrum taken.  Retroflexed views revealed no abnormalities.     The scope was then withdrawn from the patient and the procedure completed.  COMPLICATIONS: There were no immediate complications. EBL 3 mL ENDOSCOPIC IMPRESSION: Somewhat baggy esophagus. Nodular inflamed antrum?"status post biopsy  RECOMMENDATIONS: Follow-up on  pathology. See colonoscopy report.  REPEAT EXAM:  eSigned:  R. Garfield Cornea, MD Rosalita Chessman [facility] 2015-08-27 11:24 AM    CC:  CPT CODES: ICD CODES:  The ICD and CPT codes recommended by this software are interpretations from the data that the clinical staff has captured with the software.  The verification of the translation of this report to the ICD and CPT codes and modifiers is the sole responsibility of the health care institution and practicing physician where this report was generated.  [address]. will not be held responsible for the validity of the ICD and CPT codes included on this report.  AMA assumes no liability for data contained or not contained herein. CPT is a Designer, television/film set of the [employer].

## 2015-08-03 NOTE — Anesthesia Postprocedure Evaluation (Signed)
  Anesthesia Post-op Note  Patient: Andrea Jackson  Procedure(s) Performed: Procedure(s) with comments: COLONOSCOPY WITH PROPOFOL (N/A) - procedure 1 cecum time in 1052   time out  1101  total time 9 minutes ESOPHAGOGASTRODUODENOSCOPY (EGD) WITH PROPOFOL (N/A) BIOPSY (N/A) - gastric  Patient Location: PACU  Anesthesia Type:MAC  Level of Consciousness: awake, alert , oriented and patient cooperative  Airway and Oxygen Therapy: Patient Spontanous Breathing and Patient connected to face mask oxygen  Post-op Pain: none  Post-op Assessment: Post-op Vital signs reviewed, Patient's Cardiovascular Status Stable, Respiratory Function Stable, Patent Airway and No signs of Nausea or vomiting              Post-op Vital Signs: Reviewed and stable  Last Vitals:  Filed Vitals:   08/03/15 1005 08/03/15 1010  BP: 118/67 128/70  Pulse:    Temp:    Resp: 26 22    Complications: No apparent anesthesia complications

## 2015-08-03 NOTE — Discharge Instructions (Signed)
Colonoscopy Discharge Instructions  Read the instructions outlined below and refer to this sheet in the next few weeks. These discharge instructions provide you with general information on caring for yourself after you leave the hospital. Your doctor may also give you specific instructions. While your treatment has been planned according to the most current medical practices available, unavoidable complications occasionally occur. If you have any problems or questions after discharge, call Dr. Gala Romney at [phone]. ACTIVITY  You may resume your regular activity, but move at a slower pace for the next 24 hours.   Take frequent rest periods for the next 24 hours.   Walking will help get rid of the air and reduce the bloated feeling in your belly (abdomen).   No driving for 24 hours (because of the medicine (anesthesia) used during the test).    Do not sign any important legal documents or operate any machinery for 24 hours (because of the anesthesia used during the test).  NUTRITION  Drink plenty of fluids.   You may resume your normal diet as instructed by your doctor.   Begin with a light meal and progress to your normal diet. Heavy or fried foods are harder to digest and may make you feel sick to your stomach (nauseated).   Avoid alcoholic beverages for 24 hours or as instructed.  MEDICATIONS  You may resume your normal medications unless your doctor tells you otherwise.  WHAT YOU CAN EXPECT TODAY  Some feelings of bloating in the abdomen.   Passage of more gas than usual.   Spotting of blood in your stool or on the toilet paper.  IF YOU HAD POLYPS REMOVED DURING THE COLONOSCOPY:  No aspirin products for 7 days or as instructed.   No alcohol for 7 days or as instructed.   Eat a soft diet for the next 24 hours.  FINDING OUT THE RESULTS OF YOUR TEST Not all test results are available during your visit. If your test results are not back during the visit, make an appointment  with your caregiver to find out the results. Do not assume everything is normal if you have not heard from your caregiver or the medical facility. It is important for you to follow up on all of your test results.  SEEK IMMEDIATE MEDICAL ATTENTION IF:  You have more than a spotting of blood in your stool.   Your belly is swollen (abdominal distention).   You are nauseated or vomiting.   You have a temperature over 101.   You have abdominal pain or discomfort that is severe or gets worse throughout the day.   EGD Discharge instructions Please read the instructions outlined below and refer to this sheet in the next few weeks. These discharge instructions provide you with general information on caring for yourself after you leave the hospital. Your doctor may also give you specific instructions. While your treatment has been planned according to the most current medical practices available, unavoidable complications occasionally occur. If you have any problems or questions after discharge, please call your doctor. ACTIVITY  You may resume your regular activity but move at a slower pace for the next 24 hours.   Take frequent rest periods for the next 24 hours.   Walking will help expel (get rid of) the air and reduce the bloated feeling in your abdomen.   No driving for 24 hours (because of the anesthesia (medicine) used during the test).   You may shower.   Do not sign any  important legal documents or operate any machinery for 24 hours (because of the anesthesia used during the test).  NUTRITION  Drink plenty of fluids.   You may resume your normal diet.   Begin with a light meal and progress to your normal diet.   Avoid alcoholic beverages for 24 hours or as instructed by your caregiver.  MEDICATIONS  You may resume your normal medications unless your caregiver tells you otherwise.  WHAT YOU CAN EXPECT TODAY  You may experience abdominal discomfort such as a feeling of  fullness or gas pains.  FOLLOW-UP  Your doctor will discuss the results of your test with you.  SEEK IMMEDIATE MEDICAL ATTENTION IF ANY OF THE FOLLOWING OCCUR:  Excessive nausea (feeling sick to your stomach) and/or vomiting.   Severe abdominal pain and distention (swelling).   Trouble swallowing.   Temperature over 101 F (37.8 C).   Rectal bleeding or vomiting of blood.    Diverticulosis information provided  Stomach inflamed-biopsies taken  No change in medications.  You would likely benefit from hemorrhoidal banding  Further recommendations to follow pending review of pathology report  Office visit for hemorrhoid banding in about one month from now     Diverticulosis Diverticulosis is the condition that develops when small pouches (diverticula) form in the wall of your colon. Your colon, or large intestine, is where water is absorbed and stool is formed. The pouches form when the inside layer of your colon pushes through weak spots in the outer layers of your colon. CAUSES  No one knows exactly what causes diverticulosis. RISK FACTORS  Being older than 99. Your risk for this condition increases with age. Diverticulosis is rare in people younger than 40 years. By age 77, almost everyone has it.  Eating a low-fiber diet.  Being frequently constipated.  Being overweight.  Not getting enough exercise.  Smoking.  Taking over-the-counter pain medicines, like aspirin and ibuprofen. SYMPTOMS  Most people with diverticulosis do not have symptoms. DIAGNOSIS  Because diverticulosis often has no symptoms, health care providers often discover the condition during an exam for other colon problems. In many cases, a health care provider will diagnose diverticulosis while using a flexible scope to examine the colon (colonoscopy). TREATMENT  If you have never developed an infection related to diverticulosis, you may not need treatment. If you have had an infection before,  treatment may include:  Eating more fruits, vegetables, and grains.  Taking a fiber supplement.  Taking a live bacteria supplement (probiotic).  Taking medicine to relax your colon. HOME CARE INSTRUCTIONS   Drink at least 6-8 glasses of water each day to prevent constipation.  Try not to strain when you have a bowel movement.  Keep all follow-up appointments. If you have had an infection before:  Increase the fiber in your diet as directed by your health care provider or dietitian.  Take a dietary fiber supplement if your health care provider approves.  Only take medicines as directed by your health care provider. SEEK MEDICAL CARE IF:   You have abdominal pain.  You have bloating.  You have cramps.  You have not gone to the bathroom in 3 days. SEEK IMMEDIATE MEDICAL CARE IF:   Your pain gets worse.  Yourbloating becomes very bad.  You have a fever or chills, and your symptoms suddenly get worse.  You begin vomiting.  You have bowel movements that are bloody or black. MAKE SURE YOU:  Understand these instructions.  Will watch your condition.  Will get help right away if you are not doing well or get worse.   This information is not intended to replace advice given to you by your health care provider. Make sure you discuss any questions you have with your health care provider.   Document Released: 05/26/2004 Document Revised: 09/03/2013 Document Reviewed: 07/24/2013 Elsevier Interactive Patient Education [employer].
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
## 2015-08-03 NOTE — Op Note (Signed)
[facility] [address] [place], [address]   COLONOSCOPY PROCEDURE REPORT  PATIENT: Andrea Jackson, Andrea Jackson  MR#: [id number] BIRTHDATE: [birth year] , 36  yrs. old GENDER: female ENDOSCOPIST: R.  Garfield Cornea, MD FACP [facility] REFERRED [id number] Quintin Alto, M.D. PROCEDURE DATE:  August 04, 2015 PROCEDURE:   Colonoscopy, diagnostic INDICATIONS:iron deficiency anemia. MEDICATIONS: Deep sedation per Dr.  [facility] ASA CLASS:       Class III  CONSENT: The risks, benefits, alternatives and imponderables including but not limited to bleeding, perforation as well as the possibility of a missed lesion have been reviewed.  The potential for biopsy, lesion removal, etc. have also been discussed. Questions have been answered.  All parties agreeable.  Please see the history and physical in the medical record for more information.  DESCRIPTION OF PROCEDURE:   After the risks benefits and alternatives of the procedure were thoroughly explained, informed consent was obtained.  The digital rectal exam revealed no abnormalities of the rectum.   The     endoscope was introduced through the anus and advanced to the terminal ileum which was intubated for a short distance. No adverse events experienced. The quality of the prep was     The instrument was then slowly withdrawn as the colon was fully examined. Estimated blood loss is zero unless otherwise noted in this procedure report.      COLON FINDINGS: Internal hemorrhoids; otherwise normal appearing rectal mucosa.  Capacious, redundant colon.  Scattered pancolonic diverticula; the remainder of the colonic mucosa appeared normal. The distal 10 cm of terminal ileal mucosa also appeared normal. Retroflexion was performed. .  Withdrawal time=11 minutes 0 seconds.  The scope was withdrawn and the procedure completed. COMPLICATIONS: There were no immediate complications.  ENDOSCOPIC IMPRESSION: Internal hemorrhoids. Redundant  capacious colon. Few scattered pancolonic diverticula.  No evidence of inflammatory bowel disease.  RECOMMENDATIONS: Patient requests  hemorrhoid banding.  We will follow-up in the office. See EGD report.  eSigned:  R. Garfield Cornea, MD Rosalita Chessman [facility] 04-Aug-2015 11:29 AM   cc:  CPT CODES: ICD CODES:  The ICD and CPT codes recommended by this software are interpretations from the data that the clinical staff has captured with the software.  The verification of the translation of this report to the ICD and CPT codes and modifiers is the sole responsibility of the health care institution and practicing physician where this report was generated.  [place]. will not be held responsible for the validity of the ICD and CPT codes included on this report.  AMA assumes no liability for data contained or not contained herein. CPT is a Designer, television/film set of the [employer].  PATIENT NAME:  Andrea Jackson, Andrea Jackson MR#: [id number]

## 2015-08-04 ENCOUNTER — Encounter (HOSPITAL_COMMUNITY): Payer: Self-pay | Admitting: Internal Medicine

## 2015-08-09 ENCOUNTER — Encounter: Payer: Self-pay | Admitting: Internal Medicine

## 2015-08-10 ENCOUNTER — Telehealth: Payer: Self-pay

## 2015-08-10 NOTE — Telephone Encounter (Signed)
Letter mailed to the pt. 

## 2015-08-10 NOTE — Telephone Encounter (Signed)
Per RMR- Send letter to patient.  Send copy of letter with path to referring provider and PCP.   Need ov w extender in coming weeks to see if capsule study needed / and ?assess for hem banding

## 2015-08-11 NOTE — Telephone Encounter (Signed)
Pt has OV with RMR on 12/20

## 2015-08-11 NOTE — Telephone Encounter (Signed)
PATIENT SCHEDULED FOR BANDING 12/20 WITH RMR

## 2015-08-17 ENCOUNTER — Emergency Department (HOSPITAL_COMMUNITY): Payer: Medicare Other

## 2015-08-17 ENCOUNTER — Encounter (HOSPITAL_COMMUNITY): Payer: Self-pay | Admitting: Emergency Medicine

## 2015-08-17 ENCOUNTER — Inpatient Hospital Stay (HOSPITAL_COMMUNITY)
Admission: EM | Admit: 2015-08-17 | Discharge: 2015-08-20 | DRG: 389 | Disposition: A | Discharge status: Home / Self Care | Payer: Medicare Other | Attending: Internal Medicine | Admitting: Internal Medicine

## 2015-08-17 ENCOUNTER — Telehealth: Payer: Self-pay | Admitting: Internal Medicine

## 2015-08-17 ENCOUNTER — Telehealth: Payer: Self-pay

## 2015-08-17 DIAGNOSIS — Z90710 Acquired absence of both cervix and uterus: Secondary | ICD-10-CM

## 2015-08-17 DIAGNOSIS — K56600 Partial intestinal obstruction, unspecified as to cause: Secondary | ICD-10-CM

## 2015-08-17 DIAGNOSIS — K565 Intestinal adhesions [bands] with obstruction (postprocedural) (postinfection): Principal | ICD-10-CM | POA: Diagnosis present

## 2015-08-17 DIAGNOSIS — I10 Essential (primary) hypertension: Secondary | ICD-10-CM | POA: Diagnosis not present

## 2015-08-17 DIAGNOSIS — I252 Old myocardial infarction: Secondary | ICD-10-CM | POA: Diagnosis not present

## 2015-08-17 DIAGNOSIS — E785 Hyperlipidemia, unspecified: Secondary | ICD-10-CM | POA: Diagnosis present

## 2015-08-17 DIAGNOSIS — K219 Gastro-esophageal reflux disease without esophagitis: Secondary | ICD-10-CM | POA: Diagnosis present

## 2015-08-17 DIAGNOSIS — Z800 Family history of malignant neoplasm of digestive organs: Secondary | ICD-10-CM

## 2015-08-17 DIAGNOSIS — N390 Urinary tract infection, site not specified: Secondary | ICD-10-CM | POA: Diagnosis present

## 2015-08-17 DIAGNOSIS — M1990 Unspecified osteoarthritis, unspecified site: Secondary | ICD-10-CM | POA: Diagnosis not present

## 2015-08-17 DIAGNOSIS — R1033 Periumbilical pain: Secondary | ICD-10-CM | POA: Diagnosis not present

## 2015-08-17 DIAGNOSIS — K5669 Other intestinal obstruction: Secondary | ICD-10-CM | POA: Diagnosis not present

## 2015-08-17 DIAGNOSIS — Z833 Family history of diabetes mellitus: Secondary | ICD-10-CM

## 2015-08-17 DIAGNOSIS — Z7982 Long term (current) use of aspirin: Secondary | ICD-10-CM | POA: Diagnosis not present

## 2015-08-17 DIAGNOSIS — F419 Anxiety disorder, unspecified: Secondary | ICD-10-CM | POA: Diagnosis present

## 2015-08-17 DIAGNOSIS — I2510 Atherosclerotic heart disease of native coronary artery without angina pectoris: Secondary | ICD-10-CM | POA: Diagnosis present

## 2015-08-17 DIAGNOSIS — R1013 Epigastric pain: Secondary | ICD-10-CM | POA: Diagnosis present

## 2015-08-17 DIAGNOSIS — E039 Hypothyroidism, unspecified: Secondary | ICD-10-CM | POA: Diagnosis present

## 2015-08-17 DIAGNOSIS — R1900 Intra-abdominal and pelvic swelling, mass and lump, unspecified site: Secondary | ICD-10-CM | POA: Diagnosis not present

## 2015-08-17 HISTORY — DX: Partial intestinal obstruction, unspecified as to cause: K56.600

## 2015-08-17 LAB — URINALYSIS, ROUTINE W REFLEX MICROSCOPIC
BILIRUBIN URINE: NEGATIVE
GLUCOSE, UA: NEGATIVE mg/dL
Ketones, ur: NEGATIVE mg/dL
Nitrite: NEGATIVE
PROTEIN: NEGATIVE mg/dL
Specific Gravity, Urine: 1.02 (ref 1.005–1.030)
pH: 6 (ref 5.0–8.0)

## 2015-08-17 LAB — CBC WITH DIFFERENTIAL/PLATELET
BASOS ABS: 0 10*3/uL (ref 0.0–0.1)
BASOS PCT: 0 %
EOS ABS: 0.2 10*3/uL (ref 0.0–0.7)
EOS PCT: 2 %
HCT: 43.1 % (ref 36.0–46.0)
Hemoglobin: 14 g/dL (ref 12.0–15.0)
Lymphocytes Relative: 17 %
Lymphs Abs: 1.7 10*3/uL (ref 0.7–4.0)
MCH: 29.4 pg (ref 26.0–34.0)
MCHC: 32.5 g/dL (ref 30.0–36.0)
MCV: 90.5 fL (ref 78.0–100.0)
Monocytes Absolute: 0.8 10*3/uL (ref 0.1–1.0)
Monocytes Relative: 8 %
Neutro Abs: 7.3 10*3/uL (ref 1.7–7.7)
Neutrophils Relative %: 73 %
PLATELETS: 404 10*3/uL — AB (ref 150–400)
RBC: 4.76 MIL/uL (ref 3.87–5.11)
RDW: 13.8 % (ref 11.5–15.5)
WBC: 10 10*3/uL (ref 4.0–10.5)

## 2015-08-17 LAB — COMPREHENSIVE METABOLIC PANEL
ALK PHOS: 57 U/L (ref 38–126)
ALT: 16 U/L (ref 14–54)
AST: 16 U/L (ref 15–41)
Albumin: 3.8 g/dL (ref 3.5–5.0)
Anion gap: 3 — ABNORMAL LOW (ref 5–15)
BILIRUBIN TOTAL: 0.4 mg/dL (ref 0.3–1.2)
BUN: 20 mg/dL (ref 6–20)
CO2: 33 mmol/L — ABNORMAL HIGH (ref 22–32)
CREATININE: 1.07 mg/dL — AB (ref 0.44–1.00)
Calcium: 9.1 mg/dL (ref 8.9–10.3)
Chloride: 106 mmol/L (ref 101–111)
GFR calc Af Amer: 56 mL/min — ABNORMAL LOW (ref >60)
GFR, EST NON AFRICAN AMERICAN: 48 mL/min — AB (ref >60)
Glucose, Bld: 118 mg/dL — ABNORMAL HIGH (ref 65–99)
Potassium: 3.9 mmol/L (ref 3.5–5.1)
SODIUM: 142 mmol/L (ref 135–145)
TOTAL PROTEIN: 6.9 g/dL (ref 6.5–8.1)

## 2015-08-17 LAB — URINE MICROSCOPIC-ADD ON

## 2015-08-17 LAB — LACTIC ACID, PLASMA
Lactic Acid, Venous: 0.7 mmol/L (ref 0.5–2.0)
Lactic Acid, Venous: 0.7 mmol/L (ref 0.5–2.0)

## 2015-08-17 LAB — LIPASE, BLOOD: LIPASE: 46 U/L (ref 11–51)

## 2015-08-17 MED ORDER — ENOXAPARIN SODIUM 40 MG/0.4ML ~~LOC~~ SOLN
40.0000 mg | SUBCUTANEOUS | Status: DC
  Administered 2015-08-18 – 2015-08-19 (×2): 40 mg via SUBCUTANEOUS
  Filled 2015-08-17 (×2): qty 0.4

## 2015-08-17 MED ORDER — MORPHINE SULFATE (PF) 4 MG/ML IV SOLN
4.0000 mg | INTRAVENOUS | Status: DC | PRN
  Administered 2015-08-18 – 2015-08-19 (×4): 4 mg via INTRAVENOUS
  Filled 2015-08-17 (×4): qty 1

## 2015-08-17 MED ORDER — HYDROMORPHONE HCL 1 MG/ML IJ SOLN
1.0000 mg | Freq: Once | INTRAMUSCULAR | Status: AC
  Administered 2015-08-17: 1 mg via INTRAVENOUS
  Filled 2015-08-17: qty 1

## 2015-08-17 MED ORDER — DEXTROSE 5 % IV SOLN
1.0000 g | INTRAVENOUS | Status: DC
  Administered 2015-08-18 – 2015-08-19 (×2): 1 g via INTRAVENOUS
  Filled 2015-08-17 (×3): qty 10

## 2015-08-17 MED ORDER — IOHEXOL 300 MG/ML  SOLN
100.0000 mL | Freq: Once | INTRAMUSCULAR | Status: AC | PRN
  Administered 2015-08-17: 100 mL via INTRAVENOUS

## 2015-08-17 MED ORDER — FENTANYL CITRATE (PF) 100 MCG/2ML IJ SOLN
100.0000 ug | Freq: Once | INTRAMUSCULAR | Status: AC
  Administered 2015-08-17: 100 ug via INTRAVENOUS
  Filled 2015-08-17: qty 2

## 2015-08-17 MED ORDER — NITROGLYCERIN 0.4 MG SL SUBL: 0.4000 mg | SUBLINGUAL_TABLET | SUBLINGUAL | Status: DC | PRN

## 2015-08-17 MED ORDER — SODIUM CHLORIDE 0.9 % IV BOLUS (SEPSIS)
1000.0000 mL | Freq: Once | INTRAVENOUS | Status: AC
  Administered 2015-08-17: 1000 mL via INTRAVENOUS

## 2015-08-17 MED ORDER — POTASSIUM CHLORIDE IN NACL 20-0.9 MEQ/L-% IV SOLN
INTRAVENOUS | Status: DC
  Administered 2015-08-18 – 2015-08-19 (×5): via INTRAVENOUS

## 2015-08-17 MED ORDER — DIATRIZOATE MEGLUMINE & SODIUM 66-10 % PO SOLN
ORAL | Status: AC
  Administered 2015-08-17: 21:00:00
  Filled 2015-08-17: qty 30

## 2015-08-17 MED ORDER — ONDANSETRON HCL 4 MG/2ML IJ SOLN: 4.0000 mg | Freq: Four times a day (QID) | INTRAMUSCULAR | Status: DC | PRN

## 2015-08-17 MED ORDER — ONDANSETRON HCL 4 MG PO TABS
4.0000 mg | ORAL_TABLET | Freq: Four times a day (QID) | ORAL | Status: DC | PRN
Start: 2015-08-17 — End: 2015-08-20

## 2015-08-17 MED ORDER — CYCLOSPORINE 0.05 % OP EMUL
1.0000 [drp] | Freq: Every evening | OPHTHALMIC | Status: DC
  Administered 2015-08-18 – 2015-08-19 (×2): 1 [drp] via OPHTHALMIC
  Filled 2015-08-17 (×3): qty 1

## 2015-08-17 MED ORDER — HYDRALAZINE HCL 20 MG/ML IJ SOLN
5.0000 mg | INTRAMUSCULAR | Status: DC | PRN
  Filled 2015-08-17: qty 1

## 2015-08-17 MED ORDER — MORPHINE SULFATE (PF) 4 MG/ML IV SOLN
4.0000 mg | Freq: Once | INTRAVENOUS | Status: AC
  Administered 2015-08-17: 4 mg via INTRAVENOUS
  Filled 2015-08-17: qty 1

## 2015-08-17 MED ORDER — DEXTROSE 5 % IV SOLN
1.0000 g | Freq: Once | INTRAVENOUS | Status: AC
  Administered 2015-08-17: 1 g via INTRAVENOUS
  Filled 2015-08-17: qty 10
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
## 2015-08-17 NOTE — Telephone Encounter (Signed)
Pt called and states she is having peri-umbilical pain. States she is has tried everything and nothing helps. States she has been cramping since her procedure in November. States tramadol isn't working. Nothing helps and she has to lie down to get the pain to ease.

## 2015-08-17 NOTE — Telephone Encounter (Signed)
Patient had called earlier this morning and spoke with CJ. Pt called back this afternoon to see if the provider had any recommendations because she is hurting in the abdomin area with no relief. I advised if it got unbearable to go to the ED and I would send another phone note to the provider. Please advise and call [phone]

## 2015-08-17 NOTE — ED Notes (Signed)
Pt states that she had Colonoscopy 11/22 ,and then few days after that the abdo pain started - Since then pain has improved then worstened and last couple of days pain has just got worst   had some nausea due to pain , Denies diarrhea or probs with urination-

## 2015-08-18 ENCOUNTER — Encounter (HOSPITAL_COMMUNITY): Payer: Self-pay | Admitting: *Deleted

## 2015-08-18 DIAGNOSIS — E039 Hypothyroidism, unspecified: Secondary | ICD-10-CM

## 2015-08-18 DIAGNOSIS — K219 Gastro-esophageal reflux disease without esophagitis: Secondary | ICD-10-CM

## 2015-08-18 LAB — COMPREHENSIVE METABOLIC PANEL
ALBUMIN: 2.8 g/dL — AB (ref 3.5–5.0)
ALT: 13 U/L — AB (ref 14–54)
ANION GAP: 4 — AB (ref 5–15)
AST: 12 U/L — ABNORMAL LOW (ref 15–41)
Alkaline Phosphatase: 47 U/L (ref 38–126)
BUN: 13 mg/dL (ref 6–20)
CHLORIDE: 110 mmol/L (ref 101–111)
CO2: 29 mmol/L (ref 22–32)
CREATININE: 0.85 mg/dL (ref 0.44–1.00)
Calcium: 8.4 mg/dL — ABNORMAL LOW (ref 8.9–10.3)
GFR calc non Af Amer: >60 mL/min (ref >60)
Glucose, Bld: 95 mg/dL (ref 65–99)
Potassium: 4.1 mmol/L (ref 3.5–5.1)
SODIUM: 143 mmol/L (ref 135–145)
Total Bilirubin: 0.5 mg/dL (ref 0.3–1.2)
Total Protein: 5.1 g/dL — ABNORMAL LOW (ref 6.5–8.1)

## 2015-08-18 LAB — CBC
HCT: 36.4 % (ref 36.0–46.0)
HEMOGLOBIN: 11.6 g/dL — AB (ref 12.0–15.0)
MCH: 28.9 pg (ref 26.0–34.0)
MCHC: 31.9 g/dL (ref 30.0–36.0)
MCV: 90.5 fL (ref 78.0–100.0)
Platelets: 310 10*3/uL (ref 150–400)
RBC: 4.02 MIL/uL (ref 3.87–5.11)
RDW: 13.9 % (ref 11.5–15.5)
WBC: 5.9 10*3/uL (ref 4.0–10.5)

## 2015-08-18 MED ORDER — PANTOPRAZOLE SODIUM 40 MG IV SOLR
40.0000 mg | INTRAVENOUS | Status: DC
  Administered 2015-08-18: 40 mg via INTRAVENOUS
  Filled 2015-08-18: qty 40

## 2015-08-18 MED ORDER — LEVOTHYROXINE SODIUM 100 MCG IV SOLR
68.5000 ug | Freq: Every day | INTRAVENOUS | Status: DC
  Administered 2015-08-18: 68.5 ug via INTRAVENOUS
  Filled 2015-08-18 (×2): qty 5

## 2015-08-18 NOTE — Progress Notes (Signed)
[facility] PROGRESS NOTE  Andrea Jackson [id number] DOB: 10/28/1933 DOA: 08/17/2015 PCP: Gar Ponto, MD Summary  81 yo woman with PMH of multiple abdominal surgeries and colonoscopy approximately 2 weeks. Presented with complaints of abd pain/discomfort and abd swelling. Partial SBO revealed on CT A/P. General surgery has evaluated and does not believe additional surgical intervention is needed at this time. Advance diet as tolerated and anticipate discharge within the next 1-2 days.   Assessment/Plan: 1. Partial small bowel obstruction, revealed on CT A/P. Patient is currently on bowel rest, IVF, and antiemetics PRN. General surgery has evaluated does not believe additional surgical intervention is needed at this time. She is currently passing flatus although she has not had a bowel movement as of yet. We are hopeful that her bowel obstruction will resolve   with conservative management. Advance diet as tolerated. 2. UTI. Started on rocephin upon admission. Follow up UC.  3. Essential HTN. Stable. IV Hydralazine PRN.  4. Hypothyroidism. Will continue on IV synthroid for now until bowel obstruction is resolved.   5. GERD. Continue PPI. 6. Anxiety. Currently appears stable. Can resume Xanax as outpatient.   Code Status: Full DVT prophylaxis Lovenox  Family Communication: Discussed with patient who understands and has no concerns at this time. Disposition Plan: Anticipate discharge within 1-2 days   Consultants:  General surgery   Procedures:    Antibiotics:  Rocephin 12/05>>  HPI/Subjective: Still sore with mild nausea. Denies vomiting. Has had some flatulence. No bowel movement. This pain is similar to past.  Objective: Filed Vitals:   08/18/15 0440 08/18/15 0511  BP: 146/90 129/69  Pulse:  66  Temp:  98 F (36.7 C)  Resp:  20   No intake or output data in the 24 hours ending 08/18/15 0700 Filed Weights   08/17/15 1625 08/18/15 0044  Weight: 56.7 kg (125  lb) 56.7 kg (125 lb)    Exam: General: NAD. Appear calm and looks comfortable Cardiovascular: RRR, S1, S2  Respiratory: clear bilaterally, No wheezing, rales or rhonchi Abdomen: soft, mild diffused tenderness, no distention , bowel sounds normal Musculoskeletal: No edema b/l  Data Reviewed: Basic Metabolic Panel:  Recent Labs Lab 08/17/15 1644  NA 142  K 3.9  CL 106  CO2 33*  GLUCOSE 118*  BUN 20  CREATININE 1.07*  CALCIUM 9.1   Liver Function Tests:  Recent Labs Lab 08/17/15 1644  AST 16  ALT 16  ALKPHOS 57  BILITOT 0.4  PROT 6.9  ALBUMIN 3.8    Recent Labs Lab 08/17/15 1644  LIPASE 46   No results for input(s): AMMONIA in the last 168 hours. CBC:  Recent Labs Lab 08/17/15 1644  WBC 10.0  NEUTROABS 7.3  HGB 14.0  HCT 43.1  MCV 90.5  PLT 404*     Studies: Ct Abdomen Pelvis W Contrast  08/17/2015  CLINICAL DATA:  Periumbilical pain and cramping since November 22nd. EXAM: CT ABDOMEN AND PELVIS WITH CONTRAST TECHNIQUE: Multidetector CT imaging of the abdomen and pelvis was performed using the standard protocol following bolus administration of intravenous contrast. CONTRAST:  174mL OMNIPAQUE IOHEXOL 300 MG/ML  SOLN COMPARISON:  CT of the abdomen and pelvis 11/21/2012 FINDINGS: Lower chest:  No acute findings. Hepatobiliary: There is a stable hypoattenuated lesion in the anterior segment of the right hepatic lobe, measuring 14 mm, previously determined to represent a hemangioma. Patient is status postcholecystectomy. Pancreas: No mass, inflammatory changes, or other significant abnormality. Spleen: Within normal limits in size and appearance.  Adrenals/Urinary Tract: No masses identified. No evidence of hydronephrosis. There is a stable 2.2 cm left renal cyst. Stomach/Bowel: There has been a prior small bowel resection, with end-to-side anastomosis in the mid abdomen. There is a diffuse borderline pathologic small bowel distention with multiple fluid-filled  small bowels, as well as signs of fecalization of the small bowel contents, proximal to the anastomosis, starting in the level of the mid to distal jejunum. Mild inflammatory changes of the bowel wall are also seen. There is a transitional point at the anastomosis, distal to which the small bowel returns to normal caliber, image 57/87, axial images. The maximal small bowel diameter is 3.5 cm. The stomach is distended. The duodenum has normal caliber. The colon is decompressed and has normal appearance. There is no evidence of free gas in the abdomen. Vascular/Lymphatic: No pathologically enlarged lymph nodes. No evidence of abdominal aortic aneurysm. The abdominal aorta is very torturous. Atherosclerotic disease of the abdominal aorta is also noted. Reproductive: No mass or other significant abnormality. Patient is status post hysterectomy. Other: None. Musculoskeletal: No suspicious bone lesions identified. Prominent osteoarthritic changes are seen at L2-L3 and L3-L4 with straightening of the lumbosacral lordosis. IMPRESSION: Status post small bowel resection. Early or incomplete small bowel obstruction with transitional point at the anastomosis. Normal appearance of the colon. Stable appearance of the solid abdominal organs. Tortuosity and atherosclerotic disease of the aorta. These results were called by telephone at the time of interpretation on 08/17/2015 at 8:38 pm to Dr. Merrily Pew , who verbally acknowledged these results. Electronically Signed   By: Fidela Salisbury M.D.   On: 08/17/2015 20:39    Scheduled Meds: . cefTRIAXone (ROCEPHIN)  IV  1 g Intravenous Q24H  . cycloSPORINE  1 drop Both Eyes QPM  . enoxaparin (LOVENOX) injection  40 mg Subcutaneous Q24H   Continuous Infusions: . 0.9 % NaCl with KCl 20 mEq / L 75 mL/hr at 08/18/15 [id number]    Active Problems:   Essential hypertension, benign   Abdominal pain, epigastric   Partial small bowel obstruction ([place])    Time spent:15 minutes      Andrea Dike, MD  [facility] Pager [phone]. If 7PM-7AM, please contact night-coverage at [email or web address], password [facility] 08/18/2015, 7:00 AM  LOS: 1 day     By signing my name below, I, Andrea Jackson, attest that this documentation has been prepared under the direction and in the presence of Andrea Dike, MD. Electronically signed: Rennis Jackson, Scribe. 08/18/2015 12:02pm   I, Dr. Kathie Jackson, personally performed the services described in this documentaiton. All medical record entries made by the scribe were at my direction and in my presence. I have reviewed the chart and agree that the record reflects my personal performance and is accurate and complete  Andrea Dike, MD, 08/18/2015 12:08 PM

## 2015-08-18 NOTE — Care Management Note (Signed)
Case Management Note  Patient Details  Name: Andrea Jackson MRN: [id number] Date of Birth: 1933-10-06  Subjective/Objective:                  Pt admitted from home with small bowel obs. Pt lives alone and will return home at discharge. Pt is independent with ADL's.  Action/Plan: No CM needs noted.  Expected Discharge Date:                  Expected Discharge Plan:  Home/Self Care  In-House Referral:  NA  Discharge planning Services  CM Consult  Post Acute Care Choice:  NA Choice offered to:  NA  DME Arranged:    DME Agency:     HH Arranged:    HH Agency:     Status of Service:  Completed, signed off  Medicare Important Message Given:    Date Medicare IM Given:    Medicare IM give by:    Date Additional Medicare IM Given:    Additional Medicare Important Message give by:     If discussed at [place] of Stay Meetings, dates discussed:    Additional Comments:  Joylene Draft, RN 08/18/2015, 3:24 PM

## 2015-08-18 NOTE — Telephone Encounter (Signed)
Follow-up note: patient was recommended to proceed to the ER and was admitted with a partial bowel obstruction.
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
## 2015-08-18 NOTE — Telephone Encounter (Signed)
Agreed, no further recommendations 

## 2015-08-19 DIAGNOSIS — K5669 Other intestinal obstruction: Secondary | ICD-10-CM

## 2015-08-19 DIAGNOSIS — I10 Essential (primary) hypertension: Secondary | ICD-10-CM

## 2015-08-19 LAB — CBC
HEMATOCRIT: 36.3 % (ref 36.0–46.0)
Hemoglobin: 11.2 g/dL — ABNORMAL LOW (ref 12.0–15.0)
MCH: 28.1 pg (ref 26.0–34.0)
MCHC: 30.9 g/dL (ref 30.0–36.0)
MCV: 91.2 fL (ref 78.0–100.0)
Platelets: 278 10*3/uL (ref 150–400)
RBC: 3.98 MIL/uL (ref 3.87–5.11)
RDW: 14 % (ref 11.5–15.5)
WBC: 4.2 10*3/uL (ref 4.0–10.5)

## 2015-08-19 LAB — BASIC METABOLIC PANEL
BUN: 8 mg/dL (ref 6–20)
CALCIUM: 8.4 mg/dL — AB (ref 8.9–10.3)
CO2: 28 mmol/L (ref 22–32)
CREATININE: 0.8 mg/dL (ref 0.44–1.00)
Chloride: 111 mmol/L (ref 101–111)
GFR calc Af Amer: >60 mL/min (ref >60)
GLUCOSE: 90 mg/dL (ref 65–99)
Potassium: 4.2 mmol/L (ref 3.5–5.1)
Sodium: 141 mmol/L (ref 135–145)

## 2015-08-19 MED ORDER — LEVOTHYROXINE SODIUM 25 MCG PO TABS
137.0000 ug | ORAL_TABLET | Freq: Every day | ORAL | Status: DC
  Administered 2015-08-19 – 2015-08-20 (×2): 137 ug via ORAL
  Filled 2015-08-19 (×2): qty 1

## 2015-08-19 MED ORDER — ISOSORBIDE MONONITRATE ER 30 MG PO TB24
15.0000 mg | ORAL_TABLET | Freq: Every day | ORAL | Status: DC
  Administered 2015-08-19 – 2015-08-20 (×2): 15 mg via ORAL
  Filled 2015-08-19 (×2): qty 1

## 2015-08-19 MED ORDER — ALPRAZOLAM 0.25 MG PO TABS: 0.2500 mg | ORAL_TABLET | Freq: Every evening | ORAL | Status: DC | PRN

## 2015-08-19 MED ORDER — PANTOPRAZOLE SODIUM 40 MG PO TBEC
40.0000 mg | DELAYED_RELEASE_TABLET | Freq: Every day | ORAL | Status: DC
  Administered 2015-08-19 – 2015-08-20 (×2): 40 mg via ORAL
  Filled 2015-08-19 (×2): qty 1

## 2015-08-19 MED ORDER — LISINOPRIL 10 MG PO TABS
40.0000 mg | ORAL_TABLET | Freq: Every day | ORAL | Status: DC
  Administered 2015-08-19 – 2015-08-20 (×2): 40 mg via ORAL
  Filled 2015-08-19 (×2): qty 4

## 2015-08-19 MED ORDER — ALPRAZOLAM 0.25 MG PO TABS: 0.2500 mg | ORAL_TABLET | Freq: Every day | ORAL | Status: DC | PRN

## 2015-08-19 MED ORDER — ALPRAZOLAM 0.25 MG PO TABS
0.2500 mg | ORAL_TABLET | Freq: Every evening | ORAL | Status: DC | PRN
  Administered 2015-08-19: 0.25 mg via ORAL
  Filled 2015-08-19: qty 1

## 2015-08-19 NOTE — Telephone Encounter (Signed)
noted 

## 2015-08-19 NOTE — Progress Notes (Addendum)
Patient Demographics  Andrea Jackson, is a 81 y.o. female, DOB - [birth year], [id number]  Admit date - 08/17/2015   Admitting Physician Doree Albee, MD  Outpatient Primary MD for the patient is Gar Ponto, MD  LOS - 2   Chief Complaint  Patient presents with  . Abdominal Pain       Admission HPI/Brief narrative: 81 yo woman with PMH of multiple abdominal surgeries and colonoscopy approximately 2 weeks. Presented with complaints of abd pain/discomfort and abd swelling. Partial SBO revealed on CT A/P. General surgery has evaluated and does not believe additional surgical intervention is needed at this time. Advance diet as tolerated .  Subjective:   Burnard Bunting today has, No headache, No chest pain, No new weakness tingling or numbness, No Cough - SOB. Patient was able to have multiple bowel movements, passing flatus over last 24 hours, tolerating clear liquid diet, still complaining of some cramping abdominal pain, it's improving.  Assessment & Plan    Active Problems:   Essential hypertension, benign   Abdominal pain, epigastric   Partial small bowel obstruction (HCC)  Partial small bowel obstruction  - CT abdomen and pelvis with evidence of incomplete small bowel obstruction with transition point at the anastomosis ,  history of recurrent small bowel obstruction in the past, resolved with conservative management . - Surgical input greatly appreciated, advance diet as tolerated , patient passing flatus, with good bowel movement overnight, advanced to full liquid diet .  UTI  - Treated with Rocephin, urine culture with no growth ,  Hypertension  -  continue with when necessary hydralazine , started to increase resume on Imdur and lisinopril  Hypothyroidism  - Treated with Synthroid   GERD  - Continue with PPI   Anxiety  - Continue with home medication   Code Status: Full    Family Communication: discussed with patient   Disposition Plan: home when stable    Procedures  None    Consults   Gen. surgery    Medications  Scheduled Meds: . cefTRIAXone (ROCEPHIN)  IV  1 g Intravenous Q24H  . cycloSPORINE  1 drop Both Eyes QPM  . enoxaparin (LOVENOX) injection  40 mg Subcutaneous Q24H  . levothyroxine  137 mcg Oral QAC breakfast  . pantoprazole  40 mg Oral Daily   Continuous Infusions: . 0.9 % NaCl with KCl 20 mEq / L 75 mL/hr at 08/19/15 0456   PRN Meds:.hydrALAZINE, morphine injection, nitroGLYCERIN, ondansetron **OR** ondansetron (ZOFRAN) IV  DVT Prophylaxis  Lovenox   Lab Results  Component Value Date   PLT 278 08/19/2015    Antibiotics    Anti-infectives    Start     Dose/Rate Route Frequency Ordered Stop   08/17/15 2145  cefTRIAXone (ROCEPHIN) 1 g in dextrose 5 % 50 mL IVPB     1 g 100 mL/hr over 30 Minutes Intravenous Every 24 hours 08/17/15 2137     08/17/15 1930  cefTRIAXone (ROCEPHIN) 1 g in dextrose 5 % 50 mL IVPB     1 g 100 mL/hr over 30 Minutes Intravenous  Once 08/17/15 1923 08/17/15 2005          Objective:   Filed Vitals:   08/18/15 0511 08/18/15 1357 08/18/15  2052 08/19/15 0547  BP: 129/69 159/69 149/71 160/80  Pulse: 66 65 70 63  Temp: 98 F (36.7 C) 98.5 F (36.9 C) 98.4 F (36.9 C) 98.1 F (36.7 C)  TempSrc: Oral Oral Oral Oral  Resp: 20 20 20 20   Height:      Weight:      SpO2: 93% 97% 91% 95%    Wt Readings from Last 3 Encounters:  08/18/15 56.7 kg (125 lb)  08/03/15 58.06 kg (128 lb)  07/30/15 58.06 kg (128 lb)     Intake/Output Summary (Last 24 hours) at 08/19/15 1232 Last data filed at 08/19/15 0900  Gross per 24 hour  Intake   1250 ml  Output    200 ml  Net   1050 ml     Physical Exam  Awake Alert, Oriented X 3, No new F.N deficits, Normal affect [place].AT,PERRAL Supple Neck,No JVD, No cervical lymphadenopathy appriciated.  Symmetrical Chest wall movement, Good air movement  bilaterally, CTAB RRR,No Gallops,Rubs or new Murmurs, No Parasternal Heave bowel sounds present s, Abd Soft, non distended, mild tenderness to palpation, , No organomegaly appriciated, No rebound - guarding or rigidity. No Cyanosis, Clubbing or edema, No new Rash or bruise    Data Review   Micro Results Recent Results (from the past 240 hour(s))  Culture, Urine     Status: None (Preliminary result)   Collection Time: 08/18/15  1:37 PM  Result Value Ref Range Status   Specimen Description URINE, CLEAN CATCH  Final   Special Requests NONE  Final   Culture   Final    NO GROWTH < 24 HOURS Performed at [facility]    Report Status PENDING  Incomplete    Radiology Reports Ct Abdomen Pelvis W Contrast  08/17/2015  CLINICAL DATA:  Periumbilical pain and cramping since November 22nd. EXAM: CT ABDOMEN AND PELVIS WITH CONTRAST TECHNIQUE: Multidetector CT imaging of the abdomen and pelvis was performed using the standard protocol following bolus administration of intravenous contrast. CONTRAST:  129mL OMNIPAQUE IOHEXOL 300 MG/ML  SOLN COMPARISON:  CT of the abdomen and pelvis 11/21/2012 FINDINGS: Lower chest:  No acute findings. Hepatobiliary: There is a stable hypoattenuated lesion in the anterior segment of the right hepatic lobe, measuring 14 mm, previously determined to represent a hemangioma. Patient is status postcholecystectomy. Pancreas: No mass, inflammatory changes, or other significant abnormality. Spleen: Within normal limits in size and appearance. Adrenals/Urinary Tract: No masses identified. No evidence of hydronephrosis. There is a stable 2.2 cm left renal cyst. Stomach/Bowel: There has been a prior small bowel resection, with end-to-side anastomosis in the mid abdomen. There is a diffuse borderline pathologic small bowel distention with multiple fluid-filled small bowels, as well as signs of fecalization of the small bowel contents, proximal to the anastomosis, starting in the  level of the mid to distal jejunum. Mild inflammatory changes of the bowel wall are also seen. There is a transitional point at the anastomosis, distal to which the small bowel returns to normal caliber, image 57/87, axial images. The maximal small bowel diameter is 3.5 cm. The stomach is distended. The duodenum has normal caliber. The colon is decompressed and has normal appearance. There is no evidence of free gas in the abdomen. Vascular/Lymphatic: No pathologically enlarged lymph nodes. No evidence of abdominal aortic aneurysm. The abdominal aorta is very torturous. Atherosclerotic disease of the abdominal aorta is also noted. Reproductive: No mass or other significant abnormality. Patient is status post hysterectomy. Other: None. Musculoskeletal: No suspicious  bone lesions identified. Prominent osteoarthritic changes are seen at L2-L3 and L3-L4 with straightening of the lumbosacral lordosis. IMPRESSION: Status post small bowel resection. Early or incomplete small bowel obstruction with transitional point at the anastomosis. Normal appearance of the colon. Stable appearance of the solid abdominal organs. Tortuosity and atherosclerotic disease of the aorta. These results were called by telephone at the time of interpretation on 08/17/2015 at 8:38 pm to Dr. Merrily Pew , who verbally acknowledged these results. Electronically Signed   By: Fidela Salisbury M.D.   On: 08/17/2015 20:39     CBC  Recent Labs Lab 08/17/15 1644 08/18/15 0707 08/19/15 0622  WBC 10.0 5.9 4.2  HGB 14.0 11.6* 11.2*  HCT 43.1 36.4 36.3  PLT 404* 310 278  MCV 90.5 90.5 91.2  MCH 29.4 28.9 28.1  MCHC 32.5 31.9 30.9  RDW 13.8 13.9 14.0  LYMPHSABS 1.7  --   --   MONOABS 0.8  --   --   EOSABS 0.2  --   --   BASOSABS 0.0  --   --     Chemistries   Recent Labs Lab 08/17/15 1644 08/18/15 0707 08/19/15 0622  NA 142 143 141  K 3.9 4.1 4.2  CL 106 110 111  CO2 33* 29 28  GLUCOSE 118* 95 90  BUN 20 13 8   CREATININE  1.07* 0.85 0.80  CALCIUM 9.1 8.4* 8.4*  AST 16 12*  --   ALT 16 13*  --   ALKPHOS 57 47  --   BILITOT 0.4 0.5  --    ------------------------------------------------------------------------------------------------------------------ estimated creatinine clearance is 47.2 mL/min (by C-G formula based on Cr of 0.8). ------------------------------------------------------------------------------------------------------------------ No results for input(s): HGBA1C in the last 72 hours. ------------------------------------------------------------------------------------------------------------------ No results for input(s): CHOL, HDL, LDLCALC, TRIG, CHOLHDL, LDLDIRECT in the last 72 hours. ------------------------------------------------------------------------------------------------------------------ No results for input(s): TSH, T4TOTAL, T3FREE, THYROIDAB in the last 72 hours.  Invalid input(s): FREET3 ------------------------------------------------------------------------------------------------------------------ No results for input(s): VITAMINB12, FOLATE, FERRITIN, TIBC, IRON, RETICCTPCT in the last 72 hours.  Coagulation profile No results for input(s): INR, PROTIME in the last 168 hours.  No results for input(s): DDIMER in the last 72 hours.  Cardiac Enzymes No results for input(s): CKMB, TROPONINI, MYOGLOBIN in the last 168 hours.  Invalid input(s): CK ------------------------------------------------------------------------------------------------------------------ Invalid input(s): POCBNP     Time Spent in minutes   25 minutes   Makensey Rego M.D on 08/19/2015 at 12:32 PM  Between 7am to 7pm - Pager - [phone]  After 7pm go to [email or web address] - password [facility]  [facility]   Office  [phone]

## 2015-08-19 NOTE — Progress Notes (Signed)
Subjective: Patient has had multiple bowel movements and passing flatus. Seems to be tolerating clear liquid diet well. She does have some crampy abdominal pain. She states she is better than yesterday.  Objective: Vital signs in last 24 hours: Temp:  [98.1 F (36.7 C)-98.5 F (36.9 C)] 98.1 F (36.7 C) (12/07 0547) Pulse Rate:  [63-70] 63 (12/07 0547) Resp:  [20] 20 (12/07 0547) BP: (149-160)/(69-80) 160/80 mmHg (12/07 0547) SpO2:  [91 %-97 %] 95 % (12/07 0547) Last BM Date: 08/18/15  Intake/Output from previous day: 12/06 0701 - 12/07 0700 In: 1571.3 [P.O.:290; I.V.:1281.3] Out: 200 [Urine:200] Intake/Output this shift: Total I/O In: 360 [P.O.:360] Out: -   General appearance: alert, cooperative and no distress GI: Soft. Occasional bowel sounds appreciated. Not particularly distended. No rigidity noted. Migratory discomfort to palpation noted.  Lab Results:   Recent Labs  08/18/15 0707 08/19/15 0622  WBC 5.9 4.2  HGB 11.6* 11.2*  HCT 36.4 36.3  PLT 310 278   BMET  Recent Labs  08/18/15 0707 08/19/15 0622  NA 143 141  K 4.1 4.2  CL 110 111  CO2 29 28  GLUCOSE 95 90  BUN 13 8  CREATININE 0.85 0.80  CALCIUM 8.4* 8.4*   PT/INR No results for input(s): LABPROT, INR in the last 72 hours.  Studies/Results: Ct Abdomen Pelvis W Contrast  08/17/2015  CLINICAL DATA:  Periumbilical pain and cramping since November 22nd. EXAM: CT ABDOMEN AND PELVIS WITH CONTRAST TECHNIQUE: Multidetector CT imaging of the abdomen and pelvis was performed using the standard protocol following bolus administration of intravenous contrast. CONTRAST:  173mL OMNIPAQUE IOHEXOL 300 MG/ML  SOLN COMPARISON:  CT of the abdomen and pelvis 11/21/2012 FINDINGS: Lower chest:  No acute findings. Hepatobiliary: There is a stable hypoattenuated lesion in the anterior segment of the right hepatic lobe, measuring 14 mm, previously determined to represent a hemangioma. Patient is status  postcholecystectomy. Pancreas: No mass, inflammatory changes, or other significant abnormality. Spleen: Within normal limits in size and appearance. Adrenals/Urinary Tract: No masses identified. No evidence of hydronephrosis. There is a stable 2.2 cm left renal cyst. Stomach/Bowel: There has been a prior small bowel resection, with end-to-side anastomosis in the mid abdomen. There is a diffuse borderline pathologic small bowel distention with multiple fluid-filled small bowels, as well as signs of fecalization of the small bowel contents, proximal to the anastomosis, starting in the level of the mid to distal jejunum. Mild inflammatory changes of the bowel wall are also seen. There is a transitional point at the anastomosis, distal to which the small bowel returns to normal caliber, image 57/87, axial images. The maximal small bowel diameter is 3.5 cm. The stomach is distended. The duodenum has normal caliber. The colon is decompressed and has normal appearance. There is no evidence of free gas in the abdomen. Vascular/Lymphatic: No pathologically enlarged lymph nodes. No evidence of abdominal aortic aneurysm. The abdominal aorta is very torturous. Atherosclerotic disease of the abdominal aorta is also noted. Reproductive: No mass or other significant abnormality. Patient is status post hysterectomy. Other: None. Musculoskeletal: No suspicious bone lesions identified. Prominent osteoarthritic changes are seen at L2-L3 and L3-L4 with straightening of the lumbosacral lordosis. IMPRESSION: Status post small bowel resection. Early or incomplete small bowel obstruction with transitional point at the anastomosis. Normal appearance of the colon. Stable appearance of the solid abdominal organs. Tortuosity and atherosclerotic disease of the aorta. These results were called by telephone at the time of interpretation on 08/17/2015 at 8:38 pm  to Dr. Merrily Pew , who verbally acknowledged these results. Electronically Signed    By: Fidela Salisbury M.D.   On: 08/17/2015 20:39    Anti-infectives: Anti-infectives    Start     Dose/Rate Route Frequency Ordered Stop   08/17/15 2145  cefTRIAXone (ROCEPHIN) 1 g in dextrose 5 % 50 mL IVPB     1 g 100 mL/hr over 30 Minutes Intravenous Every 24 hours 08/17/15 2137     08/17/15 1930  cefTRIAXone (ROCEPHIN) 1 g in dextrose 5 % 50 mL IVPB     1 g 100 mL/hr over 30 Minutes Intravenous  Once 08/17/15 1923 08/17/15 2005      Assessment/Plan: Impression: Partial small bowel obstruction, resolving Plan:  No need for surgical intervention. We'll advance to full liquid diet. Will get patient ambulating.  LOS: 2 days    Mounir Skipper A 08/19/2015

## 2015-08-20 LAB — BASIC METABOLIC PANEL
Anion gap: 3 — ABNORMAL LOW (ref 5–15)
BUN: 6 mg/dL (ref 6–20)
CHLORIDE: 112 mmol/L — AB (ref 101–111)
CO2: 26 mmol/L (ref 22–32)
CREATININE: 0.75 mg/dL (ref 0.44–1.00)
Calcium: 8.6 mg/dL — ABNORMAL LOW (ref 8.9–10.3)
Glucose, Bld: 87 mg/dL (ref 65–99)
Potassium: 4.1 mmol/L (ref 3.5–5.1)
SODIUM: 141 mmol/L (ref 135–145)

## 2015-08-20 LAB — URINE CULTURE: CULTURE: NO GROWTH

## 2015-08-20 NOTE — Care Management Note (Signed)
Case Management Note  Patient Details  Name: CHANTASIA BROGE MRN: [id number] Date of Birth: [birth year]  Subjective/Objective:                    Action/Plan:   Expected Discharge Date:                  Expected Discharge Plan:  Home/Self Care  In-House Referral:  NA  Discharge planning Services  CM Consult  Post Acute Care Choice:  NA Choice offered to:  NA  DME Arranged:    DME Agency:     HH Arranged:    [place] Agency:     Status of Service:  Completed, signed off  Medicare Important Message Given:  Yes Date Medicare IM Given:    Medicare IM give by:    Date Additional Medicare IM Given:    Additional Medicare Important Message give by:     If discussed at [place] of Stay Meetings, dates discussed:    Additional Comments: Pt discharged home today. No CM needs noted. Christinia Gully [place], RN 08/20/2015, 2:20 PM

## 2015-08-20 NOTE — Care Management Important Message (Signed)
Important Message  Patient Details  Name: Andrea Jackson MRN: [id number] Date of Birth: Sep 05, 1934   Medicare Important Message Given:  Yes    Joylene Draft, RN 08/20/2015, 2:20 PM
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
## 2015-08-20 NOTE — Discharge Instructions (Signed)
Follow with Primary MD Gar Ponto, MD in 7 days   Get CBC, CMP, checked  by Primary MD next visit.    Activity: As tolerated with Full fall precautions use walker/cane & assistance as needed   Disposition Home    Diet: Heart Healthy , soft diet, with feeding assistance and aspiration precautions.  For Heart failure patients - Check your Weight same time everyday, if you gain over 2 pounds, or you develop in leg swelling, experience more shortness of breath or chest pain, call your Primary MD immediately. Follow Cardiac Low Salt Diet and 1.5 lit/day fluid restriction.   On your next visit with your primary care physician please Get Medicines reviewed and adjusted.   Please request your Prim.MD to go over all Hospital Tests and Procedure/Radiological results at the follow up, please get all Hospital records sent to your Prim MD by signing hospital release before you go home.   If you experience worsening of your admission symptoms, develop shortness of breath, life threatening emergency, suicidal or homicidal thoughts you must seek medical attention immediately by calling 911 or calling your MD immediately  if symptoms less severe.  You Must read complete instructions/literature along with all the possible adverse reactions/side effects for all the Medicines you take and that have been prescribed to you. Take any new Medicines after you have completely understood and accpet all the possible adverse reactions/side effects.   Do not drive, operating heavy machinery, perform activities at heights, swimming or participation in water activities or provide baby sitting services if your were admitted for syncope or siezures until you have seen by Primary MD or a Neurologist and advised to do so again.  Do not drive when taking Pain medications.    Do not take more than prescribed Pain, Sleep and Anxiety Medications  Special Instructions: If you have smoked or chewed Tobacco  in the last 2  yrs please stop smoking, stop any regular Alcohol  and or any Recreational drug use.  Wear Seat belts while driving.   Please note  You were cared for by a hospitalist during your hospital stay. If you have any questions about your discharge medications or the care you received while you were in the hospital after you are discharged, you can call the unit and asked to speak with the hospitalist on call if the hospitalist that took care of you is not available. Once you are discharged, your primary care physician will handle any further medical issues. Please note that NO REFILLS for any discharge medications will be authorized once you are discharged, as it is imperative that you return to your primary care physician (or establish a relationship with a primary care physician if you do not have one) for your aftercare needs so that they can reassess your need for medications and monitor your lab values.

## 2015-08-20 NOTE — Progress Notes (Signed)
Patient states understanding of discharge instructions.  

## 2015-08-20 NOTE — Progress Notes (Signed)
  Subjective: Still with some crampy abdominal pain. She is continuing to move her bowels and passed flatus.  Objective: Vital signs in last 24 hours: Temp:  [97.7 F (36.5 C)-98.4 F (36.9 C)] 97.7 F (36.5 C) (12/08 0438) Pulse Rate:  [59-71] 71 (12/08 0438) Resp:  [18-20] 20 (12/08 0438) BP: (137-153)/(76-77) 153/76 mmHg (12/08 0438) SpO2:  [92 %-94 %] 92 % (12/08 0438) Last BM Date: 08/18/15  Intake/Output from previous day: 12/07 0701 - 12/08 0700 In: 360 [P.O.:360] Out: -  Intake/Output this shift:    General appearance: alert, cooperative and no distress GI: Soft, nondistended, no rigidity noted. No specific tenderness noted. Bowel sounds active.  Lab Results:   Recent Labs  08/18/15 0707 08/19/15 0622  WBC 5.9 4.2  HGB 11.6* 11.2*  HCT 36.4 36.3  PLT 310 278   BMET  Recent Labs  08/19/15 0622 08/20/15 0704  NA 141 141  K 4.2 4.1  CL 111 112*  CO2 28 26  GLUCOSE 90 87  BUN 8 6  CREATININE 0.80 0.75  CALCIUM 8.4* 8.6*   PT/INR No results for input(s): LABPROT, INR in the last 72 hours.  Studies/Results: No results found.  Anti-infectives: Anti-infectives    Start     Dose/Rate Route Frequency Ordered Stop   08/17/15 2145  cefTRIAXone (ROCEPHIN) 1 g in dextrose 5 % 50 mL IVPB     1 g 100 mL/hr over 30 Minutes Intravenous Every 24 hours 08/17/15 2137     08/17/15 1930  cefTRIAXone (ROCEPHIN) 1 g in dextrose 5 % 50 mL IVPB     1 g 100 mL/hr over 30 Minutes Intravenous  Once 08/17/15 1923 08/17/15 2005      Assessment/Plan: Impression: Small bowel traction, resolving. Patient states that she is back to her baseline state. Her diet may be advanced without difficulty. Okay for discharge from surgery standpoint. No need for surgical intervention. We'll follow peripherally with you.  LOS: 3 days    Shoua Ressler A 08/20/2015

## 2015-09-01 ENCOUNTER — Encounter: Payer: Self-pay | Admitting: Internal Medicine

## 2015-09-01 ENCOUNTER — Ambulatory Visit (INDEPENDENT_AMBULATORY_CARE_PROVIDER_SITE_OTHER): Payer: Medicare Other | Admitting: Internal Medicine

## 2015-09-01 VITALS — BP 121/87 | HR 74 | Temp 96.5°F | Ht 64.0 in | Wt 126.2 lb

## 2015-09-01 DIAGNOSIS — K648 Other hemorrhoids: Secondary | ICD-10-CM | POA: Diagnosis not present

## 2015-09-01 DIAGNOSIS — K642 Third degree hemorrhoids: Secondary | ICD-10-CM

## 2015-09-01 NOTE — Progress Notes (Signed)
[place] banding procedure note:  The patient presents with symptomatic grade 3 hemorrhoids- primarily burning and itching, unresponsive to maximal medical therapy, requesting rubber band ligation of her hemorrhoidal disease. All risks, benefits, and alternative forms of therapy were described and informed consent was obtained.  In the left lateral decubitus position, DRE followed by anoscopy performed. Patient small grade 3 hemorrhoid tag. Prominent right anterior hemorrhoid on anoscopy.  Perianal and perineal redness of the skin noted.  The decision was made to band the right anterior internal hemorrhoid; the [place] was used to perform band ligation without complication. Digital anorectal examination was then performed to assure proper positioning of the band;  Band found to be in excellent position. No pinching or pain.  The patient was discharged home without pain or other issues. Dietary and behavioral recommendations were given.  Miconazole nitrate 2% cream prescribed to the perianal skin 3 times a day along with follow-up instructions. The patient will return in 3 weeks for followup and possible additional banding as required.  No complications were encountered and the patient tolerated the procedure well.

## 2015-09-01 NOTE — Patient Instructions (Addendum)
Avoid straining.  Limit toilet time to 2-5 minutes  Fiber fortified diet  Continue to avoid constipation.  Call with any interim problems    Miconazole 2% cream to the perianal area as directed  Schedule followup appointment 3 weeks from now

## 2015-09-16 DIAGNOSIS — M810 Age-related osteoporosis without current pathological fracture: Secondary | ICD-10-CM | POA: Diagnosis not present

## 2015-09-16 DIAGNOSIS — E039 Hypothyroidism, unspecified: Secondary | ICD-10-CM | POA: Diagnosis not present

## 2015-09-16 DIAGNOSIS — I10 Essential (primary) hypertension: Secondary | ICD-10-CM | POA: Diagnosis not present

## 2015-09-16 DIAGNOSIS — Z79899 Other long term (current) drug therapy: Secondary | ICD-10-CM | POA: Diagnosis not present

## 2015-09-16 DIAGNOSIS — I252 Old myocardial infarction: Secondary | ICD-10-CM | POA: Diagnosis not present

## 2015-09-16 DIAGNOSIS — M8589 Other specified disorders of bone density and structure, multiple sites: Secondary | ICD-10-CM | POA: Diagnosis not present

## 2015-09-19 DIAGNOSIS — K565 Intestinal adhesions [bands] with obstruction (postprocedural) (postinfection): Secondary | ICD-10-CM | POA: Diagnosis not present

## 2015-09-22 ENCOUNTER — Encounter: Payer: Self-pay | Admitting: Cardiology

## 2015-09-22 ENCOUNTER — Ambulatory Visit (INDEPENDENT_AMBULATORY_CARE_PROVIDER_SITE_OTHER): Payer: Medicare Other | Admitting: Cardiology

## 2015-09-22 VITALS — BP 120/70 | HR 55 | Ht 64.0 in | Wt 126.0 lb

## 2015-09-22 DIAGNOSIS — I2510 Atherosclerotic heart disease of native coronary artery without angina pectoris: Secondary | ICD-10-CM | POA: Diagnosis not present

## 2015-09-22 DIAGNOSIS — Z889 Allergy status to unspecified drugs, medicaments and biological substances status: Secondary | ICD-10-CM | POA: Diagnosis not present

## 2015-09-22 DIAGNOSIS — I10 Essential (primary) hypertension: Secondary | ICD-10-CM

## 2015-09-22 DIAGNOSIS — Z789 Other specified health status: Secondary | ICD-10-CM
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
## 2015-09-22 NOTE — Patient Instructions (Signed)
Your physician recommends that you continue on your current medications as directed. Please refer to the Current Medication list given to you today. Your physician recommends that you schedule a follow-up appointment in: 6 months. You will receive a reminder letter in the mail in about 4 months reminding you to call and schedule your appointment. If you don't receive this letter, please contact our office. 

## 2015-09-29 ENCOUNTER — Encounter: Payer: Self-pay | Admitting: Internal Medicine

## 2015-09-29 ENCOUNTER — Ambulatory Visit (INDEPENDENT_AMBULATORY_CARE_PROVIDER_SITE_OTHER): Payer: Medicare Other | Admitting: Internal Medicine

## 2015-09-29 VITALS — BP 139/82 | HR 68 | Temp 98.3°F | Ht 64.0 in | Wt 128.4 lb

## 2015-09-29 DIAGNOSIS — K648 Other hemorrhoids: Secondary | ICD-10-CM | POA: Diagnosis not present

## 2015-09-29 NOTE — Progress Notes (Signed)
[place] banding procedure note:  The patient presents with symptomatic grade 3 hemorrhoid;  Status post banding B. Right anterior hemorrhoid. Don't have a tinea infection at that time. Prescribed miconazole monitoring 2% cream which she used. Still having some burning and itching. Complains of a little redundant tissue between her anus and vagina. She tolerate the first banding very well; patient requesting subsequent banding today.All risks, benefits, and alternative forms of therapy were described and informed consent was obtained.  In the left lateral decubitus position, digital rectal exam revealed no abnormalities aside from a bit of redundant skin anterior to the anal canal but posterior to the vagina.  No evidence of residual tinea infection.  I explained to her this may not reduce with hemorrhoid banding; however,  the perianal tissues may tighten up and subsequent banding. I explained to the patient there was no 100% guarantee that all of her symptoms with some banding.  The decision was made to band the left lateral internal hemorrhoid; the [place] was used to perform band ligation without complication. Digital anorectal examination was then performed to assure proper positioning of the band;  Band found to be in excellent position;  No pinching or pain. The patient was discharged home without pain or other issues. Dietary and behavioral recommendations were given;  trial of nitroglycerin 0.125% ointment pea-sized amount to the anorectum 3 times a day x2 weeks. The patient will return in 3 for followup and possible additional banding as required.  No complications were encountered and the patient tolerated the procedure well.

## 2015-09-29 NOTE — Patient Instructions (Signed)
Avoid straining.  Continue fiber supplement daily  Limit toilet time to 2-5 minutes  Is not or glycerin 0.125% cream-apply a pea-sized amount to the anorectum 3 times a day x2 weeks  Call with any interim problems  Schedule followup appointment in 3 weeks from now

## 2015-10-20 ENCOUNTER — Ambulatory Visit (INDEPENDENT_AMBULATORY_CARE_PROVIDER_SITE_OTHER): Payer: Medicare Other | Admitting: Internal Medicine

## 2015-10-20 ENCOUNTER — Encounter: Payer: Self-pay | Admitting: Internal Medicine

## 2015-10-20 VITALS — BP 125/80 | HR 74 | Temp 98.1°F | Ht 64.0 in | Wt 127.0 lb

## 2015-10-20 DIAGNOSIS — K648 Other hemorrhoids: Secondary | ICD-10-CM

## 2015-10-20 NOTE — Progress Notes (Signed)
[place] banding procedure note:  The patient presents with symptomatic hemorrhoids;  Status post pinning of left lateral and right anterior hemorrhoid column. Patient request a third band today. She notes a small tag between rectum and vagina persists. All risks, benefits, and alternative forms of therapy were described and informed consent was obtained.  In the left lateral decubitus position,  Digital inspection reveals no protruding hemorrhoids. There is a redundant skin between the rectum and the vagina which appears to be just that-a redundant skin tag. Exam otherwise negative.  The decision was made to band the right posterior internal hemorrhoid;  The [place] was used to perform band ligation without complication. Digital anorectal examination was then performed to assure proper positioning of the band; band found to be in excellent position. No pinching or pain; The patient was discharged home without pain or other issues. Dietary and behavioral recommendations were given.  See discharge instructions.  No complications were encountered and the patient tolerated the procedure well.

## 2015-10-20 NOTE — Patient Instructions (Addendum)
Avoid straining.  Benefiber 1 tablespoon twice daily  Limit toilet time to 2-5 minutes  Call with any interim problems  Schedule followup appointment in 3 months from now  As discussed, the small skin tag between your vagina and rectum is just likely that  -  a skin tag. You may need to see a general surgeon to have a very small in office surgical procedure by a general surgeon  to  remove it. However, will see you back in 3 months and see how things are going

## 2015-11-04 ENCOUNTER — Telehealth: Payer: Self-pay | Admitting: Gastroenterology

## 2015-11-04 NOTE — Telephone Encounter (Signed)
I spoke with the pt- she said she didn't need this right now. I told her to call me if she needed it.

## 2015-11-04 NOTE — Telephone Encounter (Signed)
Refill request came from pharmacy. Please call in the following:  Nitroglycerin 0.125% cream compounded-apply a pea-sized amount to the anorectum 3 times a day x2 weeks. 2 week supply with no refills.

## 2015-12-15 DIAGNOSIS — Z961 Presence of intraocular lens: Secondary | ICD-10-CM | POA: Diagnosis not present

## 2015-12-22 DIAGNOSIS — I10 Essential (primary) hypertension: Secondary | ICD-10-CM | POA: Diagnosis not present

## 2015-12-22 DIAGNOSIS — E7800 Pure hypercholesterolemia, unspecified: Secondary | ICD-10-CM | POA: Diagnosis not present

## 2015-12-22 DIAGNOSIS — K210 Gastro-esophageal reflux disease with esophagitis: Secondary | ICD-10-CM | POA: Diagnosis not present

## 2015-12-22 DIAGNOSIS — E782 Mixed hyperlipidemia: Secondary | ICD-10-CM | POA: Diagnosis not present

## 2015-12-22 DIAGNOSIS — D649 Anemia, unspecified: Secondary | ICD-10-CM | POA: Diagnosis not present

## 2015-12-22 DIAGNOSIS — E039 Hypothyroidism, unspecified: Secondary | ICD-10-CM | POA: Diagnosis not present

## 2015-12-29 ENCOUNTER — Encounter: Payer: Self-pay | Admitting: Internal Medicine

## 2015-12-30 DIAGNOSIS — I10 Essential (primary) hypertension: Secondary | ICD-10-CM | POA: Diagnosis not present

## 2015-12-30 DIAGNOSIS — K219 Gastro-esophageal reflux disease without esophagitis: Secondary | ICD-10-CM | POA: Diagnosis not present

## 2015-12-30 DIAGNOSIS — K210 Gastro-esophageal reflux disease with esophagitis: Secondary | ICD-10-CM | POA: Diagnosis not present

## 2015-12-30 DIAGNOSIS — E782 Mixed hyperlipidemia: Secondary | ICD-10-CM | POA: Diagnosis not present

## 2016-01-04 DIAGNOSIS — M810 Age-related osteoporosis without current pathological fracture: Secondary | ICD-10-CM | POA: Diagnosis not present

## 2016-01-05 DIAGNOSIS — M7581 Other shoulder lesions, right shoulder: Secondary | ICD-10-CM | POA: Diagnosis not present

## 2016-01-05 DIAGNOSIS — S83241D Other tear of medial meniscus, current injury, right knee, subsequent encounter: Secondary | ICD-10-CM | POA: Diagnosis not present

## 2016-01-14 ENCOUNTER — Ambulatory Visit (INDEPENDENT_AMBULATORY_CARE_PROVIDER_SITE_OTHER): Payer: Medicare Other | Admitting: Otolaryngology

## 2016-01-14 DIAGNOSIS — D485 Neoplasm of uncertain behavior of skin: Secondary | ICD-10-CM | POA: Diagnosis not present

## 2016-01-14 DIAGNOSIS — L570 Actinic keratosis: Secondary | ICD-10-CM | POA: Diagnosis not present

## 2016-01-19 DIAGNOSIS — M7582 Other shoulder lesions, left shoulder: Secondary | ICD-10-CM | POA: Diagnosis not present

## 2016-01-21 ENCOUNTER — Emergency Department (HOSPITAL_COMMUNITY)
Admission: EM | Admit: 2016-01-21 | Discharge: 2016-01-21 | Disposition: A | Discharge status: Home / Self Care | Payer: Medicare Other | Attending: Emergency Medicine | Admitting: Emergency Medicine

## 2016-01-21 ENCOUNTER — Encounter (HOSPITAL_COMMUNITY): Payer: Self-pay | Admitting: Emergency Medicine

## 2016-01-21 ENCOUNTER — Emergency Department (HOSPITAL_COMMUNITY): Payer: Medicare Other

## 2016-01-21 DIAGNOSIS — E039 Hypothyroidism, unspecified: Secondary | ICD-10-CM | POA: Insufficient documentation

## 2016-01-21 DIAGNOSIS — Z85828 Personal history of other malignant neoplasm of skin: Secondary | ICD-10-CM | POA: Diagnosis not present

## 2016-01-21 DIAGNOSIS — I252 Old myocardial infarction: Secondary | ICD-10-CM | POA: Diagnosis not present

## 2016-01-21 DIAGNOSIS — M1990 Unspecified osteoarthritis, unspecified site: Secondary | ICD-10-CM | POA: Diagnosis not present

## 2016-01-21 DIAGNOSIS — Z79899 Other long term (current) drug therapy: Secondary | ICD-10-CM | POA: Insufficient documentation

## 2016-01-21 DIAGNOSIS — E785 Hyperlipidemia, unspecified: Secondary | ICD-10-CM | POA: Insufficient documentation

## 2016-01-21 DIAGNOSIS — M25512 Pain in left shoulder: Secondary | ICD-10-CM | POA: Diagnosis not present

## 2016-01-21 DIAGNOSIS — I2510 Atherosclerotic heart disease of native coronary artery without angina pectoris: Secondary | ICD-10-CM | POA: Diagnosis not present

## 2016-01-21 DIAGNOSIS — I10 Essential (primary) hypertension: Secondary | ICD-10-CM | POA: Diagnosis not present

## 2016-01-21 DIAGNOSIS — M19012 Primary osteoarthritis, left shoulder: Secondary | ICD-10-CM | POA: Diagnosis not present

## 2016-01-21 LAB — CBC WITH DIFFERENTIAL/PLATELET
BASOS ABS: 0 10*3/uL (ref 0.0–0.1)
Basophils Relative: 0 %
EOS PCT: 0 %
Eosinophils Absolute: 0 10*3/uL (ref 0.0–0.7)
HEMATOCRIT: 37.4 % (ref 36.0–46.0)
HEMOGLOBIN: 12.1 g/dL (ref 12.0–15.0)
LYMPHS ABS: 0.6 10*3/uL — AB (ref 0.7–4.0)
LYMPHS PCT: 7 %
MCH: 30.2 pg (ref 26.0–34.0)
MCHC: 32.4 g/dL (ref 30.0–36.0)
MCV: 93.3 fL (ref 78.0–100.0)
Monocytes Absolute: 0.6 10*3/uL (ref 0.1–1.0)
Monocytes Relative: 6 %
NEUTROS ABS: 8.5 10*3/uL — AB (ref 1.7–7.7)
NEUTROS PCT: 87 %
PLATELETS: 226 10*3/uL (ref 150–400)
RBC: 4.01 MIL/uL (ref 3.87–5.11)
RDW: 14.9 % (ref 11.5–15.5)
WBC: 9.8 10*3/uL (ref 4.0–10.5)

## 2016-01-21 LAB — COMPREHENSIVE METABOLIC PANEL
ALBUMIN: 3.4 g/dL — AB (ref 3.5–5.0)
ALK PHOS: 50 U/L (ref 38–126)
ALT: 21 U/L (ref 14–54)
AST: 15 U/L (ref 15–41)
Anion gap: 5 (ref 5–15)
BILIRUBIN TOTAL: 0.3 mg/dL (ref 0.3–1.2)
BUN: 24 mg/dL — AB (ref 6–20)
CO2: 25 mmol/L (ref 22–32)
CREATININE: 0.91 mg/dL (ref 0.44–1.00)
Calcium: 8.4 mg/dL — ABNORMAL LOW (ref 8.9–10.3)
Chloride: 109 mmol/L (ref 101–111)
GFR calc Af Amer: >60 mL/min (ref >60)
GFR, EST NON AFRICAN AMERICAN: 58 mL/min — AB (ref >60)
GLUCOSE: 122 mg/dL — AB (ref 65–99)
POTASSIUM: 4.6 mmol/L (ref 3.5–5.1)
Sodium: 139 mmol/L (ref 135–145)
TOTAL PROTEIN: 5.9 g/dL — AB (ref 6.5–8.1)

## 2016-01-21 LAB — TROPONIN I: Troponin I: <0.03 ng/mL (ref <0.031)

## 2016-01-21 MED ORDER — HYDROCODONE-ACETAMINOPHEN 5-325 MG PO TABS: 1.0000 | ORAL_TABLET | ORAL | Status: DC | PRN

## 2016-01-21 MED ORDER — HYDROCODONE-ACETAMINOPHEN 5-325 MG PO TABS
1.0000 | ORAL_TABLET | Freq: Once | ORAL | Status: AC
  Administered 2016-01-21: 1 via ORAL
  Filled 2016-01-21: qty 1

## 2016-01-21 NOTE — ED Notes (Signed)
Patient verbalizes understanding of discharge instructions, prescriptions, home care and follow up care. Patient out of department at this time. 

## 2016-01-21 NOTE — ED Notes (Signed)
Patient ambulatory to restroom, steady gait.

## 2016-01-21 NOTE — ED Notes (Signed)
Pt reports left shoulder pain and left arm pain for last several weeks. Pt reports recently received a cortisone injection and reports continued pain. Pt denies any recent injury and reports tramadol is not helping the pain.

## 2016-01-21 NOTE — Discharge Instructions (Signed)
Osteoarthritis Osteoarthritis is a disease that causes soreness and inflammation of a joint. It occurs when the cartilage at the affected joint wears down. Cartilage acts as a cushion, covering the ends of bones where they meet to form a joint. Osteoarthritis is the most common form of arthritis. It often occurs in older people. The joints affected most often by this condition include those in the:  Ends of the fingers.  Thumbs.  Neck.  Lower back.  Knees.  Hips. CAUSES  Over time, the cartilage that covers the ends of bones begins to wear away. This causes bone to rub on bone, producing pain and stiffness in the affected joints.  RISK FACTORS Certain factors can increase your chances of having osteoarthritis, including:  Older age.  Excessive body weight.  Overuse of joints.  Previous joint injury. SIGNS AND SYMPTOMS   Pain, swelling, and stiffness in the joint.  Over time, the joint may lose its normal shape.  Small deposits of bone (osteophytes) may grow on the edges of the joint.  Bits of bone or cartilage can break off and float inside the joint space. This may cause more pain and damage. DIAGNOSIS  Your health care provider will do a physical exam and ask about your symptoms. Various tests may be ordered, such as:  X-rays of the affected joint.  Blood tests to rule out other types of arthritis. Additional tests may be used to diagnose your condition. TREATMENT  Goals of treatment are to control pain and improve joint function. Treatment plans may include:  A prescribed exercise program that allows for rest and joint relief.  A weight control plan.  Pain relief techniques, such as:  Properly applied heat and cold.  Electric pulses delivered to nerve endings under the skin (transcutaneous electrical nerve stimulation [TENS]).  Massage.  Certain nutritional supplements.  Medicines to control pain, such as:  Acetaminophen.  Nonsteroidal  anti-inflammatory drugs (NSAIDs), such as naproxen.  Narcotic or central-acting agents, such as tramadol.  Corticosteroids. These can be given orally or as an injection.  Surgery to reposition the bones and relieve pain (osteotomy) or to remove loose pieces of bone and cartilage. Joint replacement may be needed in advanced states of osteoarthritis. HOME CARE INSTRUCTIONS   Take medicines only as directed by your health care provider.  Maintain a healthy weight. Follow your health care provider's instructions for weight control. This may include dietary instructions.  Exercise as directed. Your health care provider can recommend specific types of exercise. These may include:  Strengthening exercises. These are done to strengthen the muscles that support joints affected by arthritis. They can be performed with weights or with exercise bands to add resistance.  Aerobic activities. These are exercises, such as brisk walking or low-impact aerobics, that get your heart pumping.  Range-of-motion activities. These keep your joints limber.  Balance and agility exercises. These help you maintain daily living skills.  Rest your affected joints as directed by your health care provider.  Keep all follow-up visits as directed by your health care provider. SEEK MEDICAL CARE IF:   Your skin turns red.  You develop a rash in addition to your joint pain.  You have worsening joint pain.  You have a fever along with joint or muscle aches. SEEK IMMEDIATE MEDICAL CARE IF:  You have a significant loss of weight or appetite.  You have night sweats. [place] of Arthritis and Musculoskeletal and Skin Diseases: www.niams.SouthExposed.es  National Institute on  Aging: http://kim-miller.com/  American College of Rheumatology: www.rheumatology.org   This information is not intended to replace advice given to you by your health care provider. Make sure you discuss any questions you  have with your health care provider.   Document Released: 08/29/2005 Document Revised: 09/19/2014 Document Reviewed: 05/06/2013 Elsevier Interactive Patient Education 2016 [place] therapy can help ease sore, stiff, injured, and tight muscles and joints. Heat relaxes your muscles, which may help ease your pain.  RISKS AND COMPLICATIONS If you have any of the following conditions, do not use heat therapy unless your health care provider has approved:  Poor circulation.  Healing wounds or scarred skin in the area being treated.  Diabetes, heart disease, or high blood pressure.  Not being able to feel (numbness) the area being treated.  Unusual swelling of the area being treated.  Active infections.  Blood clots.  Cancer.  Inability to communicate pain. This may include young children and people who have problems with their brain function (dementia).  Pregnancy. Heat therapy should only be used on old, pre-existing, or long-lasting (chronic) injuries. Do not use heat therapy on new injuries unless directed by your health care provider. HOW TO USE HEAT THERAPY There are several different kinds of heat therapy, including:  Moist heat pack.  Warm water bath.  Hot water bottle.  Electric heating pad.  Heated gel pack.  Heated wrap.  Electric heating pad. Use the heat therapy method suggested by your health care provider. Follow your health care provider's instructions on when and how to use heat therapy. GENERAL HEAT THERAPY RECOMMENDATIONS  Do not sleep while using heat therapy. Only use heat therapy while you are awake.  Your skin may turn pink while using heat therapy. Do not use heat therapy if your skin turns red.  Do not use heat therapy if you have new pain.  High heat or long exposure to heat can cause burns. Be careful when using heat therapy to avoid burning your skin.  Do not use heat therapy on areas of your skin that are already  irritated, such as with a rash or sunburn. SEEK MEDICAL CARE IF:  You have blisters, redness, swelling, or numbness.  You have new pain.  Your pain is worse. MAKE SURE YOU:  Understand these instructions.  Will watch your condition.  Will get help right away if you are not doing well or get worse.   This information is not intended to replace advice given to you by your health care provider. Make sure you discuss any questions you have with your health care provider.   Document Released: 11/21/2011 Document Revised: 09/19/2014 Document Reviewed: 10/22/2013 Elsevier Interactive Patient Education 2016 [place] may take the hydrocodone prescribed for pain relief.  This will make you drowsy - do not drive within 4 hours of taking this medication.
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
## 2016-02-04 ENCOUNTER — Ambulatory Visit (INDEPENDENT_AMBULATORY_CARE_PROVIDER_SITE_OTHER): Payer: Medicare Other | Admitting: Otolaryngology

## 2016-02-04 DIAGNOSIS — L570 Actinic keratosis: Secondary | ICD-10-CM | POA: Diagnosis not present

## 2016-02-22 DIAGNOSIS — M2550 Pain in unspecified joint: Secondary | ICD-10-CM | POA: Diagnosis not present

## 2016-02-22 DIAGNOSIS — M150 Primary generalized (osteo)arthritis: Secondary | ICD-10-CM | POA: Diagnosis not present

## 2016-02-22 DIAGNOSIS — M810 Age-related osteoporosis without current pathological fracture: Secondary | ICD-10-CM | POA: Diagnosis not present

## 2016-02-22 DIAGNOSIS — M791 Myalgia: Secondary | ICD-10-CM | POA: Diagnosis not present

## 2016-02-22 DIAGNOSIS — M79641 Pain in right hand: Secondary | ICD-10-CM | POA: Diagnosis not present

## 2016-02-22 DIAGNOSIS — M79642 Pain in left hand: Secondary | ICD-10-CM | POA: Diagnosis not present

## 2016-02-22 DIAGNOSIS — M545 Low back pain: Secondary | ICD-10-CM | POA: Diagnosis not present

## 2016-03-14 DIAGNOSIS — M810 Age-related osteoporosis without current pathological fracture: Secondary | ICD-10-CM | POA: Diagnosis not present

## 2016-03-14 DIAGNOSIS — M150 Primary generalized (osteo)arthritis: Secondary | ICD-10-CM | POA: Diagnosis not present

## 2016-03-14 DIAGNOSIS — M545 Low back pain: Secondary | ICD-10-CM | POA: Diagnosis not present

## 2016-03-14 DIAGNOSIS — M791 Myalgia: Secondary | ICD-10-CM | POA: Diagnosis not present

## 2016-03-14 DIAGNOSIS — M154 Erosive (osteo)arthritis: Secondary | ICD-10-CM | POA: Diagnosis not present

## 2016-03-22 DIAGNOSIS — G44209 Tension-type headache, unspecified, not intractable: Secondary | ICD-10-CM | POA: Diagnosis not present

## 2016-03-22 DIAGNOSIS — L03115 Cellulitis of right lower limb: Secondary | ICD-10-CM | POA: Diagnosis not present

## 2016-03-25 ENCOUNTER — Encounter: Payer: Self-pay | Admitting: Cardiology

## 2016-03-25 ENCOUNTER — Ambulatory Visit (INDEPENDENT_AMBULATORY_CARE_PROVIDER_SITE_OTHER): Payer: Medicare Other | Admitting: Cardiology

## 2016-03-25 VITALS — BP 122/78 | HR 81 | Ht 64.0 in | Wt 125.0 lb

## 2016-03-25 DIAGNOSIS — I10 Essential (primary) hypertension: Secondary | ICD-10-CM | POA: Diagnosis not present

## 2016-03-25 DIAGNOSIS — Z889 Allergy status to unspecified drugs, medicaments and biological substances status: Secondary | ICD-10-CM

## 2016-03-25 DIAGNOSIS — I2510 Atherosclerotic heart disease of native coronary artery without angina pectoris: Secondary | ICD-10-CM

## 2016-03-25 DIAGNOSIS — Z789 Other specified health status: Secondary | ICD-10-CM

## 2016-03-25 MED ORDER — ISOSORBIDE MONONITRATE ER 30 MG PO TB24: 15.0000 mg | ORAL_TABLET | Freq: Every day | ORAL | Status: DC
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
## 2016-03-25 NOTE — Patient Instructions (Signed)

## 2016-04-07 DIAGNOSIS — L600 Ingrowing nail: Secondary | ICD-10-CM | POA: Diagnosis not present

## 2016-04-07 DIAGNOSIS — L03031 Cellulitis of right toe: Secondary | ICD-10-CM | POA: Diagnosis not present

## 2016-04-07 DIAGNOSIS — M79674 Pain in right toe(s): Secondary | ICD-10-CM | POA: Diagnosis not present

## 2016-04-07 DIAGNOSIS — M79671 Pain in right foot: Secondary | ICD-10-CM | POA: Diagnosis not present

## 2016-04-28 DIAGNOSIS — L03031 Cellulitis of right toe: Secondary | ICD-10-CM | POA: Diagnosis not present

## 2016-04-28 DIAGNOSIS — M79671 Pain in right foot: Secondary | ICD-10-CM | POA: Diagnosis not present

## 2016-04-28 DIAGNOSIS — L600 Ingrowing nail: Secondary | ICD-10-CM | POA: Diagnosis not present

## 2016-04-28 DIAGNOSIS — M79674 Pain in right toe(s): Secondary | ICD-10-CM | POA: Diagnosis not present

## 2016-05-03 DIAGNOSIS — K210 Gastro-esophageal reflux disease with esophagitis: Secondary | ICD-10-CM | POA: Diagnosis not present

## 2016-05-03 DIAGNOSIS — E7800 Pure hypercholesterolemia, unspecified: Secondary | ICD-10-CM | POA: Diagnosis not present

## 2016-05-03 DIAGNOSIS — E039 Hypothyroidism, unspecified: Secondary | ICD-10-CM | POA: Diagnosis not present

## 2016-05-03 DIAGNOSIS — I10 Essential (primary) hypertension: Secondary | ICD-10-CM | POA: Diagnosis not present

## 2016-05-03 DIAGNOSIS — E782 Mixed hyperlipidemia: Secondary | ICD-10-CM | POA: Diagnosis not present

## 2016-05-06 DIAGNOSIS — K219 Gastro-esophageal reflux disease without esophagitis: Secondary | ICD-10-CM | POA: Diagnosis not present

## 2016-05-06 DIAGNOSIS — E782 Mixed hyperlipidemia: Secondary | ICD-10-CM | POA: Diagnosis not present

## 2016-05-06 DIAGNOSIS — K210 Gastro-esophageal reflux disease with esophagitis: Secondary | ICD-10-CM | POA: Diagnosis not present

## 2016-05-06 DIAGNOSIS — I10 Essential (primary) hypertension: Secondary | ICD-10-CM | POA: Diagnosis not present

## 2016-05-30 DIAGNOSIS — Z23 Encounter for immunization: Secondary | ICD-10-CM | POA: Diagnosis not present

## 2016-06-20 DIAGNOSIS — Z961 Presence of intraocular lens: Secondary | ICD-10-CM | POA: Diagnosis not present

## 2016-08-11 ENCOUNTER — Ambulatory Visit (INDEPENDENT_AMBULATORY_CARE_PROVIDER_SITE_OTHER): Payer: Medicare Other | Admitting: Otolaryngology

## 2016-08-11 DIAGNOSIS — D2321 Other benign neoplasm of skin of right ear and external auricular canal: Secondary | ICD-10-CM | POA: Diagnosis not present

## 2016-09-02 DIAGNOSIS — E782 Mixed hyperlipidemia: Secondary | ICD-10-CM | POA: Diagnosis not present

## 2016-09-02 DIAGNOSIS — E039 Hypothyroidism, unspecified: Secondary | ICD-10-CM | POA: Diagnosis not present

## 2016-09-02 DIAGNOSIS — E7800 Pure hypercholesterolemia, unspecified: Secondary | ICD-10-CM | POA: Diagnosis not present

## 2016-09-02 DIAGNOSIS — D649 Anemia, unspecified: Secondary | ICD-10-CM | POA: Diagnosis not present

## 2016-09-02 DIAGNOSIS — I10 Essential (primary) hypertension: Secondary | ICD-10-CM | POA: Diagnosis not present

## 2016-09-06 DIAGNOSIS — I10 Essential (primary) hypertension: Secondary | ICD-10-CM | POA: Diagnosis not present

## 2016-09-06 DIAGNOSIS — Z1212 Encounter for screening for malignant neoplasm of rectum: Secondary | ICD-10-CM | POA: Diagnosis not present

## 2016-09-06 DIAGNOSIS — K219 Gastro-esophageal reflux disease without esophagitis: Secondary | ICD-10-CM | POA: Diagnosis not present

## 2016-09-06 DIAGNOSIS — E782 Mixed hyperlipidemia: Secondary | ICD-10-CM | POA: Diagnosis not present

## 2016-09-06 DIAGNOSIS — Z0001 Encounter for general adult medical examination with abnormal findings: Secondary | ICD-10-CM | POA: Diagnosis not present

## 2016-10-01 DIAGNOSIS — R109 Unspecified abdominal pain: Secondary | ICD-10-CM | POA: Diagnosis not present

## 2016-10-01 DIAGNOSIS — Z9049 Acquired absence of other specified parts of digestive tract: Secondary | ICD-10-CM | POA: Diagnosis not present

## 2016-10-08 DIAGNOSIS — D649 Anemia, unspecified: Secondary | ICD-10-CM | POA: Diagnosis not present

## 2016-10-08 DIAGNOSIS — D539 Nutritional anemia, unspecified: Secondary | ICD-10-CM | POA: Diagnosis not present

## 2016-12-08 ENCOUNTER — Ambulatory Visit (INDEPENDENT_AMBULATORY_CARE_PROVIDER_SITE_OTHER): Payer: Medicare Other | Admitting: Otolaryngology

## 2016-12-08 DIAGNOSIS — D2321 Other benign neoplasm of skin of right ear and external auricular canal: Secondary | ICD-10-CM | POA: Diagnosis not present

## 2016-12-11 DIAGNOSIS — K56609 Unspecified intestinal obstruction, unspecified as to partial versus complete obstruction: Secondary | ICD-10-CM

## 2016-12-11 HISTORY — DX: Unspecified intestinal obstruction, unspecified as to partial versus complete obstruction: K56.609

## 2016-12-29 ENCOUNTER — Other Ambulatory Visit: Payer: Self-pay | Admitting: Cardiology

## 2016-12-30 ENCOUNTER — Other Ambulatory Visit: Payer: Self-pay | Admitting: Otolaryngology

## 2016-12-30 DIAGNOSIS — R1084 Generalized abdominal pain: Secondary | ICD-10-CM | POA: Diagnosis not present

## 2016-12-30 DIAGNOSIS — R109 Unspecified abdominal pain: Secondary | ICD-10-CM | POA: Diagnosis not present

## 2016-12-30 DIAGNOSIS — K5669 Other partial intestinal obstruction: Secondary | ICD-10-CM | POA: Diagnosis not present

## 2016-12-31 DIAGNOSIS — Z8042 Family history of malignant neoplasm of prostate: Secondary | ICD-10-CM | POA: Diagnosis not present

## 2016-12-31 DIAGNOSIS — K5651 Intestinal adhesions [bands], with partial obstruction: Secondary | ICD-10-CM | POA: Diagnosis not present

## 2016-12-31 DIAGNOSIS — K56699 Other intestinal obstruction unspecified as to partial versus complete obstruction: Secondary | ICD-10-CM | POA: Diagnosis not present

## 2016-12-31 DIAGNOSIS — K5669 Other partial intestinal obstruction: Secondary | ICD-10-CM | POA: Diagnosis not present

## 2016-12-31 DIAGNOSIS — Z841 Family history of disorders of kidney and ureter: Secondary | ICD-10-CM | POA: Diagnosis not present

## 2016-12-31 DIAGNOSIS — K56609 Unspecified intestinal obstruction, unspecified as to partial versus complete obstruction: Secondary | ICD-10-CM | POA: Diagnosis not present

## 2016-12-31 DIAGNOSIS — K565 Intestinal adhesions [bands], unspecified as to partial versus complete obstruction: Secondary | ICD-10-CM | POA: Diagnosis not present

## 2016-12-31 DIAGNOSIS — I10 Essential (primary) hypertension: Secondary | ICD-10-CM | POA: Diagnosis not present

## 2016-12-31 DIAGNOSIS — H04123 Dry eye syndrome of bilateral lacrimal glands: Secondary | ICD-10-CM | POA: Diagnosis not present

## 2016-12-31 DIAGNOSIS — M1990 Unspecified osteoarthritis, unspecified site: Secondary | ICD-10-CM | POA: Diagnosis not present

## 2016-12-31 DIAGNOSIS — Z809 Family history of malignant neoplasm, unspecified: Secondary | ICD-10-CM | POA: Diagnosis not present

## 2016-12-31 DIAGNOSIS — R109 Unspecified abdominal pain: Secondary | ICD-10-CM | POA: Diagnosis not present

## 2016-12-31 DIAGNOSIS — Z888 Allergy status to other drugs, medicaments and biological substances status: Secondary | ICD-10-CM | POA: Diagnosis not present

## 2016-12-31 DIAGNOSIS — Z825 Family history of asthma and other chronic lower respiratory diseases: Secondary | ICD-10-CM | POA: Diagnosis not present

## 2016-12-31 DIAGNOSIS — Z7982 Long term (current) use of aspirin: Secondary | ICD-10-CM | POA: Diagnosis not present

## 2016-12-31 DIAGNOSIS — D509 Iron deficiency anemia, unspecified: Secondary | ICD-10-CM | POA: Diagnosis not present

## 2016-12-31 DIAGNOSIS — Z886 Allergy status to analgesic agent status: Secondary | ICD-10-CM | POA: Diagnosis not present

## 2016-12-31 DIAGNOSIS — Z800 Family history of malignant neoplasm of digestive organs: Secondary | ICD-10-CM | POA: Diagnosis not present

## 2016-12-31 DIAGNOSIS — Z8249 Family history of ischemic heart disease and other diseases of the circulatory system: Secondary | ICD-10-CM | POA: Diagnosis not present

## 2016-12-31 DIAGNOSIS — R1084 Generalized abdominal pain: Secondary | ICD-10-CM | POA: Diagnosis not present

## 2016-12-31 DIAGNOSIS — I2510 Atherosclerotic heart disease of native coronary artery without angina pectoris: Secondary | ICD-10-CM | POA: Diagnosis not present

## 2016-12-31 DIAGNOSIS — K219 Gastro-esophageal reflux disease without esophagitis: Secondary | ICD-10-CM | POA: Diagnosis not present

## 2016-12-31 DIAGNOSIS — Z79899 Other long term (current) drug therapy: Secondary | ICD-10-CM | POA: Diagnosis not present

## 2016-12-31 DIAGNOSIS — E039 Hypothyroidism, unspecified: Secondary | ICD-10-CM | POA: Diagnosis not present

## 2017-01-04 DIAGNOSIS — E782 Mixed hyperlipidemia: Secondary | ICD-10-CM | POA: Diagnosis not present

## 2017-01-04 DIAGNOSIS — D649 Anemia, unspecified: Secondary | ICD-10-CM | POA: Diagnosis not present

## 2017-01-04 DIAGNOSIS — E7800 Pure hypercholesterolemia, unspecified: Secondary | ICD-10-CM | POA: Diagnosis not present

## 2017-01-04 DIAGNOSIS — E039 Hypothyroidism, unspecified: Secondary | ICD-10-CM | POA: Diagnosis not present

## 2017-01-04 DIAGNOSIS — I10 Essential (primary) hypertension: Secondary | ICD-10-CM | POA: Diagnosis not present

## 2017-01-09 ENCOUNTER — Encounter (HOSPITAL_BASED_OUTPATIENT_CLINIC_OR_DEPARTMENT_OTHER): Payer: Self-pay | Admitting: *Deleted

## 2017-01-09 NOTE — Progress Notes (Signed)
Chart and cardiology notes reviewed by Dr Marcell Barlow. [facility] for surgery with Dr.Teoh/ day surgery.

## 2017-01-24 DIAGNOSIS — E782 Mixed hyperlipidemia: Secondary | ICD-10-CM | POA: Diagnosis not present

## 2017-01-24 DIAGNOSIS — I10 Essential (primary) hypertension: Secondary | ICD-10-CM | POA: Diagnosis not present

## 2017-01-24 DIAGNOSIS — E039 Hypothyroidism, unspecified: Secondary | ICD-10-CM | POA: Diagnosis not present

## 2017-01-24 DIAGNOSIS — K219 Gastro-esophageal reflux disease without esophagitis: Secondary | ICD-10-CM | POA: Diagnosis not present

## 2017-01-30 ENCOUNTER — Observation Stay (HOSPITAL_COMMUNITY)
Admission: EM | Admit: 2017-01-30 | Discharge: 2017-02-01 | Disposition: A | Discharge status: Home / Self Care | Payer: Medicare Other | Attending: Internal Medicine | Admitting: Internal Medicine

## 2017-01-30 ENCOUNTER — Emergency Department (HOSPITAL_COMMUNITY): Payer: Medicare Other

## 2017-01-30 ENCOUNTER — Encounter (HOSPITAL_COMMUNITY): Payer: Self-pay | Admitting: Emergency Medicine

## 2017-01-30 ENCOUNTER — Other Ambulatory Visit: Payer: Self-pay

## 2017-01-30 DIAGNOSIS — R0602 Shortness of breath: Secondary | ICD-10-CM | POA: Diagnosis not present

## 2017-01-30 DIAGNOSIS — I7102 Dissection of abdominal aorta: Secondary | ICD-10-CM | POA: Insufficient documentation

## 2017-01-30 DIAGNOSIS — Z791 Long term (current) use of non-steroidal anti-inflammatories (NSAID): Secondary | ICD-10-CM | POA: Diagnosis not present

## 2017-01-30 DIAGNOSIS — E860 Dehydration: Secondary | ICD-10-CM | POA: Diagnosis not present

## 2017-01-30 DIAGNOSIS — K219 Gastro-esophageal reflux disease without esophagitis: Secondary | ICD-10-CM | POA: Insufficient documentation

## 2017-01-30 DIAGNOSIS — R531 Weakness: Secondary | ICD-10-CM

## 2017-01-30 DIAGNOSIS — K5909 Other constipation: Secondary | ICD-10-CM | POA: Diagnosis not present

## 2017-01-30 DIAGNOSIS — Z79899 Other long term (current) drug therapy: Secondary | ICD-10-CM | POA: Diagnosis not present

## 2017-01-30 DIAGNOSIS — K2970 Gastritis, unspecified, without bleeding: Secondary | ICD-10-CM | POA: Insufficient documentation

## 2017-01-30 DIAGNOSIS — Q394 Esophageal web: Secondary | ICD-10-CM | POA: Diagnosis not present

## 2017-01-30 DIAGNOSIS — Z955 Presence of coronary angioplasty implant and graft: Secondary | ICD-10-CM | POA: Diagnosis not present

## 2017-01-30 DIAGNOSIS — I2510 Atherosclerotic heart disease of native coronary artery without angina pectoris: Secondary | ICD-10-CM | POA: Diagnosis not present

## 2017-01-30 DIAGNOSIS — E782 Mixed hyperlipidemia: Secondary | ICD-10-CM | POA: Insufficient documentation

## 2017-01-30 DIAGNOSIS — G8929 Other chronic pain: Secondary | ICD-10-CM | POA: Diagnosis present

## 2017-01-30 DIAGNOSIS — F419 Anxiety disorder, unspecified: Secondary | ICD-10-CM | POA: Diagnosis not present

## 2017-01-30 DIAGNOSIS — K8689 Other specified diseases of pancreas: Secondary | ICD-10-CM | POA: Insufficient documentation

## 2017-01-30 DIAGNOSIS — Z7982 Long term (current) use of aspirin: Secondary | ICD-10-CM | POA: Insufficient documentation

## 2017-01-30 DIAGNOSIS — I252 Old myocardial infarction: Secondary | ICD-10-CM | POA: Insufficient documentation

## 2017-01-30 DIAGNOSIS — R1084 Generalized abdominal pain: Secondary | ICD-10-CM | POA: Insufficient documentation

## 2017-01-30 DIAGNOSIS — E039 Hypothyroidism, unspecified: Secondary | ICD-10-CM | POA: Diagnosis not present

## 2017-01-30 DIAGNOSIS — R1310 Dysphagia, unspecified: Secondary | ICD-10-CM | POA: Diagnosis not present

## 2017-01-30 DIAGNOSIS — R630 Anorexia: Secondary | ICD-10-CM

## 2017-01-30 DIAGNOSIS — I10 Essential (primary) hypertension: Secondary | ICD-10-CM | POA: Diagnosis not present

## 2017-01-30 DIAGNOSIS — R1110 Vomiting, unspecified: Secondary | ICD-10-CM | POA: Diagnosis present

## 2017-01-30 DIAGNOSIS — D509 Iron deficiency anemia, unspecified: Secondary | ICD-10-CM | POA: Insufficient documentation

## 2017-01-30 DIAGNOSIS — R1319 Other dysphagia: Secondary | ICD-10-CM

## 2017-01-30 DIAGNOSIS — M545 Low back pain: Secondary | ICD-10-CM | POA: Diagnosis not present

## 2017-01-30 DIAGNOSIS — R05 Cough: Secondary | ICD-10-CM | POA: Diagnosis not present

## 2017-01-30 HISTORY — DX: Unspecified intestinal obstruction, unspecified as to partial versus complete obstruction: K56.609

## 2017-01-30 LAB — CBC WITH DIFFERENTIAL/PLATELET
Basophils Absolute: 0 10*3/uL (ref 0.0–0.1)
Basophils Relative: 1 %
EOS PCT: 3 %
Eosinophils Absolute: 0.2 10*3/uL (ref 0.0–0.7)
HCT: 36.5 % (ref 36.0–46.0)
HEMOGLOBIN: 11.9 g/dL — AB (ref 12.0–15.0)
LYMPHS ABS: 1.2 10*3/uL (ref 0.7–4.0)
LYMPHS PCT: 19 %
MCH: 29.8 pg (ref 26.0–34.0)
MCHC: 32.6 g/dL (ref 30.0–36.0)
MCV: 91.5 fL (ref 78.0–100.0)
MONOS PCT: 7 %
Monocytes Absolute: 0.5 10*3/uL (ref 0.1–1.0)
NEUTROS PCT: 70 %
Neutro Abs: 4.5 10*3/uL (ref 1.7–7.7)
Platelets: 224 10*3/uL (ref 150–400)
RBC: 3.99 MIL/uL (ref 3.87–5.11)
RDW: 16.6 % — ABNORMAL HIGH (ref 11.5–15.5)
WBC: 6.4 10*3/uL (ref 4.0–10.5)

## 2017-01-30 LAB — BRAIN NATRIURETIC PEPTIDE: B NATRIURETIC PEPTIDE 5: 74 pg/mL (ref 0.0–100.0)

## 2017-01-30 LAB — COMPREHENSIVE METABOLIC PANEL
ALK PHOS: 42 U/L (ref 38–126)
ALT: 18 U/L (ref 14–54)
ANION GAP: 6 (ref 5–15)
AST: 18 U/L (ref 15–41)
Albumin: 3.4 g/dL — ABNORMAL LOW (ref 3.5–5.0)
BILIRUBIN TOTAL: 0.3 mg/dL (ref 0.3–1.2)
BUN: 28 mg/dL — ABNORMAL HIGH (ref 6–20)
CO2: 28 mmol/L (ref 22–32)
Calcium: 8.9 mg/dL (ref 8.9–10.3)
Chloride: 102 mmol/L (ref 101–111)
Creatinine, Ser: 1.06 mg/dL — ABNORMAL HIGH (ref 0.44–1.00)
GFR, EST AFRICAN AMERICAN: 56 mL/min — AB (ref >60)
GFR, EST NON AFRICAN AMERICAN: 48 mL/min — AB (ref >60)
Glucose, Bld: 106 mg/dL — ABNORMAL HIGH (ref 65–99)
Potassium: 4.4 mmol/L (ref 3.5–5.1)
Sodium: 136 mmol/L (ref 135–145)
TOTAL PROTEIN: 5.8 g/dL — AB (ref 6.5–8.1)

## 2017-01-30 LAB — URINALYSIS, ROUTINE W REFLEX MICROSCOPIC
Bilirubin Urine: NEGATIVE
GLUCOSE, UA: NEGATIVE mg/dL
Hgb urine dipstick: NEGATIVE
KETONES UR: NEGATIVE mg/dL
LEUKOCYTES UA: NEGATIVE
NITRITE: NEGATIVE
PROTEIN: NEGATIVE mg/dL
Specific Gravity, Urine: 1.013 (ref 1.005–1.030)
pH: 5 (ref 5.0–8.0)

## 2017-01-30 LAB — TROPONIN I

## 2017-01-30 MED ORDER — ONDANSETRON HCL 4 MG PO TABS: 4.0000 mg | ORAL_TABLET | Freq: Four times a day (QID) | ORAL | Status: DC | PRN

## 2017-01-30 MED ORDER — SODIUM CHLORIDE 0.9 % IV SOLN
INTRAVENOUS | Status: DC
  Administered 2017-01-30: via INTRAVENOUS

## 2017-01-30 MED ORDER — LOSARTAN POTASSIUM 50 MG PO TABS
100.0000 mg | ORAL_TABLET | Freq: Every day | ORAL | Status: DC
  Filled 2017-01-30: qty 2

## 2017-01-30 MED ORDER — PROMETHAZINE HCL 12.5 MG PO TABS
12.5000 mg | ORAL_TABLET | Freq: Once | ORAL | Status: AC
  Administered 2017-01-30: 12.5 mg via ORAL
  Filled 2017-01-30: qty 1

## 2017-01-30 MED ORDER — IOPAMIDOL (ISOVUE-370) INJECTION 76%
100.0000 mL | Freq: Once | INTRAVENOUS | Status: AC | PRN
  Administered 2017-01-30: 100 mL via INTRAVENOUS

## 2017-01-30 MED ORDER — NITROGLYCERIN 0.4 MG SL SUBL: 0.4000 mg | SUBLINGUAL_TABLET | SUBLINGUAL | Status: DC | PRN

## 2017-01-30 MED ORDER — POLYETHYLENE GLYCOL 3350 17 G PO PACK
17.0000 g | PACK | Freq: Every day | ORAL | Status: DC
  Administered 2017-02-01: 17 g via ORAL
  Filled 2017-01-30 (×2): qty 1

## 2017-01-30 MED ORDER — SODIUM CHLORIDE 0.9 % IV BOLUS (SEPSIS)
1000.0000 mL | Freq: Once | INTRAVENOUS | Status: AC
  Administered 2017-01-30: 1000 mL via INTRAVENOUS

## 2017-01-30 MED ORDER — PROMETHAZINE HCL 25 MG/ML IJ SOLN
12.5000 mg | Freq: Once | INTRAMUSCULAR | Status: AC
  Administered 2017-01-30: 12.5 mg via INTRAVENOUS
  Filled 2017-01-30: qty 1

## 2017-01-30 MED ORDER — ONDANSETRON HCL 4 MG/2ML IJ SOLN
4.0000 mg | Freq: Once | INTRAMUSCULAR | Status: AC
  Administered 2017-01-30: 4 mg via INTRAVENOUS
  Filled 2017-01-30: qty 2

## 2017-01-30 MED ORDER — HYDROCHLOROTHIAZIDE 12.5 MG PO CAPS
12.5000 mg | ORAL_CAPSULE | Freq: Every day | ORAL | Status: DC
  Administered 2017-01-31 – 2017-02-01 (×2): 12.5 mg via ORAL
  Filled 2017-01-30 (×2): qty 1

## 2017-01-30 MED ORDER — ASPIRIN EC 81 MG PO TBEC
81.0000 mg | DELAYED_RELEASE_TABLET | Freq: Every day | ORAL | Status: DC
  Administered 2017-01-31 – 2017-02-01 (×2): 81 mg via ORAL
  Filled 2017-01-30 (×2): qty 1

## 2017-01-30 MED ORDER — LISINOPRIL 10 MG PO TABS
40.0000 mg | ORAL_TABLET | Freq: Every day | ORAL | Status: DC
  Administered 2017-01-31 – 2017-02-01 (×2): 40 mg via ORAL
  Filled 2017-01-30 (×2): qty 4

## 2017-01-30 MED ORDER — FERROUS SULFATE 325 (65 FE) MG PO TABS: 325.0000 mg | ORAL_TABLET | Freq: Every day | ORAL | Status: DC

## 2017-01-30 MED ORDER — ALPRAZOLAM 0.25 MG PO TABS: 0.2500 mg | ORAL_TABLET | Freq: Every evening | ORAL | Status: DC | PRN

## 2017-01-30 MED ORDER — ISOSORBIDE MONONITRATE ER 30 MG PO TB24
15.0000 mg | ORAL_TABLET | Freq: Every day | ORAL | Status: DC
  Administered 2017-01-31 – 2017-02-01 (×2): 15 mg via ORAL
  Filled 2017-01-30 (×2): qty 1

## 2017-01-30 MED ORDER — BISACODYL 5 MG PO TBEC: 5.0000 mg | DELAYED_RELEASE_TABLET | Freq: Every day | ORAL | Status: DC | PRN

## 2017-01-30 MED ORDER — ONDANSETRON HCL 4 MG/2ML IJ SOLN: 4.0000 mg | Freq: Four times a day (QID) | INTRAMUSCULAR | Status: DC | PRN

## 2017-01-30 MED ORDER — CYCLOSPORINE 0.05 % OP EMUL
1.0000 [drp] | Freq: Every evening | OPHTHALMIC | Status: DC
  Administered 2017-01-31 – 2017-02-01 (×2): 1 [drp] via OPHTHALMIC
  Filled 2017-01-30 (×2): qty 1

## 2017-01-30 MED ORDER — LEVOTHYROXINE SODIUM 25 MCG PO TABS
137.0000 ug | ORAL_TABLET | Freq: Every day | ORAL | Status: DC
  Administered 2017-01-31 – 2017-02-01 (×2): 137 ug via ORAL
  Filled 2017-01-30 (×2): qty 1

## 2017-01-30 NOTE — ED Notes (Signed)
ED Provider at bedside. 

## 2017-01-30 NOTE — ED Triage Notes (Addendum)
Patient complaining of generalized weakness and shortness of breath since being discharged from hospital approximately 4 weeks ago.
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
## 2017-01-30 NOTE — ED Notes (Signed)
Pt unable to tolerate fluids.

## 2017-01-30 NOTE — ED Notes (Signed)
Patient in radiology

## 2017-01-30 NOTE — ED Notes (Signed)
Patient vomited Phenergan as soon as she took it. MD made aware. New verbal order obtained.
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
## 2017-01-31 ENCOUNTER — Encounter (HOSPITAL_COMMUNITY): Payer: Self-pay | Admitting: Gastroenterology

## 2017-01-31 ENCOUNTER — Encounter (HOSPITAL_COMMUNITY)
Admission: EM | Disposition: A | Discharge status: Home / Self Care | Payer: Self-pay | Source: Home / Self Care | Attending: Emergency Medicine

## 2017-01-31 DIAGNOSIS — K5909 Other constipation: Secondary | ICD-10-CM | POA: Diagnosis not present

## 2017-01-31 DIAGNOSIS — E860 Dehydration: Secondary | ICD-10-CM

## 2017-01-31 DIAGNOSIS — Q394 Esophageal web: Secondary | ICD-10-CM | POA: Diagnosis not present

## 2017-01-31 DIAGNOSIS — K222 Esophageal obstruction: Secondary | ICD-10-CM

## 2017-01-31 DIAGNOSIS — I10 Essential (primary) hypertension: Secondary | ICD-10-CM | POA: Diagnosis not present

## 2017-01-31 DIAGNOSIS — R1310 Dysphagia, unspecified: Secondary | ICD-10-CM | POA: Diagnosis not present

## 2017-01-31 HISTORY — PX: ESOPHAGOGASTRODUODENOSCOPY: SHX5428

## 2017-01-31 HISTORY — PX: SAVORY DILATION: SHX5439

## 2017-01-31 LAB — BASIC METABOLIC PANEL
Anion gap: 5 (ref 5–15)
BUN: 19 mg/dL (ref 6–20)
CHLORIDE: 110 mmol/L (ref 101–111)
CO2: 27 mmol/L (ref 22–32)
Calcium: 8.3 mg/dL — ABNORMAL LOW (ref 8.9–10.3)
Creatinine, Ser: 0.85 mg/dL (ref 0.44–1.00)
GFR calc Af Amer: >60 mL/min (ref >60)
GLUCOSE: 83 mg/dL (ref 65–99)
POTASSIUM: 4.3 mmol/L (ref 3.5–5.1)
Sodium: 142 mmol/L (ref 135–145)

## 2017-01-31 LAB — CBC
HEMATOCRIT: 34.9 % — AB (ref 36.0–46.0)
Hemoglobin: 10.9 g/dL — ABNORMAL LOW (ref 12.0–15.0)
MCH: 29 pg (ref 26.0–34.0)
MCHC: 31.2 g/dL (ref 30.0–36.0)
MCV: 92.8 fL (ref 78.0–100.0)
Platelets: 176 10*3/uL (ref 150–400)
RBC: 3.76 MIL/uL — ABNORMAL LOW (ref 3.87–5.11)
RDW: 16.7 % — ABNORMAL HIGH (ref 11.5–15.5)
WBC: 3.3 10*3/uL — ABNORMAL LOW (ref 4.0–10.5)

## 2017-01-31 SURGERY — EGD (ESOPHAGOGASTRODUODENOSCOPY)
Anesthesia: Moderate Sedation

## 2017-01-31 MED ORDER — MIDAZOLAM HCL 5 MG/5ML IJ SOLN
INTRAMUSCULAR | Status: AC
  Filled 2017-01-31: qty 10

## 2017-01-31 MED ORDER — MINERAL OIL PO OIL
TOPICAL_OIL | ORAL | Status: AC
  Filled 2017-01-31: qty 30

## 2017-01-31 MED ORDER — LIDOCAINE VISCOUS 2 % MT SOLN
OROMUCOSAL | Status: AC
  Filled 2017-01-31: qty 15

## 2017-01-31 MED ORDER — PANTOPRAZOLE SODIUM 40 MG PO TBEC
40.0000 mg | DELAYED_RELEASE_TABLET | Freq: Every day | ORAL | Status: DC
  Administered 2017-01-31: 40 mg via ORAL
  Filled 2017-01-31 (×2): qty 1

## 2017-01-31 MED ORDER — FENTANYL CITRATE (PF) 100 MCG/2ML IJ SOLN
INTRAMUSCULAR | Status: AC
  Filled 2017-01-31: qty 2

## 2017-01-31 MED ORDER — MIDAZOLAM HCL 5 MG/5ML IJ SOLN
INTRAMUSCULAR | Status: DC | PRN
  Administered 2017-01-31 (×2): 2 mg via INTRAVENOUS

## 2017-01-31 MED ORDER — ONDANSETRON HCL 4 MG/2ML IJ SOLN
INTRAMUSCULAR | Status: AC
  Filled 2017-01-31: qty 2

## 2017-01-31 MED ORDER — ONDANSETRON HCL 4 MG/2ML IJ SOLN
INTRAMUSCULAR | Status: DC | PRN
  Administered 2017-01-31: 4 mg via INTRAVENOUS

## 2017-01-31 MED ORDER — FENTANYL CITRATE (PF) 100 MCG/2ML IJ SOLN
INTRAMUSCULAR | Status: DC | PRN
  Administered 2017-01-31: 50 ug via INTRAVENOUS
  Administered 2017-01-31: 25 ug via INTRAVENOUS

## 2017-01-31 MED ORDER — STERILE WATER FOR IRRIGATION IR SOLN
Status: DC | PRN
  Administered 2017-01-31: 13:00:00

## 2017-01-31 MED ORDER — SODIUM CHLORIDE 0.9 % IV SOLN: INTRAVENOUS | Status: DC

## 2017-01-31 MED ORDER — LIDOCAINE VISCOUS 2 % MT SOLN
OROMUCOSAL | Status: DC | PRN
  Administered 2017-01-31: 4 mL via OROMUCOSAL
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
## 2017-01-31 NOTE — Interval H&P Note (Signed)
History and Physical Interval Note:  01/31/2017 12:52 PM  Andrea Jackson  has presented today for surgery, with the diagnosis of dysphaghia  The various methods of treatment have been discussed with the patient and family. After consideration of risks, benefits and other options for treatment, the patient has consented to  Procedure(s) with comments: ESOPHAGOGASTRODUODENOSCOPY (EGD) (N/A) - WITH DILATION  as a surgical intervention .  The patient's history has been reviewed, patient examined, no change in status, stable for surgery.  I have reviewed the patient's chart and labs.  Questions were answered to the patient's satisfaction.     [employer]

## 2017-01-31 NOTE — Op Note (Signed)
[facility] Patient Name: Andrea Jackson Procedure Date: 01/31/2017 12:38 PM MRN: [id number] Date of Birth: 1934-06-27 Attending MD: Barney Drain , MD CSN: [id number] Age: 83 Admit Type: Inpatient Procedure:                Upper GI endoscopy WITH ESOPHAGEAL DILATION Indications:              Dysphagia Providers:                Barney Drain, MD, [place]is [place] B. Sharon Seller, RN, Bonnetta Barry, Technician Referring MD:             Mitzie Na. Daniel MD, MD Medicines:                Meperidine 75 mg IV, Midazolam 4 mg IV, Ondansetron                            4 mg IV Complications:            No immediate complications. Estimated Blood Loss:     Estimated blood loss was minimal. Procedure:                Pre-Anesthesia Assessment:                           - Prior to the procedure, a History and Physical                            was performed, and patient medications and                            allergies were reviewed. The patient's tolerance of                            previous anesthesia was also reviewed. The risks                            and benefits of the procedure and the sedation                            options and risks were discussed with the patient.                            All questions were answered, and informed consent                            was obtained. Prior Anticoagulants: The patient has                            taken aspirin, last dose was 1 day prior to                            procedure. ASA Grade Assessment: II - A patient  with mild systemic disease. After reviewing the                            risks and benefits, the patient was deemed in                            satisfactory condition to undergo the procedure.                            After obtaining informed consent, the endoscope was                            passed under direct vision. Throughout the   procedure, the patient's blood pressure, pulse, and                            oxygen saturations were monitored continuously. The                            [id number] ([id number]) scope was introduced through the                            mouth, and advanced to the second part of duodenum.                            The upper GI endoscopy was technically difficult                            and complex due to the patient's agitation and the                            patient's respiratory instability (hypoxia).                            Successful completion of the procedure was aided by                            managing the patient's medical instability. The                            patient tolerated the procedure fairly well. Scope In: 1:04:14 PM Scope Out: 1:11:41 PM Total Procedure Duration: 0 hours 7 minutes 27 seconds  Findings:      A web was found in the proximal esophagus. A guidewire was placed and       the scope was withdrawn. Dilation was performed with a Savary dilator       with mild resistance at 14 mm, 15 mm, 16 mm and 17 mm. Estimated blood       loss was minimal.      Patchy mild inflammation characterized by congestion (edema) and       erythema was found in the gastric fundus and in the gastric antrum.      The examined duodenum was normal. Impression:               - Web in the  proximal esophagus.                           - MILD Gastritis. Moderate Sedation:      Moderate (conscious) sedation was administered by the endoscopy nurse       and supervised by the endoscopist. The following parameters were       monitored: oxygen saturation, heart rate, blood pressure, and response       to care. Total physician intraservice time was 16 minutes. Recommendation:           - Soft diet.                           - Continue present medications.                           - Return to GI office in 3 months.                           - Return patient to hospital ward for  ongoing care. Procedure Code(s):        --- Professional ---                           [phone], Esophagogastroduodenoscopy, flexible,                            transoral; with insertion of guide wire followed by                            passage of dilator(s) through esophagus over guide                            wire                           99152, Moderate sedation services provided by the                            same physician or other qualified health care                            professional performing the diagnostic or                            therapeutic service that the sedation supports,                            requiring the presence of an independent trained                            observer to assist in the monitoring of the                            patient's level of consciousness and physiological  status; initial 15 minutes of intraservice time,                            patient age 41 years or older Diagnosis Code(s):        --- Professional ---                           Q39.4, Esophageal web                           K29.70, Gastritis, unspecified, without bleeding                           R13.10, Dysphagia, unspecified CPT copyright 2016 American Medical Association. All rights reserved. The codes documented in this report are preliminary and upon coder review may  be revised to meet current compliance requirements. Barney Drain, MD Barney Drain, MD 01/31/2017 1:19:45 PM This report has been signed electronically. Number of Addenda: 0

## 2017-01-31 NOTE — Care Management Obs Status (Signed)
[place] NOTIFICATION   Patient Details  Name: Andrea Jackson MRN: [id number] Date of Birth: 1933-11-18   Medicare Observation Status Notification Given:  Yes    Sherald Barge, RN 01/31/2017, 3:21 PM
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
## 2017-01-31 NOTE — Progress Notes (Signed)
PROGRESS NOTE    Andrea Jackson  NFA:[id number] DOB: Aug 14, 1934 DOA: 01/30/2017 PCP: Caryl Bis, MD    Brief Narrative:  Andrea Jackson is a 83 y.o. female with medical history significant of dysphagia with prior EGD nonspecific findings comes in with problems swallowing water.  She feels like it doesn't go down, and she vomits it up.  This only happens with water.  She eats food and chews it a lot and that goes down however for several days she has had an aversion to eating solids and only drinking water.  She has some abdominal discomfort with it.  No diarrhea.  About 5 lb wt loss.  No fevers.  Dr fields with GI called and patient underwent EGD on 5/22.   Assessment & Plan:   Principal Problem:   Dysphagia Active Problems:   Essential hypertension, benign   Chronic generalized abdominal pain   Chronic constipation   Esophageal dysphagia   Dysphagia - prn zofran ordered - EGD done today - soft diet per GI - GI consulted and following  Essential hypertension, benign - home medications of imdur, HCTZ, lisinopril and NTG  Chronic generalized abdominal pain - no concerns of abdominal pain voiced today  Chronic constipation - Miralax ordered  DVT prophylaxis: scds Code Status:  full Family Communication: none Disposition Plan:  likely discharge in am if patient tolerates diet  Consultants:   Gastroenterology  Procedures:   EGD  Antimicrobials:   none    Subjective: Patient voices she has known something is wrong with her esophagus for a long time.  She is anxious to get EGD to see if she is able to tolerate eating.  Objective: Vitals:   01/31/17 1315 01/31/17 1320 01/31/17 1339 01/31/17 1427  BP: (!) 145/67 130/73 (!) 108/53 125/71  Pulse: 69  (!) 129 61  Resp: 14 20 18 16   Temp:      TempSrc:      SpO2: 100% 97% 93% 95%  Weight:      Height:        Intake/Output Summary (Last 24 hours) at 01/31/17 1623 Last data filed at 01/31/17 0404  Gross  per 24 hour  Intake           1332.5 ml  Output                0 ml  Net           1332.5 ml   Filed Weights   01/30/17 1429 01/30/17 2326  Weight: 56.7 kg (125 lb) 55.6 kg (122 lb 9.6 oz)    Examination:  General exam: Appears calm and comfortable  Respiratory system: Clear to auscultation. Respiratory effort normal. Cardiovascular system: S1 & S2 heard, RRR. No JVD, murmurs, rubs, gallops or clicks. No pedal edema. Gastrointestinal system: Abdomen is nondistended, soft and nontender. No organomegaly or masses felt. Normal bowel sounds heard. Central nervous system: Alert and oriented. No focal neurological deficits. Extremities: Symmetric 5 x 5 power. Skin: No rashes, lesions or ulcers Psychiatry: Judgement and insight appear normal. Mood & affect appropriate.     Data Reviewed: I have personally reviewed following labs and imaging studies  CBC:  Recent Labs Lab 01/30/17 1521 01/31/17 0445  WBC 6.4 3.3*  NEUTROABS 4.5  --   HGB 11.9* 10.9*  HCT 36.5 34.9*  MCV 91.5 92.8  PLT 224 469   Basic Metabolic Panel:  Recent Labs Lab 01/30/17 1521 01/31/17 0445  NA 136 142  K  4.4 4.3  CL 102 110  CO2 28 27  GLUCOSE 106* 83  BUN 28* 19  CREATININE 1.06* 0.85  CALCIUM 8.9 8.3*   GFR: Estimated Creatinine Clearance: 45.6 mL/min (by C-G formula based on SCr of 0.85 mg/dL). Liver Function Tests:  Recent Labs Lab 01/30/17 1521  AST 18  ALT 18  ALKPHOS 42  BILITOT 0.3  PROT 5.8*  ALBUMIN 3.4*   No results for input(s): LIPASE, AMYLASE in the last 168 hours. No results for input(s): AMMONIA in the last 168 hours. Coagulation Profile: No results for input(s): INR, PROTIME in the last 168 hours. Cardiac Enzymes:  Recent Labs Lab 01/30/17 1521  TROPONINI <0.03   BNP (last 3 results) No results for input(s): PROBNP in the last 8760 hours. HbA1C: No results for input(s): HGBA1C in the last 72 hours. CBG: No results for input(s): GLUCAP in the last 168  hours. Lipid Profile: No results for input(s): CHOL, HDL, LDLCALC, TRIG, CHOLHDL, LDLDIRECT in the last 72 hours. Thyroid Function Tests: No results for input(s): TSH, T4TOTAL, FREET4, T3FREE, THYROIDAB in the last 72 hours. Anemia Panel: No results for input(s): VITAMINB12, FOLATE, FERRITIN, TIBC, IRON, RETICCTPCT in the last 72 hours. Sepsis Labs: No results for input(s): PROCALCITON, LATICACIDVEN in the last 168 hours.  No results found for this or any previous visit (from the past 240 hour(s)).       Radiology Studies: Dg Chest 2 View  Result Date: 01/30/2017 CLINICAL DATA:  Shortness of breath, productive cough. EXAM: CHEST  2 VIEW COMPARISON:  Radiographs of July 07, 2015. FINDINGS: The heart size and mediastinal contours are within normal limits. Both lungs are clear. Atherosclerosis thoracic aorta is noted. No pneumothorax or pleural effusion is noted. The visualized skeletal structures are unremarkable. IMPRESSION: No active cardiopulmonary disease.  Aortic atherosclerosis. Electronically Signed   By: Marijo Conception, M.D.   On: 01/30/2017 15:55   Ct Angio Chest/abd/pel For Dissection W And/or Wo Contrast  Result Date: 01/30/2017 CLINICAL DATA:  83 y/o F; back pain, shortness of breath, productive cough. EXAM: CT ANGIOGRAPHY CHEST, ABDOMEN AND PELVIS TECHNIQUE: Multidetector CT imaging through the chest, abdomen and pelvis was performed using the standard protocol during bolus administration of intravenous contrast. Multiplanar reconstructed images and MIPs were obtained and reviewed to evaluate the vascular anatomy. CONTRAST:  100 cc Isovue 370 COMPARISON:  08/17/2015 CT abdomen and pelvis FINDINGS: CTA CHEST FINDINGS Cardiovascular: Preferential opacification of the thoracic aorta. No evidence of thoracic aortic aneurysm or dissection. Normal heart size. No pericardial effusion. Moderate coronary artery calcification. Mild calcific atherosclerosis of thoracic aorta.  Mediastinum/Nodes: 10 mm nodule within the lower left pole of the thyroid. No mediastinal adenopathy. Lungs/Pleura: Lungs are clear. No pleural effusion or pneumothorax. Musculoskeletal: No chest wall abnormality. No acute or significant osseous findings. Review of the MIP images confirms the above findings. CTA ABDOMEN AND PELVIS FINDINGS VASCULAR Aorta: Normal caliber without evidence for vasculitis or significant stenosis. Moderate tortuosity in the mid abdomen. Stable Left laterally directed focal dissection at the bifurcation measuring 19 mm in length (series 9, image 57). Moderate calcified plaque. Celiac: Patent without evidence of aneurysm, dissection, vasculitis or significant stenosis. SMA: Mixed plaque with mild proximal stenosis. Renals: Accessory left renal artery. Renal arteries are patent without evidence of aneurysm, dissection, vasculitis, fibromuscular dysplasia or significant stenosis. IMA: Patent without evidence of aneurysm, dissection, vasculitis or significant stenosis. Inflow: Patent without evidence of aneurysm, dissection, vasculitis or significant stenosis. Veins: No obvious venous abnormality within  the limitations of this arterial phase study. Review of the MIP images confirms the above findings. NON-VASCULAR Hepatobiliary: 5 mm hyperdense focus within segment 5 of the liver (series 5, image 121), likely flash hemangioma. Similar focus in segment 4A (series 5, image 99). No other focal liver abnormality identified. Status post cholecystectomy. Common bile duct measures 8 mm, probably compensatory post cholecystectomy. Pancreas: 5 x 2 mm lucency in head of pancreas, probably a side branch IPMN (series 5, image 115). No pancreatic ductal dilatation or surrounding inflammatory changes. Spleen: Nonspecific subcentimeter lucency in lateral aspect of spleen of unlikely significance, probably a cyst. Spleen is normal in size. Adrenals/Urinary Tract: Left kidney upper pole 26 mm cyst. No urinary  stone disease or additional focal abnormality identified. Normal ureters. Normal bladder. Normal adrenal glands. Stomach/Bowel: Stomach is within normal limits. Appendectomy. Mild sigmoid diverticulosis without evidence for acute diverticulitis. No evidence of bowel wall thickening, distention, or inflammatory changes. Lymphatic: No lymphadenopathy identified. Reproductive: Status post hysterectomy. No adnexal masses. Other: No abdominal wall hernia or abnormality. No abdominopelvic ascites. Musculoskeletal: Moderate dextrocurvature of the lumbar spine with rightward apex at L3. 10 mm rightward subluxation of L3 on L4. Review of the MIP images confirms the above findings. IMPRESSION: 1. Stable focal dissection at the abdominal aortic bifurcation measuring 19 mm in length, unlikely explanation for source of pain. Otherwise no acute vascular abnormality identified. 2. No acute process identified as explanation for pain. 3. Stable chronic changes as above. Electronically Signed   By: Kristine Garbe M.D.   On: 01/30/2017 17:36        Scheduled Meds: . aspirin EC  81 mg Oral Daily  . cycloSPORINE  1 drop Both Eyes QPM  . fentaNYL      . hydrochlorothiazide  12.5 mg Oral Daily  . isosorbide mononitrate  15 mg Oral Daily  . levothyroxine  137 mcg Oral QAC breakfast  . lidocaine      . lisinopril  40 mg Oral Daily  . midazolam      . mineral oil      . ondansetron      . pantoprazole  40 mg Oral QAC supper  . polyethylene glycol  17 g Oral Daily   Continuous Infusions:   LOS: 0 days    Time spent: 30 minutes    Loretha Stapler, MD [facility] Pager [phone]  If 7PM-7AM, please contact night-coverage [email or web address] Password [facility] 01/31/2017, 4:23 PM

## 2017-02-01 ENCOUNTER — Encounter: Payer: Self-pay | Admitting: Internal Medicine

## 2017-02-01 ENCOUNTER — Telehealth: Payer: Self-pay | Admitting: Gastroenterology

## 2017-02-01 DIAGNOSIS — K5900 Constipation, unspecified: Secondary | ICD-10-CM

## 2017-02-01 DIAGNOSIS — R630 Anorexia: Secondary | ICD-10-CM

## 2017-02-01 DIAGNOSIS — G8929 Other chronic pain: Secondary | ICD-10-CM | POA: Diagnosis not present

## 2017-02-01 DIAGNOSIS — K210 Gastro-esophageal reflux disease with esophagitis: Secondary | ICD-10-CM | POA: Diagnosis not present

## 2017-02-01 DIAGNOSIS — K5909 Other constipation: Secondary | ICD-10-CM | POA: Diagnosis not present

## 2017-02-01 DIAGNOSIS — I10 Essential (primary) hypertension: Secondary | ICD-10-CM | POA: Diagnosis not present

## 2017-02-01 DIAGNOSIS — Q394 Esophageal web: Secondary | ICD-10-CM | POA: Diagnosis not present

## 2017-02-01 DIAGNOSIS — K56600 Partial intestinal obstruction, unspecified as to cause: Secondary | ICD-10-CM | POA: Diagnosis not present

## 2017-02-01 DIAGNOSIS — R1084 Generalized abdominal pain: Secondary | ICD-10-CM | POA: Diagnosis not present

## 2017-02-01 DIAGNOSIS — R1310 Dysphagia, unspecified: Secondary | ICD-10-CM | POA: Diagnosis not present

## 2017-02-01 DIAGNOSIS — Z6820 Body mass index (BMI) 20.0-20.9, adult: Secondary | ICD-10-CM | POA: Diagnosis not present

## 2017-02-01 LAB — BASIC METABOLIC PANEL
Anion gap: 7 (ref 5–15)
BUN: 17 mg/dL (ref 6–20)
CALCIUM: 8 mg/dL — AB (ref 8.9–10.3)
CO2: 26 mmol/L (ref 22–32)
Chloride: 108 mmol/L (ref 101–111)
Creatinine, Ser: 0.92 mg/dL (ref 0.44–1.00)
GFR calc non Af Amer: 57 mL/min — ABNORMAL LOW (ref >60)
Glucose, Bld: 86 mg/dL (ref 65–99)
Potassium: 4.3 mmol/L (ref 3.5–5.1)
SODIUM: 141 mmol/L (ref 135–145)

## 2017-02-01 LAB — CBC WITH DIFFERENTIAL/PLATELET
BASOS PCT: 0 %
Basophils Absolute: 0 10*3/uL (ref 0.0–0.1)
EOS ABS: 0.3 10*3/uL (ref 0.0–0.7)
EOS PCT: 6 %
HCT: 33.9 % — ABNORMAL LOW (ref 36.0–46.0)
Hemoglobin: 10.7 g/dL — ABNORMAL LOW (ref 12.0–15.0)
Lymphocytes Relative: 21 %
Lymphs Abs: 1.1 10*3/uL (ref 0.7–4.0)
MCH: 29.6 pg (ref 26.0–34.0)
MCHC: 31.6 g/dL (ref 30.0–36.0)
MCV: 93.6 fL (ref 78.0–100.0)
MONO ABS: 0.5 10*3/uL (ref 0.1–1.0)
MONOS PCT: 10 %
Neutro Abs: 3.2 10*3/uL (ref 1.7–7.7)
Neutrophils Relative %: 63 %
Platelets: 180 10*3/uL (ref 150–400)
RBC: 3.62 MIL/uL — ABNORMAL LOW (ref 3.87–5.11)
RDW: 16.8 % — AB (ref 11.5–15.5)
WBC: 5.1 10*3/uL (ref 4.0–10.5)

## 2017-02-01 NOTE — Telephone Encounter (Signed)
Please make patient a hospital follow up in 2 weeks (dysphagia/anorexia/abnormal pancreas needing surveillance imaging).

## 2017-02-01 NOTE — Care Management Note (Signed)
Case Management Note  Patient Details  Name: Andrea Jackson MRN: [id number] Date of Birth: 1934-09-12  Subjective/Objective:                  Pt admitted with dysphasia. She is from home, lives alone and is ind with ADL's. She has daughter who lives nearby. She has cane and walker if she needs them but had no DME needs pta. She has PCP, drives herself to appointments and has no difficulty affording medications. No needs communicated.   Action/Plan: Plan for return home with self care. No CM needs anticipated.   Expected Discharge Date:    02/01/2017              Expected Discharge Plan:  Home/Self Care  In-House Referral:  NA  Discharge planning Services  CM Consult  Post Acute Care Choice:  NA Choice offered to:   N/A  Status of Service:  Completed, signed off  Sherald Barge, RN 02/01/2017, 11:31 AM

## 2017-02-01 NOTE — Discharge Summary (Signed)
Physician Discharge Summary  Andrea Jackson GGY:[id number] DOB: 1934-07-26 DOA: 01/30/2017  PCP: Caryl Bis, MD  Admit date: 01/30/2017 Discharge date: 02/01/2017  Admitted From: Home Disposition:  Home   Recommendations for Outpatient Follow-up:  1. Follow up with PCP in 1-2 weeks 2. Please obtain BMP/CBC in one week    Discharge Condition: Stable CODE STATUS: FULL Diet recommendation: soft diet   Brief/Interim Summary: 83 y.o.femalewith medical history significant of dysphagia with prior EGD nonspecific findings comes in with problems swallowing water. She feels like it doesn't go down, and she vomits it up. This only happens with water. She eats food and chews it a lot and that goes down however for several days she has had an aversion to eating solids and only drinking water. She has some abdominal discomfort with it. No diarrhea. About 5 lb wt loss. No fevers. Dr fields with GI called and patient underwent EGD on 5/22.  Although the patient still has some abdominal discomfort, there was some improvement after EGD with dilatation. EGD found that the patient had a proximal esophageal web which was dilated by Dr. Oneida Alar.  The patient was able to swallow her pills and tolerate soft foods and liquids albeit with slow eating. The case was discussed with GI prior to discharge. The patient may not get maximal improvement from dilatation for few more days post dilatation.  She will follow up with GI in office in 1 month or sooner if symptoms recur or worsen   Discharge Diagnoses:   Dysphagia/esophageal web - prn zofran ordered - EGD -01/31/2017--> esophageal web dilated -Patient was better able to tolerate liquids and a soft diet post dilatation although she continued to have a degree of discomfort -Case was discussed with GI on the day of discharge--patient may still not Will benefit from rehabilitation for few days -As the patient was able to tolerate liquids and a soft  diet, the patient will be discharged for short-term follow-up - GI consulted and following  Essential hypertension, benign - home medications of imdur, HCTZ, lisinopril and NTG -BP controlled  Chronic generalized abdominal pain - no concerns of abdominal pain voiced today -5/21--CTA abd--probable side branch pancreatic IPMN and focal dissection of abdominal aortic bifurcation measuring 19 mm in length. She had a patent celiac axis. SMA with mild plaque and mild proximal stenosis. Patent IMA -pt likely has degree of adhesions from previous abdominal surgeries  Chronic constipation - Miralax ordered  Abdominal aortic dissection -Cased discussed with vascular surgery--Dr. Evorn Gong CT angiogram abdomen in 6 months; no need for acute intervention  Pancreatic abnormality -incidental finding of probably side branch IPMN -will need MRI surveillance scans  Discharge Instructions  Discharge Instructions    Diet - low sodium heart healthy    Complete by:  As directed    Increase activity slowly    Complete by:  As directed      Allergies as of 02/01/2017      Reactions   Demerol [meperidine]    Patient stated she had a "reaction" to Demerol. Swollen lip.   Celecoxib    REACTION: hyper, couldn't eat or sleep   Codeine    REACTION: itching   Oxycodone-acetaminophen Itching   Statins Other (See Comments)   Muscle aches, can not tolerate any of them per patient.        Medication List    TAKE these medications   ALPRAZolam 0.5 MG tablet Commonly known as:  XANAX Take 0.5-1 tablets by mouth at bedtime  as needed for sleep or anxiety (May take one-half tablet during the day if needed). For anxiety   aspirin EC 81 MG tablet Take 81 mg by mouth daily.   bisacodyl 5 MG EC tablet Commonly known as:  DULCOLAX Take 5 mg by mouth daily as needed for moderate constipation.   [employer] ADV PROBIOTIC GUMMIES PO Take by mouth daily. 1 gummy   diclofenac 75 MG EC tablet Commonly  known as:  VOLTAREN Take 1 tablet by mouth 2 (two) times daily.   esomeprazole 40 MG capsule Commonly known as:  NEXIUM Take 1 capsule (40 mg total) by mouth 2 (two) times daily.   ferrous sulfate 325 (65 FE) MG tablet Take 325 mg by mouth daily with breakfast.   Fish Oil 1000 MG Caps Take 1 capsule by mouth 2 (two) times daily.   hydrochlorothiazide 12.5 MG capsule Commonly known as:  MICROZIDE Take 12.5 mg by mouth daily.   isosorbide mononitrate 30 MG 24 hr tablet Commonly known as:  IMDUR TAKE 1/2 TABLET BY MOUTH DAILY.   levothyroxine 137 MCG tablet Commonly known as:  SYNTHROID, LEVOTHROID Take 137 mcg by mouth daily.   lisinopril 40 MG tablet Commonly known as:  PRINIVIL,ZESTRIL Take 40 mg by mouth daily.   nitroGLYCERIN 0.4 MG SL tablet Commonly known as:  NITROSTAT Place 0.4 mg under the tongue as needed for chest pain.   polyethylene glycol packet Commonly known as:  MIRALAX / GLYCOLAX Take 17 g by mouth daily.   promethazine 25 MG tablet Commonly known as:  PHENERGAN Take 12.5-25 mg by mouth every 6 (six) hours as needed. Nausea,vomiting   RESTASIS 0.05 % ophthalmic emulsion Generic drug:  cycloSPORINE Place 1 drop into both eyes every evening.   traMADol 50 MG tablet Commonly known as:  ULTRAM Take 50 mg by mouth every 6 (six) hours as needed. For pain       Allergies  Allergen Reactions  . Demerol [Meperidine]     Patient stated she had a "reaction" to Demerol. Swollen lip.  . Celecoxib     REACTION: hyper, couldn't eat or sleep  . Codeine     REACTION: itching  . Oxycodone-Acetaminophen Itching  . Statins Other (See Comments)    Muscle aches, can not tolerate any of them per patient.      Consultations:  GI--Dr. Gala Romney   Procedures/Studies: Dg Chest 2 View  Result Date: 01/30/2017 CLINICAL DATA:  Shortness of breath, productive cough. EXAM: CHEST  2 VIEW COMPARISON:  Radiographs of July 07, 2015. FINDINGS: The heart size and  mediastinal contours are within normal limits. Both lungs are clear. Atherosclerosis thoracic aorta is noted. No pneumothorax or pleural effusion is noted. The visualized skeletal structures are unremarkable. IMPRESSION: No active cardiopulmonary disease.  Aortic atherosclerosis. Electronically Signed   By: Marijo Conception, M.D.   On: 01/30/2017 15:55   Ct Angio Chest/abd/pel For Dissection W And/or Wo Contrast  Result Date: 01/30/2017 CLINICAL DATA:  83 y/o F; back pain, shortness of breath, productive cough. EXAM: CT ANGIOGRAPHY CHEST, ABDOMEN AND PELVIS TECHNIQUE: Multidetector CT imaging through the chest, abdomen and pelvis was performed using the standard protocol during bolus administration of intravenous contrast. Multiplanar reconstructed images and MIPs were obtained and reviewed to evaluate the vascular anatomy. CONTRAST:  100 cc Isovue 370 COMPARISON:  08/17/2015 CT abdomen and pelvis FINDINGS: CTA CHEST FINDINGS Cardiovascular: Preferential opacification of the thoracic aorta. No evidence of thoracic aortic aneurysm or dissection. Normal heart size. No  pericardial effusion. Moderate coronary artery calcification. Mild calcific atherosclerosis of thoracic aorta. Mediastinum/Nodes: 10 mm nodule within the lower left pole of the thyroid. No mediastinal adenopathy. Lungs/Pleura: Lungs are clear. No pleural effusion or pneumothorax. Musculoskeletal: No chest wall abnormality. No acute or significant osseous findings. Review of the MIP images confirms the above findings. CTA ABDOMEN AND PELVIS FINDINGS VASCULAR Aorta: Normal caliber without evidence for vasculitis or significant stenosis. Moderate tortuosity in the mid abdomen. Stable Left laterally directed focal dissection at the bifurcation measuring 19 mm in length (series 9, image 57). Moderate calcified plaque. Celiac: Patent without evidence of aneurysm, dissection, vasculitis or significant stenosis. SMA: Mixed plaque with mild proximal stenosis.  Renals: Accessory left renal artery. Renal arteries are patent without evidence of aneurysm, dissection, vasculitis, fibromuscular dysplasia or significant stenosis. IMA: Patent without evidence of aneurysm, dissection, vasculitis or significant stenosis. Inflow: Patent without evidence of aneurysm, dissection, vasculitis or significant stenosis. Veins: No obvious venous abnormality within the limitations of this arterial phase study. Review of the MIP images confirms the above findings. NON-VASCULAR Hepatobiliary: 5 mm hyperdense focus within segment 5 of the liver (series 5, image 121), likely flash hemangioma. Similar focus in segment 4A (series 5, image 99). No other focal liver abnormality identified. Status post cholecystectomy. Common bile duct measures 8 mm, probably compensatory post cholecystectomy. Pancreas: 5 x 2 mm lucency in head of pancreas, probably a side branch IPMN (series 5, image 115). No pancreatic ductal dilatation or surrounding inflammatory changes. Spleen: Nonspecific subcentimeter lucency in lateral aspect of spleen of unlikely significance, probably a cyst. Spleen is normal in size. Adrenals/Urinary Tract: Left kidney upper pole 26 mm cyst. No urinary stone disease or additional focal abnormality identified. Normal ureters. Normal bladder. Normal adrenal glands. Stomach/Bowel: Stomach is within normal limits. Appendectomy. Mild sigmoid diverticulosis without evidence for acute diverticulitis. No evidence of bowel wall thickening, distention, or inflammatory changes. Lymphatic: No lymphadenopathy identified. Reproductive: Status post hysterectomy. No adnexal masses. Other: No abdominal wall hernia or abnormality. No abdominopelvic ascites. Musculoskeletal: Moderate dextrocurvature of the lumbar spine with rightward apex at L3. 10 mm rightward subluxation of L3 on L4. Review of the MIP images confirms the above findings. IMPRESSION: 1. Stable focal dissection at the abdominal aortic  bifurcation measuring 19 mm in length, unlikely explanation for source of pain. Otherwise no acute vascular abnormality identified. 2. No acute process identified as explanation for pain. 3. Stable chronic changes as above. Electronically Signed   By: Kristine Garbe M.D.   On: 01/30/2017 17:36        Discharge Exam: Vitals:   01/31/17 2124 02/01/17 0506  BP: (!) 98/47 130/65  Pulse: 61 63  Resp: 16 18  Temp: 98.7 F (37.1 C) 98.9 F (37.2 C)   Vitals:   01/31/17 1339 01/31/17 1427 01/31/17 2124 02/01/17 0506  BP: (!) 108/53 125/71 (!) 98/47 130/65  Pulse: (!) 129 61 61 63  Resp: 18 16 16 18   Temp:   98.7 F (37.1 C) 98.9 F (37.2 C)  TempSrc:   Oral Oral  SpO2: 93% 95% 91% 95%  Weight:      Height:        General: Pt is alert, awake, not in acute distress Cardiovascular: RRR, S1/S2 +, no rubs, no gallops Respiratory: CTA bilaterally, no wheezing, no rhonchi Abdominal: Soft, NT, ND, bowel sounds + Extremities: no edema, no cyanosis   The results of significant diagnostics from this hospitalization (including imaging, microbiology, ancillary and laboratory) are listed below  for reference.    Significant Diagnostic Studies: Dg Chest 2 View  Result Date: 01/30/2017 CLINICAL DATA:  Shortness of breath, productive cough. EXAM: CHEST  2 VIEW COMPARISON:  Radiographs of July 07, 2015. FINDINGS: The heart size and mediastinal contours are within normal limits. Both lungs are clear. Atherosclerosis thoracic aorta is noted. No pneumothorax or pleural effusion is noted. The visualized skeletal structures are unremarkable. IMPRESSION: No active cardiopulmonary disease.  Aortic atherosclerosis. Electronically Signed   By: Marijo Conception, M.D.   On: 01/30/2017 15:55   Ct Angio Chest/abd/pel For Dissection W And/or Wo Contrast  Result Date: 01/30/2017 CLINICAL DATA:  83 y/o F; back pain, shortness of breath, productive cough. EXAM: CT ANGIOGRAPHY CHEST, ABDOMEN AND PELVIS  TECHNIQUE: Multidetector CT imaging through the chest, abdomen and pelvis was performed using the standard protocol during bolus administration of intravenous contrast. Multiplanar reconstructed images and MIPs were obtained and reviewed to evaluate the vascular anatomy. CONTRAST:  100 cc Isovue 370 COMPARISON:  08/17/2015 CT abdomen and pelvis FINDINGS: CTA CHEST FINDINGS Cardiovascular: Preferential opacification of the thoracic aorta. No evidence of thoracic aortic aneurysm or dissection. Normal heart size. No pericardial effusion. Moderate coronary artery calcification. Mild calcific atherosclerosis of thoracic aorta. Mediastinum/Nodes: 10 mm nodule within the lower left pole of the thyroid. No mediastinal adenopathy. Lungs/Pleura: Lungs are clear. No pleural effusion or pneumothorax. Musculoskeletal: No chest wall abnormality. No acute or significant osseous findings. Review of the MIP images confirms the above findings. CTA ABDOMEN AND PELVIS FINDINGS VASCULAR Aorta: Normal caliber without evidence for vasculitis or significant stenosis. Moderate tortuosity in the mid abdomen. Stable Left laterally directed focal dissection at the bifurcation measuring 19 mm in length (series 9, image 57). Moderate calcified plaque. Celiac: Patent without evidence of aneurysm, dissection, vasculitis or significant stenosis. SMA: Mixed plaque with mild proximal stenosis. Renals: Accessory left renal artery. Renal arteries are patent without evidence of aneurysm, dissection, vasculitis, fibromuscular dysplasia or significant stenosis. IMA: Patent without evidence of aneurysm, dissection, vasculitis or significant stenosis. Inflow: Patent without evidence of aneurysm, dissection, vasculitis or significant stenosis. Veins: No obvious venous abnormality within the limitations of this arterial phase study. Review of the MIP images confirms the above findings. NON-VASCULAR Hepatobiliary: 5 mm hyperdense focus within segment 5 of the  liver (series 5, image 121), likely flash hemangioma. Similar focus in segment 4A (series 5, image 99). No other focal liver abnormality identified. Status post cholecystectomy. Common bile duct measures 8 mm, probably compensatory post cholecystectomy. Pancreas: 5 x 2 mm lucency in head of pancreas, probably a side branch IPMN (series 5, image 115). No pancreatic ductal dilatation or surrounding inflammatory changes. Spleen: Nonspecific subcentimeter lucency in lateral aspect of spleen of unlikely significance, probably a cyst. Spleen is normal in size. Adrenals/Urinary Tract: Left kidney upper pole 26 mm cyst. No urinary stone disease or additional focal abnormality identified. Normal ureters. Normal bladder. Normal adrenal glands. Stomach/Bowel: Stomach is within normal limits. Appendectomy. Mild sigmoid diverticulosis without evidence for acute diverticulitis. No evidence of bowel wall thickening, distention, or inflammatory changes. Lymphatic: No lymphadenopathy identified. Reproductive: Status post hysterectomy. No adnexal masses. Other: No abdominal wall hernia or abnormality. No abdominopelvic ascites. Musculoskeletal: Moderate dextrocurvature of the lumbar spine with rightward apex at L3. 10 mm rightward subluxation of L3 on L4. Review of the MIP images confirms the above findings. IMPRESSION: 1. Stable focal dissection at the abdominal aortic bifurcation measuring 19 mm in length, unlikely explanation for source of pain. Otherwise no acute  vascular abnormality identified. 2. No acute process identified as explanation for pain. 3. Stable chronic changes as above. Electronically Signed   By: Kristine Garbe M.D.   On: 01/30/2017 17:36     Microbiology: No results found for this or any previous visit (from the past 240 hour(s)).   Labs: Basic Metabolic Panel:  Recent Labs Lab 01/30/17 1521 01/31/17 0445 02/01/17 0535  NA 136 142 141  K 4.4 4.3 4.3  CL 102 110 108  CO2 28 27 26     GLUCOSE 106* 83 86  BUN 28* 19 17  CREATININE 1.06* 0.85 0.92  CALCIUM 8.9 8.3* 8.0*   Liver Function Tests:  Recent Labs Lab 01/30/17 1521  AST 18  ALT 18  ALKPHOS 42  BILITOT 0.3  PROT 5.8*  ALBUMIN 3.4*   No results for input(s): LIPASE, AMYLASE in the last 168 hours. No results for input(s): AMMONIA in the last 168 hours. CBC:  Recent Labs Lab 01/30/17 1521 01/31/17 0445 02/01/17 0535  WBC 6.4 3.3* 5.1  NEUTROABS 4.5  --  3.2  HGB 11.9* 10.9* 10.7*  HCT 36.5 34.9* 33.9*  MCV 91.5 92.8 93.6  PLT 224 176 180   Cardiac Enzymes:  Recent Labs Lab 01/30/17 1521  TROPONINI <0.03   BNP: Invalid input(s): POCBNP CBG: No results for input(s): GLUCAP in the last 168 hours.  Time coordinating discharge:  Greater than 30 minutes  Signed:  Zeric Baranowski, DO [facility] Pager: [phone] 02/01/2017, 1:04 PM

## 2017-02-01 NOTE — Progress Notes (Signed)
Andrea Jackson discharged Home per MD order.  Discharge instructions reviewed and discussed with the patient, all questions and concerns answered. Copy of instructions and scripts given to patient.  Allergies as of 02/01/2017      Reactions   Demerol [meperidine]    Patient stated she had a "reaction" to Demerol. Swollen lip.   Celecoxib    REACTION: hyper, couldn't eat or sleep   Codeine    REACTION: itching   Oxycodone-acetaminophen Itching   Statins Other (See Comments)   Muscle aches, can not tolerate any of them per patient.        Medication List    TAKE these medications   ALPRAZolam 0.5 MG tablet Commonly known as:  XANAX Take 0.5-1 tablets by mouth at bedtime as needed for sleep or anxiety (May take one-half tablet during the day if needed). For anxiety   aspirin EC 81 MG tablet Take 81 mg by mouth daily.   bisacodyl 5 MG EC tablet Commonly known as:  DULCOLAX Take 5 mg by mouth daily as needed for moderate constipation.   [employer] ADV PROBIOTIC GUMMIES PO Take by mouth daily. 1 gummy   diclofenac 75 MG EC tablet Commonly known as:  VOLTAREN Take 1 tablet by mouth 2 (two) times daily.   esomeprazole 40 MG capsule Commonly known as:  NEXIUM Take 1 capsule (40 mg total) by mouth 2 (two) times daily.   ferrous sulfate 325 (65 FE) MG tablet Take 325 mg by mouth daily with breakfast.   Fish Oil 1000 MG Caps Take 1 capsule by mouth 2 (two) times daily.   hydrochlorothiazide 12.5 MG capsule Commonly known as:  MICROZIDE Take 12.5 mg by mouth daily.   isosorbide mononitrate 30 MG 24 hr tablet Commonly known as:  IMDUR TAKE 1/2 TABLET BY MOUTH DAILY.   levothyroxine 137 MCG tablet Commonly known as:  SYNTHROID, LEVOTHROID Take 137 mcg by mouth daily.   lisinopril 40 MG tablet Commonly known as:  PRINIVIL,ZESTRIL Take 40 mg by mouth daily.   nitroGLYCERIN 0.4 MG SL tablet Commonly known as:  NITROSTAT Place 0.4 mg under the tongue as needed for chest pain.    polyethylene glycol packet Commonly known as:  MIRALAX / GLYCOLAX Take 17 g by mouth daily.   promethazine 25 MG tablet Commonly known as:  PHENERGAN Take 12.5-25 mg by mouth every 6 (six) hours as needed. Nausea,vomiting   RESTASIS 0.05 % ophthalmic emulsion Generic drug:  cycloSPORINE Place 1 drop into both eyes every evening.   traMADol 50 MG tablet Commonly known as:  ULTRAM Take 50 mg by mouth every 6 (six) hours as needed. For pain       Patients skin is clean, dry and intact, no evidence of skin break down. IV site discontinued and catheter remains intact. Site without signs and symptoms of complications. Dressing and pressure applied.  Patient escorted to car by NT in a wheelchair,  no distress noted upon discharge.  Andrea Jackson General 02/01/2017 6:43 PM

## 2017-02-01 NOTE — Progress Notes (Signed)
Subjective:  Patient has no appetite in past four weeks since hospitalized with SBO. First tried to drink apple juice this morning, and it came right back up. Has seen ate some eggs and coffee, feels unsettled. Witnessed patient taking her pills with water. Almost immediately starts with hiccup type motion but no vomiting. Feels pressure in epigastrium.   Objective: Vital signs in last 24 hours: Temp:  [98.1 F (36.7 C)-98.9 F (37.2 C)] 98.9 F (37.2 C) (05/23 0506) Pulse Rate:  [61-129] 63 (05/23 0506) Resp:  [12-21] 18 (05/23 0506) BP: (98-165)/(47-111) 130/65 (05/23 0506) SpO2:  [91 %-100 %] 95 % (05/23 0506) Last BM Date: 01/30/17 General:   Alert,  Well-developed, well-nourished, pleasant and cooperative in NAD Head:  Normocephalic and atraumatic. Eyes:  Sclera clear, no icterus.  Abdomen:  Soft, nontender and nondistended.  Normal bowel sounds, without guarding, and without rebound.   Extremities:  Without clubbing, deformity or edema. Neurologic:  Alert and  oriented x4;  grossly normal neurologically. Skin:  Intact without significant lesions or rashes. Psych:  Alert and cooperative. Normal mood and affect.  Intake/Output from previous day: No intake/output data recorded. Intake/Output this shift: No intake/output data recorded.  Lab Results: CBC  Recent Labs  01/30/17 1521 01/31/17 0445 02/01/17 0535  WBC 6.4 3.3* 5.1  HGB 11.9* 10.9* 10.7*  HCT 36.5 34.9* 33.9*  MCV 91.5 92.8 93.6  PLT 224 176 180   BMET  Recent Labs  01/30/17 1521 01/31/17 0445  NA 136 142  K 4.4 4.3  CL 102 110  CO2 28 27  GLUCOSE 106* 83  BUN 28* 19  CREATININE 1.06* 0.85  CALCIUM 8.9 8.3*   LFTs  Recent Labs  01/30/17 1521  BILITOT 0.3  ALKPHOS 42  AST 18  ALT 18  PROT 5.8*  ALBUMIN 3.4*   No results for input(s): LIPASE in the last 72 hours. PT/INR No results for input(s): LABPROT, INR in the last 72 hours.    Imaging Studies: Dg Chest 2 View  Result Date:  01/30/2017 CLINICAL DATA:  Shortness of breath, productive cough. EXAM: CHEST  2 VIEW COMPARISON:  Radiographs of July 07, 2015. FINDINGS: The heart size and mediastinal contours are within normal limits. Both lungs are clear. Atherosclerosis thoracic aorta is noted. No pneumothorax or pleural effusion is noted. The visualized skeletal structures are unremarkable. IMPRESSION: No active cardiopulmonary disease.  Aortic atherosclerosis. Electronically Signed   By: Marijo Conception, M.D.   On: 01/30/2017 15:55   Ct Angio Chest/abd/pel For Dissection W And/or Wo Contrast  Result Date: 01/30/2017 CLINICAL DATA:  83 y/o F; back pain, shortness of breath, productive cough. EXAM: CT ANGIOGRAPHY CHEST, ABDOMEN AND PELVIS TECHNIQUE: Multidetector CT imaging through the chest, abdomen and pelvis was performed using the standard protocol during bolus administration of intravenous contrast. Multiplanar reconstructed images and MIPs were obtained and reviewed to evaluate the vascular anatomy. CONTRAST:  100 cc Isovue 370 COMPARISON:  08/17/2015 CT abdomen and pelvis FINDINGS: CTA CHEST FINDINGS Cardiovascular: Preferential opacification of the thoracic aorta. No evidence of thoracic aortic aneurysm or dissection. Normal heart size. No pericardial effusion. Moderate coronary artery calcification. Mild calcific atherosclerosis of thoracic aorta. Mediastinum/Nodes: 10 mm nodule within the lower left pole of the thyroid. No mediastinal adenopathy. Lungs/Pleura: Lungs are clear. No pleural effusion or pneumothorax. Musculoskeletal: No chest wall abnormality. No acute or significant osseous findings. Review of the MIP images confirms the above findings. CTA ABDOMEN AND PELVIS FINDINGS VASCULAR Aorta: Normal caliber  without evidence for vasculitis or significant stenosis. Moderate tortuosity in the mid abdomen. Stable Left laterally directed focal dissection at the bifurcation measuring 19 mm in length (series 9, image 57).  Moderate calcified plaque. Celiac: Patent without evidence of aneurysm, dissection, vasculitis or significant stenosis. SMA: Mixed plaque with mild proximal stenosis. Renals: Accessory left renal artery. Renal arteries are patent without evidence of aneurysm, dissection, vasculitis, fibromuscular dysplasia or significant stenosis. IMA: Patent without evidence of aneurysm, dissection, vasculitis or significant stenosis. Inflow: Patent without evidence of aneurysm, dissection, vasculitis or significant stenosis. Veins: No obvious venous abnormality within the limitations of this arterial phase study. Review of the MIP images confirms the above findings. NON-VASCULAR Hepatobiliary: 5 mm hyperdense focus within segment 5 of the liver (series 5, image 121), likely flash hemangioma. Similar focus in segment 4A (series 5, image 99). No other focal liver abnormality identified. Status post cholecystectomy. Common bile duct measures 8 mm, probably compensatory post cholecystectomy. Pancreas: 5 x 2 mm lucency in head of pancreas, probably a side branch IPMN (series 5, image 115). No pancreatic ductal dilatation or surrounding inflammatory changes. Spleen: Nonspecific subcentimeter lucency in lateral aspect of spleen of unlikely significance, probably a cyst. Spleen is normal in size. Adrenals/Urinary Tract: Left kidney upper pole 26 mm cyst. No urinary stone disease or additional focal abnormality identified. Normal ureters. Normal bladder. Normal adrenal glands. Stomach/Bowel: Stomach is within normal limits. Appendectomy. Mild sigmoid diverticulosis without evidence for acute diverticulitis. No evidence of bowel wall thickening, distention, or inflammatory changes. Lymphatic: No lymphadenopathy identified. Reproductive: Status post hysterectomy. No adnexal masses. Other: No abdominal wall hernia or abnormality. No abdominopelvic ascites. Musculoskeletal: Moderate dextrocurvature of the lumbar spine with rightward apex at  L3. 10 mm rightward subluxation of L3 on L4. Review of the MIP images confirms the above findings. IMPRESSION: 1. Stable focal dissection at the abdominal aortic bifurcation measuring 19 mm in length, unlikely explanation for source of pain. Otherwise no acute vascular abnormality identified. 2. No acute process identified as explanation for pain. 3. Stable chronic changes as above. Electronically Signed   By: Kristine Garbe M.D.   On: 01/30/2017 17:36  [2 weeks]   Assessment: 83 year old female admitted with dysphagia to liquids, unable to tolerate water, which has been pronounced since SBO one month ago. Intermittent chronic dysphagia to bread. Notes nausea with food poor appetite. She admits to having eat quite slowly and has noted an aversion to food. Has most trouble keeping liquids down.      Other incidental findings include possible side branch IPMN noted on CT. Recommend MRI serial surveillance.   EGD yesterday with web in proximal esophagus s/p dilation, mild gastritis.    Plan: 1. Continue PPI.  2. Monitor closely today, suspect all of her symptoms not related to cervical esophageal web. To discuss further with Dr. Gala Romney.  3. F/u in 4-6 weeks in the office.  4. Consider Serial surveillance of abnormal pancreas as outpatient.   Laureen Ochs. Bernarda Caffey [facility] [phone] 5/23/20187:46 AM     LOS: 0 days

## 2017-02-01 NOTE — Telephone Encounter (Signed)
APPT MADE AND LETTER SENT TO PATIENT  °

## 2017-02-09 ENCOUNTER — Encounter (HOSPITAL_COMMUNITY): Payer: Self-pay | Admitting: Gastroenterology

## 2017-02-13 ENCOUNTER — Encounter (HOSPITAL_BASED_OUTPATIENT_CLINIC_OR_DEPARTMENT_OTHER): Payer: Self-pay | Admitting: *Deleted

## 2017-02-15 ENCOUNTER — Ambulatory Visit (INDEPENDENT_AMBULATORY_CARE_PROVIDER_SITE_OTHER): Payer: Medicare Other | Admitting: Gastroenterology

## 2017-02-15 ENCOUNTER — Encounter: Payer: Self-pay | Admitting: Gastroenterology

## 2017-02-15 ENCOUNTER — Other Ambulatory Visit: Payer: Self-pay | Admitting: Gastroenterology

## 2017-02-15 VITALS — BP 118/74 | HR 74 | Temp 98.2°F | Ht 64.0 in | Wt 124.4 lb

## 2017-02-15 DIAGNOSIS — K5909 Other constipation: Secondary | ICD-10-CM | POA: Diagnosis not present

## 2017-02-15 DIAGNOSIS — K869 Disease of pancreas, unspecified: Secondary | ICD-10-CM | POA: Diagnosis not present

## 2017-02-15 DIAGNOSIS — K219 Gastro-esophageal reflux disease without esophagitis: Secondary | ICD-10-CM | POA: Diagnosis not present

## 2017-02-15 DIAGNOSIS — D649 Anemia, unspecified: Secondary | ICD-10-CM | POA: Diagnosis not present

## 2017-02-15 DIAGNOSIS — R1310 Dysphagia, unspecified: Secondary | ICD-10-CM | POA: Diagnosis not present

## 2017-02-15 DIAGNOSIS — R1319 Other dysphagia: Secondary | ICD-10-CM

## 2017-02-15 DIAGNOSIS — D509 Iron deficiency anemia, unspecified: Secondary | ICD-10-CM

## 2017-02-15 LAB — CBC WITH DIFFERENTIAL/PLATELET
BASOS PCT: 1 %
Basophils Absolute: 63 cells/uL (ref 0–200)
EOS ABS: 126 {cells}/uL (ref 15–500)
Eosinophils Relative: 2 %
HEMATOCRIT: 37.9 % (ref 35.0–45.0)
HEMOGLOBIN: 11.8 g/dL (ref 11.7–15.5)
Lymphocytes Relative: 21 %
Lymphs Abs: 1323 cells/uL (ref 850–3900)
MCH: 28.7 pg (ref 27.0–33.0)
MCHC: 31.1 g/dL — ABNORMAL LOW (ref 32.0–36.0)
MCV: 92.2 fL (ref 80.0–100.0)
MONO ABS: 567 {cells}/uL (ref 200–950)
MPV: 10.1 fL (ref 7.5–12.5)
Monocytes Relative: 9 %
NEUTROS ABS: 4221 {cells}/uL (ref 1500–7800)
Neutrophils Relative %: 67 %
Platelets: 304 10*3/uL (ref 140–400)
RBC: 4.11 MIL/uL (ref 3.80–5.10)
RDW: 15.7 % — ABNORMAL HIGH (ref 11.0–15.0)
WBC: 6.3 10*3/uL (ref 3.8–10.8)

## 2017-02-15 MED ORDER — LUBIPROSTONE 8 MCG PO CAPS: 8.0000 ug | ORAL_CAPSULE | Freq: Two times a day (BID) | ORAL | 0 refills | Status: DC

## 2017-02-15 MED ORDER — PANTOPRAZOLE SODIUM 40 MG PO TBEC: 40.0000 mg | DELAYED_RELEASE_TABLET | Freq: Two times a day (BID) | ORAL | 3 refills | Status: DC

## 2017-02-15 NOTE — Patient Instructions (Addendum)
1. Stop generic Nexium. Start pantoprazole 40 mg twice daily, 30 minutes before a meal. Prescription sent to pharmacy. 2. Try Amitiza 8 g twice daily with food for constipation. Samples provided. We can adjust dose if needed, please let me know if you like a prescription. 3. Once her bowels are moving well on Amitiza, you can stop MiraLAX. 4. Please go to [facility]. for labs, we will follow up on anemia. 5. Return to the office in 4 weeks or sooner if needed. 6. Please show Dr. Quillian Quince CT scan see if they need to follow-up on "Stable focal dissection at the abdominal aortic bifurcation measuring 19 mm in length" 7. We will be following up on pancreas lesion in approximately six months.
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
## 2017-02-17 ENCOUNTER — Other Ambulatory Visit: Payer: Self-pay | Admitting: Otolaryngology

## 2017-02-17 NOTE — Progress Notes (Signed)
cc'ed to pcp °

## 2017-02-17 NOTE — Assessment & Plan Note (Signed)
Not doing well on generic Nexium twice a day. Switched pantoprazole 40 mg twice a day. She'll come back for office visit in 4-6 weeks for reevaluation. Call sooner if symptoms are worse. Reinforce antireflux measures.

## 2017-02-17 NOTE — Assessment & Plan Note (Signed)
Follow-up on hemoglobin at this time.

## 2017-02-17 NOTE — Assessment & Plan Note (Signed)
Abnormal CT recently. 5 mm x 2 mm lucency in the head of the pancreas, probable side branch IPMN. No pancreatic ductal dilation. We'll likely follow with either MRI or CT pancreatic protocol in 6 months. She discussed with Dr. Gala Romney.  She also had stable left lateral directed focal dissection at the bifurcation measuring 19 mm in length. I requested she follow up with PCP for ongoing surveillance/management.

## 2017-02-17 NOTE — Assessment & Plan Note (Signed)
Switch to low-dose Amitiza 8 g twice a day with food. Goal of discontinuing MiraLAX if constipation controlled on just a Amitiza.

## 2017-02-17 NOTE — Assessment & Plan Note (Signed)
Some improvement since dilation last month. Continues to have to really take her time eating and drinking, not quite back to baseline. However she does have refractory reflux and some of her symptoms may improve once we get this under control. Swallowing precautions discussed.

## 2017-02-20 ENCOUNTER — Ambulatory Visit (HOSPITAL_BASED_OUTPATIENT_CLINIC_OR_DEPARTMENT_OTHER): Payer: Medicare Other | Admitting: Anesthesiology

## 2017-02-20 ENCOUNTER — Encounter (HOSPITAL_BASED_OUTPATIENT_CLINIC_OR_DEPARTMENT_OTHER)
Admission: RE | Disposition: A | Discharge status: Home / Self Care | Payer: Self-pay | Source: Ambulatory Visit | Attending: Otolaryngology

## 2017-02-20 ENCOUNTER — Ambulatory Visit (HOSPITAL_BASED_OUTPATIENT_CLINIC_OR_DEPARTMENT_OTHER)
Admission: RE | Admit: 2017-02-20 | Discharge: 2017-02-20 | Disposition: A | Discharge status: Home / Self Care | Payer: Medicare Other | Source: Ambulatory Visit | Attending: Otolaryngology | Admitting: Otolaryngology

## 2017-02-20 ENCOUNTER — Encounter (HOSPITAL_BASED_OUTPATIENT_CLINIC_OR_DEPARTMENT_OTHER): Payer: Self-pay

## 2017-02-20 DIAGNOSIS — D2321 Other benign neoplasm of skin of right ear and external auricular canal: Secondary | ICD-10-CM | POA: Diagnosis not present

## 2017-02-20 DIAGNOSIS — I10 Essential (primary) hypertension: Secondary | ICD-10-CM | POA: Insufficient documentation

## 2017-02-20 DIAGNOSIS — M1990 Unspecified osteoarthritis, unspecified site: Secondary | ICD-10-CM | POA: Diagnosis not present

## 2017-02-20 DIAGNOSIS — E039 Hypothyroidism, unspecified: Secondary | ICD-10-CM | POA: Insufficient documentation

## 2017-02-20 DIAGNOSIS — Z9861 Coronary angioplasty status: Secondary | ICD-10-CM | POA: Insufficient documentation

## 2017-02-20 DIAGNOSIS — I252 Old myocardial infarction: Secondary | ICD-10-CM | POA: Insufficient documentation

## 2017-02-20 DIAGNOSIS — F419 Anxiety disorder, unspecified: Secondary | ICD-10-CM | POA: Insufficient documentation

## 2017-02-20 DIAGNOSIS — Z8711 Personal history of peptic ulcer disease: Secondary | ICD-10-CM | POA: Insufficient documentation

## 2017-02-20 DIAGNOSIS — K219 Gastro-esophageal reflux disease without esophagitis: Secondary | ICD-10-CM | POA: Diagnosis not present

## 2017-02-20 DIAGNOSIS — H608X1 Other otitis externa, right ear: Secondary | ICD-10-CM | POA: Diagnosis not present

## 2017-02-20 DIAGNOSIS — L989 Disorder of the skin and subcutaneous tissue, unspecified: Secondary | ICD-10-CM | POA: Diagnosis not present

## 2017-02-20 DIAGNOSIS — H61101 Unspecified noninfective disorders of pinna, right ear: Secondary | ICD-10-CM | POA: Insufficient documentation

## 2017-02-20 DIAGNOSIS — I2510 Atherosclerotic heart disease of native coronary artery without angina pectoris: Secondary | ICD-10-CM | POA: Insufficient documentation

## 2017-02-20 DIAGNOSIS — E782 Mixed hyperlipidemia: Secondary | ICD-10-CM | POA: Diagnosis not present

## 2017-02-20 DIAGNOSIS — L570 Actinic keratosis: Secondary | ICD-10-CM | POA: Insufficient documentation

## 2017-02-20 HISTORY — PX: MASS EXCISION: SHX2000

## 2017-02-20 HISTORY — PX: SKIN FULL THICKNESS GRAFT: SHX442

## 2017-02-20 SURGERY — EXCISION MASS
Anesthesia: General | Site: Flank | Laterality: Right

## 2017-02-20 MED ORDER — AMOXICILLIN 875 MG PO TABS: 875.0000 mg | ORAL_TABLET | Freq: Two times a day (BID) | ORAL | 0 refills | Status: DC

## 2017-02-20 MED ORDER — EPHEDRINE 5 MG/ML INJ
INTRAVENOUS | Status: AC
  Filled 2017-02-20: qty 10

## 2017-02-20 MED ORDER — PROPOFOL 10 MG/ML IV BOLUS
INTRAVENOUS | Status: DC | PRN
  Administered 2017-02-20: 120 mg via INTRAVENOUS

## 2017-02-20 MED ORDER — LIDOCAINE-EPINEPHRINE 1 %-1:100000 IJ SOLN
INTRAMUSCULAR | Status: DC | PRN
  Administered 2017-02-20: 3.5 mL

## 2017-02-20 MED ORDER — MIDAZOLAM HCL 2 MG/2ML IJ SOLN: 1.0000 mg | INTRAMUSCULAR | Status: DC | PRN

## 2017-02-20 MED ORDER — ONDANSETRON HCL 4 MG/2ML IJ SOLN
INTRAMUSCULAR | Status: DC | PRN
  Administered 2017-02-20: 4 mg via INTRAVENOUS

## 2017-02-20 MED ORDER — TRAMADOL HCL 50 MG PO TABS
50.0000 mg | ORAL_TABLET | Freq: Four times a day (QID) | ORAL | Status: DC | PRN
  Administered 2017-02-20: 50 mg via ORAL

## 2017-02-20 MED ORDER — PHENYLEPHRINE HCL 10 MG/ML IJ SOLN: INTRAMUSCULAR | Status: DC | PRN

## 2017-02-20 MED ORDER — FENTANYL CITRATE (PF) 100 MCG/2ML IJ SOLN
50.0000 ug | INTRAMUSCULAR | Status: DC | PRN
  Administered 2017-02-20: 50 ug via INTRAVENOUS

## 2017-02-20 MED ORDER — PROPOFOL 10 MG/ML IV BOLUS
INTRAVENOUS | Status: AC
  Filled 2017-02-20: qty 20

## 2017-02-20 MED ORDER — EPHEDRINE SULFATE 50 MG/ML IJ SOLN
INTRAMUSCULAR | Status: DC | PRN
  Administered 2017-02-20: 10 mg via INTRAVENOUS
  Administered 2017-02-20 (×2): 5 mg via INTRAVENOUS

## 2017-02-20 MED ORDER — FENTANYL CITRATE (PF) 100 MCG/2ML IJ SOLN
INTRAMUSCULAR | Status: AC
  Filled 2017-02-20: qty 2

## 2017-02-20 MED ORDER — SCOPOLAMINE 1 MG/3DAYS TD PT72: 1.0000 | MEDICATED_PATCH | Freq: Once | TRANSDERMAL | Status: DC | PRN

## 2017-02-20 MED ORDER — ONDANSETRON HCL 4 MG/2ML IJ SOLN: 4.0000 mg | Freq: Four times a day (QID) | INTRAMUSCULAR | Status: DC | PRN

## 2017-02-20 MED ORDER — FENTANYL CITRATE (PF) 100 MCG/2ML IJ SOLN: 25.0000 ug | INTRAMUSCULAR | Status: DC | PRN

## 2017-02-20 MED ORDER — TRAMADOL HCL 50 MG PO TABS
ORAL_TABLET | ORAL | Status: AC
  Filled 2017-02-20: qty 1

## 2017-02-20 MED ORDER — CEFAZOLIN SODIUM-DEXTROSE 2-3 GM-% IV SOLR
INTRAVENOUS | Status: DC | PRN
  Administered 2017-02-20: 2 g via INTRAVENOUS

## 2017-02-20 MED ORDER — LACTATED RINGERS IV SOLN
INTRAVENOUS | Status: DC
  Administered 2017-02-20 (×2): via INTRAVENOUS

## 2017-02-20 MED ORDER — LIDOCAINE 2% (20 MG/ML) 5 ML SYRINGE
INTRAMUSCULAR | Status: DC | PRN
  Administered 2017-02-20: 50 mg via INTRAVENOUS

## 2017-02-20 SURGICAL SUPPLY — 98 items
ADH SKN CLS APL DERMABOND .7 (GAUZE/BANDAGES/DRESSINGS) ×3
APL SKNCLS STERI-STRIP NONHPOA (GAUZE/BANDAGES/DRESSINGS)
ATTRACTOMAT 16X20 MAGNETIC DRP (DRAPES) IMPLANT
BAG DECANTER FOR FLEXI CONT (MISCELLANEOUS) IMPLANT
BALL CTTN LRG ABS STRL LF (GAUZE/BANDAGES/DRESSINGS)
BENZOIN TINCTURE PRP APPL 2/3 (GAUZE/BANDAGES/DRESSINGS) IMPLANT
BLADE CLIPPER SURG (BLADE) IMPLANT
BLADE DERMATOME SS (BLADE) IMPLANT
BLADE SURG 10 STRL SS (BLADE) IMPLANT
BLADE SURG 15 STRL LF DISP TIS (BLADE) ×3 IMPLANT
BLADE SURG 15 STRL SS (BLADE) ×5
BNDG GAUZE ELAST 4 BULKY (GAUZE/BANDAGES/DRESSINGS) IMPLANT
CANISTER SUCT 1200ML W/VALVE (MISCELLANEOUS) ×2 IMPLANT
CLOSURE WOUND 1/2 X4 (GAUZE/BANDAGES/DRESSINGS)
CORDS BIPOLAR (ELECTRODE) IMPLANT
COTTONBALL LRG STERILE PKG (GAUZE/BANDAGES/DRESSINGS) IMPLANT
COVER BACK TABLE 60X90IN (DRAPES) ×5 IMPLANT
COVER MAYO STAND STRL (DRAPES) ×5 IMPLANT
DECANTER SPIKE VIAL GLASS SM (MISCELLANEOUS) IMPLANT
DERMABOND ADVANCED (GAUZE/BANDAGES/DRESSINGS) ×2
DERMABOND ADVANCED .7 DNX12 (GAUZE/BANDAGES/DRESSINGS) ×3 IMPLANT
DERMACARRIERS GRAFT 1 TO 1.5 (DISPOSABLE)
DRAIN JACKSON RD 7FR 3/32 (WOUND CARE) IMPLANT
DRAIN PENROSE 1/4X12 LTX STRL (WOUND CARE) IMPLANT
DRAIN TLS ROUND 10FR (DRAIN) IMPLANT
DRAPE INCISE IOBAN 66X45 STRL (DRAPES) IMPLANT
DRAPE LAPAROTOMY 100X72 PEDS (DRAPES) IMPLANT
DRAPE SURG 17X23 STRL (DRAPES) IMPLANT
DRAPE U-SHAPE 76X120 STRL (DRAPES) ×5 IMPLANT
DRSG ADAPTIC 3X8 NADH LF (GAUZE/BANDAGES/DRESSINGS) IMPLANT
DRSG EMULSION OIL 3X3 NADH (GAUZE/BANDAGES/DRESSINGS) IMPLANT
DRSG OPSITE 6X11 MED (GAUZE/BANDAGES/DRESSINGS) IMPLANT
DRSG PAD ABDOMINAL 8X10 ST (GAUZE/BANDAGES/DRESSINGS) IMPLANT
DRSG TEGADERM 4X10 (GAUZE/BANDAGES/DRESSINGS) IMPLANT
ELECT COATED BLADE 2.86 ST (ELECTRODE) ×2 IMPLANT
ELECT NDL BLADE 2-5/6 (NEEDLE) IMPLANT
ELECT NEEDLE BLADE 2-5/6 (NEEDLE) ×5 IMPLANT
ELECT PAIRED SUBDERMAL (MISCELLANEOUS)
ELECT REM PT RETURN 9FT ADLT (ELECTROSURGICAL) ×5
ELECTRODE PAIRED SUBDERMAL (MISCELLANEOUS) IMPLANT
ELECTRODE REM PT RTRN 9FT ADLT (ELECTROSURGICAL) ×3 IMPLANT
EVACUATOR SILICONE 100CC (DRAIN) IMPLANT
FORCEPS BIPOLAR SPETZLER 8 1.0 (NEUROSURGERY SUPPLIES) IMPLANT
GAUZE SPONGE 4X4 12PLY STRL (GAUZE/BANDAGES/DRESSINGS) ×2 IMPLANT
GAUZE SPONGE 4X4 12PLY STRL LF (GAUZE/BANDAGES/DRESSINGS) IMPLANT
GAUZE SPONGE 4X4 16PLY XRAY LF (GAUZE/BANDAGES/DRESSINGS) IMPLANT
GAUZE XEROFORM 5X9 LF (GAUZE/BANDAGES/DRESSINGS) ×3 IMPLANT
GLOVE BIO SURGEON STRL SZ7.5 (GLOVE) ×5 IMPLANT
GLOVE SURG SS PI 6.5 STRL IVOR (GLOVE) ×3 IMPLANT
GOWN STRL REUS W/ TWL LRG LVL3 (GOWN DISPOSABLE) ×6 IMPLANT
GOWN STRL REUS W/TWL LRG LVL3 (GOWN DISPOSABLE) ×10
GRAFT DERMACARRIERS 1 TO 1.5 (DISPOSABLE) IMPLANT
HEMOSTAT SURGICEL 2X14 (HEMOSTASIS) IMPLANT
LOCATOR NERVE 3 VOLT (DISPOSABLE) IMPLANT
NDL HYPO 25X1 1.5 SAFETY (NEEDLE) ×2 IMPLANT
NDL PRECISIONGLIDE 27X1.5 (NEEDLE) ×2 IMPLANT
NEEDLE HYPO 25X1 1.5 SAFETY (NEEDLE) ×5 IMPLANT
NEEDLE PRECISIONGLIDE 27X1.5 (NEEDLE) IMPLANT
NS IRRIG 1000ML POUR BTL (IV SOLUTION) ×5 IMPLANT
PACK BASIN DAY SURGERY FS (CUSTOM PROCEDURE TRAY) ×5 IMPLANT
PENCIL BUTTON HOLSTER BLD 10FT (ELECTRODE) ×5 IMPLANT
PIN SAFETY STERILE (MISCELLANEOUS) IMPLANT
PROBE NERVBE PRASS .33 (MISCELLANEOUS) IMPLANT
SHEARS HARMONIC 9CM CVD (BLADE) IMPLANT
SHEET MEDIUM DRAPE 40X70 STRL (DRAPES) IMPLANT
SLEEVE SCD COMPRESS KNEE MED (MISCELLANEOUS) ×3 IMPLANT
SPONGE GAUZE 2X2 8PLY STER LF (GAUZE/BANDAGES/DRESSINGS)
SPONGE GAUZE 2X2 8PLY STRL LF (GAUZE/BANDAGES/DRESSINGS) IMPLANT
SPONGE LAP 18X18 X RAY DECT (DISPOSABLE) ×2 IMPLANT
STAPLER VISISTAT 35W (STAPLE) IMPLANT
STRIP CLOSURE SKIN 1/2X4 (GAUZE/BANDAGES/DRESSINGS) IMPLANT
SUCTION FRAZIER HANDLE 10FR (MISCELLANEOUS)
SUCTION TUBE FRAZIER 10FR DISP (MISCELLANEOUS) IMPLANT
SURGILUBE 2OZ TUBE FLIPTOP (MISCELLANEOUS) IMPLANT
SUT ETHILON 3 0 PS 1 (SUTURE) IMPLANT
SUT ETHILON 5 0 P 3 18 (SUTURE)
SUT MON AB 5-0 PS2 18 (SUTURE) IMPLANT
SUT NYLON ETHILON 5-0 P-3 1X18 (SUTURE) IMPLANT
SUT PLAIN 5 0 P 3 18 (SUTURE) ×3 IMPLANT
SUT PROLENE 4 0 P 3 18 (SUTURE) IMPLANT
SUT SILK 3 0 SH CR/8 (SUTURE) IMPLANT
SUT SILK 3 0 TIES 17X18 (SUTURE)
SUT SILK 3-0 18XBRD TIE BLK (SUTURE) IMPLANT
SUT SILK 4 0 TIES 17X18 (SUTURE) IMPLANT
SUT VIC AB 3-0 FS2 27 (SUTURE) IMPLANT
SUT VIC AB 4-0 P-3 18XBRD (SUTURE) IMPLANT
SUT VIC AB 4-0 P3 18 (SUTURE)
SUT VIC AB 5-0 P-3 18X BRD (SUTURE) IMPLANT
SUT VIC AB 5-0 P3 18 (SUTURE)
SUT VICRYL 4-0 PS2 18IN ABS (SUTURE) ×5 IMPLANT
SYR BULB 3OZ (MISCELLANEOUS) ×2 IMPLANT
SYR CONTROL 10ML LL (SYRINGE) ×5 IMPLANT
TOWEL OR 17X24 6PK STRL BLUE (TOWEL DISPOSABLE) ×8 IMPLANT
TRAY DSU PREP LF (CUSTOM PROCEDURE TRAY) ×5 IMPLANT
TUBE CONNECTING 20'X1/4 (TUBING)
TUBE CONNECTING 20X1/4 (TUBING) ×2 IMPLANT
UNDERPAD 30X30 (UNDERPADS AND DIAPERS) ×3 IMPLANT
YANKAUER SUCT BULB TIP NO VENT (SUCTIONS) IMPLANT

## 2017-02-20 NOTE — Anesthesia Procedure Notes (Signed)
Procedure Name: LMA Insertion Date/Time: 02/20/2017 10:47 AM Performed by: Lieutenant Diego Pre-anesthesia Checklist: Patient identified, Emergency Drugs available, Suction available and Patient being monitored Patient Re-evaluated:Patient Re-evaluated prior to inductionOxygen Delivery Method: Circle system utilized Preoxygenation: Pre-oxygenation with 100% oxygen Intubation Type: IV induction Ventilation: Mask ventilation without difficulty LMA: LMA inserted LMA Size: 4.0 Number of attempts: 1 Airway Equipment and Method: Bite block Placement Confirmation: positive ETCO2 and breath sounds checked- equal and bilateral Tube secured with: Tape Dental Injury: Teeth and Oropharynx as per pre-operative assessment

## 2017-02-20 NOTE — Transfer of Care (Signed)
Immediate Anesthesia Transfer of Care Note  Patient: Andrea Jackson  Procedure(s) Performed: Procedure(s): EXCISION RIGHT AURICULAR LESION (Right) SKIN GRAFT FULL THICKNESS TO RIGHT EAR (Left)  Patient Location: PACU  Anesthesia Type:General  Level of Consciousness: awake  Airway & Oxygen Therapy: Patient Spontanous Breathing and Patient connected to face mask oxygen  Post-op Assessment: Report given to RN and Post -op Vital signs reviewed and stable  Post vital signs: Reviewed and stable  Last Vitals:  Vitals:   02/20/17 1145 02/20/17 1146  BP: (!) 122/56   Pulse:  74  Resp:  10  Temp:  (P) 36.5 C    Last Pain:  Vitals:   02/20/17 0936  TempSrc: Oral         Complications: No apparent anesthesia complications

## 2017-02-20 NOTE — Op Note (Signed)
DATE OF PROCEDURE: 02/20/2017  OPERATIVE REPORT   SURGEON: Leta Baptist, MD  PREOPERATIVE DIAGNOSIS: Right auricular lesion  POSTOPERATIVE DIAGNOSIS: Right auricular lesion  PROCEDURES PERFORMED: 1. Excision of right auricular lesion (2cm) 2. Full thickness skin graft from the abdomen (4 cm2)  ANESTHESIA: General laryngeal mask anesthesia.  COMPLICATIONS: None.  ESTIMATED BLOOD LOSS: Minimal.  INDICATION FOR PROCEDURE:  Andrea Jackson is a 83 y.o. female with an enlarging right auricular lesion. The patient was initially noted to have a 5 mm crusty lesion over the right auricular anti-helix. She underwent biopsy of the auricular lesion. The pathology was consistent with actinic keratosis. The lesion has subsequently increased in size (1.2cm). The patient has been complaining of increasing pain at the lesion site. The patient would like to have the lesion removed. Based on the above findings, the decision was made for the patient to undergo the above-stated procedures. The risks, benefits, alternatives, and details of the procedures were discussed with the patient. Questions were invited and answered. Informed consent was obtained.  DESCRIPTION OF PROCEDURE: The patient was taken to the operating room and placed supine on the operating table. General laryngeal mask anesthesia was induced by the anesthesiologist.  A crusty lesion was noted at the right auricular antihelix. 1% lidocaine with 1-100,000 epinephrine was infiltrated around the lesion. A 2 cm elliptical incision was made around the lesion. The incision was carried down to the level of the perichondrium. The entire lesion was removed. The specimen was sent to the pathology department for frozen section analysis. No malignancy was noted.  Attention was then focused on the full-thickness skin graft. 1% lidocaine with 1-100,000 epinephrine was infiltrated at the left lower quadrant of the abdomen. A 2 x 2 centimeter  full-thickness skin graft was harvested in a standard fashion. The surgical site was copiously irrigated. The incision was closed in layers with 4-0 Vicryl and Dermabond.   The skin graft was used to cover the surgical defect on the right auricle. The skin graft was sutured with interrupted 5-0 plain gut sutures. A bolster was then applied.  The care of the patient was turned over to the anesthesiologist. The patient was awakened from anesthesia without difficulty. She was extubated and transferred to the recovery room in good condition.  OPERATIVE FINDINGS: A benign right auricular lesion.  SPECIMEN:Right auricular lesion.  FOLLOWUP CARE: The patient will be discharged home once she is awake and alert. She will follow up in my office in 1 week.

## 2017-02-20 NOTE — Anesthesia Preprocedure Evaluation (Signed)
Anesthesia Evaluation  Patient identified by MRN, date of birth, ID band Patient awake    Reviewed: Allergy & Precautions, H&P , NPO status , Patient's Chart, lab work & pertinent test results  Airway Mallampati: II   Neck ROM: full    Dental   Pulmonary neg pulmonary ROS,    breath sounds clear to auscultation       Cardiovascular hypertension, + CAD and + Past MI   Rhythm:regular Rate:Normal  S/p PTCA (2010).   Stress test (2016) no ischemia. Good LV function.   Neuro/Psych PSYCHIATRIC DISORDERS Anxiety    GI/Hepatic PUD, GERD  ,  Endo/Other  Hypothyroidism   Renal/GU      Musculoskeletal  (+) Arthritis ,   Abdominal   Peds  Hematology   Anesthesia Other Findings   Reproductive/Obstetrics                             Anesthesia Physical Anesthesia Plan  ASA: III  Anesthesia Plan: General   Post-op Pain Management:    Induction: Intravenous  PONV Risk Score and Plan: 4 or greater and Ondansetron, Dexamethasone, Propofol and Midazolam  Airway Management Planned: Oral ETT  Additional Equipment:   Intra-op Plan:   Post-operative Plan: Extubation in OR  Informed Consent: I have reviewed the patients History and Physical, chart, labs and discussed the procedure including the risks, benefits and alternatives for the proposed anesthesia with the patient or authorized representative who has indicated his/her understanding and acceptance.     Plan Discussed with: CRNA, Anesthesiologist and Surgeon  Anesthesia Plan Comments:         Anesthesia Quick Evaluation

## 2017-02-20 NOTE — Discharge Instructions (Signed)
°  Post Anesthesia Home Care Instructions  Activity: Get plenty of rest for the remainder of the day. A responsible individual must stay with you for 24 hours following the procedure.  For the next 24 hours, DO NOT: -Drive a car -Paediatric nurse -Drink alcoholic beverages -Take any medication unless instructed by your physician -Make any legal decisions or sign important papers.  Meals: Start with liquid foods such as gelatin or soup. Progress to regular foods as tolerated. Avoid greasy, spicy, heavy foods. If nausea and/or vomiting occur, drink only clear liquids until the nausea and/or vomiting subsides. Call your physician if vomiting continues.  Special Instructions/Symptoms: Your throat may feel dry or sore from the anesthesia or the breathing tube placed in your throat during surgery. If this causes discomfort, gargle with warm salt water. The discomfort should disappear within 24 hours.  If you had a scopolamine patch placed behind your ear for the management of post- operative nausea and/or vomiting:  1. The medication in the patch is effective for 72 hours, after which it should be removed.  Wrap patch in a tissue and discard in the trash. Wash hands thoroughly with soap and water. 2. You may remove the patch earlier than 72 hours if you experience unpleasant side effects which may include dry mouth, dizziness or visual disturbances. 3. Avoid touching the patch. Wash your hands with soap and water after contact with the patch.  --------------  The patient may resume all her previous activities and diet. She will follow-up in my office in one week.

## 2017-02-20 NOTE — H&P (Signed)
Cc: Right auricular lesion  HPI: Andrea Jackson is a 83 year old female who returns today for her follow-up evaluation. She was last seen 6 months ago.  She was previously noted to have a 5 mm crusty lesion over her right auricular antihelix. She underwent biopsy of Andrea auricular lesion. Andrea pathology was consistent with actinic keratosis. Andrea Jackson returns complaining of increasing pain at Andrea site with pressure. Andrea lesion has enlarged slightly since her last visit but Andrea Jackson has been applying daily ointment and trimming Andrea lesion at home. She denies any otorrhea, hearing difficulty or drainage. No other ENT, GI, or respiratory issue noted since Andrea last visit.   Exam Head: Normocephalic, no evidence injury, no tenderness, facial buttresses intact without stepoff. General: Communicates without difficulty, well nourished, no acute distress. Head: Normocephalic, no evidence injury, no tenderness, facial buttresses intact without stepoff. Eyes: PERRL, EOMI. No scleral icterus, conjunctivae clear. Neuro: CN II exam reveals vision grossly intact. No nystagmus at any point of gaze. Ears: Small area of crusting at right auricular antihelix. Ear canals are intact without mass or lesion. No erythema or edema is appreciated. Andrea TMs are intact without fluid. Dorsum is intact. Dorsum is intact. Anterior rhinoscopy reveals healthy pink mucosa over anterior aspect of inferior turbinates and intact septum. No purulence noted. Oral:  Oral cavity and oropharynx are intact, symmetric, without erythema or edema. Mucosa is moist without lesions. Neck: Full range of motion without pain. There is no significant lymphadenopathy. No masses palpable. Thyroid bed within normal limits to palpation. Parotid glands and submandibular glands equal bilaterally without mass. Trachea is midline. Neuro:  CN 2-12 grossly intact. Gait normal.   Assessment 1. Right auricular lesion. Andrea pathology was consistent with actinic keratosis,  which is a precancerous lesion. Andrea area has increased in size and is causing Andrea Jackson more symptoms.   Plan  1. Andrea physical exam findings are reviewed with Andrea Jackson.  2. Treatment options include continued conservative observation versus excision. Andrea risks, benefits, alternatives, and details of Andrea procedure are reviewed with Andrea Jackson. Questions are invited and answered. 3. Andrea Jackson is interested in proceeding with Andrea procedure.  We will schedule Andrea procedure in accordance with Andrea Jackson's schedule.

## 2017-02-20 NOTE — Anesthesia Postprocedure Evaluation (Signed)
Anesthesia Post Note  Patient: Andrea Jackson  Procedure(s) Performed: Procedure(s) (LRB): EXCISION RIGHT AURICULAR LESION (Right) SKIN GRAFT FULL THICKNESS TO RIGHT EAR (Left)     Patient location during evaluation: PACU Anesthesia Type: General Level of consciousness: awake and alert and patient cooperative Pain management: pain level controlled Vital Signs Assessment: post-procedure vital signs reviewed and stable Respiratory status: spontaneous breathing and respiratory function stable Cardiovascular status: stable Anesthetic complications: no    Last Vitals:  Vitals:   02/20/17 1215 02/20/17 1230  BP: 122/66 118/65  Pulse: 65 66  Resp: 20 16  Temp:      Last Pain:  Vitals:   02/20/17 1215  TempSrc:   PainSc: 0-No pain                 Jorryn Casagrande S

## 2017-02-21 ENCOUNTER — Encounter (HOSPITAL_BASED_OUTPATIENT_CLINIC_OR_DEPARTMENT_OTHER): Payer: Self-pay | Admitting: Otolaryngology

## 2017-02-22 NOTE — Progress Notes (Signed)
Please let patient know Hgb now normal. Plan as outlined at time of ov.

## 2017-02-24 DIAGNOSIS — M810 Age-related osteoporosis without current pathological fracture: Secondary | ICD-10-CM | POA: Diagnosis not present

## 2017-02-24 DIAGNOSIS — I10 Essential (primary) hypertension: Secondary | ICD-10-CM | POA: Diagnosis not present

## 2017-02-24 DIAGNOSIS — E039 Hypothyroidism, unspecified: Secondary | ICD-10-CM | POA: Diagnosis not present

## 2017-02-24 DIAGNOSIS — Z1389 Encounter for screening for other disorder: Secondary | ICD-10-CM | POA: Diagnosis not present

## 2017-02-24 DIAGNOSIS — E782 Mixed hyperlipidemia: Secondary | ICD-10-CM | POA: Diagnosis not present

## 2017-02-24 DIAGNOSIS — M1711 Unilateral primary osteoarthritis, right knee: Secondary | ICD-10-CM | POA: Diagnosis not present

## 2017-02-27 ENCOUNTER — Ambulatory Visit (INDEPENDENT_AMBULATORY_CARE_PROVIDER_SITE_OTHER): Payer: Medicare Other | Admitting: Otolaryngology

## 2017-03-22 ENCOUNTER — Ambulatory Visit (INDEPENDENT_AMBULATORY_CARE_PROVIDER_SITE_OTHER): Payer: Medicare Other | Admitting: Gastroenterology

## 2017-03-22 ENCOUNTER — Encounter: Payer: Self-pay | Admitting: Gastroenterology

## 2017-03-22 VITALS — BP 135/76 | HR 61 | Temp 97.0°F | Ht 64.0 in | Wt 124.0 lb

## 2017-03-22 DIAGNOSIS — K5909 Other constipation: Secondary | ICD-10-CM

## 2017-03-22 DIAGNOSIS — R1310 Dysphagia, unspecified: Secondary | ICD-10-CM

## 2017-03-22 DIAGNOSIS — K219 Gastro-esophageal reflux disease without esophagitis: Secondary | ICD-10-CM

## 2017-03-22 DIAGNOSIS — R1319 Other dysphagia: Secondary | ICD-10-CM

## 2017-03-22 DIAGNOSIS — K869 Disease of pancreas, unspecified: Secondary | ICD-10-CM

## 2017-03-22 NOTE — Assessment & Plan Note (Signed)
Much improved with better control of her reflux. Status post dilation in May. Nearly 100% improvement of her swallowing issues at this point.

## 2017-03-22 NOTE — Progress Notes (Signed)
Primary Care Physician: Caryl Bis, MD  Primary Gastroenterologist:  Garfield Cornea, MD   Chief Complaint  Patient presents with  . Gastroesophageal Reflux    f/u, some better    HPI: Andrea Jackson is a 83 y.o. female here for 1 month follow-up. She is last seen June 6 for hospital follow-up. Earlier this spring she had a small bowel obstruction, admitted at [facility]. When she was admitted at [facility] she was having dysphagia, EGD on 01/31/2017 showed proximal esophageal with which was dilated, mild gastritis.  Last office visit she was showing improvement in her dysphagia but not 100%. Also was having refractory reflux symptoms and constipation. She was given Amitiza and switched pantoprazole. She states that she is no longer on any Amitiza. Bowel function seems to be fine. Went back to taking MiraLAX every single day. PCP had tried to change her and her reflux medication and add additional meds. She said actually she started to have improvement in her reflux when she went back to Nexium 40 mg twice a day and over the last week she's been able to drop down to once daily dosing  CTA abdomen neck and may showed a 5 X 2 mm lucency in the head of the pancreas, probably a sidebranch IPM in. No pancreatic ductal dilation or surrounding inflammation.    Prior GI history Patient has a history of IDA, heme positive stools EGD and colonoscopy in 2016, baggy esophagus and nodular inflamed antrum with biopsies consistent with reactive gastropathy. Colonoscopy with internal hemorrhoids, scattered pancolonic diverticula. She has a history of small bowel obstruction, chronic pain, chronic constipation, IDA. Small bowel ulcers on capsule study in 2007, with [employer] panel suggesting Crohn's. She was not treated for Crohn's. CT enterography in March 2014 showed improvement in mild wall thickening that involved a portion of the mid small bowel that was proximal to the prior  surgical anastomosis.   Low suspicion for Crohn's disease per [facility] (Dr. Delrae Alfred).  g with adequate control. He denies abdominal pain. Her swallowing is almost 100% normal. She is pleased with her progress. Denies blood in the stool or melena.    Current Outpatient Prescriptions  Medication Sig Dispense Refill  . ALPRAZolam (XANAX) 0.5 MG tablet Take 0.5-1 tablets by mouth at bedtime as needed for sleep or anxiety (May take one-half tablet during the day if needed). For anxiety    . aspirin EC 81 MG tablet Take 81 mg by mouth daily.    . bisacodyl (DULCOLAX) 5 MG EC tablet Take 5 mg by mouth daily as needed for moderate constipation.    . diclofenac (VOLTAREN) 75 MG EC tablet Take 1 tablet by mouth daily.     Marland Kitchen esomeprazole (NEXIUM) 40 MG capsule Take 40 mg by mouth daily before breakfast.    . ferrous sulfate 325 (65 FE) MG tablet Take 325 mg by mouth daily with breakfast.     . hydrochlorothiazide (MICROZIDE) 12.5 MG capsule Take 12.5 mg by mouth daily.  1  . isosorbide mononitrate (IMDUR) 30 MG 24 hr tablet TAKE 1/2 TABLET BY MOUTH DAILY. 45 tablet 0  . levothyroxine (SYNTHROID, LEVOTHROID) 137 MCG tablet Take 137 mcg by mouth daily.    Marland Kitchen losartan (COZAAR) 100 MG tablet Take 100 mg by mouth daily. for high blood pressure  3  . nitroGLYCERIN (NITROSTAT) 0.4 MG SL tablet Place 0.4 mg under the tongue as needed for chest pain.     Marland Kitchen  Omega-3 Fatty Acids (FISH OIL) 1000 MG CAPS Take 1 capsule by mouth daily. Doesn't take everyday    . polyethylene glycol (MIRALAX / GLYCOLAX) packet Take 17 g by mouth daily as needed.     . Probiotic Product ([employer] ADV PROBIOTIC GUMMIES PO) Take by mouth daily. 1 gummy    . promethazine (PHENERGAN) 25 MG tablet Take 25 mg by mouth every 6 (six) hours as needed for nausea or vomiting.    . RESTASIS 0.05 % ophthalmic emulsion Place 1 drop into both eyes every evening.     . traMADol (ULTRAM) 50 MG tablet Take 50 mg by mouth every 6 (six) hours as needed. For pain      No current facility-administered medications for this visit.     Allergies as of 03/22/2017 - Review Complete 03/22/2017  Allergen Reaction Noted  . Demerol [meperidine]  01/31/2017  . Celecoxib    . Codeine    . Oxycodone-acetaminophen Itching 12/15/2010  . Statins Other (See Comments) 02/20/2013    ROS:  General: Negative for anorexia, weight loss, fever, chills, fatigue, weakness. ENT: Negative for hoarseness, difficulty swallowing , nasal congestion. CV: Negative for chest pain, angina, palpitations, dyspnea on exertion, peripheral edema.  Respiratory: Negative for dyspnea at rest, dyspnea on exertion, cough, sputum, wheezing.  GI: See history of present illness. GU:  Negative for dysuria, hematuria, urinary incontinence, urinary frequency, nocturnal urination.  Endo: Negative for unusual weight change.    Physical Examination:   BP 135/76   Pulse 61   Temp (!) 97 F (36.1 C) (Oral)   Ht 5\' 4"  (1.626 m)   Wt 124 lb (56.2 kg)   BMI 21.28 kg/m   General: Well-nourished, well-developed in no acute distress.  Eyes: No icterus. Mouth: Oropharyngeal mucosa moist and pink , no lesions erythema or exudate. Lungs: Clear to auscultation bilaterally.  Heart: Regular rate and rhythm, no murmurs rubs or gallops.  Abdomen: Bowel sounds are normal, nontender, nondistended, no hepatosplenomegaly or masses, no abdominal bruits or hernia , no rebound or guarding.   Extremities: No lower extremity edema. No clubbing or deformities. Neuro: Alert and oriented x 4   Skin: Warm and dry, no jaundice.   Psych: Alert and cooperative, normal mood and affect.  Labs:  Lab Results  Component Value Date   CREATININE 0.92 02/01/2017   BUN 17 02/01/2017   NA 141 02/01/2017   K 4.3 02/01/2017   CL 108 02/01/2017   CO2 26 02/01/2017   Lab Results  Component Value Date   ALT 18 01/30/2017   AST 18 01/30/2017   ALKPHOS 42 01/30/2017   BILITOT 0.3 01/30/2017   Lab Results  Component  Value Date   WBC 6.3 02/15/2017   HGB 11.8 02/15/2017   HCT 37.9 02/15/2017   MCV 92.2 02/15/2017   PLT 304 02/15/2017    Imaging Studies: No results found.

## 2017-03-22 NOTE — Assessment & Plan Note (Signed)
59mm X 25mm lucency in the head of the pancreas, ?branch IPMN per radiologist. Consider reimaging in 07/2017 vs MRI/MRCP now. To discuss with Dr. Gala Romney.   PATIENT DOES NOT WANT TO DRINK ORAL CONTRAST STATING IT CAUSED HER AN OBSTRUCTION IN THE PAST.

## 2017-03-22 NOTE — Assessment & Plan Note (Addendum)
Reflux much better control. She is actually back down to Nexium 40 mg once daily. No longer using night time antacids or Pepto.

## 2017-03-22 NOTE — Assessment & Plan Note (Signed)
Currently back on MiraLAX but taking 17 g daily with adequate control of her symptoms.

## 2017-03-22 NOTE — Patient Instructions (Signed)
1. Return to the office in 07/2017.

## 2017-03-23 NOTE — Progress Notes (Signed)
cc'ed to pcp °
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
## 2017-04-05 ENCOUNTER — Ambulatory Visit (INDEPENDENT_AMBULATORY_CARE_PROVIDER_SITE_OTHER): Payer: Medicare Other | Admitting: Cardiology

## 2017-04-05 ENCOUNTER — Encounter: Payer: Self-pay | Admitting: Cardiology

## 2017-04-05 VITALS — BP 128/71 | HR 60 | Ht 64.0 in | Wt 124.0 lb

## 2017-04-05 DIAGNOSIS — I10 Essential (primary) hypertension: Secondary | ICD-10-CM | POA: Diagnosis not present

## 2017-04-05 DIAGNOSIS — Z789 Other specified health status: Secondary | ICD-10-CM

## 2017-04-05 DIAGNOSIS — E782 Mixed hyperlipidemia: Secondary | ICD-10-CM

## 2017-04-05 DIAGNOSIS — I2510 Atherosclerotic heart disease of native coronary artery without angina pectoris: Secondary | ICD-10-CM | POA: Diagnosis not present

## 2017-04-05 NOTE — Patient Instructions (Signed)

## 2017-04-15 NOTE — Progress Notes (Signed)
Please let patient know that Dr. Gala Romney recommends MRI abd, pancreatic protocol in 07/2017 to follow up on the tiny pancreatic head lucency. NO ORAL CONTRAST PER PATIENT IF POSSIBLE.  She is coming back to office in mid-November. Let's see if we can ger MRI done a week before.   Please also let her know she had 32mm nodule on thyroid seen on CTA done at [facility] in 01/2017. Let's send her PCP a copy and have her follow up with him. Make contact with PCP nurse with instructions.

## 2017-04-20 DIAGNOSIS — E7800 Pure hypercholesterolemia, unspecified: Secondary | ICD-10-CM | POA: Diagnosis not present

## 2017-04-20 DIAGNOSIS — D529 Folate deficiency anemia, unspecified: Secondary | ICD-10-CM | POA: Diagnosis not present

## 2017-04-20 DIAGNOSIS — E039 Hypothyroidism, unspecified: Secondary | ICD-10-CM | POA: Diagnosis not present

## 2017-04-20 DIAGNOSIS — D500 Iron deficiency anemia secondary to blood loss (chronic): Secondary | ICD-10-CM | POA: Diagnosis not present

## 2017-04-20 DIAGNOSIS — I10 Essential (primary) hypertension: Secondary | ICD-10-CM | POA: Diagnosis not present

## 2017-04-20 DIAGNOSIS — D519 Vitamin B12 deficiency anemia, unspecified: Secondary | ICD-10-CM | POA: Diagnosis not present

## 2017-04-20 DIAGNOSIS — D649 Anemia, unspecified: Secondary | ICD-10-CM | POA: Diagnosis not present

## 2017-04-20 DIAGNOSIS — E782 Mixed hyperlipidemia: Secondary | ICD-10-CM | POA: Diagnosis not present

## 2017-04-25 DIAGNOSIS — E782 Mixed hyperlipidemia: Secondary | ICD-10-CM | POA: Diagnosis not present

## 2017-04-25 DIAGNOSIS — E039 Hypothyroidism, unspecified: Secondary | ICD-10-CM | POA: Diagnosis not present

## 2017-04-25 DIAGNOSIS — M1711 Unilateral primary osteoarthritis, right knee: Secondary | ICD-10-CM | POA: Diagnosis not present

## 2017-04-25 DIAGNOSIS — I10 Essential (primary) hypertension: Secondary | ICD-10-CM | POA: Diagnosis not present

## 2017-04-26 NOTE — Progress Notes (Signed)
Tried to call pt- NA 

## 2017-05-04 NOTE — Progress Notes (Signed)
Pt is aware. She said she already knew about the thyroid nodule and Dr.Teo is monitoring that for her.  Please nic MRI.

## 2017-05-04 NOTE — Progress Notes (Signed)
cc'd pcp 

## 2017-05-05 NOTE — Progress Notes (Signed)
CC'ED TO PCP 

## 2017-05-08 ENCOUNTER — Ambulatory Visit (INDEPENDENT_AMBULATORY_CARE_PROVIDER_SITE_OTHER): Payer: Medicare Other | Admitting: Otolaryngology

## 2017-05-08 DIAGNOSIS — H9202 Otalgia, left ear: Secondary | ICD-10-CM | POA: Diagnosis not present

## 2017-05-08 DIAGNOSIS — R490 Dysphonia: Secondary | ICD-10-CM

## 2017-05-18 DIAGNOSIS — Z1231 Encounter for screening mammogram for malignant neoplasm of breast: Secondary | ICD-10-CM | POA: Diagnosis not present

## 2017-05-18 DIAGNOSIS — M545 Low back pain: Secondary | ICD-10-CM | POA: Diagnosis not present

## 2017-06-02 ENCOUNTER — Other Ambulatory Visit: Payer: Self-pay | Admitting: Cardiology

## 2017-06-28 DIAGNOSIS — Z23 Encounter for immunization: Secondary | ICD-10-CM | POA: Diagnosis not present

## 2017-07-07 DIAGNOSIS — S0181XA Laceration without foreign body of other part of head, initial encounter: Secondary | ICD-10-CM | POA: Diagnosis not present

## 2017-07-07 DIAGNOSIS — Z6821 Body mass index (BMI) 21.0-21.9, adult: Secondary | ICD-10-CM | POA: Diagnosis not present

## 2017-07-24 ENCOUNTER — Ambulatory Visit (INDEPENDENT_AMBULATORY_CARE_PROVIDER_SITE_OTHER): Payer: Medicare Other | Admitting: Gastroenterology

## 2017-07-24 ENCOUNTER — Encounter: Payer: Self-pay | Admitting: Gastroenterology

## 2017-07-24 ENCOUNTER — Encounter: Payer: Self-pay | Admitting: *Deleted

## 2017-07-24 VITALS — BP 149/83 | HR 69 | Temp 97.2°F | Ht 64.0 in | Wt 126.8 lb

## 2017-07-24 DIAGNOSIS — D509 Iron deficiency anemia, unspecified: Secondary | ICD-10-CM | POA: Diagnosis not present

## 2017-07-24 DIAGNOSIS — R1310 Dysphagia, unspecified: Secondary | ICD-10-CM

## 2017-07-24 DIAGNOSIS — K862 Cyst of pancreas: Secondary | ICD-10-CM

## 2017-07-24 DIAGNOSIS — R1319 Other dysphagia: Secondary | ICD-10-CM

## 2017-07-24 DIAGNOSIS — K869 Disease of pancreas, unspecified: Secondary | ICD-10-CM | POA: Diagnosis not present

## 2017-07-24 DIAGNOSIS — K5909 Other constipation: Secondary | ICD-10-CM | POA: Diagnosis not present

## 2017-07-24 LAB — BASIC METABOLIC PANEL
BUN / CREAT RATIO: 23 (calc) — AB (ref 6–22)
BUN: 21 mg/dL (ref 7–25)
CO2: 30 mmol/L (ref 20–32)
CREATININE: 0.92 mg/dL — AB (ref 0.60–0.88)
Calcium: 9.3 mg/dL (ref 8.6–10.4)
Chloride: 103 mmol/L (ref 98–110)
GLUCOSE: 88 mg/dL (ref 65–99)
Potassium: 4.8 mmol/L (ref 3.5–5.3)
Sodium: 138 mmol/L (ref 135–146)

## 2017-07-24 NOTE — Patient Instructions (Addendum)
1. MRI as planned. You will need to have kidney labs done before your MRI per hospital protocol.

## 2017-07-24 NOTE — Progress Notes (Signed)
CC'ED TO PCP 

## 2017-07-24 NOTE — Assessment & Plan Note (Signed)
Doing well on MiraLAX 17 daily.  Continue current regimen.

## 2017-07-24 NOTE — Assessment & Plan Note (Signed)
Resolved

## 2017-07-24 NOTE — Progress Notes (Signed)
Primary Care Physician: Caryl Bis, MD  Primary Gastroenterologist:  Garfield Cornea, MD   Chief Complaint  Patient presents with  . Constipation    f/u, doing better since stopped Iron    HPI: Andrea Jackson is a 83 y.o. female here for f/u. Last seen in 03/2017. She is due for MRI pancreas to follow up on 5x23mm lucency in head of pancreas seen on CT scan back in 01/2017.   Patient continues to have her labs monitored every 3 months by Dr. Quillian Quince for iron deficiency anemia.  She states her hemoglobin has been fine.  She is due for recheck next month.  Since she last saw her she stopped iron.  She has noted a marked improvement in her GI symptoms since coming off of iron.  Constipation dramatically improved, continues to utilize MiraLAX as needed but very manageable.  No abdominal pain.  She is also come off of Nexium and has no reflux symptoms.  Denies any dysphasia.  She is feeling well.  Prior GI history Patient has a history of IDA, heme positive stools EGD and colonoscopy in 2016, baggy esophagus and nodular inflamed antrum with biopsies consistent with reactive gastropathy. Colonoscopy with internal hemorrhoids, scattered pancolonic diverticula. She has a history of small bowel obstruction, chronic pain, chronic constipation, IDA. Small bowel ulcers on capsule study in 2007, with Prometheus panel suggesting Crohn's. She was not treated for Crohn's. CT enterography in March 2014 showed improvement in mild wall thickening that involved a portion of the mid small bowel that was proximal to the prior surgical anastomosis.   Low suspicion for Crohn's disease per [facility] (Dr. Delrae Alfred  Current Outpatient Medications  Medication Sig Dispense Refill  . ALPRAZolam (XANAX) 0.5 MG tablet Take 0.5-1 tablets by mouth at bedtime as needed for sleep or anxiety (May take one-half tablet during the day if needed). For anxiety    . aspirin EC 81 MG tablet Take 81 mg by mouth daily.    . bisacodyl  (DULCOLAX) 5 MG EC tablet Take 5 mg by mouth daily as needed for moderate constipation.    . diclofenac (VOLTAREN) 75 MG EC tablet Take 1 tablet by mouth 2 (two) times daily.     . hydrochlorothiazide (MICROZIDE) 12.5 MG capsule Take 12.5 mg by mouth daily.  1  . isosorbide mononitrate (IMDUR) 30 MG 24 hr tablet TAKE 1/2 TABLET BY MOUTH DAILY. 45 tablet 3  . levothyroxine (SYNTHROID, LEVOTHROID) 137 MCG tablet Take 137 mcg by mouth daily.    Marland Kitchen losartan (COZAAR) 100 MG tablet Take 100 mg by mouth daily. for high blood pressure  3  . nitroGLYCERIN (NITROSTAT) 0.4 MG SL tablet Place 0.4 mg under the tongue as needed for chest pain.     . Omega-3 Fatty Acids (FISH OIL) 1000 MG CAPS Take 1 capsule by mouth daily. Doesn't take everyday    . polyethylene glycol (MIRALAX / GLYCOLAX) packet Take 17 g by mouth daily as needed.     . promethazine (PHENERGAN) 25 MG tablet Take 25 mg by mouth every 6 (six) hours as needed for nausea or vomiting.    . RESTASIS 0.05 % ophthalmic emulsion Place 1 drop into both eyes every evening.     . traMADol (ULTRAM) 50 MG tablet Take 50 mg by mouth every 6 (six) hours as needed. For pain     No current facility-administered medications for this visit.     Allergies as of 07/24/2017 - Review  Complete 07/24/2017  Allergen Reaction Noted  . Demerol [meperidine]  01/31/2017  . Celecoxib    . Codeine    . Oxycodone-acetaminophen Itching 12/15/2010  . Statins Other (See Comments) 02/20/2013    ROS:  General: Negative for anorexia, weight loss, fever, chills, fatigue, weakness. ENT: Negative for hoarseness, difficulty swallowing , nasal congestion. CV: Negative for chest pain, angina, palpitations, dyspnea on exertion, peripheral edema.  Respiratory: Negative for dyspnea at rest, dyspnea on exertion, cough, sputum, wheezing.  GI: See history of present illness. GU:  Negative for dysuria, hematuria, urinary incontinence, urinary frequency, nocturnal urination.  Endo:  Negative for unusual weight change.    Physical Examination:   BP (!) 149/83   Pulse 69   Temp (!) 97.2 F (36.2 C) (Oral)   Ht 5\' 4"  (1.626 m)   Wt 126 lb 12.8 oz (57.5 kg)   BMI 21.77 kg/m   General: Well-nourished, well-developed in no acute distress.  Eyes: No icterus. Mouth: Oropharyngeal mucosa moist and pink , no lesions erythema or exudate. Lungs: Clear to auscultation bilaterally.  Heart: Regular rate and rhythm, no murmurs rubs or gallops.  Abdomen: Bowel sounds are normal, nontender, nondistended, no hepatosplenomegaly or masses, no abdominal bruits or hernia , no rebound or guarding.   Extremities: No lower extremity edema. No clubbing or deformities. Neuro: Alert and oriented x 4   Skin: Warm and dry, no jaundice.   Psych: Alert and cooperative, normal mood and affect.  Labs:  Lab Results  Component Value Date   CREATININE 0.92 02/01/2017   BUN 17 02/01/2017   NA 141 02/01/2017   K 4.3 02/01/2017   CL 108 02/01/2017   CO2 26 02/01/2017   Lab Results  Component Value Date   ALT 18 01/30/2017   AST 18 01/30/2017   ALKPHOS 42 01/30/2017   BILITOT 0.3 01/30/2017   Lab Results  Component Value Date   WBC 6.3 02/15/2017   HGB 11.8 02/15/2017   HCT 37.9 02/15/2017   MCV 92.2 02/15/2017   PLT 304 02/15/2017    Imaging Studies: No results found.

## 2017-07-24 NOTE — Assessment & Plan Note (Signed)
Obviously discussed with Dr. Gala Romney and plans for MRI abdomen, pancreatic protocol to further evaluate 5 x 2 mm lucency in the head of the pancreas seen on prior CT imaging back in May.  Patient agreeable.  She does tell me that she does not want to drink oral contrast as previously developed an obstruction after it.  Other recommendations to follow.

## 2017-07-24 NOTE — Assessment & Plan Note (Signed)
Being followed by PCP.  Patient no longer on iron due to GI side effects.  Obtain copy of most recent CBC for our records.  She will continue follow-up with PCP.

## 2017-07-25 NOTE — Progress Notes (Signed)
Creatinine minimally elevated at baseline. Radiology to determine renal dosing for mri.

## 2017-07-31 ENCOUNTER — Other Ambulatory Visit: Payer: Self-pay | Admitting: Gastroenterology

## 2017-07-31 ENCOUNTER — Ambulatory Visit (HOSPITAL_COMMUNITY)
Admission: RE | Admit: 2017-07-31 | Discharge: 2017-07-31 | Disposition: A | Discharge status: Home / Self Care | Payer: Medicare Other | Source: Ambulatory Visit | Attending: Gastroenterology | Admitting: Gastroenterology

## 2017-07-31 DIAGNOSIS — K862 Cyst of pancreas: Secondary | ICD-10-CM | POA: Diagnosis present

## 2017-07-31 DIAGNOSIS — D1809 Hemangioma of other sites: Secondary | ICD-10-CM | POA: Diagnosis not present

## 2017-07-31 DIAGNOSIS — R9389 Abnormal findings on diagnostic imaging of other specified body structures: Secondary | ICD-10-CM | POA: Diagnosis not present

## 2017-07-31 DIAGNOSIS — N281 Cyst of kidney, acquired: Secondary | ICD-10-CM | POA: Insufficient documentation

## 2017-07-31 MED ORDER — GADOBENATE DIMEGLUMINE 529 MG/ML IV SOLN
10.0000 mL | Freq: Once | INTRAVENOUS | Status: AC | PRN
  Administered 2017-07-31: 10 mL via INTRAVENOUS

## 2017-08-08 DIAGNOSIS — M419 Scoliosis, unspecified: Secondary | ICD-10-CM | POA: Diagnosis not present

## 2017-08-08 DIAGNOSIS — M19071 Primary osteoarthritis, right ankle and foot: Secondary | ICD-10-CM | POA: Diagnosis not present

## 2017-08-18 DIAGNOSIS — E7800 Pure hypercholesterolemia, unspecified: Secondary | ICD-10-CM | POA: Diagnosis not present

## 2017-08-18 DIAGNOSIS — E782 Mixed hyperlipidemia: Secondary | ICD-10-CM | POA: Diagnosis not present

## 2017-08-18 DIAGNOSIS — I10 Essential (primary) hypertension: Secondary | ICD-10-CM | POA: Diagnosis not present

## 2017-08-18 DIAGNOSIS — E039 Hypothyroidism, unspecified: Secondary | ICD-10-CM | POA: Diagnosis not present

## 2017-08-18 DIAGNOSIS — D649 Anemia, unspecified: Secondary | ICD-10-CM | POA: Diagnosis not present

## 2017-08-21 NOTE — Progress Notes (Signed)
Please let patient know that her MRI abdomen did not show the tiny lesion in pancreas that was seen on CT 01/2017 but there was some motion degradation as well.  I will discuss with Dr. Gala Romney to see if we should repeat CT pancreas (as it showed up on CT before) maybe six months or so. Further recommendations to follow.

## 2017-08-22 DIAGNOSIS — Z23 Encounter for immunization: Secondary | ICD-10-CM | POA: Diagnosis not present

## 2017-08-22 DIAGNOSIS — Z0001 Encounter for general adult medical examination with abnormal findings: Secondary | ICD-10-CM | POA: Diagnosis not present

## 2017-08-22 DIAGNOSIS — I10 Essential (primary) hypertension: Secondary | ICD-10-CM | POA: Diagnosis not present

## 2017-08-22 DIAGNOSIS — E782 Mixed hyperlipidemia: Secondary | ICD-10-CM | POA: Diagnosis not present

## 2017-08-22 DIAGNOSIS — Z1212 Encounter for screening for malignant neoplasm of rectum: Secondary | ICD-10-CM | POA: Diagnosis not present

## 2017-08-22 NOTE — Progress Notes (Signed)
Discussed with dr. Gala Romney. He advises, CT pancreatic protocol for pancreatic head lesion seen on prior CT. Please NIC for 01/2018.

## 2017-08-29 DIAGNOSIS — M75122 Complete rotator cuff tear or rupture of left shoulder, not specified as traumatic: Secondary | ICD-10-CM | POA: Diagnosis not present

## 2017-09-16 NOTE — Progress Notes (Signed)
The report clearly states NO aneurysm. She has some scoliosis in her lumbar spine. She can address that finding with [facility] although not likely anything to be done about this.  Please don't forget this needs to be NIC for CT pancreatic protocol for pancreatic head lesion 01/2018. Thanks.

## 2017-09-23 DIAGNOSIS — M5136 Other intervertebral disc degeneration, lumbar region: Secondary | ICD-10-CM | POA: Diagnosis not present

## 2017-09-26 DIAGNOSIS — M75122 Complete rotator cuff tear or rupture of left shoulder, not specified as traumatic: Secondary | ICD-10-CM | POA: Diagnosis not present

## 2017-10-06 DIAGNOSIS — M5137 Other intervertebral disc degeneration, lumbosacral region: Secondary | ICD-10-CM | POA: Diagnosis not present

## 2017-10-06 DIAGNOSIS — M545 Low back pain: Secondary | ICD-10-CM | POA: Diagnosis not present

## 2017-10-09 DIAGNOSIS — D045 Carcinoma in situ of skin of trunk: Secondary | ICD-10-CM | POA: Diagnosis not present

## 2017-10-09 DIAGNOSIS — D485 Neoplasm of uncertain behavior of skin: Secondary | ICD-10-CM | POA: Diagnosis not present

## 2017-10-09 DIAGNOSIS — C763 Malignant neoplasm of pelvis: Secondary | ICD-10-CM | POA: Diagnosis not present

## 2017-10-24 DIAGNOSIS — M9901 Segmental and somatic dysfunction of cervical region: Secondary | ICD-10-CM | POA: Diagnosis not present

## 2017-10-24 DIAGNOSIS — M9903 Segmental and somatic dysfunction of lumbar region: Secondary | ICD-10-CM | POA: Diagnosis not present

## 2017-10-24 DIAGNOSIS — M531 Cervicobrachial syndrome: Secondary | ICD-10-CM | POA: Diagnosis not present

## 2017-10-24 DIAGNOSIS — M47812 Spondylosis without myelopathy or radiculopathy, cervical region: Secondary | ICD-10-CM | POA: Diagnosis not present

## 2017-10-24 DIAGNOSIS — M9902 Segmental and somatic dysfunction of thoracic region: Secondary | ICD-10-CM | POA: Diagnosis not present

## 2017-10-26 DIAGNOSIS — M531 Cervicobrachial syndrome: Secondary | ICD-10-CM | POA: Diagnosis not present

## 2017-10-26 DIAGNOSIS — M9901 Segmental and somatic dysfunction of cervical region: Secondary | ICD-10-CM | POA: Diagnosis not present

## 2017-10-26 DIAGNOSIS — M9903 Segmental and somatic dysfunction of lumbar region: Secondary | ICD-10-CM | POA: Diagnosis not present

## 2017-10-26 DIAGNOSIS — M9902 Segmental and somatic dysfunction of thoracic region: Secondary | ICD-10-CM | POA: Diagnosis not present

## 2017-10-26 DIAGNOSIS — M47812 Spondylosis without myelopathy or radiculopathy, cervical region: Secondary | ICD-10-CM | POA: Diagnosis not present

## 2017-10-30 DIAGNOSIS — M9902 Segmental and somatic dysfunction of thoracic region: Secondary | ICD-10-CM | POA: Diagnosis not present

## 2017-10-30 DIAGNOSIS — M531 Cervicobrachial syndrome: Secondary | ICD-10-CM | POA: Diagnosis not present

## 2017-10-30 DIAGNOSIS — M9903 Segmental and somatic dysfunction of lumbar region: Secondary | ICD-10-CM | POA: Diagnosis not present

## 2017-10-30 DIAGNOSIS — M47812 Spondylosis without myelopathy or radiculopathy, cervical region: Secondary | ICD-10-CM | POA: Diagnosis not present

## 2017-10-30 DIAGNOSIS — M9901 Segmental and somatic dysfunction of cervical region: Secondary | ICD-10-CM | POA: Diagnosis not present

## 2017-10-31 DIAGNOSIS — M47812 Spondylosis without myelopathy or radiculopathy, cervical region: Secondary | ICD-10-CM | POA: Diagnosis not present

## 2017-10-31 DIAGNOSIS — M9902 Segmental and somatic dysfunction of thoracic region: Secondary | ICD-10-CM | POA: Diagnosis not present

## 2017-10-31 DIAGNOSIS — M9901 Segmental and somatic dysfunction of cervical region: Secondary | ICD-10-CM | POA: Diagnosis not present

## 2017-10-31 DIAGNOSIS — M9903 Segmental and somatic dysfunction of lumbar region: Secondary | ICD-10-CM | POA: Diagnosis not present

## 2017-10-31 DIAGNOSIS — M531 Cervicobrachial syndrome: Secondary | ICD-10-CM | POA: Diagnosis not present

## 2017-11-02 DIAGNOSIS — M531 Cervicobrachial syndrome: Secondary | ICD-10-CM | POA: Diagnosis not present

## 2017-11-02 DIAGNOSIS — M9903 Segmental and somatic dysfunction of lumbar region: Secondary | ICD-10-CM | POA: Diagnosis not present

## 2017-11-02 DIAGNOSIS — M47812 Spondylosis without myelopathy or radiculopathy, cervical region: Secondary | ICD-10-CM | POA: Diagnosis not present

## 2017-11-02 DIAGNOSIS — M9901 Segmental and somatic dysfunction of cervical region: Secondary | ICD-10-CM | POA: Diagnosis not present

## 2017-11-02 DIAGNOSIS — M9902 Segmental and somatic dysfunction of thoracic region: Secondary | ICD-10-CM | POA: Diagnosis not present

## 2017-11-03 DIAGNOSIS — M531 Cervicobrachial syndrome: Secondary | ICD-10-CM | POA: Diagnosis not present

## 2017-11-03 DIAGNOSIS — M9902 Segmental and somatic dysfunction of thoracic region: Secondary | ICD-10-CM | POA: Diagnosis not present

## 2017-11-03 DIAGNOSIS — M9901 Segmental and somatic dysfunction of cervical region: Secondary | ICD-10-CM | POA: Diagnosis not present

## 2017-11-03 DIAGNOSIS — M9903 Segmental and somatic dysfunction of lumbar region: Secondary | ICD-10-CM | POA: Diagnosis not present

## 2017-11-03 DIAGNOSIS — M47812 Spondylosis without myelopathy or radiculopathy, cervical region: Secondary | ICD-10-CM | POA: Diagnosis not present

## 2017-11-06 DIAGNOSIS — M531 Cervicobrachial syndrome: Secondary | ICD-10-CM | POA: Diagnosis not present

## 2017-11-06 DIAGNOSIS — M47812 Spondylosis without myelopathy or radiculopathy, cervical region: Secondary | ICD-10-CM | POA: Diagnosis not present

## 2017-11-06 DIAGNOSIS — M9903 Segmental and somatic dysfunction of lumbar region: Secondary | ICD-10-CM | POA: Diagnosis not present

## 2017-11-06 DIAGNOSIS — M9901 Segmental and somatic dysfunction of cervical region: Secondary | ICD-10-CM | POA: Diagnosis not present

## 2017-11-06 DIAGNOSIS — M9902 Segmental and somatic dysfunction of thoracic region: Secondary | ICD-10-CM | POA: Diagnosis not present

## 2017-11-08 DIAGNOSIS — M9901 Segmental and somatic dysfunction of cervical region: Secondary | ICD-10-CM | POA: Diagnosis not present

## 2017-11-08 DIAGNOSIS — M531 Cervicobrachial syndrome: Secondary | ICD-10-CM | POA: Diagnosis not present

## 2017-11-08 DIAGNOSIS — M47812 Spondylosis without myelopathy or radiculopathy, cervical region: Secondary | ICD-10-CM | POA: Diagnosis not present

## 2017-11-08 DIAGNOSIS — M9902 Segmental and somatic dysfunction of thoracic region: Secondary | ICD-10-CM | POA: Diagnosis not present

## 2017-11-08 DIAGNOSIS — M9903 Segmental and somatic dysfunction of lumbar region: Secondary | ICD-10-CM | POA: Diagnosis not present

## 2017-11-10 DIAGNOSIS — M9903 Segmental and somatic dysfunction of lumbar region: Secondary | ICD-10-CM | POA: Diagnosis not present

## 2017-11-10 DIAGNOSIS — M47812 Spondylosis without myelopathy or radiculopathy, cervical region: Secondary | ICD-10-CM | POA: Diagnosis not present

## 2017-11-10 DIAGNOSIS — M9901 Segmental and somatic dysfunction of cervical region: Secondary | ICD-10-CM | POA: Diagnosis not present

## 2017-11-10 DIAGNOSIS — M531 Cervicobrachial syndrome: Secondary | ICD-10-CM | POA: Diagnosis not present

## 2017-11-10 DIAGNOSIS — M9902 Segmental and somatic dysfunction of thoracic region: Secondary | ICD-10-CM | POA: Diagnosis not present

## 2017-11-14 DIAGNOSIS — M531 Cervicobrachial syndrome: Secondary | ICD-10-CM | POA: Diagnosis not present

## 2017-11-14 DIAGNOSIS — M9903 Segmental and somatic dysfunction of lumbar region: Secondary | ICD-10-CM | POA: Diagnosis not present

## 2017-11-14 DIAGNOSIS — M47812 Spondylosis without myelopathy or radiculopathy, cervical region: Secondary | ICD-10-CM | POA: Diagnosis not present

## 2017-11-14 DIAGNOSIS — M9902 Segmental and somatic dysfunction of thoracic region: Secondary | ICD-10-CM | POA: Diagnosis not present

## 2017-11-14 DIAGNOSIS — M9901 Segmental and somatic dysfunction of cervical region: Secondary | ICD-10-CM | POA: Diagnosis not present

## 2017-11-16 DIAGNOSIS — M9901 Segmental and somatic dysfunction of cervical region: Secondary | ICD-10-CM | POA: Diagnosis not present

## 2017-11-16 DIAGNOSIS — M9902 Segmental and somatic dysfunction of thoracic region: Secondary | ICD-10-CM | POA: Diagnosis not present

## 2017-11-16 DIAGNOSIS — M9903 Segmental and somatic dysfunction of lumbar region: Secondary | ICD-10-CM | POA: Diagnosis not present

## 2017-11-16 DIAGNOSIS — M47812 Spondylosis without myelopathy or radiculopathy, cervical region: Secondary | ICD-10-CM | POA: Diagnosis not present

## 2017-11-16 DIAGNOSIS — M531 Cervicobrachial syndrome: Secondary | ICD-10-CM | POA: Diagnosis not present

## 2017-11-21 DIAGNOSIS — M9901 Segmental and somatic dysfunction of cervical region: Secondary | ICD-10-CM | POA: Diagnosis not present

## 2017-11-21 DIAGNOSIS — M531 Cervicobrachial syndrome: Secondary | ICD-10-CM | POA: Diagnosis not present

## 2017-11-21 DIAGNOSIS — M9902 Segmental and somatic dysfunction of thoracic region: Secondary | ICD-10-CM | POA: Diagnosis not present

## 2017-11-21 DIAGNOSIS — M9903 Segmental and somatic dysfunction of lumbar region: Secondary | ICD-10-CM | POA: Diagnosis not present

## 2017-11-21 DIAGNOSIS — M47812 Spondylosis without myelopathy or radiculopathy, cervical region: Secondary | ICD-10-CM | POA: Diagnosis not present

## 2017-11-23 DIAGNOSIS — M9902 Segmental and somatic dysfunction of thoracic region: Secondary | ICD-10-CM | POA: Diagnosis not present

## 2017-11-23 DIAGNOSIS — M47812 Spondylosis without myelopathy or radiculopathy, cervical region: Secondary | ICD-10-CM | POA: Diagnosis not present

## 2017-11-23 DIAGNOSIS — M9901 Segmental and somatic dysfunction of cervical region: Secondary | ICD-10-CM | POA: Diagnosis not present

## 2017-11-23 DIAGNOSIS — M9903 Segmental and somatic dysfunction of lumbar region: Secondary | ICD-10-CM | POA: Diagnosis not present

## 2017-11-23 DIAGNOSIS — M531 Cervicobrachial syndrome: Secondary | ICD-10-CM | POA: Diagnosis not present

## 2017-11-27 DIAGNOSIS — M9901 Segmental and somatic dysfunction of cervical region: Secondary | ICD-10-CM | POA: Diagnosis not present

## 2017-11-27 DIAGNOSIS — M47812 Spondylosis without myelopathy or radiculopathy, cervical region: Secondary | ICD-10-CM | POA: Diagnosis not present

## 2017-11-27 DIAGNOSIS — M9902 Segmental and somatic dysfunction of thoracic region: Secondary | ICD-10-CM | POA: Diagnosis not present

## 2017-11-27 DIAGNOSIS — M531 Cervicobrachial syndrome: Secondary | ICD-10-CM | POA: Diagnosis not present

## 2017-11-27 DIAGNOSIS — M9903 Segmental and somatic dysfunction of lumbar region: Secondary | ICD-10-CM | POA: Diagnosis not present

## 2017-12-04 DIAGNOSIS — M9902 Segmental and somatic dysfunction of thoracic region: Secondary | ICD-10-CM | POA: Diagnosis not present

## 2017-12-04 DIAGNOSIS — M9901 Segmental and somatic dysfunction of cervical region: Secondary | ICD-10-CM | POA: Diagnosis not present

## 2017-12-04 DIAGNOSIS — M47812 Spondylosis without myelopathy or radiculopathy, cervical region: Secondary | ICD-10-CM | POA: Diagnosis not present

## 2017-12-04 DIAGNOSIS — M531 Cervicobrachial syndrome: Secondary | ICD-10-CM | POA: Diagnosis not present

## 2017-12-04 DIAGNOSIS — M9903 Segmental and somatic dysfunction of lumbar region: Secondary | ICD-10-CM | POA: Diagnosis not present

## 2017-12-05 ENCOUNTER — Encounter: Payer: Self-pay | Admitting: *Deleted

## 2017-12-05 ENCOUNTER — Telehealth: Payer: Self-pay | Admitting: Internal Medicine

## 2017-12-05 NOTE — Telephone Encounter (Signed)
Letter mailed

## 2017-12-05 NOTE — Telephone Encounter (Signed)
CT PANCREATIC PROTOCOL

## 2017-12-07 DIAGNOSIS — M531 Cervicobrachial syndrome: Secondary | ICD-10-CM | POA: Diagnosis not present

## 2017-12-07 DIAGNOSIS — M9902 Segmental and somatic dysfunction of thoracic region: Secondary | ICD-10-CM | POA: Diagnosis not present

## 2017-12-07 DIAGNOSIS — M47812 Spondylosis without myelopathy or radiculopathy, cervical region: Secondary | ICD-10-CM | POA: Diagnosis not present

## 2017-12-07 DIAGNOSIS — M9903 Segmental and somatic dysfunction of lumbar region: Secondary | ICD-10-CM | POA: Diagnosis not present

## 2017-12-07 DIAGNOSIS — M9901 Segmental and somatic dysfunction of cervical region: Secondary | ICD-10-CM | POA: Diagnosis not present

## 2017-12-11 DIAGNOSIS — M9902 Segmental and somatic dysfunction of thoracic region: Secondary | ICD-10-CM | POA: Diagnosis not present

## 2017-12-11 DIAGNOSIS — M9901 Segmental and somatic dysfunction of cervical region: Secondary | ICD-10-CM | POA: Diagnosis not present

## 2017-12-11 DIAGNOSIS — M531 Cervicobrachial syndrome: Secondary | ICD-10-CM | POA: Diagnosis not present

## 2017-12-11 DIAGNOSIS — M47812 Spondylosis without myelopathy or radiculopathy, cervical region: Secondary | ICD-10-CM | POA: Diagnosis not present

## 2017-12-11 DIAGNOSIS — M9903 Segmental and somatic dysfunction of lumbar region: Secondary | ICD-10-CM | POA: Diagnosis not present

## 2017-12-17 ENCOUNTER — Observation Stay (HOSPITAL_COMMUNITY)
Admission: EM | Admit: 2017-12-17 | Discharge: 2017-12-19 | Disposition: A | Discharge status: Home / Self Care | Payer: Medicare Other | Attending: Internal Medicine | Admitting: Internal Medicine

## 2017-12-17 ENCOUNTER — Other Ambulatory Visit: Payer: Self-pay

## 2017-12-17 ENCOUNTER — Emergency Department (HOSPITAL_COMMUNITY): Payer: Medicare Other

## 2017-12-17 ENCOUNTER — Encounter (HOSPITAL_COMMUNITY): Payer: Self-pay | Admitting: Emergency Medicine

## 2017-12-17 DIAGNOSIS — D509 Iron deficiency anemia, unspecified: Secondary | ICD-10-CM | POA: Diagnosis present

## 2017-12-17 DIAGNOSIS — R2681 Unsteadiness on feet: Secondary | ICD-10-CM | POA: Insufficient documentation

## 2017-12-17 DIAGNOSIS — R42 Dizziness and giddiness: Secondary | ICD-10-CM

## 2017-12-17 DIAGNOSIS — D72819 Decreased white blood cell count, unspecified: Secondary | ICD-10-CM | POA: Diagnosis present

## 2017-12-17 DIAGNOSIS — I2510 Atherosclerotic heart disease of native coronary artery without angina pectoris: Secondary | ICD-10-CM | POA: Diagnosis present

## 2017-12-17 DIAGNOSIS — Z79899 Other long term (current) drug therapy: Secondary | ICD-10-CM | POA: Diagnosis not present

## 2017-12-17 DIAGNOSIS — I10 Essential (primary) hypertension: Secondary | ICD-10-CM | POA: Diagnosis present

## 2017-12-17 DIAGNOSIS — E039 Hypothyroidism, unspecified: Secondary | ICD-10-CM | POA: Diagnosis not present

## 2017-12-17 DIAGNOSIS — R55 Syncope and collapse: Secondary | ICD-10-CM | POA: Diagnosis present

## 2017-12-17 DIAGNOSIS — R0602 Shortness of breath: Secondary | ICD-10-CM | POA: Diagnosis not present

## 2017-12-17 DIAGNOSIS — K922 Gastrointestinal hemorrhage, unspecified: Secondary | ICD-10-CM | POA: Diagnosis not present

## 2017-12-17 DIAGNOSIS — D649 Anemia, unspecified: Principal | ICD-10-CM | POA: Insufficient documentation

## 2017-12-17 DIAGNOSIS — Z85828 Personal history of other malignant neoplasm of skin: Secondary | ICD-10-CM | POA: Diagnosis not present

## 2017-12-17 DIAGNOSIS — E782 Mixed hyperlipidemia: Secondary | ICD-10-CM | POA: Diagnosis not present

## 2017-12-17 DIAGNOSIS — R5383 Other fatigue: Secondary | ICD-10-CM | POA: Diagnosis not present

## 2017-12-17 DIAGNOSIS — Z7982 Long term (current) use of aspirin: Secondary | ICD-10-CM | POA: Diagnosis not present

## 2017-12-17 DIAGNOSIS — K219 Gastro-esophageal reflux disease without esophagitis: Secondary | ICD-10-CM | POA: Diagnosis not present

## 2017-12-17 HISTORY — DX: Partial intestinal obstruction, unspecified as to cause: K56.600

## 2017-12-17 LAB — RETICULOCYTES
RBC.: 3.93 MIL/uL (ref 3.87–5.11)
RETIC COUNT ABSOLUTE: 39.3 10*3/uL (ref 19.0–186.0)
RETIC CT PCT: 1 % (ref 0.4–3.1)

## 2017-12-17 LAB — URINALYSIS, ROUTINE W REFLEX MICROSCOPIC
Bilirubin Urine: NEGATIVE
GLUCOSE, UA: NEGATIVE mg/dL
HGB URINE DIPSTICK: NEGATIVE
Ketones, ur: NEGATIVE mg/dL
LEUKOCYTES UA: NEGATIVE
Nitrite: NEGATIVE
PH: 6 (ref 5.0–8.0)
Protein, ur: NEGATIVE mg/dL
SPECIFIC GRAVITY, URINE: 1.012 (ref 1.005–1.030)

## 2017-12-17 LAB — COMPREHENSIVE METABOLIC PANEL
ALT: 21 U/L (ref 14–54)
ANION GAP: 8 (ref 5–15)
AST: 19 U/L (ref 15–41)
Albumin: 3.2 g/dL — ABNORMAL LOW (ref 3.5–5.0)
Alkaline Phosphatase: 46 U/L (ref 38–126)
BUN: 27 mg/dL — ABNORMAL HIGH (ref 6–20)
CALCIUM: 8.8 mg/dL — AB (ref 8.9–10.3)
CHLORIDE: 104 mmol/L (ref 101–111)
CO2: 26 mmol/L (ref 22–32)
CREATININE: 1.1 mg/dL — AB (ref 0.44–1.00)
GFR, EST AFRICAN AMERICAN: 53 mL/min — AB (ref >60)
GFR, EST NON AFRICAN AMERICAN: 46 mL/min — AB (ref >60)
Glucose, Bld: 97 mg/dL (ref 65–99)
Potassium: 4 mmol/L (ref 3.5–5.1)
Sodium: 138 mmol/L (ref 135–145)
Total Bilirubin: 0.5 mg/dL (ref 0.3–1.2)
Total Protein: 5.5 g/dL — ABNORMAL LOW (ref 6.5–8.1)

## 2017-12-17 LAB — POC OCCULT BLOOD, ED: FECAL OCCULT BLD: NEGATIVE

## 2017-12-17 LAB — TROPONIN I
Troponin I: <0.03 ng/mL (ref <0.03)
Troponin I: <0.03 ng/mL (ref <0.03)

## 2017-12-17 LAB — CBC WITH DIFFERENTIAL/PLATELET
BASOS ABS: 0 10*3/uL (ref 0.0–0.1)
Basophils Relative: 1 %
Eosinophils Absolute: 0.2 10*3/uL (ref 0.0–0.7)
Eosinophils Relative: 5 %
HCT: 27.5 % — ABNORMAL LOW (ref 36.0–46.0)
Hemoglobin: 8.2 g/dL — ABNORMAL LOW (ref 12.0–15.0)
LYMPHS ABS: 0.9 10*3/uL (ref 0.7–4.0)
LYMPHS PCT: 23 %
MCH: 21 pg — ABNORMAL LOW (ref 26.0–34.0)
MCHC: 29.8 g/dL — AB (ref 30.0–36.0)
MCV: 70.3 fL — ABNORMAL LOW (ref 78.0–100.0)
Monocytes Absolute: 0.4 10*3/uL (ref 0.1–1.0)
Monocytes Relative: 11 %
NEUTROS ABS: 2.3 10*3/uL (ref 1.7–7.7)
Neutrophils Relative %: 60 %
Platelets: 313 10*3/uL (ref 150–400)
RBC: 3.91 MIL/uL (ref 3.87–5.11)
RDW: 17.3 % — ABNORMAL HIGH (ref 11.5–15.5)
WBC: 3.8 10*3/uL — ABNORMAL LOW (ref 4.0–10.5)

## 2017-12-17 LAB — TSH: TSH: 1.727 u[IU]/mL (ref 0.350–4.500)

## 2017-12-17 LAB — BRAIN NATRIURETIC PEPTIDE: B NATRIURETIC PEPTIDE 5: 147 pg/mL — AB (ref 0.0–100.0)

## 2017-12-17 MED ORDER — POLYETHYLENE GLYCOL 3350 17 G PO PACK: 17.0000 g | PACK | Freq: Every day | ORAL | Status: DC | PRN

## 2017-12-17 MED ORDER — SODIUM CHLORIDE 0.9 % IV SOLN
Freq: Once | INTRAVENOUS | Status: AC
  Administered 2017-12-18: 08:00:00 via INTRAVENOUS

## 2017-12-17 MED ORDER — ASPIRIN EC 81 MG PO TBEC: 81.0000 mg | DELAYED_RELEASE_TABLET | Freq: Every day | ORAL | Status: DC

## 2017-12-17 MED ORDER — LEVOTHYROXINE SODIUM 137 MCG PO TABS
137.0000 ug | ORAL_TABLET | Freq: Every day | ORAL | Status: DC
  Administered 2017-12-18 – 2017-12-19 (×2): 137 ug via ORAL
  Filled 2017-12-17 (×2): qty 1

## 2017-12-17 MED ORDER — ISOSORBIDE MONONITRATE ER 30 MG PO TB24
15.0000 mg | ORAL_TABLET | Freq: Every day | ORAL | Status: DC
  Administered 2017-12-18 – 2017-12-19 (×2): 15 mg via ORAL
  Filled 2017-12-17 (×2): qty 1

## 2017-12-17 MED ORDER — PROMETHAZINE HCL 12.5 MG PO TABS
25.0000 mg | ORAL_TABLET | Freq: Four times a day (QID) | ORAL | Status: DC | PRN
  Administered 2017-12-17: 25 mg via ORAL
  Filled 2017-12-17: qty 2

## 2017-12-17 MED ORDER — TRAMADOL HCL 50 MG PO TABS
50.0000 mg | ORAL_TABLET | Freq: Four times a day (QID) | ORAL | Status: DC | PRN
  Administered 2017-12-17 – 2017-12-18 (×3): 50 mg via ORAL
  Filled 2017-12-17 (×3): qty 1

## 2017-12-17 MED ORDER — METOPROLOL SUCCINATE ER 25 MG PO TB24
25.0000 mg | ORAL_TABLET | Freq: Every day | ORAL | Status: DC
  Administered 2017-12-18 – 2017-12-19 (×2): 25 mg via ORAL
  Filled 2017-12-17 (×2): qty 1

## 2017-12-17 MED ORDER — HYDROCHLOROTHIAZIDE 12.5 MG PO CAPS
12.5000 mg | ORAL_CAPSULE | Freq: Every day | ORAL | Status: DC
  Administered 2017-12-18 – 2017-12-19 (×2): 12.5 mg via ORAL
  Filled 2017-12-17 (×2): qty 1

## 2017-12-17 MED ORDER — LOSARTAN POTASSIUM 50 MG PO TABS
100.0000 mg | ORAL_TABLET | Freq: Every day | ORAL | Status: DC
  Administered 2017-12-18 – 2017-12-19 (×2): 100 mg via ORAL
  Filled 2017-12-17 (×2): qty 2

## 2017-12-17 MED ORDER — ALPRAZOLAM 0.25 MG PO TABS
0.2500 mg | ORAL_TABLET | Freq: Every evening | ORAL | Status: DC | PRN
  Administered 2017-12-17 – 2017-12-18 (×2): 0.25 mg via ORAL
  Filled 2017-12-17 (×2): qty 1

## 2017-12-17 NOTE — ED Triage Notes (Signed)
Pt states that she has been sob for the past couple days she is hurting allover. She states that she almost passed out yesterday.
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
## 2017-12-18 ENCOUNTER — Observation Stay (HOSPITAL_BASED_OUTPATIENT_CLINIC_OR_DEPARTMENT_OTHER): Payer: Medicare Other

## 2017-12-18 ENCOUNTER — Encounter (HOSPITAL_COMMUNITY): Payer: Self-pay | Admitting: Internal Medicine

## 2017-12-18 DIAGNOSIS — I10 Essential (primary) hypertension: Secondary | ICD-10-CM | POA: Diagnosis not present

## 2017-12-18 DIAGNOSIS — K922 Gastrointestinal hemorrhage, unspecified: Secondary | ICD-10-CM

## 2017-12-18 DIAGNOSIS — D649 Anemia, unspecified: Principal | ICD-10-CM

## 2017-12-18 DIAGNOSIS — E039 Hypothyroidism, unspecified: Secondary | ICD-10-CM | POA: Diagnosis not present

## 2017-12-18 DIAGNOSIS — R55 Syncope and collapse: Secondary | ICD-10-CM

## 2017-12-18 DIAGNOSIS — K219 Gastro-esophageal reflux disease without esophagitis: Secondary | ICD-10-CM

## 2017-12-18 DIAGNOSIS — I2510 Atherosclerotic heart disease of native coronary artery without angina pectoris: Secondary | ICD-10-CM | POA: Diagnosis not present

## 2017-12-18 DIAGNOSIS — E782 Mixed hyperlipidemia: Secondary | ICD-10-CM | POA: Diagnosis not present

## 2017-12-18 DIAGNOSIS — R42 Dizziness and giddiness: Secondary | ICD-10-CM | POA: Diagnosis not present

## 2017-12-18 DIAGNOSIS — I351 Nonrheumatic aortic (valve) insufficiency: Secondary | ICD-10-CM

## 2017-12-18 DIAGNOSIS — R0602 Shortness of breath: Secondary | ICD-10-CM | POA: Diagnosis not present

## 2017-12-18 DIAGNOSIS — D72819 Decreased white blood cell count, unspecified: Secondary | ICD-10-CM

## 2017-12-18 DIAGNOSIS — D500 Iron deficiency anemia secondary to blood loss (chronic): Secondary | ICD-10-CM

## 2017-12-18 LAB — ECHOCARDIOGRAM COMPLETE
HEIGHTINCHES: 64 in
WEIGHTICAEL: 2088.2 [oz_av]

## 2017-12-18 LAB — CBC
HCT: 27.2 % — ABNORMAL LOW (ref 36.0–46.0)
Hemoglobin: 7.8 g/dL — ABNORMAL LOW (ref 12.0–15.0)
MCH: 20.3 pg — ABNORMAL LOW (ref 26.0–34.0)
MCHC: 28.7 g/dL — ABNORMAL LOW (ref 30.0–36.0)
MCV: 70.8 fL — ABNORMAL LOW (ref 78.0–100.0)
PLATELETS: 292 10*3/uL (ref 150–400)
RBC: 3.84 MIL/uL — AB (ref 3.87–5.11)
RDW: 17.3 % — AB (ref 11.5–15.5)
WBC: 2.7 10*3/uL — AB (ref 4.0–10.5)

## 2017-12-18 LAB — IRON AND TIBC
IRON: 7 ug/dL — AB (ref 28–170)
Saturation Ratios: 2 % — ABNORMAL LOW (ref 10.4–31.8)
TIBC: 445 ug/dL (ref 250–450)
UIBC: 438 ug/dL

## 2017-12-18 LAB — PREPARE RBC (CROSSMATCH)

## 2017-12-18 LAB — FERRITIN: FERRITIN: 3 ng/mL — AB (ref 11–307)

## 2017-12-18 LAB — TROPONIN I

## 2017-12-18 LAB — VITAMIN B12: Vitamin B-12: 154 pg/mL — ABNORMAL LOW (ref 180–914)

## 2017-12-18 LAB — FOLATE: Folate: 11.7 ng/mL (ref >5.9)

## 2017-12-18 MED ORDER — DICLOFENAC SODIUM 1 % TD GEL
2.0000 g | Freq: Four times a day (QID) | TRANSDERMAL | Status: DC | PRN
  Administered 2017-12-18 – 2017-12-19 (×2): 2 g via TOPICAL
  Filled 2017-12-18 (×2): qty 100

## 2017-12-18 NOTE — Progress Notes (Signed)
*  PRELIMINARY RESULTS* Echocardiogram 2D Echocardiogram has been performed.  Andrea Jackson 12/18/2017, 9:29 AM

## 2017-12-18 NOTE — Progress Notes (Addendum)
Progress Note    Andrea Jackson  [id number]:[id number] DOB: 10-Dec-1933  DOA: 12/17/2017 PCP: Caryl Bis, MD    Brief Narrative:   Chief complaint: follow-up near syncope.  Medical records reviewed and are as summarized below:  Andrea Jackson is an 84 y.o. female with a PMH of hypothyroidism, CAD, diverticulosis, GI bleeding and iron deficiency anemia who was admitted 12/17/17 for evaluation of shortness of breath associated with dizziness and near syncope in the setting of chronic blood loss anemia from GI bleeding. Upon initial evaluation in the ED, her hemoglobin was found to be 8.2.  Assessment/Plan:   Principal Problem:   Near syncope Thought to be from GI bleeding and a drop in her hemoglobin 28.2 mg/dL from her baseline of about 12. Received 1 unit of PRBCs on admission. She was not appreciably orthostatic, and denies frankly black/bloody stools. Follow-up 2-D echocardiogram. Continue to monitor on telemetry.  Active Problems:   Hypothyroidism Continue Synthroid. TSH WNL.    Mixed hyperlipidemia Fish oil currently on hold in the setting of potential recurrent GI bleeding.    Iron deficiency anemia secondary to chronic GI bleeding of uncertain etiology Appears to be from chronic blood loss from a GI source. She had an EGD done 01/2017 which did show gastritis. She had internal hemorrhoids and diverticula by colonoscopy 07/2015. She reports that she has also been worked up with capsule endoscopy.She will need close outpatient GI follow-up. Stop aspirin and fish oil.    Essential hypertension, benign Currently being managed with metoprolol, Cozaar, Imdurwith reasonable blood pressure control.    CORONARY ATHEROSCLEROSIS NATIVE CORONARY ARTERY Hold aspirin given drop in hemoglobin. Troponin negative 3. CXR personally reviewed and is without acute cardiovascular findings. 2-D echo shows EF 55-60 percent with no regional wall motion abnormalities.    GERD (gastroesophageal reflux  disease) Continue PPI therapy.    AKI Baseline creatinine appears to be about 0.9 and current creatinine elevated over usual baseline values. Gently hydrate.    Leukopenia WBC normal 02/15/2017. Unclear etiology.  Body mass index is 22.4 kg/m.   Family Communication/Anticipated D/C date and plan/Code Status   DVT prophylaxis: Lovenox ordered. Code Status: DO NOT RESUSCITATE.  Family Communication: No family currently at the bedside. Disposition Plan: Likely stable for discharge 12/19/17 if hemoglobin stable.   Medical Consultants:    None.   Anti-Infectives:    None  Subjective:   The patient continues to report feeling dizzy when up and about and mildly short of breath. Denies black/bloody stools.  Objective:    Vitals:   12/17/17 2014 12/17/17 2015 12/17/17 2017 12/18/17 0537  BP: 131/68 (!) 148/68 (!) 144/69 116/64  Pulse: 64 62 65 60  Resp: 16   18  Temp: 98 F (36.7 C)   98.1 F (36.7 C)  TempSrc: Oral     SpO2: 95% 96% 96% 97%  Weight:      Height:       No intake or output data in the 24 hours ending 12/18/17 0801 Filed Weights   12/17/17 1526 12/17/17 2013  Weight: 56.2 kg (124 lb) 59.2 kg (130 lb 8.2 oz)    Exam: General: Elderly female in no acute distress. Cardiovascular: Heart sounds show a regular rate, and rhythm. No gallops or rubs. No murmurs. No JVD. Lungs: Clear to auscultation bilaterally with good air movement. No rales, rhonchi or wheezes. Abdomen: Soft, mildly tender RLQ, non-distended abdomen with normal active bowel sounds. No masses. No hepatosplenomegaly.  Neurological: Alert and oriented 3. Moves all extremities 4 with equal strength. Cranial nerves II through XII grossly intact. Skin: Warm and dry. No rashes or lesions. Extremities: No clubbing or cyanosis. No edema. Pedal pulses 2+. Psychiatric: Mood and affect are normal. Insight and judgment are normal.   Data Reviewed:   I have personally reviewed following labs and  imaging studies:  Labs: Labs show the following:   Basic Metabolic Panel: Recent Labs  Lab 12/17/17 1615  NA 138  K 4.0  CL 104  CO2 26  GLUCOSE 97  BUN 27*  CREATININE 1.10*  CALCIUM 8.8*   GFR Estimated Creatinine Clearance: 34.6 mL/min (A) (by C-G formula based on SCr of 1.1 mg/dL (H)). Liver Function Tests: Recent Labs  Lab 12/17/17 1615  AST 19  ALT 21  ALKPHOS 46  BILITOT 0.5  PROT 5.5*  ALBUMIN 3.2*   CBC: Recent Labs  Lab 12/17/17 1615 12/18/17 0531  WBC 3.8* 2.7*  NEUTROABS 2.3  --   HGB 8.2* 7.8*  HCT 27.5* 27.2*  MCV 70.3* 70.8*  PLT 313 292   Cardiac Enzymes: Recent Labs  Lab 12/17/17 1615 12/17/17 2145 12/18/17 0531  TROPONINI <0.03 <0.03 <0.03   Thyroid function studies: Recent Labs    12/17/17 1615  TSH 1.727   Anemia work up: Recent Labs    12/17/17 1615  RETICCTPCT 1.0   Microbiology No results found for this or any previous visit (from the past 240 hour(s)).  Procedures and diagnostic studies:  Dg Chest 2 View  Result Date: 12/17/2017 CLINICAL DATA:  84 year old with intermittent shortness of breath over the past several days, acutely worsened today. EXAM: CHEST - 2 VIEW COMPARISON:  01/30/2017, 07/07/2015 and earlier, including CTA chest 01/30/2017. FINDINGS: AP ERECT and LATERAL images were obtained. Cardiac silhouette upper normal in size for AP technique, unchanged. Thoracic aorta mildly tortuous and atherosclerotic, unchanged. Hilar and mediastinal contours otherwise unremarkable. Lungs clear. Bronchovascular markings normal. Pulmonary vascularity normal. No visible pleural effusions. No pneumothorax. Visualized bony thorax intact. Thoracolumbar levoscoliosis as noted previously. IMPRESSION: No acute cardiopulmonary disease. Electronically Signed   By: Evangeline Dakin M.D.   On: 12/17/2017 16:07    Medications:   . aspirin EC  81 mg Oral Daily  . hydrochlorothiazide  12.5 mg Oral Daily  . isosorbide mononitrate  15 mg  Oral Daily  . levothyroxine  137 mcg Oral QAC breakfast  . losartan  100 mg Oral Daily  . metoprolol succinate  25 mg Oral Daily   Continuous Infusions:   LOS: 0 days   Jacquelynn Cree  [facility] Pager [phone]. If unable to reach me by pager, please call my cell phone at [phone].  *Please refer to [email or web address], password [email or web address] to get updated schedule on who will round on this patient, as hospitalists switch teams weekly. If 7PM-7AM, please contact night-coverage at [email or web address], password [email or web address] for any overnight needs.  12/18/2017, 8:01 AM

## 2017-12-18 NOTE — Progress Notes (Signed)
Pt stating Tramadol is not helping with her back pain and requesting her Voltaren Gel 1% that she uses at home. Bodenehimer on call paged to make aware. Waiting for orders/call back
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
## 2017-12-19 DIAGNOSIS — K922 Gastrointestinal hemorrhage, unspecified: Secondary | ICD-10-CM | POA: Diagnosis not present

## 2017-12-19 DIAGNOSIS — E782 Mixed hyperlipidemia: Secondary | ICD-10-CM

## 2017-12-19 DIAGNOSIS — R0602 Shortness of breath: Secondary | ICD-10-CM | POA: Diagnosis not present

## 2017-12-19 DIAGNOSIS — R55 Syncope and collapse: Secondary | ICD-10-CM | POA: Diagnosis not present

## 2017-12-19 DIAGNOSIS — R42 Dizziness and giddiness: Secondary | ICD-10-CM

## 2017-12-19 DIAGNOSIS — I10 Essential (primary) hypertension: Secondary | ICD-10-CM | POA: Diagnosis not present

## 2017-12-19 DIAGNOSIS — I2510 Atherosclerotic heart disease of native coronary artery without angina pectoris: Secondary | ICD-10-CM | POA: Diagnosis not present

## 2017-12-19 DIAGNOSIS — D649 Anemia, unspecified: Secondary | ICD-10-CM | POA: Diagnosis not present

## 2017-12-19 LAB — CBC
HEMATOCRIT: 33.4 % — AB (ref 36.0–46.0)
Hemoglobin: 10.1 g/dL — ABNORMAL LOW (ref 12.0–15.0)
MCH: 22.1 pg — ABNORMAL LOW (ref 26.0–34.0)
MCHC: 30.2 g/dL (ref 30.0–36.0)
MCV: 72.9 fL — ABNORMAL LOW (ref 78.0–100.0)
Platelets: 285 10*3/uL (ref 150–400)
RBC: 4.58 MIL/uL (ref 3.87–5.11)
RDW: 18.1 % — ABNORMAL HIGH (ref 11.5–15.5)
WBC: 4.2 10*3/uL (ref 4.0–10.5)

## 2017-12-19 MED ORDER — SODIUM CHLORIDE 0.9 % IV SOLN
25.0000 mg | Freq: Once | INTRAVENOUS | Status: AC
  Administered 2017-12-19: 25 mg via INTRAVENOUS
  Filled 2017-12-19: qty 2

## 2017-12-19 MED ORDER — ALUM & MAG HYDROXIDE-SIMETH 200-200-20 MG/5ML PO SUSP
30.0000 mL | Freq: Four times a day (QID) | ORAL | Status: DC | PRN
  Administered 2017-12-19: 30 mL via ORAL
  Filled 2017-12-19: qty 30

## 2017-12-19 MED ORDER — SODIUM CHLORIDE 0.9 % IV SOLN
125.0000 mg | Freq: Once | INTRAVENOUS | Status: AC
  Administered 2017-12-19: 125 mg via INTRAVENOUS
  Filled 2017-12-19: qty 10

## 2017-12-19 MED ORDER — SODIUM CHLORIDE 0.9 % IV SOLN: 25.0000 mg | Freq: Once | INTRAVENOUS | Status: DC

## 2017-12-19 NOTE — Progress Notes (Signed)
IV removed, 2x2 gauze and paper tape applied to site, patient tolerated well.  Reviewed AVS with patient who verbalized understanding. Patient to be transported home by granddaughter.

## 2017-12-19 NOTE — Discharge Summary (Signed)
Physician Discharge Summary  EMBREE BRAWLEY URK:[id number] DOB: Sep 23, 1933 DOA: 12/17/2017  PCP: Caryl Bis, MD  Admit date: 12/17/2017 Discharge date: 12/19/2017  Admitted From: Home Discharge disposition: Home   Recommendations for Outpatient Follow-Up:   1. Consider IV iron therapy as an outpatient for management of chronic blood loss anemia. Given a dose of Nulecit on 12/18/17.   Discharge Diagnosis:   Principal Problem:   Near syncope Active Problems:   Hypothyroidism   Mixed hyperlipidemia   Iron deficiency anemia   Essential hypertension, benign   CORONARY ATHEROSCLEROSIS NATIVE CORONARY ARTERY   Hemorrhage of gastrointestinal tract   GERD (gastroesophageal reflux disease)   Leukopenia  Discharge Condition: Improved.  Diet recommendation: Low sodium, heart healthy.    Code status: DNR.   History of Present Illness:   Andrea Jackson is an 84 y.o. female with a PMH of hypothyroidism, CAD, diverticulosis, GI bleeding and iron deficiency anemia who was admitted 12/17/17 for evaluation of shortness of breath associated with dizziness and near syncope in the setting of chronic blood loss anemia from GI bleeding. Upon initial evaluation in the ED, her hemoglobin was found to be 8.2.  Hospital Course by Problem:   Principal Problem:   Near syncope Thought to be from GI bleeding and a drop in her hemoglobin 28.2 mg/dL from her baseline of about 12. Received 1 unit of PRBCs 12/18/17 hemoglobin 10.1 this morning. She was not appreciably orthostatic, and denied frankly black/bloody stools.  Discussed case with Dr. Gala Romney as well.  2-D echocardiogram results reviewed.  EF 55-60% with normal wall motion, no mention of pericardial effusion or significant valvular abnormalities. No events on telemetry. Felt better at d/c s/p PRBCs.  Active Problems:   Hypothyroidism Continue Synthroid. TSH WNL.    Mixed hyperlipidemia Fish oil currently on hold in the setting of potential  recurrent GI bleeding. OK to resume at d/c but would continue to hold ASA x 1 week.    Iron deficiency anemia secondary to chronic GI bleeding of uncertain etiology Appears to be from chronic blood loss from a GI source. She had an EGD done 01/2017 which did show gastritis. She had internal hemorrhoids and diverticula by colonoscopy 07/2015. She reports that she has also been worked up with capsule endoscopy.She will need close outpatient GI follow-up.  Continue to hold aspirin. Given IV iron prior to discharge.    Essential hypertension, benign Currently being managed with metoprolol, Cozaar, Imdur with reasonable blood pressure control.    CORONARY ATHEROSCLEROSIS NATIVE CORONARY ARTERY Hold aspirin given drop in hemoglobin. Troponin negative 3. CXR was without acute cardiovascular findings. 2-D echo shows EF 55-60 percent with no regional wall motion abnormalities.    GERD (gastroesophageal reflux disease) Continue PPI therapy.    AKI Baseline creatinine appears to be about 0.9 and current creatinine elevated over usual baseline values. Gently hydrate.    Leukopenia WBC WNL today.  Medical Consultants:    None.   Discharge Exam:   Vitals:   12/19/17 0521 12/19/17 0946  BP: (!) 149/61   Pulse: (!) 57   Resp:    Temp: 98.3 F (36.8 C)   SpO2: 91% 92%   Vitals:   12/18/17 2208 12/19/17 0430 12/19/17 0521 12/19/17 0946  BP: 137/73 (!) 148/98 (!) 149/61   Pulse: (!) 57 67 (!) 57   Resp:      Temp: 97.9 F (36.6 C) (!) 97.5 F (36.4 C) 98.3 F (36.8 C)   TempSrc: Oral  Oral Oral   SpO2: 92% 93% 91% 92%  Weight:      Height:        General exam: Appears calm and comfortable.  Respiratory system: Clear to auscultation. Respiratory effort normal. Cardiovascular system: S1 & S2 heard, RRR. No JVD,  rubs, gallops or clicks. No murmurs. Gastrointestinal system: Abdomen is nondistended, soft and nontender. No organomegaly or masses felt. Normal bowel sounds  heard. Central nervous system: Alert and oriented. No focal neurological deficits. Extremities: No clubbing,  or cyanosis. No edema. Skin: No rashes, lesions or ulcers. Psychiatry: Judgement and insight appear normal. Mood & affect appropriate.    The results of significant diagnostics from this hospitalization (including imaging, microbiology, ancillary and laboratory) are listed below for reference.     Procedures and Diagnostic Studies:   Dg Chest 2 View  Result Date: 12/17/2017 CLINICAL DATA:  84 year old with intermittent shortness of breath over the past several days, acutely worsened today. EXAM: CHEST - 2 VIEW COMPARISON:  01/30/2017, 07/07/2015 and earlier, including CTA chest 01/30/2017. FINDINGS: AP ERECT and LATERAL images were obtained. Cardiac silhouette upper normal in size for AP technique, unchanged. Thoracic aorta mildly tortuous and atherosclerotic, unchanged. Hilar and mediastinal contours otherwise unremarkable. Lungs clear. Bronchovascular markings normal. Pulmonary vascularity normal. No visible pleural effusions. No pneumothorax. Visualized bony thorax intact. Thoracolumbar levoscoliosis as noted previously. IMPRESSION: No acute cardiopulmonary disease. Electronically Signed   By: Evangeline Dakin M.D.   On: 12/17/2017 16:07     Labs:   Basic Metabolic Panel: Recent Labs  Lab 12/17/17 1615  NA 138  K 4.0  CL 104  CO2 26  GLUCOSE 97  BUN 27*  CREATININE 1.10*  CALCIUM 8.8*   GFR Estimated Creatinine Clearance: 34.6 mL/min (A) (by C-G formula based on SCr of 1.1 mg/dL (H)). Liver Function Tests: Recent Labs  Lab 12/17/17 1615  AST 19  ALT 21  ALKPHOS 46  BILITOT 0.5  PROT 5.5*  ALBUMIN 3.2*   CBC: Recent Labs  Lab 12/17/17 1615 12/18/17 0531 12/19/17 0422  WBC 3.8* 2.7* 4.2  NEUTROABS 2.3  --   --   HGB 8.2* 7.8* 10.1*  HCT 27.5* 27.2* 33.4*  MCV 70.3* 70.8* 72.9*  PLT 313 292 285   Cardiac Enzymes: Recent Labs  Lab 12/17/17 1615  12/17/17 2145 12/18/17 0531  TROPONINI <0.03 <0.03 <0.03   Thyroid function studies Recent Labs    12/17/17 1615  TSH 1.727   Anemia work up Recent Labs    12/17/17 1615  VITAMINB12 154*  FOLATE 11.7  FERRITIN 3*  TIBC 445  IRON 7*  RETICCTPCT 1.0   Microbiology No results found for this or any previous visit (from the past 240 hour(s)).   Discharge Instructions:   Discharge Instructions    Call MD for:   Complete by:  As directed    Black or bloody stools.   Call MD for:  extreme fatigue   Complete by:  As directed    Call MD for:  persistant dizziness or light-headedness   Complete by:  As directed    Diet - low sodium heart healthy   Complete by:  As directed    Increase activity slowly   Complete by:  As directed      Allergies as of 12/19/2017      Reactions   Demerol [meperidine]    Patient stated she had a "reaction" to Demerol. Swollen lip.   Celecoxib    REACTION: hyper, couldn't eat or  sleep   Codeine    REACTION: itching   Oxycodone-acetaminophen Itching   Statins Other (See Comments)   Muscle aches, can not tolerate any of them per patient.        Medication List    STOP taking these medications   aspirin EC 81 MG tablet     TAKE these medications   ALPRAZolam 0.5 MG tablet Commonly known as:  XANAX Take 0.5-1 tablets by mouth at bedtime as needed for sleep or anxiety (May take one-half tablet during the day if needed). For anxiety   bisacodyl 5 MG EC tablet Commonly known as:  DULCOLAX Take 5 mg by mouth daily as needed for moderate constipation.   diclofenac 75 MG EC tablet Commonly known as:  VOLTAREN Take 1 tablet by mouth 2 (two) times daily.   diclofenac sodium 1 % Gel Commonly known as:  VOLTAREN diclofenac 1 % topical gel  APPLY 2-4 GRAMS FOUR TIMES DAILY AS NEEDED FOR INFLAMMATION   esomeprazole 40 MG capsule Commonly known as:  NEXIUM Take 1 tablet by mouth as needed.   Fish Oil 1000 MG Caps Take 1 capsule by  mouth daily. Doesn't take everyday   hydrochlorothiazide 12.5 MG capsule Commonly known as:  MICROZIDE Take 12.5 mg by mouth daily.   isosorbide mononitrate 30 MG 24 hr tablet Commonly known as:  IMDUR TAKE 1/2 TABLET BY MOUTH DAILY.   levothyroxine 137 MCG tablet Commonly known as:  SYNTHROID, LEVOTHROID Take 137 mcg by mouth daily.   losartan 100 MG tablet Commonly known as:  COZAAR Take 100 mg by mouth daily. for high blood pressure   LOTEMAX 0.5 % ophthalmic suspension Generic drug:  loteprednol as needed.   metoprolol succinate 25 MG 24 hr tablet Commonly known as:  TOPROL-XL Take 1 tablet by mouth daily.   nitroGLYCERIN 0.4 MG SL tablet Commonly known as:  NITROSTAT Place 0.4 mg under the tongue as needed for chest pain.   polyethylene glycol packet Commonly known as:  MIRALAX / GLYCOLAX Take 17 g by mouth daily as needed for mild constipation.   promethazine 25 MG tablet Commonly known as:  PHENERGAN Take 25 mg by mouth every 6 (six) hours as needed for nausea or vomiting.   RESTASIS 0.05 % ophthalmic emulsion Generic drug:  cycloSPORINE Place 1 drop into both eyes every evening.   traMADol 50 MG tablet Commonly known as:  ULTRAM Take 50 mg by mouth every 6 (six) hours as needed. For pain   vitamin B-12 250 MCG tablet Commonly known as:  CYANOCOBALAMIN Take 250 mcg by mouth as needed.      Follow-up Information    Caryl Bis, MD On 12/26/2017.   Specialty:  Family Medicine Why:  Re-check CBC. at 2:15 pm Contact information: [address] [phone]            Time coordinating discharge: 34 minutes.  SignedMargreta Journey Genette Huertas  Pager [phone] [facility] 12/19/2017, 1:27 PM

## 2017-12-21 LAB — BPAM RBC
Blood Product Expiration Date: 201904282359
Blood Product Expiration Date: 201904282359
ISSUE DATE / TIME: 201904081103
ISSUE DATE / TIME: 201904081103
Unit Type and Rh: 6200
Unit Type and Rh: 6200

## 2017-12-21 LAB — TYPE AND SCREEN
ABO/RH(D): A POS
ANTIBODY SCREEN: POSITIVE
UNIT DIVISION: 0
Unit division: 0

## 2017-12-26 DIAGNOSIS — R55 Syncope and collapse: Secondary | ICD-10-CM | POA: Diagnosis not present

## 2017-12-26 DIAGNOSIS — D529 Folate deficiency anemia, unspecified: Secondary | ICD-10-CM | POA: Diagnosis not present

## 2017-12-26 DIAGNOSIS — Z6821 Body mass index (BMI) 21.0-21.9, adult: Secondary | ICD-10-CM | POA: Diagnosis not present

## 2017-12-26 DIAGNOSIS — D500 Iron deficiency anemia secondary to blood loss (chronic): Secondary | ICD-10-CM | POA: Diagnosis not present

## 2017-12-26 DIAGNOSIS — D519 Vitamin B12 deficiency anemia, unspecified: Secondary | ICD-10-CM | POA: Diagnosis not present

## 2017-12-28 ENCOUNTER — Telehealth: Payer: Self-pay | Admitting: Internal Medicine

## 2017-12-28 DIAGNOSIS — K869 Disease of pancreas, unspecified: Secondary | ICD-10-CM

## 2017-12-28 NOTE — Telephone Encounter (Signed)
CT scheduled for 01/18/18 at 10:00am, npo 4 hrs prior. Patient will drink water once she arrives. No need to pick up oral contrast. Patient is aware of appt details. Letter mailed  [facility] website and no PA is required for pt CT.

## 2017-12-28 NOTE — Telephone Encounter (Signed)
Spoke with pt, she needs to schedule her procedure.

## 2017-12-28 NOTE — Addendum Note (Signed)
Addended by: Inge Rise on: 12/28/2017 12:05 PM   Modules accepted: Orders

## 2017-12-28 NOTE — Telephone Encounter (Signed)
Pt called to say she received a letter from [place] that she needed to schedule her CT and also had questions about labs. Please call her at [phone]

## 2018-01-18 ENCOUNTER — Ambulatory Visit (HOSPITAL_COMMUNITY)
Admission: RE | Admit: 2018-01-18 | Discharge: 2018-01-18 | Disposition: A | Discharge status: Home / Self Care | Payer: Medicare Other | Source: Ambulatory Visit | Attending: Gastroenterology | Admitting: Gastroenterology

## 2018-01-18 DIAGNOSIS — N281 Cyst of kidney, acquired: Secondary | ICD-10-CM | POA: Insufficient documentation

## 2018-01-18 DIAGNOSIS — I700 Atherosclerosis of aorta: Secondary | ICD-10-CM | POA: Diagnosis not present

## 2018-01-18 DIAGNOSIS — D1803 Hemangioma of intra-abdominal structures: Secondary | ICD-10-CM | POA: Insufficient documentation

## 2018-01-18 DIAGNOSIS — Z9049 Acquired absence of other specified parts of digestive tract: Secondary | ICD-10-CM | POA: Diagnosis not present

## 2018-01-18 DIAGNOSIS — K869 Disease of pancreas, unspecified: Secondary | ICD-10-CM | POA: Insufficient documentation

## 2018-01-18 DIAGNOSIS — D500 Iron deficiency anemia secondary to blood loss (chronic): Secondary | ICD-10-CM | POA: Diagnosis not present

## 2018-01-18 DIAGNOSIS — R55 Syncope and collapse: Secondary | ICD-10-CM | POA: Diagnosis not present

## 2018-01-18 MED ORDER — IOPAMIDOL (ISOVUE-300) INJECTION 61%
80.0000 mL | Freq: Once | INTRAVENOUS | Status: AC | PRN
  Administered 2018-01-18: 80 mL via INTRAVENOUS

## 2018-01-29 IMAGING — DX DG SHOULDER 2+V*L*
3 series · 3 of 3 positions shown · non-contrast
Comparison: 01/07/2014

CLINICAL DATA: Left shoulder pain for 2 weeks, no known injury

EXAM:
LEFT SHOULDER - 2+ VIEW

[shoulder ap]
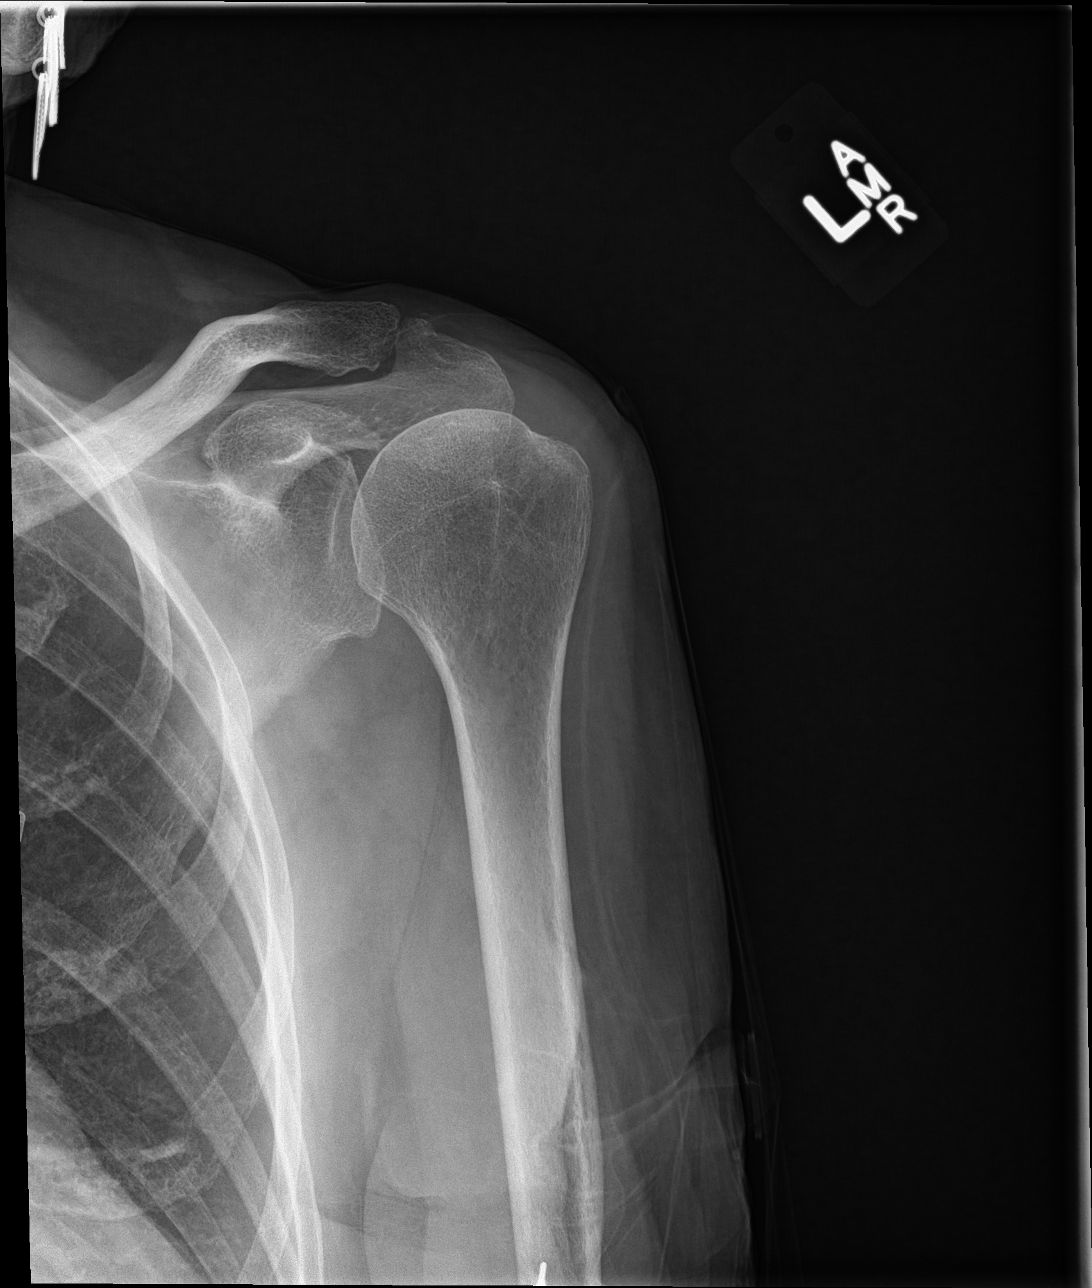

[shoulder y view]
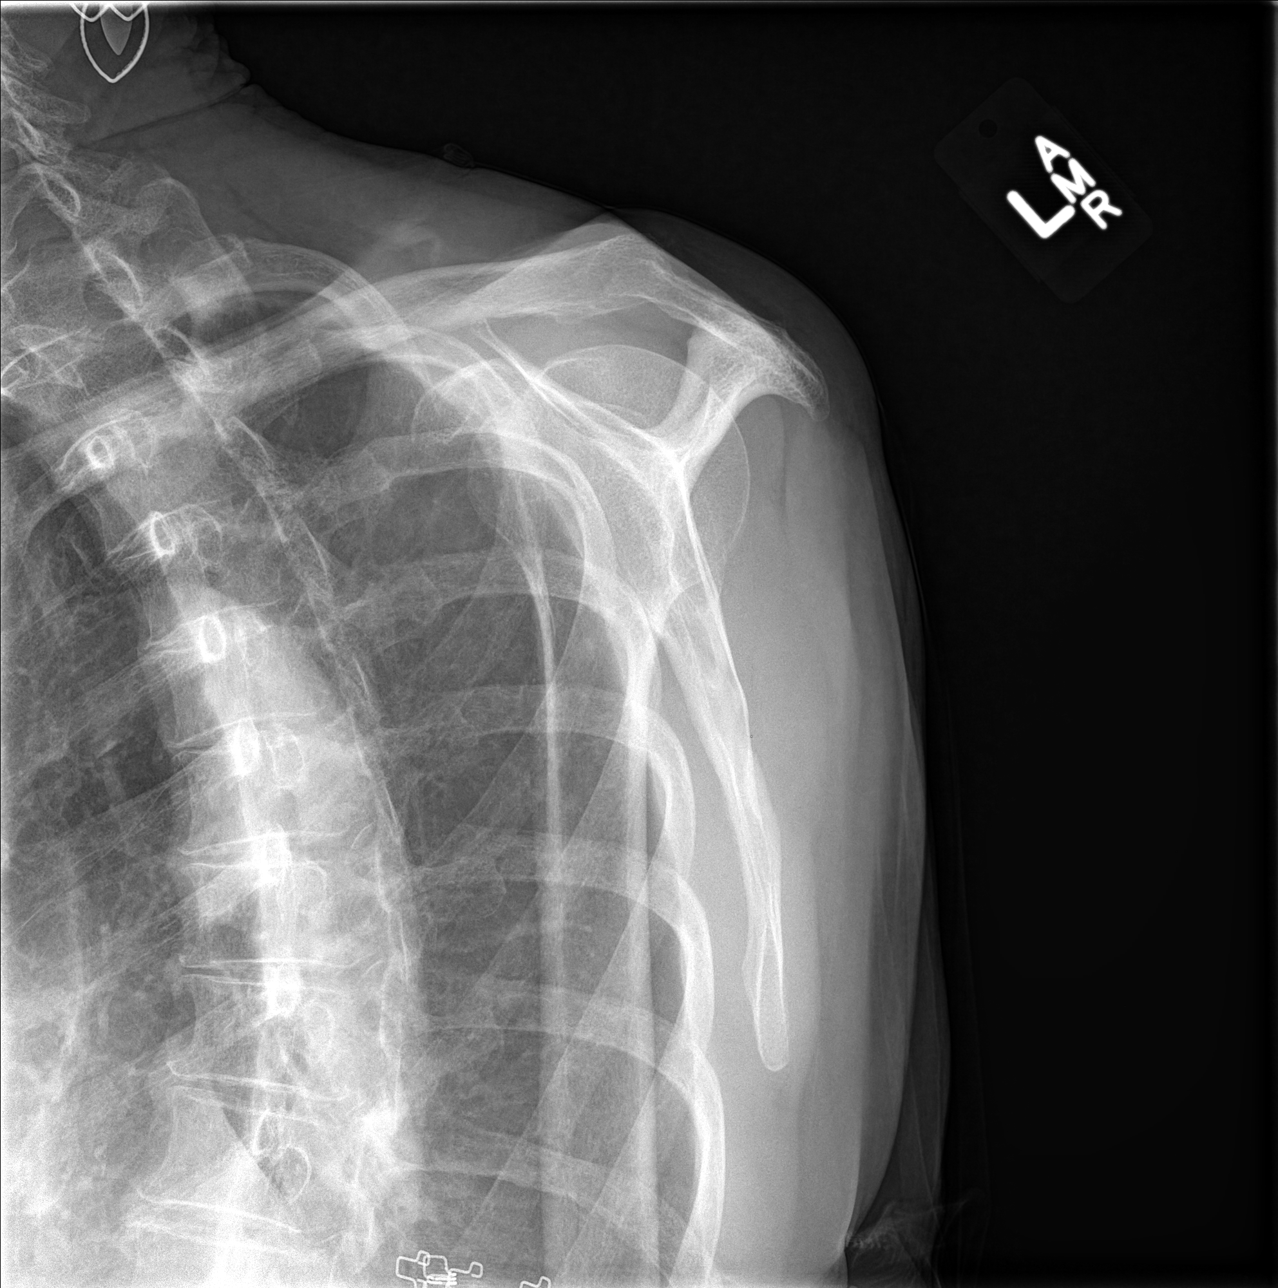

[shoulder axillary]
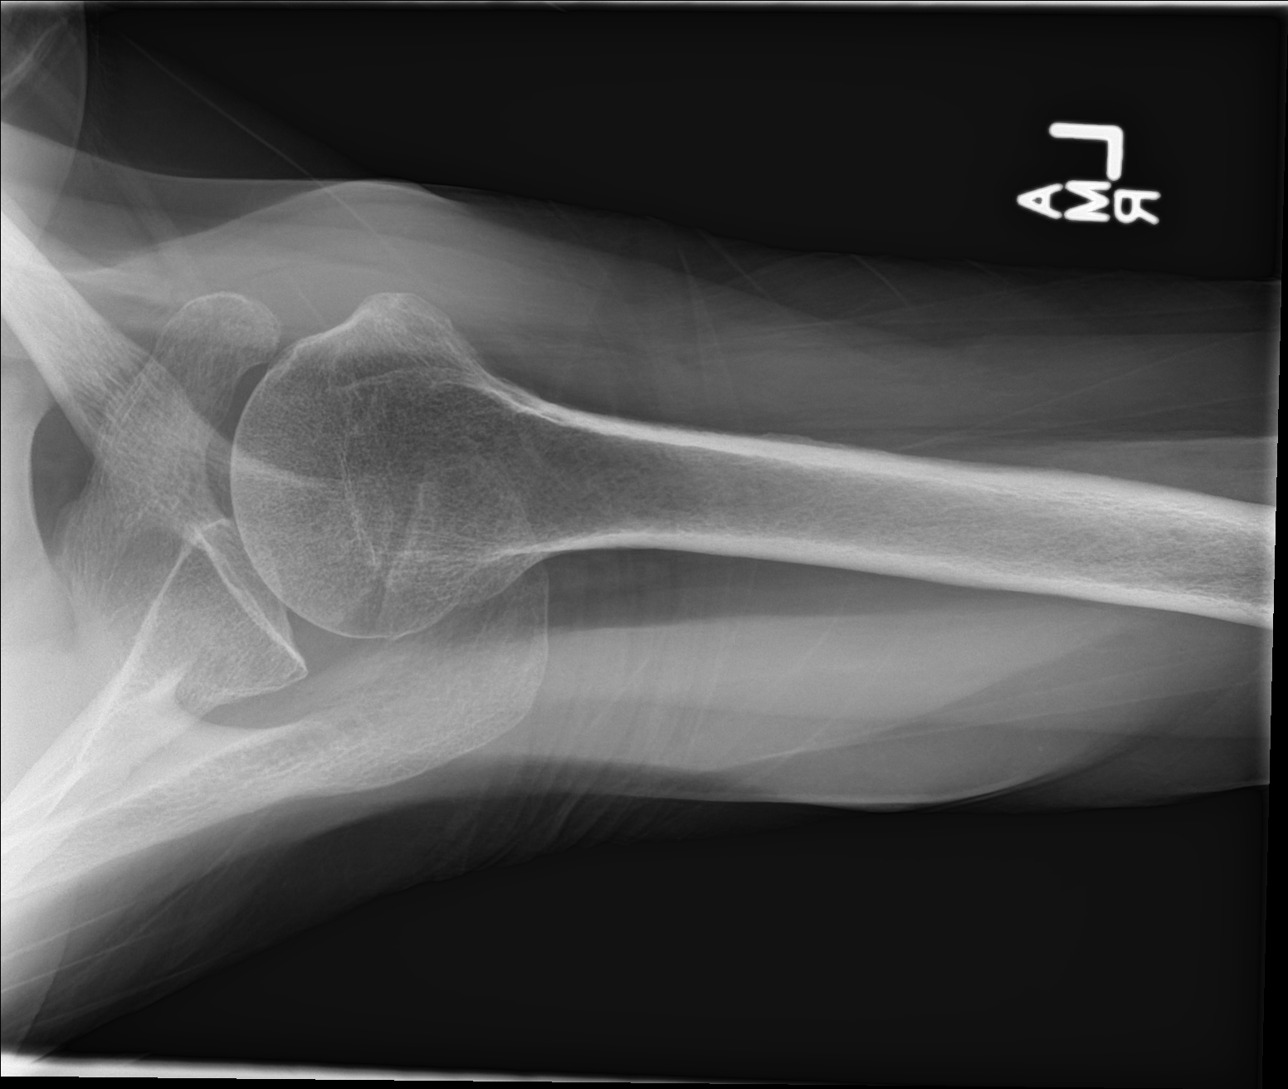

[3 of 3 positions shown; findings below may reference images not displayed]

FINDINGS: Three views of the left shoulder submitted. No acute fracture or
subluxation. Mild degenerative changes AC joint.
IMPRESSION: No acute fracture or subluxation. Mild degenerative changes
acromioclavicular joint.

## 2018-02-27 ENCOUNTER — Telehealth: Payer: Self-pay | Admitting: Internal Medicine

## 2018-02-27 NOTE — Telephone Encounter (Signed)
Lmom, waiting on a return call.  

## 2018-02-27 NOTE — Telephone Encounter (Signed)
Pt called to say that she has a knot that comes and goes on her right side and wanted [place] to look at her last MRI and see if anything showed up. I offered to make her an OV with AB for next Wednesday, but she wants LSL to look at her MRI and recommend what she should do. She is afraid it could be an aneurysm. [phone]

## 2018-02-27 NOTE — Telephone Encounter (Signed)
LSL, I spoke with pt and she would like her MRI results reviewed because she feels a knot on the rist sidee of her abdomen that comes and goes. Pt is fearful of what it may be. When asked how long has the knot been present, pt said she's only felt it twice over the last 1-2 weeks. When felt, pt noticed it got swollen and she wonder if it is an aneurysm. Please advised. Pt was offered an apt.

## 2018-02-28 NOTE — Telephone Encounter (Signed)
Spoke with pt and she agreed to come in for evaluation this Friday 03/02/18 @ 11:00 AM.

## 2018-02-28 NOTE — Telephone Encounter (Signed)
Please let patient know that she does not have an aneurysm based on her MRI 07/2017 and CT 01/2018.  I will be glad to review her recent CT.  I would also like to offer her OV on Friday with me for evaluation as this is a NEW finding and patient has not been seen her since 2018.

## 2018-03-02 ENCOUNTER — Encounter: Payer: Self-pay | Admitting: Gastroenterology

## 2018-03-02 ENCOUNTER — Ambulatory Visit (INDEPENDENT_AMBULATORY_CARE_PROVIDER_SITE_OTHER): Payer: Medicare Other | Admitting: Gastroenterology

## 2018-03-02 ENCOUNTER — Ambulatory Visit: Payer: Medicare Other | Admitting: Gastroenterology

## 2018-03-02 ENCOUNTER — Telehealth: Payer: Self-pay | Admitting: Gastroenterology

## 2018-03-02 VITALS — BP 98/62 | HR 69 | Temp 97.1°F | Ht 64.0 in | Wt 121.6 lb

## 2018-03-02 DIAGNOSIS — K5909 Other constipation: Secondary | ICD-10-CM | POA: Diagnosis not present

## 2018-03-02 DIAGNOSIS — I7102 Dissection of abdominal aorta: Secondary | ICD-10-CM | POA: Diagnosis not present

## 2018-03-02 DIAGNOSIS — R1903 Right lower quadrant abdominal swelling, mass and lump: Secondary | ICD-10-CM

## 2018-03-02 DIAGNOSIS — D509 Iron deficiency anemia, unspecified: Secondary | ICD-10-CM | POA: Diagnosis not present

## 2018-03-02 DIAGNOSIS — I7100 Dissection of unspecified site of aorta: Secondary | ICD-10-CM | POA: Insufficient documentation

## 2018-03-02 MED ORDER — LUBIPROSTONE 24 MCG PO CAPS
24.0000 ug | ORAL_CAPSULE | Freq: Every day | ORAL | 0 refills | Status: DC
Start: 2018-03-02 — End: 2018-04-16

## 2018-03-02 NOTE — Assessment & Plan Note (Signed)
Recent admission in 12/2017 for symptomatic anemia, IDA. We were not involved in care at that time. She was heme negative. Her last colonoscopy was in 2016/EGD 2018 as outlined above. Remote capsule study in 2007. Update labs at this time. Further recommendations to follow.

## 2018-03-02 NOTE — Telephone Encounter (Signed)
Please make referral to cardiovascular specialist for chronic abd aortic dissection seen on CTA 01/2017. Send copy of report.

## 2018-03-02 NOTE — Assessment & Plan Note (Signed)
Stop miralax. Start amitiza 4mcg once daily. Samples provided. She is to call with PR.

## 2018-03-02 NOTE — Progress Notes (Addendum)
Primary Care Physician: Caryl Bis, MD  Primary Gastroenterologist:  Garfield Cornea, MD   Chief Complaint  Patient presents with  . knot on right side of abdomen    comes and goes    HPI: Andrea Jackson is a 84 y.o. female here for semi-urgent follow up. She called earlier this week with concerns for right sided abdominal knot that she has felt on two separate days in the past couple of weeks.   She states the knot is walnut size. Protrudes at least 1-2 inches when it happens. In RLQ. First time she noticed it she was laying in bed and felt the knot. She applied pressure to it and fell back to sleep. By the next morning it was gone. Then second episode occurred during the day. States she could push it back in. Only lasted few hours. Area sore after the episode.   She has issues with constipation. If she takes her pain medication regularly, she has hard time going. Takes miralax daily, yogurt and probiotics. Sometimes adds dulcolax tablet. Finds herself withholding pain medicaiton due to the constipation. No melena, brbpr. Denies ugi symptoms.   She was hospitalized in 12/2017 with presyncopal episode. No overt gi bleeding and stool heme negative then. Hgb 8.2 down from 11.8 back in 02/2017. She states she received blood but had antibodies which made it difficult. Her Hgb was 10.1 at discharge on 12/19/17. She reports she was told she would be getting IV iron infusions periodically but no appt made. Ferritin of 3 on 12/17/17.   Patient also with h/o pancreatic lesion being followed with recent CT scan, pancreatic protocol.  Lesion initially was incidentally found at time of the CTA chest/abdomen/pelvis back in May 2018.  She had a 5 x 2 mm lucency in the head of the pancreas.  We followed this lesion with an MRI pancreas November 2018, pancreas is felt to be normal noting motion degradation.  Recent CT abdomen may 2019 (reread June 2019) reported pancreas to be normal.  Chronic increased  caliber of the CBD status post cholecystectomy noted.  She also has a history of left laterally directed focal dissection at the bifurcation of the aorta measuring 19 mm in length seen on the CTA back in May 2018.  I personally reviewed multiple studies with Dr. Lavonia Dana with radiology.  Focal dissection as outlined above dates back at least 3 years and stable.  Regarding pancreatic lesion, this was visible on current study dated 01/18/2018, lesion very subtle, may not even be an abnormality but at any rate stable back to 2013 studies.  Was seen on MRI last year as well.  Addendum made to current CT which document stability dating back to September 2013 and felt to be benign abnormality.  Prior GI history Patient has a history of IDA, heme positive stools EGD and colonoscopy in 2016, baggy esophagus and nodular inflamed antrum with biopsies consistent with reactive gastropathy. Colonoscopy with internal hemorrhoids, scattered pancolonic diverticula. She has a history of small bowel obstruction, chronic pain, chronic constipation, IDA. Small bowel ulcers on capsule study in 2007, with Prometheus panel suggesting Crohn's. She was not treated for Crohn's. CT enterography in March 2014 showed improvement in mild wall thickening that involved a portion of the mid small bowel that was proximal to the prior surgical anastomosis. Low suspicion for Crohn's disease per [facility] (Dr. Delrae Alfred). last EGD 01/2017 for dysphagia, had cervical esophageal web s/p dilation and mild gastritis.  Current Outpatient Medications  Medication Sig Dispense Refill  . ALPRAZolam (XANAX) 0.5 MG tablet Take 0.5-1 tablets by mouth at bedtime as needed for sleep or anxiety (May take one-half tablet during the day if needed). For anxiety    . bisacodyl (DULCOLAX) 5 MG EC tablet Take 5 mg by mouth daily as needed for moderate constipation.    . diclofenac (VOLTAREN) 75 MG EC tablet Take 1 tablet by mouth. Takes 1-2 times per day    .  diclofenac sodium (VOLTAREN) 1 % GEL diclofenac 1 % topical gel  APPLY 2-4 GRAMS FOUR TIMES DAILY AS NEEDED FOR INFLAMMATION    . esomeprazole (NEXIUM) 40 MG capsule Take 1 tablet by mouth as needed.     . hydrochlorothiazide (MICROZIDE) 12.5 MG capsule Take 12.5 mg by mouth daily.  1  . isosorbide mononitrate (IMDUR) 30 MG 24 hr tablet TAKE 1/2 TABLET BY MOUTH DAILY. 45 tablet 3  . levothyroxine (SYNTHROID, LEVOTHROID) 137 MCG tablet Take 137 mcg by mouth daily.    Marland Kitchen losartan (COZAAR) 100 MG tablet Take 50 mg by mouth daily. for high blood pressure  3  . loteprednol (LOTEMAX) 0.5 % ophthalmic suspension as needed.    . metoprolol succinate (TOPROL-XL) 25 MG 24 hr tablet Take 1 tablet by mouth daily.    . nitroGLYCERIN (NITROSTAT) 0.4 MG SL tablet Place 0.4 mg under the tongue as needed for chest pain.     . Omega-3 Fatty Acids (FISH OIL) 1000 MG CAPS Take 1 capsule by mouth daily.     . polyethylene glycol (MIRALAX / GLYCOLAX) packet Take 17 g by mouth daily as needed for mild constipation.     . promethazine (PHENERGAN) 25 MG tablet Take 25 mg by mouth every 6 (six) hours as needed for nausea or vomiting.    . RESTASIS 0.05 % ophthalmic emulsion Place 1 drop into both eyes every evening.     . traMADol (ULTRAM) 50 MG tablet Take 50 mg by mouth every 6 (six) hours as needed. For pain    . vitamin B-12 (CYANOCOBALAMIN) 250 MCG tablet Take 250 mcg by mouth as needed.     No current facility-administered medications for this visit.     Allergies as of 03/02/2018 - Review Complete 03/02/2018  Allergen Reaction Noted  . Demerol [meperidine]  01/31/2017  . Celecoxib    . Codeine    . Oxycodone-acetaminophen Itching 12/15/2010  . Statins Other (See Comments) 02/20/2013    ROS:  General: Negative for anorexia, weight loss, fever, chills, fatigue, weakness. ENT: Negative for hoarseness, difficulty swallowing , nasal congestion. CV: Negative for chest pain, angina, palpitations, dyspnea on  exertion, peripheral edema.  Respiratory: Negative for dyspnea at rest, dyspnea on exertion, cough, sputum, wheezing.  GI: See history of present illness. GU:  Negative for dysuria, hematuria, urinary incontinence, urinary frequency, nocturnal urination.  Endo: Negative for unusual weight change.    Physical Examination:   BP 98/62   Pulse 69   Temp (!) 97.1 F (36.2 C) (Oral)   Ht 5\' 4"  (1.626 m)   Wt 121 lb 9.6 oz (55.2 kg)   BMI 20.87 kg/m   General: Well-nourished, well-developed in no acute distress.  Eyes: No icterus. Mouth: Oropharyngeal mucosa moist and pink , no lesions erythema or exudate. Lungs: Clear to auscultation bilaterally.  Heart: Regular rate and rhythm, no murmurs rubs or gallops.  Abdomen: Bowel sounds are normal, nondistended, no hepatosplenomegaly or masses, no abdominal bruits or hernia , no  rebound or guarding.  Mild rlq tenderness without obvious bulge or hernia.  Extremities: No lower extremity edema. No clubbing or deformities. Neuro: Alert and oriented x 4   Skin: Warm and dry, no jaundice.   Psych: Alert and cooperative, normal mood and affect.  Labs:  Lab Results  Component Value Date   CREATININE 1.10 (H) 12/17/2017   BUN 27 (H) 12/17/2017   NA 138 12/17/2017   K 4.0 12/17/2017   CL 104 12/17/2017   CO2 26 12/17/2017   Lab Results  Component Value Date   ALT 21 12/17/2017   AST 19 12/17/2017   ALKPHOS 46 12/17/2017   BILITOT 0.5 12/17/2017    Lab Results  Component Value Date   IRON 7 (L) 12/17/2017   TIBC 445 12/17/2017   FERRITIN 3 (L) 12/17/2017    Imaging Studies: No results found.

## 2018-03-02 NOTE — Assessment & Plan Note (Signed)
Suspicious of abdominal wall hernia. No mention of hernia on prior CTs but last one was prior to development of symptoms therefore does not rule out. I will again look at CT with radiologist to make sure one not mentioned but present.   Patient has also been advised with next episode where she feel or see this knot to come to office for quick look at this, work in with me if no appt available.

## 2018-03-02 NOTE — Patient Instructions (Addendum)
Please go for labs, we will contact you first of the week with results.   Please contact us the next time the knot comes up on your abdomen so that we can get you seen during the episode.   Try Amitiza once daily with breakfast. Samples of 31mcg capsules provided. IF YOU WANT TO CONTINUE THESE, PLEASE CALL FOR PRESCRIPTION. Hold miralax while on Amitiza.

## 2018-03-02 NOTE — Assessment & Plan Note (Signed)
Patient with chronic focal dissection at the abdominal aortic bifurcation. She continues to be concerned about having an aneurysm. None seen on recent CT. Would like for her to follow up with cardiovascular specialist for opinion regarding chronic aortic dissection. She is agreeable.

## 2018-03-03 LAB — CBC WITH DIFFERENTIAL/PLATELET
Basophils Absolute: 38 cells/uL (ref 0–200)
Basophils Relative: 0.7 %
EOS PCT: 2.6 %
Eosinophils Absolute: 140 cells/uL (ref 15–500)
HEMATOCRIT: 35.1 % (ref 35.0–45.0)
Hemoglobin: 10.7 g/dL — ABNORMAL LOW (ref 11.7–15.5)
LYMPHS ABS: 1053 {cells}/uL (ref 850–3900)
MCH: 22.4 pg — ABNORMAL LOW (ref 27.0–33.0)
MCHC: 30.5 g/dL — ABNORMAL LOW (ref 32.0–36.0)
MCV: 73.4 fL — ABNORMAL LOW (ref 80.0–100.0)
MONOS PCT: 11.5 %
MPV: 10.6 fL (ref 7.5–12.5)
Neutro Abs: 3548 cells/uL (ref 1500–7800)
Neutrophils Relative %: 65.7 %
PLATELETS: 315 10*3/uL (ref 140–400)
RBC: 4.78 10*6/uL (ref 3.80–5.10)
RDW: 21.3 % — ABNORMAL HIGH (ref 11.0–15.0)
Total Lymphocyte: 19.5 %
WBC mixed population: 621 cells/uL (ref 200–950)
WBC: 5.4 10*3/uL (ref 3.8–10.8)

## 2018-03-03 LAB — IRON,TIBC AND FERRITIN PANEL
%SAT: 6 % — AB (ref 16–45)
Ferritin: 8 ng/mL — ABNORMAL LOW (ref 16–288)
Iron: 26 ug/dL — ABNORMAL LOW (ref 45–160)
TIBC: 422 ug/dL (ref 250–450)

## 2018-03-05 NOTE — Telephone Encounter (Signed)
Referral also sent via [facility].

## 2018-03-05 NOTE — Telephone Encounter (Signed)
Called [facility] in [place]. Receptionist advised to send referral via Epic. Referral sent.

## 2018-03-05 NOTE — Addendum Note (Signed)
Addended by: Hassan Rowan on: 03/05/2018 09:21 AM   Modules accepted: Orders

## 2018-03-07 ENCOUNTER — Other Ambulatory Visit: Payer: Self-pay | Admitting: Cardiology

## 2018-03-09 DIAGNOSIS — I10 Essential (primary) hypertension: Secondary | ICD-10-CM | POA: Diagnosis not present

## 2018-03-09 DIAGNOSIS — E039 Hypothyroidism, unspecified: Secondary | ICD-10-CM | POA: Diagnosis not present

## 2018-03-09 DIAGNOSIS — K219 Gastro-esophageal reflux disease without esophagitis: Secondary | ICD-10-CM | POA: Diagnosis not present

## 2018-03-09 DIAGNOSIS — E782 Mixed hyperlipidemia: Secondary | ICD-10-CM | POA: Diagnosis not present

## 2018-03-29 ENCOUNTER — Other Ambulatory Visit: Payer: Self-pay

## 2018-03-29 ENCOUNTER — Ambulatory Visit (INDEPENDENT_AMBULATORY_CARE_PROVIDER_SITE_OTHER): Payer: Medicare Other | Admitting: Vascular Surgery

## 2018-03-29 VITALS — BP 153/75 | HR 73 | Temp 98.0°F | Resp 16 | Ht 64.0 in | Wt 123.0 lb

## 2018-03-29 DIAGNOSIS — I739 Peripheral vascular disease, unspecified: Secondary | ICD-10-CM
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
## 2018-04-04 ENCOUNTER — Telehealth: Payer: Self-pay | Admitting: Internal Medicine

## 2018-04-04 ENCOUNTER — Other Ambulatory Visit: Payer: Self-pay | Admitting: *Deleted

## 2018-04-04 DIAGNOSIS — D509 Iron deficiency anemia, unspecified: Secondary | ICD-10-CM

## 2018-04-04 NOTE — Progress Notes (Signed)
ON RECALL FOR RMR FU OV

## 2018-04-04 NOTE — Progress Notes (Signed)
ON RECALL  °

## 2018-04-04 NOTE — Telephone Encounter (Signed)
[phone] PATIENT RETURNED CALL. PLEASE CALL BACK

## 2018-04-05 NOTE — Progress Notes (Signed)
ON RECALL FOR RMR APPOINTMENT  

## 2018-04-06 ENCOUNTER — Ambulatory Visit (INDEPENDENT_AMBULATORY_CARE_PROVIDER_SITE_OTHER): Payer: Medicare Other | Admitting: Gastroenterology

## 2018-04-06 DIAGNOSIS — D509 Iron deficiency anemia, unspecified: Secondary | ICD-10-CM | POA: Diagnosis not present

## 2018-04-06 LAB — IFOBT (OCCULT BLOOD): IFOBT: NEGATIVE

## 2018-04-09 DIAGNOSIS — K210 Gastro-esophageal reflux disease with esophagitis: Secondary | ICD-10-CM | POA: Diagnosis not present

## 2018-04-09 DIAGNOSIS — Z9189 Other specified personal risk factors, not elsewhere classified: Secondary | ICD-10-CM | POA: Diagnosis not present

## 2018-04-09 DIAGNOSIS — D509 Iron deficiency anemia, unspecified: Secondary | ICD-10-CM | POA: Diagnosis not present

## 2018-04-09 DIAGNOSIS — D519 Vitamin B12 deficiency anemia, unspecified: Secondary | ICD-10-CM | POA: Diagnosis not present

## 2018-04-09 DIAGNOSIS — D529 Folate deficiency anemia, unspecified: Secondary | ICD-10-CM | POA: Diagnosis not present

## 2018-04-09 DIAGNOSIS — K56600 Partial intestinal obstruction, unspecified as to cause: Secondary | ICD-10-CM | POA: Diagnosis not present

## 2018-04-09 DIAGNOSIS — E039 Hypothyroidism, unspecified: Secondary | ICD-10-CM | POA: Diagnosis not present

## 2018-04-09 DIAGNOSIS — E782 Mixed hyperlipidemia: Secondary | ICD-10-CM | POA: Diagnosis not present

## 2018-04-09 NOTE — Progress Notes (Signed)
Forwarding to [place] to schedule appt with RMR.

## 2018-04-10 DIAGNOSIS — E782 Mixed hyperlipidemia: Secondary | ICD-10-CM | POA: Diagnosis not present

## 2018-04-10 DIAGNOSIS — I10 Essential (primary) hypertension: Secondary | ICD-10-CM | POA: Diagnosis not present

## 2018-04-10 DIAGNOSIS — E039 Hypothyroidism, unspecified: Secondary | ICD-10-CM | POA: Diagnosis not present

## 2018-04-11 DIAGNOSIS — I10 Essential (primary) hypertension: Secondary | ICD-10-CM | POA: Diagnosis not present

## 2018-04-11 DIAGNOSIS — K219 Gastro-esophageal reflux disease without esophagitis: Secondary | ICD-10-CM | POA: Diagnosis not present

## 2018-04-11 DIAGNOSIS — E782 Mixed hyperlipidemia: Secondary | ICD-10-CM | POA: Diagnosis not present

## 2018-04-11 DIAGNOSIS — E039 Hypothyroidism, unspecified: Secondary | ICD-10-CM | POA: Diagnosis not present

## 2018-04-11 DIAGNOSIS — M810 Age-related osteoporosis without current pathological fracture: Secondary | ICD-10-CM | POA: Diagnosis not present

## 2018-04-16 ENCOUNTER — Inpatient Hospital Stay (HOSPITAL_COMMUNITY): Payer: Medicare Other | Attending: Internal Medicine | Admitting: Internal Medicine

## 2018-04-16 ENCOUNTER — Other Ambulatory Visit: Payer: Self-pay

## 2018-04-16 ENCOUNTER — Encounter (HOSPITAL_COMMUNITY): Payer: Self-pay | Admitting: Internal Medicine

## 2018-04-16 ENCOUNTER — Inpatient Hospital Stay (HOSPITAL_COMMUNITY): Payer: Medicare Other

## 2018-04-16 VITALS — BP 179/91 | HR 60 | Temp 97.9°F | Resp 16 | Ht 64.0 in | Wt 123.0 lb

## 2018-04-16 VITALS — BP 156/64 | HR 66 | Resp 16

## 2018-04-16 DIAGNOSIS — E039 Hypothyroidism, unspecified: Secondary | ICD-10-CM

## 2018-04-16 DIAGNOSIS — D509 Iron deficiency anemia, unspecified: Secondary | ICD-10-CM | POA: Diagnosis not present

## 2018-04-16 DIAGNOSIS — I10 Essential (primary) hypertension: Secondary | ICD-10-CM | POA: Diagnosis not present

## 2018-04-16 DIAGNOSIS — D508 Other iron deficiency anemias: Secondary | ICD-10-CM

## 2018-04-16 MED ORDER — FERRIC CARBOXYMALTOSE 750 MG/15ML IV SOLN
750.0000 mg | Freq: Once | INTRAVENOUS | Status: AC
  Administered 2018-04-16: 750 mg via INTRAVENOUS
  Filled 2018-04-16: qty 15

## 2018-04-16 MED ORDER — SODIUM CHLORIDE 0.9 % IV SOLN
Freq: Once | INTRAVENOUS | Status: AC
  Administered 2018-04-16: 13:00:00 via INTRAVENOUS
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
## 2018-04-23 ENCOUNTER — Encounter (HOSPITAL_COMMUNITY): Payer: Self-pay

## 2018-04-23 ENCOUNTER — Inpatient Hospital Stay (HOSPITAL_COMMUNITY): Payer: Medicare Other

## 2018-04-23 VITALS — BP 142/63 | HR 62 | Temp 98.1°F | Resp 18

## 2018-04-23 DIAGNOSIS — D509 Iron deficiency anemia, unspecified: Secondary | ICD-10-CM | POA: Diagnosis not present

## 2018-04-23 DIAGNOSIS — D508 Other iron deficiency anemias: Secondary | ICD-10-CM

## 2018-04-23 DIAGNOSIS — E039 Hypothyroidism, unspecified: Secondary | ICD-10-CM | POA: Diagnosis not present

## 2018-04-23 DIAGNOSIS — I10 Essential (primary) hypertension: Secondary | ICD-10-CM | POA: Diagnosis not present

## 2018-04-23 MED ORDER — SODIUM CHLORIDE 0.9 % IV SOLN
750.0000 mg | Freq: Once | INTRAVENOUS | Status: AC
  Administered 2018-04-23: 750 mg via INTRAVENOUS
  Filled 2018-04-23: qty 15

## 2018-04-23 MED ORDER — SODIUM CHLORIDE 0.9 % IV SOLN
Freq: Once | INTRAVENOUS | Status: AC
  Administered 2018-04-23: 14:00:00 via INTRAVENOUS

## 2018-04-23 NOTE — Patient Instructions (Signed)
[facility] Discharge Instructions  Received Injectafer infusion today. Follow-up as scheduled. Call clinic for any questions or concerns   Thank you for choosing [facility] at [facility] to provide your oncology and hematology care.  To afford each patient quality time with our provider, please arrive at least 15 minutes before your scheduled appointment time.   If you have a lab appointment with the Cancer Center please come in thru the  Main Entrance and check in at the main information desk  You need to re-schedule your appointment should you arrive 10 or more minutes late.  We strive to give you quality time with our providers, and arriving late affects you and other patients whose appointments are after yours.  Also, if you no show three or more times for appointments you may be dismissed from the clinic at the providers discretion.     Again, thank you for choosing [facility].  Our hope is that these requests will decrease the amount of time that you wait before being seen by our physicians.       _____________________________________________________________  Should you have questions after your visit to [facility], please contact our office at [phone] between the hours of 8:00 a.m. and 4:30 p.m.  Voicemails left after 4:00 p.m. will not be returned until the following business day.  For prescription refill requests, have your pharmacy contact our office and allow 72 hours.    Cancer Center Support Programs:   > Cancer Support Group  2nd Tuesday of the month 1pm-2pm, Journey Room   

## 2018-04-23 NOTE — Progress Notes (Signed)
Jonne Ply tolerated Injectafer infusion well without incident or complaint. VSS upon completion of therapy. Discharged self ambulatory in satisfactory condition.

## 2018-04-27 DIAGNOSIS — Z6821 Body mass index (BMI) 21.0-21.9, adult: Secondary | ICD-10-CM | POA: Diagnosis not present

## 2018-04-27 DIAGNOSIS — I10 Essential (primary) hypertension: Secondary | ICD-10-CM | POA: Diagnosis not present

## 2018-05-17 DIAGNOSIS — Z961 Presence of intraocular lens: Secondary | ICD-10-CM | POA: Diagnosis not present

## 2018-05-23 DIAGNOSIS — E039 Hypothyroidism, unspecified: Secondary | ICD-10-CM | POA: Diagnosis not present

## 2018-05-26 ENCOUNTER — Telehealth: Payer: Self-pay | Admitting: Gastroenterology

## 2018-05-26 NOTE — Telephone Encounter (Signed)
Request OV with RMR only (see 03/2018 result note).  Please schedule (f/u IDA)

## 2018-05-28 ENCOUNTER — Encounter: Payer: Self-pay | Admitting: Internal Medicine

## 2018-05-28 NOTE — Telephone Encounter (Signed)
PATIENT SCHEDULED AND LETTER SENT  °

## 2018-06-08 DIAGNOSIS — Z1231 Encounter for screening mammogram for malignant neoplasm of breast: Secondary | ICD-10-CM | POA: Diagnosis not present
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
## 2018-06-12 ENCOUNTER — Encounter: Payer: Self-pay | Admitting: Cardiology

## 2018-06-12 ENCOUNTER — Ambulatory Visit (INDEPENDENT_AMBULATORY_CARE_PROVIDER_SITE_OTHER): Payer: Medicare Other | Admitting: Cardiology

## 2018-06-12 VITALS — BP 112/68 | HR 61 | Ht 64.0 in | Wt 122.0 lb

## 2018-06-12 DIAGNOSIS — I2510 Atherosclerotic heart disease of native coronary artery without angina pectoris: Secondary | ICD-10-CM

## 2018-06-12 DIAGNOSIS — I10 Essential (primary) hypertension: Secondary | ICD-10-CM

## 2018-06-12 DIAGNOSIS — M7581 Other shoulder lesions, right shoulder: Secondary | ICD-10-CM | POA: Diagnosis not present

## 2018-06-12 DIAGNOSIS — E782 Mixed hyperlipidemia: Secondary | ICD-10-CM | POA: Diagnosis not present

## 2018-06-12 DIAGNOSIS — M1711 Unilateral primary osteoarthritis, right knee: Secondary | ICD-10-CM | POA: Diagnosis not present

## 2018-06-12 DIAGNOSIS — M7582 Other shoulder lesions, left shoulder: Secondary | ICD-10-CM | POA: Diagnosis not present

## 2018-06-12 NOTE — Patient Instructions (Addendum)

## 2018-06-18 ENCOUNTER — Inpatient Hospital Stay (HOSPITAL_COMMUNITY): Payer: Medicare Other | Attending: Hematology

## 2018-06-18 DIAGNOSIS — D509 Iron deficiency anemia, unspecified: Secondary | ICD-10-CM | POA: Diagnosis not present

## 2018-06-18 DIAGNOSIS — Z79899 Other long term (current) drug therapy: Secondary | ICD-10-CM | POA: Diagnosis not present

## 2018-06-18 DIAGNOSIS — Z800 Family history of malignant neoplasm of digestive organs: Secondary | ICD-10-CM | POA: Insufficient documentation

## 2018-06-18 DIAGNOSIS — I10 Essential (primary) hypertension: Secondary | ICD-10-CM | POA: Diagnosis not present

## 2018-06-18 DIAGNOSIS — E039 Hypothyroidism, unspecified: Secondary | ICD-10-CM | POA: Insufficient documentation

## 2018-06-18 DIAGNOSIS — D508 Other iron deficiency anemias: Secondary | ICD-10-CM

## 2018-06-18 LAB — CBC WITH DIFFERENTIAL/PLATELET
BASOS ABS: 0 10*3/uL (ref 0.0–0.1)
Basophils Relative: 0 %
Eosinophils Absolute: 0.1 10*3/uL (ref 0.0–0.7)
Eosinophils Relative: 2 %
HEMATOCRIT: 44.6 % (ref 36.0–46.0)
Hemoglobin: 14.3 g/dL (ref 12.0–15.0)
LYMPHS ABS: 1.2 10*3/uL (ref 0.7–4.0)
Lymphocytes Relative: 15 %
MCH: 29.4 pg (ref 26.0–34.0)
MCHC: 32.1 g/dL (ref 30.0–36.0)
MCV: 91.6 fL (ref 78.0–100.0)
MONO ABS: 0.8 10*3/uL (ref 0.1–1.0)
MONOS PCT: 10 %
NEUTROS ABS: 5.8 10*3/uL (ref 1.7–7.7)
Neutrophils Relative %: 73 %
Platelets: 241 10*3/uL (ref 150–400)
RBC: 4.87 MIL/uL (ref 3.87–5.11)
RDW: 19.9 % — AB (ref 11.5–15.5)
WBC: 7.9 10*3/uL (ref 4.0–10.5)

## 2018-06-18 LAB — COMPREHENSIVE METABOLIC PANEL
ALT: 28 U/L (ref 0–44)
AST: 22 U/L (ref 15–41)
Albumin: 3.9 g/dL (ref 3.5–5.0)
Alkaline Phosphatase: 64 U/L (ref 38–126)
Anion gap: 7 (ref 5–15)
BUN: 25 mg/dL — AB (ref 8–23)
CO2: 30 mmol/L (ref 22–32)
CREATININE: 0.92 mg/dL (ref 0.44–1.00)
Calcium: 9.1 mg/dL (ref 8.9–10.3)
Chloride: 99 mmol/L (ref 98–111)
GFR calc Af Amer: >60 mL/min (ref >60)
GFR, EST NON AFRICAN AMERICAN: 56 mL/min — AB (ref >60)
Glucose, Bld: 98 mg/dL (ref 70–99)
POTASSIUM: 4.9 mmol/L (ref 3.5–5.1)
Sodium: 136 mmol/L (ref 135–145)
Total Bilirubin: 0.7 mg/dL (ref 0.3–1.2)
Total Protein: 6.7 g/dL (ref 6.5–8.1)

## 2018-06-18 LAB — FERRITIN: Ferritin: 164 ng/mL (ref 11–307)

## 2018-06-18 LAB — LACTATE DEHYDROGENASE: LDH: 156 U/L (ref 98–192)

## 2018-06-20 ENCOUNTER — Other Ambulatory Visit: Payer: Self-pay

## 2018-06-20 ENCOUNTER — Inpatient Hospital Stay (HOSPITAL_BASED_OUTPATIENT_CLINIC_OR_DEPARTMENT_OTHER): Payer: Medicare Other | Admitting: Internal Medicine

## 2018-06-20 ENCOUNTER — Encounter (HOSPITAL_COMMUNITY): Payer: Self-pay | Admitting: Internal Medicine

## 2018-06-20 VITALS — BP 147/82 | HR 62 | Temp 97.7°F | Resp 18 | Wt 120.0 lb

## 2018-06-20 DIAGNOSIS — Z79899 Other long term (current) drug therapy: Secondary | ICD-10-CM | POA: Diagnosis not present

## 2018-06-20 DIAGNOSIS — I10 Essential (primary) hypertension: Secondary | ICD-10-CM

## 2018-06-20 DIAGNOSIS — Z800 Family history of malignant neoplasm of digestive organs: Secondary | ICD-10-CM | POA: Diagnosis not present

## 2018-06-20 DIAGNOSIS — D509 Iron deficiency anemia, unspecified: Secondary | ICD-10-CM

## 2018-06-20 DIAGNOSIS — D508 Other iron deficiency anemias: Secondary | ICD-10-CM

## 2018-06-20 DIAGNOSIS — E039 Hypothyroidism, unspecified: Secondary | ICD-10-CM
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
## 2018-06-26 DIAGNOSIS — Z23 Encounter for immunization: Secondary | ICD-10-CM | POA: Diagnosis not present

## 2018-06-30 ENCOUNTER — Emergency Department (HOSPITAL_COMMUNITY): Payer: Medicare Other

## 2018-06-30 ENCOUNTER — Observation Stay (HOSPITAL_COMMUNITY)
Admission: EM | Admit: 2018-06-30 | Discharge: 2018-07-05 | Disposition: A | Discharge status: Skilled Nursing Facility | Payer: Medicare Other | Attending: Internal Medicine | Admitting: Internal Medicine

## 2018-06-30 ENCOUNTER — Other Ambulatory Visit: Payer: Self-pay

## 2018-06-30 ENCOUNTER — Encounter (HOSPITAL_COMMUNITY): Payer: Self-pay | Admitting: Emergency Medicine

## 2018-06-30 DIAGNOSIS — K219 Gastro-esophageal reflux disease without esophagitis: Secondary | ICD-10-CM | POA: Insufficient documentation

## 2018-06-30 DIAGNOSIS — R0902 Hypoxemia: Secondary | ICD-10-CM | POA: Insufficient documentation

## 2018-06-30 DIAGNOSIS — Z9841 Cataract extraction status, right eye: Secondary | ICD-10-CM | POA: Insufficient documentation

## 2018-06-30 DIAGNOSIS — Z885 Allergy status to narcotic agent status: Secondary | ICD-10-CM | POA: Diagnosis not present

## 2018-06-30 DIAGNOSIS — E890 Postprocedural hypothyroidism: Secondary | ICD-10-CM | POA: Insufficient documentation

## 2018-06-30 DIAGNOSIS — Z791 Long term (current) use of non-steroidal anti-inflammatories (NSAID): Secondary | ICD-10-CM | POA: Diagnosis not present

## 2018-06-30 DIAGNOSIS — K5900 Constipation, unspecified: Secondary | ICD-10-CM | POA: Insufficient documentation

## 2018-06-30 DIAGNOSIS — Z79899 Other long term (current) drug therapy: Secondary | ICD-10-CM | POA: Diagnosis not present

## 2018-06-30 DIAGNOSIS — R42 Dizziness and giddiness: Secondary | ICD-10-CM

## 2018-06-30 DIAGNOSIS — G8929 Other chronic pain: Secondary | ICD-10-CM | POA: Diagnosis not present

## 2018-06-30 DIAGNOSIS — I2510 Atherosclerotic heart disease of native coronary artery without angina pectoris: Secondary | ICD-10-CM | POA: Diagnosis not present

## 2018-06-30 DIAGNOSIS — Z8719 Personal history of other diseases of the digestive system: Secondary | ICD-10-CM | POA: Diagnosis not present

## 2018-06-30 DIAGNOSIS — Z9842 Cataract extraction status, left eye: Secondary | ICD-10-CM | POA: Insufficient documentation

## 2018-06-30 DIAGNOSIS — I252 Old myocardial infarction: Secondary | ICD-10-CM | POA: Insufficient documentation

## 2018-06-30 DIAGNOSIS — Z85828 Personal history of other malignant neoplasm of skin: Secondary | ICD-10-CM | POA: Insufficient documentation

## 2018-06-30 DIAGNOSIS — R110 Nausea: Secondary | ICD-10-CM | POA: Diagnosis not present

## 2018-06-30 DIAGNOSIS — F419 Anxiety disorder, unspecified: Secondary | ICD-10-CM | POA: Insufficient documentation

## 2018-06-30 DIAGNOSIS — I808 Phlebitis and thrombophlebitis of other sites: Secondary | ICD-10-CM | POA: Insufficient documentation

## 2018-06-30 DIAGNOSIS — I10 Essential (primary) hypertension: Secondary | ICD-10-CM | POA: Insufficient documentation

## 2018-06-30 DIAGNOSIS — H8112 Benign paroxysmal vertigo, left ear: Secondary | ICD-10-CM | POA: Diagnosis not present

## 2018-06-30 DIAGNOSIS — Z79890 Hormone replacement therapy (postmenopausal): Secondary | ICD-10-CM | POA: Insufficient documentation

## 2018-06-30 DIAGNOSIS — Z66 Do not resuscitate: Secondary | ICD-10-CM | POA: Insufficient documentation

## 2018-06-30 DIAGNOSIS — Z961 Presence of intraocular lens: Secondary | ICD-10-CM | POA: Diagnosis not present

## 2018-06-30 DIAGNOSIS — Z888 Allergy status to other drugs, medicaments and biological substances status: Secondary | ICD-10-CM | POA: Diagnosis not present

## 2018-06-30 LAB — CBC
HCT: 41.8 % (ref 36.0–46.0)
Hemoglobin: 13.2 g/dL (ref 12.0–15.0)
MCH: 29.5 pg (ref 26.0–34.0)
MCHC: 31.6 g/dL (ref 30.0–36.0)
MCV: 93.3 fL (ref 80.0–100.0)
PLATELETS: 181 10*3/uL (ref 150–400)
RBC: 4.48 MIL/uL (ref 3.87–5.11)
RDW: 17.8 % — AB (ref 11.5–15.5)
WBC: 4.2 10*3/uL (ref 4.0–10.5)
nRBC: 0 % (ref 0.0–0.2)

## 2018-06-30 LAB — BASIC METABOLIC PANEL
Anion gap: 7 (ref 5–15)
BUN: 26 mg/dL — ABNORMAL HIGH (ref 8–23)
CALCIUM: 8.7 mg/dL — AB (ref 8.9–10.3)
CO2: 27 mmol/L (ref 22–32)
CREATININE: 0.82 mg/dL (ref 0.44–1.00)
Chloride: 102 mmol/L (ref 98–111)
GFR calc non Af Amer: >60 mL/min (ref >60)
GLUCOSE: 142 mg/dL — AB (ref 70–99)
Potassium: 3.7 mmol/L (ref 3.5–5.1)
Sodium: 136 mmol/L (ref 135–145)

## 2018-06-30 MED ORDER — ASPIRIN 325 MG PO TABS
325.0000 mg | ORAL_TABLET | Freq: Every day | ORAL | Status: DC
  Filled 2018-06-30: qty 1

## 2018-06-30 MED ORDER — TRAMADOL HCL 50 MG PO TABS
50.0000 mg | ORAL_TABLET | Freq: Once | ORAL | Status: AC
  Administered 2018-06-30: 50 mg via ORAL
  Filled 2018-06-30: qty 1

## 2018-06-30 MED ORDER — MECLIZINE HCL 12.5 MG PO TABS
12.5000 mg | ORAL_TABLET | Freq: Once | ORAL | Status: AC
  Administered 2018-06-30: 12.5 mg via ORAL
  Filled 2018-06-30: qty 1

## 2018-06-30 MED ORDER — ONDANSETRON HCL 4 MG/2ML IJ SOLN
4.0000 mg | Freq: Once | INTRAMUSCULAR | Status: AC
  Administered 2018-06-30: 4 mg via INTRAVENOUS
  Filled 2018-06-30: qty 2
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
## 2018-06-30 NOTE — ED Triage Notes (Signed)
Pt states she became dizzy suddenly at 10am.  States she began having nausea and dry heaving since becoming dizzy.  States the room is spinning like she is drunk.  Given Zofran 4mg  IV en route by ems with no relief.

## 2018-06-30 NOTE — ED Notes (Signed)
Dr Alvino Chapel given EKG and notified pt did not pass swallow screen.

## 2018-06-30 NOTE — ED Notes (Signed)
Dr Darrick Meigs called and informed pt failed stroke swallow screen.
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
## 2018-07-01 ENCOUNTER — Other Ambulatory Visit: Payer: Self-pay

## 2018-07-01 ENCOUNTER — Observation Stay (HOSPITAL_COMMUNITY): Payer: Medicare Other

## 2018-07-01 ENCOUNTER — Observation Stay (HOSPITAL_BASED_OUTPATIENT_CLINIC_OR_DEPARTMENT_OTHER): Payer: Medicare Other

## 2018-07-01 DIAGNOSIS — R42 Dizziness and giddiness: Secondary | ICD-10-CM | POA: Diagnosis not present

## 2018-07-01 DIAGNOSIS — G459 Transient cerebral ischemic attack, unspecified: Secondary | ICD-10-CM | POA: Diagnosis not present

## 2018-07-01 DIAGNOSIS — H8112 Benign paroxysmal vertigo, left ear: Secondary | ICD-10-CM | POA: Diagnosis not present

## 2018-07-01 DIAGNOSIS — R112 Nausea with vomiting, unspecified: Secondary | ICD-10-CM | POA: Diagnosis not present

## 2018-07-01 LAB — CBC
HCT: 41.7 % (ref 36.0–46.0)
Hemoglobin: 12.9 g/dL (ref 12.0–15.0)
MCH: 28.1 pg (ref 26.0–34.0)
MCHC: 30.9 g/dL (ref 30.0–36.0)
MCV: 90.8 fL (ref 80.0–100.0)
NRBC: 0 % (ref 0.0–0.2)
PLATELETS: 198 10*3/uL (ref 150–400)
RBC: 4.59 MIL/uL (ref 3.87–5.11)
RDW: 17.5 % — ABNORMAL HIGH (ref 11.5–15.5)
WBC: 5 10*3/uL (ref 4.0–10.5)

## 2018-07-01 LAB — COMPREHENSIVE METABOLIC PANEL
ALBUMIN: 3 g/dL — AB (ref 3.5–5.0)
ALT: 26 U/L (ref 0–44)
AST: 23 U/L (ref 15–41)
Alkaline Phosphatase: 51 U/L (ref 38–126)
Anion gap: 5 (ref 5–15)
BUN: 19 mg/dL (ref 8–23)
CHLORIDE: 103 mmol/L (ref 98–111)
CO2: 29 mmol/L (ref 22–32)
CREATININE: 0.85 mg/dL (ref 0.44–1.00)
Calcium: 8.9 mg/dL (ref 8.9–10.3)
GFR calc Af Amer: >60 mL/min (ref >60)
GFR calc non Af Amer: >60 mL/min (ref >60)
Glucose, Bld: 118 mg/dL — ABNORMAL HIGH (ref 70–99)
Potassium: 3.6 mmol/L (ref 3.5–5.1)
SODIUM: 137 mmol/L (ref 135–145)
Total Bilirubin: 0.6 mg/dL (ref 0.3–1.2)
Total Protein: 5.4 g/dL — ABNORMAL LOW (ref 6.5–8.1)

## 2018-07-01 MED ORDER — LOSARTAN POTASSIUM 50 MG PO TABS
50.0000 mg | ORAL_TABLET | Freq: Every day | ORAL | Status: DC
  Administered 2018-07-01 – 2018-07-04 (×3): 50 mg via ORAL
  Filled 2018-07-01 (×5): qty 1

## 2018-07-01 MED ORDER — ISOSORBIDE MONONITRATE ER 30 MG PO TB24
15.0000 mg | ORAL_TABLET | Freq: Every day | ORAL | Status: DC
  Administered 2018-07-01 – 2018-07-04 (×4): 15 mg via ORAL
  Filled 2018-07-01 (×4): qty 1

## 2018-07-01 MED ORDER — METOPROLOL SUCCINATE ER 25 MG PO TB24
25.0000 mg | ORAL_TABLET | Freq: Every day | ORAL | Status: DC
  Administered 2018-07-01 – 2018-07-04 (×3): 25 mg via ORAL
  Filled 2018-07-01 (×4): qty 1

## 2018-07-01 MED ORDER — NITROGLYCERIN 0.4 MG SL SUBL: 0.4000 mg | SUBLINGUAL_TABLET | SUBLINGUAL | Status: DC | PRN

## 2018-07-01 MED ORDER — LEVOTHYROXINE SODIUM 75 MCG PO TABS
150.0000 ug | ORAL_TABLET | Freq: Every day | ORAL | Status: DC
  Administered 2018-07-01 – 2018-07-05 (×5): 150 ug via ORAL
  Filled 2018-07-01 (×5): qty 2

## 2018-07-01 MED ORDER — BISACODYL 5 MG PO TBEC
5.0000 mg | DELAYED_RELEASE_TABLET | Freq: Every day | ORAL | Status: DC | PRN
  Administered 2018-07-05: 5 mg via ORAL
  Filled 2018-07-01: qty 1

## 2018-07-01 MED ORDER — HYDRALAZINE HCL 20 MG/ML IJ SOLN: 10.0000 mg | INTRAMUSCULAR | Status: DC | PRN

## 2018-07-01 MED ORDER — ENOXAPARIN SODIUM 40 MG/0.4ML ~~LOC~~ SOLN
40.0000 mg | SUBCUTANEOUS | Status: DC
  Administered 2018-07-02 – 2018-07-05 (×3): 40 mg via SUBCUTANEOUS
  Filled 2018-07-01 (×3): qty 0.4

## 2018-07-01 MED ORDER — CYCLOSPORINE 0.05 % OP EMUL
1.0000 [drp] | Freq: Every evening | OPHTHALMIC | Status: DC
  Administered 2018-07-01 – 2018-07-05 (×5): 1 [drp] via OPHTHALMIC
  Filled 2018-07-01 (×7): qty 1

## 2018-07-01 MED ORDER — POLYETHYLENE GLYCOL 3350 17 G PO PACK
17.0000 g | PACK | Freq: Every day | ORAL | Status: DC | PRN
  Administered 2018-07-05: 17 g via ORAL
  Filled 2018-07-01: qty 1

## 2018-07-01 MED ORDER — TRAMADOL HCL 50 MG PO TABS: 50.0000 mg | ORAL_TABLET | Freq: Four times a day (QID) | ORAL | Status: DC | PRN

## 2018-07-01 MED ORDER — PANTOPRAZOLE SODIUM 40 MG PO TBEC
80.0000 mg | DELAYED_RELEASE_TABLET | Freq: Every day | ORAL | Status: DC
  Administered 2018-07-01 – 2018-07-05 (×5): 80 mg via ORAL
  Filled 2018-07-01 (×6): qty 2

## 2018-07-01 MED ORDER — LOTEPREDNOL ETABONATE 0.5 % OP SUSP: 1.0000 [drp] | OPHTHALMIC | Status: DC | PRN

## 2018-07-01 MED ORDER — OMEGA-3-ACID ETHYL ESTERS 1 G PO CAPS
1.0000 g | ORAL_CAPSULE | Freq: Every day | ORAL | Status: DC
  Administered 2018-07-01 – 2018-07-05 (×5): 1 g via ORAL
  Filled 2018-07-01 (×5): qty 1

## 2018-07-01 MED ORDER — ALPRAZOLAM 0.25 MG PO TABS
0.2500 mg | ORAL_TABLET | Freq: Every evening | ORAL | Status: DC | PRN
  Administered 2018-07-01: 0.5 mg via ORAL
  Administered 2018-07-01 – 2018-07-02 (×2): 0.25 mg via ORAL
  Administered 2018-07-03: 0.5 mg via ORAL
  Filled 2018-07-01: qty 2
  Filled 2018-07-01: qty 1
  Filled 2018-07-01 (×2): qty 2
  Filled 2018-07-01: qty 1

## 2018-07-01 MED ORDER — HYDROCHLOROTHIAZIDE 12.5 MG PO CAPS
12.5000 mg | ORAL_CAPSULE | Freq: Every day | ORAL | Status: DC
  Administered 2018-07-01 – 2018-07-04 (×4): 12.5 mg via ORAL
  Filled 2018-07-01 (×4): qty 1

## 2018-07-01 MED ORDER — TRAMADOL HCL 50 MG PO TABS
50.0000 mg | ORAL_TABLET | Freq: Four times a day (QID) | ORAL | Status: DC
  Administered 2018-07-01 – 2018-07-03 (×9): 50 mg via ORAL
  Filled 2018-07-01 (×10): qty 1

## 2018-07-01 MED ORDER — DICLOFENAC SODIUM 75 MG PO TBEC
75.0000 mg | DELAYED_RELEASE_TABLET | Freq: Two times a day (BID) | ORAL | Status: DC | PRN
  Administered 2018-07-01: 75 mg via ORAL
  Filled 2018-07-01 (×2): qty 1

## 2018-07-01 NOTE — Progress Notes (Signed)
Carotid duplex prelim: 1-39% ICA stenosis.  Tanita Palinkas Eunice, RDMS, RVT   

## 2018-07-01 NOTE — Evaluation (Signed)
Physical Therapy Evaluation Patient Details Name: Andrea Jackson MRN: [id number] DOB: 01-27-1934 Today's Date: 07/01/2018   History of Present Illness  Andrea Jackson  is a 84 y.o. female, with history of hypertension, hypothyroidism, CAD, GERD, small bowel ulcers who came to hospital after patient developed dizziness at home. At time of PT eval, MRI negative for CVA.    Clinical Impression  Pt admitted with above diagnosis. Pt currently with functional limitations due to the deficits listed below (see PT Problem List). PTA, pt fully indepednent without AD, driving, living alone with level entrance. Daughter lives around the block. Today, pt presents with acute onset of vertigo that has not relieved yet. She is unable to mobilize safely or comfortablly out of bed this eval. Signs and symptoms are consistent with R unilateral hypofunction, and I am less concerned for BPPV. No central findings on my exam. Pt likely to tolerate more mobility next PT visit at this time unsafe to return home. Discussed with RN.  Pt will benefit from skilled PT to increase their independence and safety with mobility to allow discharge to the venue listed below.       Follow Up Recommendations Home health PT(With safe progression of OOB next visit. HHPT for Vestibular)    07/01/18 0001  Vestibular Assessment  General Observation Pt lying in bed uncomfortable, reports dizziness began yesterday at 10am and has not stopped. reports R sided ear pain, but no hearing loss or ringing, recent head colds. she reports eye motion invokes further dizziness. Room spinning that does not resolve with immobility. Denies oscillopsia, blurred or double vision.    Symptom Behavior  Type of Dizziness Spinning  Frequency of Dizziness first time, all day 10am and on  Duration of Dizziness hours  Aggravating Factors No known aggravating factors  Relieving Factors No known relieving factors  Occulomotor Exam  Occulomotor Alignment Normal   Spontaneous Left beating nystagmus  Gaze-induced Left beating nystagmus with L gaze  Head shaking Horizontal L beating nystagmus  Comment Eye cover test negative, (-) direction changing nystgamus   Positional Testing  Dix-Hallpike  (N/T due to intolerance )  Sidelying Test  (deferred)  Cognition  Cognition Orientation Level Appropriate for developmental age  Positional Sensitivities  Sit to Supine 4  Supine to Left Side 4  Supine to Right Side 4  Supine to Sitting 4       Equipment Recommendations  (TBD)    Recommendations for Other Services       Precautions / Restrictions Precautions Precautions: Fall      Mobility  Bed Mobility Overal bed mobility: Needs Assistance             General bed mobility comments: patient with acute vertigo, falling back with sitting and becoming nauseated. unable to balalnce EOB at this time.   Transfers                 General transfer comment: defered for patient safety/comfort  Ambulation/Gait                Stairs            Wheelchair Mobility    Modified Rankin (Stroke Patients Only)       Balance Overall balance assessment: (cannot tolerate sitting EOB at this eval)  Pertinent Vitals/Pain Pain Assessment: No/denies pain    Home Living Family/patient expects to be discharged to:: Private residence Living Arrangements: Alone Available Help at Discharge: Family(daughter lives a block away) Type of Home: House Home Access: Level entry     Home Layout: One level        Prior Function Level of Independence: Independent         Comments: drives, no AD for mobility     Hand Dominance        Extremity/Trunk Assessment   Upper Extremity Assessment Upper Extremity Assessment: Overall WFL for tasks assessed    Lower Extremity Assessment Lower Extremity Assessment: Overall WFL for tasks assessed        Communication      Cognition Arousal/Alertness: Awake/alert Behavior During Therapy: WFL for tasks assessed/performed                                          General Comments      Exercises     Assessment/Plan    PT Assessment Patient needs continued PT services  PT Problem List Decreased balance       PT Treatment Interventions DME instruction;Gait training;Functional mobility training;Therapeutic activities    PT Goals (Current goals can be found in the Care Plan section)  Acute Rehab PT Goals Patient Stated Goal: stop spinning PT Goal Formulation: With patient Potential to Achieve Goals: Good    Frequency Min 3X/week   Barriers to discharge Decreased caregiver support Lives Alone    Co-evaluation               AM-PAC PT "6 Clicks" Daily Activity  Outcome Measure Difficulty turning over in bed (including adjusting bedclothes, sheets and blankets)?: Unable Difficulty moving from lying on back to sitting on the side of the bed? : Unable Difficulty sitting down on and standing up from a chair with arms (e.g., wheelchair, bedside commode, etc,.)?: Unable Help needed moving to and from a bed to chair (including a wheelchair)?: Total Help needed walking in hospital room?: Total Help needed climbing 3-5 steps with a railing? : Total 6 Click Score: 6    End of Session Equipment Utilized During Treatment: Gait belt Activity Tolerance: Treatment limited secondary to medical complications (Comment) Patient left: in bed;with call bell/phone within reach Nurse Communication: Mobility status PT Visit Diagnosis: Unsteadiness on feet (R26.81);Other symptoms and signs involving the nervous system (R29.898)    Time: 6962-9528 PT Time Calculation (min) (ACUTE ONLY): 26 min   Charges:   PT Evaluation $PT Eval Low Complexity: 1 Low         Reinaldo Berber, PT, DPT Acute Rehabilitation Services Pager: [phone] Office: [phone]    Reinaldo Berber 07/01/2018, 10:34 AM

## 2018-07-01 NOTE — Progress Notes (Addendum)
PROGRESS NOTE    Andrea Jackson  JOA:[id number] DOB: 05-26-1934 DOA: 06/30/2018 PCP: Caryl Bis, MD    Brief Narrative: 84 year old female history of hypertension, hypothyroidism, coronary artery disease, gastroesophageal reflux disease, small bowel ulcer who presented to the hospital after developing dizziness at home.  She suddenly felt that the room was spinning around her at approximately 10 AM yesterday.  She had multiple episodes of associated nausea and vomiting.  She had no associated numbness of the extremities or weakness.  All other symptoms.  She has never had an episode like this before.  Patient stated she stopped taking aspirin once her cardiologist told her she did not need it anymore.  She also had a GI bleed in the past with an unclear source.  She is currently undergoing evaluation for cause of dizziness    Assessment & Plan:   Active Problems:   Dizziness  1. Dizziness-unclear etiology could be right unilateral hypofunction.  Patient had MRI to rule out CVA.    MRI is negative for acute stroke.  She does have some small vessel ischemia. Will start her aspirin 325 mg p.o. daily.  Carotid Dopplers obtained and were 1 to 39% stenosis bilaterally.  Physical therapy evaluation very much appreciated  3. Hypertension-patient is on multiple antihypertensive medications, home medications restarted as no acute CVA.Hypothyroidism-continue Synthroid  4. CAD-stable, continue Imdur   DVT prophylaxis: Lovenox Code Status: DNR  Disposition Plan: Home once symptoms improved   Consultants:   None  Procedures: Carotid Dopplers which were unremarkable  Subjective: No new complaints.  Continue present evaluation  Objective: Vitals:   06/30/18 2245 06/30/18 2351 07/01/18 0352 07/01/18 0820  BP:  (!) 163/69 (!) 152/76 (!) 154/83  Pulse: 62 67 (!) 55 68  Resp: (!) 22 20 18 16   Temp:  98.4 F (36.9 C) 97.9 F (36.6 C) 97.6 F (36.4 C)  TempSrc:  Oral Oral Oral    SpO2: 95% (!) 89% 96% 93%  Weight:      Height:        Intake/Output Summary (Last 24 hours) at 07/01/2018 1347 Last data filed at 07/01/2018 1100 Gross per 24 hour  Intake -  Output 1200 ml  Net -1200 ml   Filed Weights   06/30/18 1644 06/30/18 1646  Weight: 54 kg 72.6 kg    Examination:  General exam: Appears calm and comfortable  Respiratory system: Clear to auscultation. Respiratory effort normal. Cardiovascular system: S1 & S2 heard, RRR. No JVD, murmurs, rubs, gallops or clicks. No pedal edema. Gastrointestinal system: Abdomen is nondistended, soft and nontender. No organomegaly or masses felt. Normal bowel sounds heard. Central nervous system: Alert and oriented. No focal neurological deficits. Extremities: Symmetric 5 x 5 power. Skin: No rashes, lesions or ulcers Psychiatry: Judgement and insight appear normal. Mood & affect appropriate.     Data Reviewed: I have personally reviewed following labs and imaging studies  CBC: Recent Labs  Lab 06/30/18 1735 07/01/18 0049  WBC 4.2 5.0  HGB 13.2 12.9  HCT 41.8 41.7  MCV 93.3 90.8  PLT 181 601   Basic Metabolic Panel: Recent Labs  Lab 06/30/18 1735 07/01/18 0049  NA 136 137  K 3.7 3.6  CL 102 103  CO2 27 29  GLUCOSE 142* 118*  BUN 26* 19  CREATININE 0.82 0.85  CALCIUM 8.7* 8.9   Liver Function Tests: Recent Labs  Lab 07/01/18 0049  AST 23  ALT 26  ALKPHOS 51  BILITOT 0.6  PROT  5.4*  ALBUMIN 3.0*     Radiology Studies: Ct Head Wo Contrast  Result Date: 06/30/2018 CLINICAL DATA:  Dizziness and 10 a.m. EXAM: CT HEAD WITHOUT CONTRAST TECHNIQUE: Contiguous axial images were obtained from the base of the skull through the vertex without intravenous contrast. COMPARISON:  11/30/2014 head CT FINDINGS: Brain: Chronic stable involutional changes of the brain. Chronic minimal small vessel ischemic disease of periventricular white matter. No hydrocephalus. No intra-axial mass, hemorrhage, midline shift  or edema. No large vascular territory infarct. Midline fourth ventricle and basal cisterns. The brainstem and cerebellum appear intact. Vascular: Atherosclerosis of the left vertebral both cavernous carotid arteries. No hyperdense vessel sign. Unexpected calcifications. Skull: Normal. Negative for fracture or focal lesion. Sinuses/Orbits: No acute finding. Other: None. IMPRESSION: Atrophy with chronic minimal small vessel ischemic disease. No acute intracranial abnormality is noted. Electronically Signed   By: Ashley Royalty M.D.   On: 06/30/2018 18:27   Mr Brain Wo Contrast  Result Date: 07/01/2018 CLINICAL DATA:  Vertigo with nausea and vomiting. EXAM: MRI HEAD WITHOUT CONTRAST TECHNIQUE: Multiplanar, multiecho pulse sequences of the brain and surrounding structures were obtained without intravenous contrast. COMPARISON:  Head CT 06/30/2017 FINDINGS: BRAIN: There is no acute infarct, acute hemorrhage, hydrocephalus or extra-axial collection. The midline structures are normal. No midline shift or other mass effect. Multifocal white matter hyperintensity, most commonly due to chronic ischemic microangiopathy. The cerebral and cerebellar volume are age-appropriate. Susceptibility-sensitive sequences show no chronic microhemorrhage or superficial siderosis. VASCULAR: Major intracranial arterial and venous sinus flow voids are normal. SKULL AND UPPER CERVICAL SPINE: Calvarial bone marrow signal is normal. There is no skull base mass. Visualized upper cervical spine and soft tissues are normal. SINUSES/ORBITS: No fluid levels or advanced mucosal thickening. No mastoid or middle ear effusion. The orbits are normal. IMPRESSION: Chronic small vessel ischemia without acute intracranial abnormality. Electronically Signed   By: Ulyses Jarred M.D.   On: 07/01/2018 05:06   Dg Chest Portable 1 View  Result Date: 06/30/2018 CLINICAL DATA:  Hypoxia EXAM: PORTABLE CHEST 1 VIEW COMPARISON:  12/17/2017 chest radiograph.  FINDINGS: Stable cardiomediastinal silhouette with normal heart size. No pneumothorax. No pleural effusion. Lungs appear clear, with no acute consolidative airspace disease and no pulmonary edema. IMPRESSION: No active disease. Electronically Signed   By: Ilona Sorrel M.D.   On: 06/30/2018 20:41        Scheduled Meds: . cycloSPORINE  1 drop Both Eyes QPM  . enoxaparin (LOVENOX) injection  40 mg Subcutaneous Q24H  . hydrochlorothiazide  12.5 mg Oral Daily  . isosorbide mononitrate  15 mg Oral Daily  . levothyroxine  150 mcg Oral QAC breakfast  . losartan  50 mg Oral Daily  . metoprolol succinate  25 mg Oral Daily  . omega-3 acid ethyl esters  1 g Oral Daily  . pantoprazole  80 mg Oral Q1200  . traMADol  50 mg Oral Q6H     LOS: 0 days    Time spent:  45 minutes   Lady Deutscher, MD [facility] Pager [phone]  If 7PM-7AM, please contact night-coverage [email or web address] Password [email or web address] 07/01/2018, 1:47 PM
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
## 2018-07-02 DIAGNOSIS — H8112 Benign paroxysmal vertigo, left ear: Secondary | ICD-10-CM | POA: Diagnosis present

## 2018-07-02 MED ORDER — MECLIZINE HCL 12.5 MG PO TABS
12.5000 mg | ORAL_TABLET | Freq: Three times a day (TID) | ORAL | Status: DC
  Administered 2018-07-02 – 2018-07-05 (×9): 12.5 mg via ORAL
  Filled 2018-07-02 (×14): qty 1

## 2018-07-02 MED ORDER — ONDANSETRON 4 MG PO TBDP
4.0000 mg | ORAL_TABLET | Freq: Four times a day (QID) | ORAL | Status: DC | PRN
  Administered 2018-07-02 – 2018-07-04 (×2): 4 mg via ORAL
  Filled 2018-07-02 (×3): qty 1

## 2018-07-02 NOTE — Progress Notes (Signed)
Pt c/o headache that she noted after PT.

## 2018-07-02 NOTE — Progress Notes (Addendum)
Pt oob to bathroom with assist x 2. Pt attemtps to eat some of supper but c/o nausea

## 2018-07-02 NOTE — Progress Notes (Signed)
Pt continues resting quietly but c/o increased dizziness whenmoving in bed.

## 2018-07-02 NOTE — Progress Notes (Signed)
PT at bedside.

## 2018-07-02 NOTE — Progress Notes (Signed)
Physical Therapy Treatment Patient Details Name: Andrea Jackson MRN: [id number] DOB: Sep 06, 1934 Today's Date: 07/02/2018    History of Present Illness Andrea Jackson  is a 84 y.o. female, with history of hypertension, hypothyroidism, CAD, GERD, small bowel ulcers who came to hospital after patient developed dizziness at home. At time of PT eval, MRI negative for CVA.    PT Comments    Pt admitted with above diagnosis. Pt currently with functional limitations due to the deficits listed below (see PT Problem List).  Checked pt again and symptoms of nystagmus noting left rotary nystagmus but less intense than this am.  Performed Semont maneuver x 2.  Then Canalith repositioning maneuver for left posterior canal BPPV.  Pt with less intensity of symptoms this pm.  Pt was nauseous after and asked the nurse for nausea med.  Also asked MD if Antivert may be helpful short term.  Daughter present and now states that husband really cannot help pt and that pt needs to go to SNF as she works.  Updated MD and SW.  Will follow acutely.  Pt will benefit from skilled PT to increase their independence and safety with mobility to allow discharge to the venue listed below.     Follow Up Recommendations  Supervision/Assistance - 24 hour;SNF(Vestibular rehab)     Equipment Recommendations  3in1 (PT)    Recommendations for Other Services       Precautions / Restrictions Precautions Precautions: Fall Restrictions Weight Bearing Restrictions: No    Mobility  Bed Mobility Overal bed mobility: Needs Assistance Bed Mobility: Supine to Sit     Supine to sit: Min assist;+2 for safety/equipment;Min guard     General bed mobility comments: Pt wtih symptoms of dizziness throughout eval.  Feel that pt has cupulolithiaiss and this is why her symptoms are more constant.    Transfers Overall transfer level: Needs assistance Equipment used: Rolling walker (2 wheeled) Transfers: Sit to/from Stand Sit to Stand: +2  physical assistance;Min assist         General transfer comment: Pt needed mmin assist to steady herself.  +2 HHA bilaterally.  Posterior lean.  LEss assist needed today.    Ambulation/Gait                 Stairs             Wheelchair Mobility    Modified Rankin (Stroke Patients Only)       Balance Overall balance assessment: Needs assistance Sitting-balance support: Bilateral upper extremity supported;Feet supported Sitting balance-Leahy Scale: Poor Sitting balance - Comments: relies on UE or external support Postural control: Posterior lean Standing balance support: Bilateral upper extremity supported;During functional activity Standing balance-Leahy Scale: Poor Standing balance comment: relies on external support                            Cognition Arousal/Alertness: Awake/alert Behavior During Therapy: WFL for tasks assessed/performed Overall Cognitive Status: Within Functional Limits for tasks assessed                                        Exercises      General Comments General comments (skin integrity, edema, etc.): Pt with less intensity of nystagmus this pm.  Reperformed Semont x 2 to try and free crystals from cupula.  then repeated left canalith repositioning. Pt left sitting up  in bed with daughter present.  MD to give pt some Antivert to help control dizzy symptoms.        Pertinent Vitals/Pain Pain Assessment: No/denies pain    Home Living                      Prior Function            PT Goals (current goals can now be found in the care plan section) Progress towards PT goals: Progressing toward goals    Frequency    Min 3X/week      PT Plan Discharge plan needs to be updated    Co-evaluation              AM-PAC PT "6 Clicks" Daily Activity  Outcome Measure  Difficulty turning over in bed (including adjusting bedclothes, sheets and blankets)?: Unable Difficulty moving from  lying on back to sitting on the side of the bed? : Unable Difficulty sitting down on and standing up from a chair with arms (e.g., wheelchair, bedside commode, etc,.)?: Unable Help needed moving to and from a bed to chair (including a wheelchair)?: A Lot Help needed walking in hospital room?: A Lot Help needed climbing 3-5 steps with a railing? : Total 6 Click Score: 8    End of Session Equipment Utilized During Treatment: Gait belt Activity Tolerance: Patient limited by fatigue(limited by dizziness) Patient left: with call bell/phone within reach;in chair;with bed alarm set;with family/visitor present Nurse Communication: Mobility status PT Visit Diagnosis: Unsteadiness on feet (R26.81);Other symptoms and signs involving the nervous system (R29.898);BPPV;Dizziness and giddiness (R42)     Time: 9357-0177 PT Time Calculation (min) (ACUTE ONLY): 54 min  Charges:  $Gait Training: 8-22 mins $Therapeutic Activity: 23-37 mins $Self Care/Home Management: 8-22                     [place] Pager:  [phone]  Office:  [place] 07/02/2018, 4:59 PM

## 2018-07-02 NOTE — Progress Notes (Signed)
   07/02/18 1000  PT Visit Information  Last PT Received On 07/02/18   PT - Assessment/Plan  Follow Up Recommendations Home health PT;Supervision/Assistance - 24 hour (Vestibular rehab)  Spoke with daughter via phone about recommendation for 24 hour care.  She states that her mother could stay with her and her husband as he can provide 24 hour care.  Will need vestibular rehab.  Full note to follow.   [place] Pager:  [phone]  Office:  [phone]

## 2018-07-02 NOTE — Progress Notes (Signed)
  Speech Language Pathology Treatment: Dysphagia  Patient Details Name: Andrea Jackson MRN: [id number] DOB: [birth year] Today's Date: 07/02/2018 Time: 1030-1040 SLP Time Calculation (min) (ACUTE ONLY): 10 min  Assessment / Plan / Recommendation Clinical Impression  Patient has a long history of esophageal dysphagia with 2 prior dilatations. She began a regular diet with thin liquids with compensatory strategy of alternating liquids and solids, avoiding fibrous meets and bread. She reports to be tolerating meals with no esophageal (globus feeling), eating the amount she would normally eat. All goals of tolerating regular diet with thin liquids.D Speech therapy is signing off.   HPI HPI:  Andrea Jackson  is a 84 y.o. female, with history of hypertension, hypothyroidism, CAD, GERD, small bowel ulcers who came to hospital after patient developed dizziness at home.  Patient says the symptoms started around 10 AM when she suddenly began to feel that room was spinning around.  She also had multiple episodes of nausea and vomiting.  Denies weakness or numbness of extremities.  Denies slurred speech or blurred vision.  Denies ear problems.  No previous episode like that before. MRI is negative for acute stroke.      SLP Plan   Goals met and speech therapy signing off.        Recommendations  Diet recommendations: Regular;Thin liquid Liquids provided via: Cup;Straw Medication Administration: Whole meds with liquid Supervision: Patient able to self feed Compensations: Follow solids with liquid Postural Changes and/or Swallow Maneuvers: Seated upright 90 degrees                Oral Care Recommendations: Oral care BID Follow up Recommendations: None SLP Visit Diagnosis: Dysphagia, unspecified (R13.10)       [place], MA, CCC-SLP 07/02/2018 11:38 AM

## 2018-07-02 NOTE — Progress Notes (Addendum)
Physical Therapy Treatment Patient Details Name: Andrea Jackson MRN: [id number] DOB: [birth year] Today's Date: 07/02/2018    History of Present Illness Andrea Jackson  is a 84 y.o. female, with history of hypertension, hypothyroidism, CAD, GERD, small bowel ulcers who came to hospital after patient developed dizziness at home. At time of PT eval, MRI negative for CVA.    PT Comments    Pt admitted with above diagnosis. Pt currently with functional limitations due to the deficits listed below (see PT Problem List). Pt was able to ambulate but with +2 assist due to dizziness.  Needs continued vestibular treatment and daughter aware that pt needing +2 assist currently.  Daughter prefers mom go home with her and her husband and get HHPT. Aware that pt may need to use wheelchair intiially.  Will follow acutely.   Pt will benefit from skilled PT to increase their independence and safety with mobility to allow discharge to the venue listed below.     Follow Up Recommendations  Home health PT;Supervision/Assistance - 24 hour(Vestibular rehab)     Equipment Recommendations  3in1 (PT)    Recommendations for Other Services       Precautions / Restrictions Precautions Precautions: Fall Restrictions Weight Bearing Restrictions: No    Mobility  Bed Mobility Overal bed mobility: Needs Assistance Bed Mobility: Supine to Sit     Supine to sit: Min assist;+2 for safety/equipment     General bed mobility comments: Pt wtih symptoms of dizziness throughout eval.  Feel that pt has cupulolithiaiss and this is why her symptoms are more constant.    Transfers Overall transfer level: Needs assistance Equipment used: Rolling walker (2 wheeled) Transfers: Sit to/from Stand Sit to Stand: Mod assist;+2 physical assistance         General transfer comment: Pt needed mod assist to steady herfself.  +2 HHA bilaterally.  Posterior lean  Ambulation/Gait Ambulation/Gait assistance: Mod assist;+2 physical  assistance Gait Distance (Feet): 15 Feet Assistive device: 2 person hand held assist Gait Pattern/deviations: Decreased step length - right;Decreased step length - left;Decreased stride length;Step-to pattern;Shuffle;Narrow base of support;Leaning posteriorly;Staggering left;Staggering right   Gait velocity interpretation: <1.31 ft/sec, indicative of household ambulator General Gait Details: Pt needed constant support due to continued dizzines.  Walked to bathroom and then to chair.     Stairs             Wheelchair Mobility    Modified Rankin (Stroke Patients Only)       Balance Overall balance assessment: Needs assistance Sitting-balance support: Bilateral upper extremity supported;Feet supported Sitting balance-Leahy Scale: Poor Sitting balance - Comments: reliec on UE or external support Postural control: Posterior lean Standing balance support: Bilateral upper extremity supported;During functional activity Standing balance-Leahy Scale: Poor Standing balance comment: relies on external support                            Cognition Arousal/Alertness: Awake/alert Behavior During Therapy: WFL for tasks assessed/performed Overall Cognitive Status: Within Functional Limits for tasks assessed                                        Exercises      General Comments General comments (skin integrity, edema, etc.): Tested positive for left BPPV.  Performed Semont maneuver due to suspicion of cupulolithiasis.  Then Treated with canalith repositioning for left posterior canal  BPPV.        Pertinent Vitals/Pain Pain Assessment: No/denies pain    Home Living                      Prior Function            PT Goals (current goals can now be found in the care plan section) Acute Rehab PT Goals Patient Stated Goal: stop spinning Progress towards PT goals: Progressing toward goals    Frequency    Min 3X/week      PT Plan  Current plan remains appropriate    Co-evaluation              AM-PAC PT "6 Clicks" Daily Activity  Outcome Measure  Difficulty turning over in bed (including adjusting bedclothes, sheets and blankets)?: Unable Difficulty moving from lying on back to sitting on the side of the bed? : Unable Difficulty sitting down on and standing up from a chair with arms (e.g., wheelchair, bedside commode, etc,.)?: Unable Help needed moving to and from a bed to chair (including a wheelchair)?: A Lot Help needed walking in hospital room?: A Lot Help needed climbing 3-5 steps with a railing? : Total 6 Click Score: 8    End of Session Equipment Utilized During Treatment: Gait belt Activity Tolerance: Patient limited by fatigue(limited by dizziness) Patient left: with call bell/phone within reach;in chair Nurse Communication: Mobility status PT Visit Diagnosis: Unsteadiness on feet (R26.81);Other symptoms and signs involving the nervous system (R29.898);BPPV;Dizziness and giddiness (R42)     Time: 3818-2993 PT Time Calculation (min) (ACUTE ONLY): 54 min  Charges:  $Gait Training: 8-22 mins $Therapeutic Activity: 23-37 mins $Canalith Rep Proc: 8-22 mins                     [place] Pager:  [phone]  Office:  [place] 07/02/2018, 12:40 PM

## 2018-07-02 NOTE — Progress Notes (Signed)
Pt sleeping but rousees to voice. Denies pain at present.

## 2018-07-02 NOTE — Plan of Care (Signed)
Pt verbalized understanding plan of care. Pt symptoms not resolving. Pt still complains of dizziness. Not able to get up and ambulate safely due to dizziness. Pt denies any nausea or vomiting.

## 2018-07-02 NOTE — Progress Notes (Signed)
Family present for physical therapy treatment.  They now feel they cannot care for patient at home and are requesting skilled nursing facility placement.  Discharge has been discontinued.

## 2018-07-02 NOTE — Discharge Summary (Addendum)
Physician progress note  Andrea Jackson:[id number] DOB: 09-May-1934 DOA: 06/30/2018  PCP: Caryl Bis, MD  Admit date: 06/30/2018 Discharge date: 07/02/2018  Admitted From: Home Disposition: Home  Recommendations for Outpatient Follow-up:  1. Follow up with Dr. Gar Ponto in the next 1 to 2 days 2. Follow-up with Dr. Johnny Bridge from cardiology in the next 2 weeks 3. Please obtain BMP/CBC in one week 4. Home health will see you for vestibular rehabilitation   Home Health: Yes Equipment/Devices: Bedside commode  Discharge Condition: Improved CODE STATUS: do Not resuscitate Diet recommendation: Heart Healthy   Brief/Interim Summary: 84 year old female history of hypertension, hypothyroidism, coronary artery disease, gastroesophageal reflux disease, small bowel ulcer who presented to the hospital after developing dizziness at home.  She suddenly felt that the room was spinning around her at approximately 10 AM yesterday.  She had multiple episodes of associated nausea and vomiting.  She had no associated numbness of the extremities or weakness.  All other symptoms.  She has never had an episode like this before.  Patient stated she stopped taking aspirin once her cardiologist told her she did not need it anymore.  She also had a GI bleed in the past with an unclear source.  She is currently undergoing evaluation for cause of dizziness physical therapy found that she had left-sided benign vertigo.  Physical therapy thought the patient would benefit from inpatient skilled nursing facility however family has adequate support at home to take care of the patient at home and that is their plan. Initially family was interested in discharge home but once they saw the patient working with physical therapy felt they could not take her home and have requested skilled nursing facility placement.  Patient Active Problem List   Diagnosis Date Noted  . BPPV (benign paroxysmal positional vertigo),  left 07/02/2018  . Dizziness 06/30/2018  . Right lower quadrant abdominal mass 03/02/2018  . Aortic dissection ([place]) 03/02/2018  . Leukopenia 12/18/2017  . GERD (gastroesophageal reflux disease) 02/15/2017  . Pancreatic lesion 02/15/2017  . Esophageal web 02/01/2017  . Anorexia   . Dysphagia 01/30/2017  . Mucosal abnormality of stomach   . Diverticulosis of colon without hemorrhage   . Heme positive stool 07/27/2015  . Esophageal dysphagia 07/27/2015  . Near syncope 03/02/2015  . IDA (iron deficiency anemia) 03/03/2014  . Chronic generalized abdominal pain 11/15/2012  . Chronic constipation 11/15/2012  . Long term current use of non-steroidal anti-inflammatories (NSAID) 01/07/2011  . Hemorrhage of gastrointestinal tract 01/28/2010  . Essential hypertension, benign 09/18/2009  . Hypothyroidism 07/31/2009  . Iron deficiency anemia 07/29/2009  . Mixed hyperlipidemia 10/21/2008  . CORONARY ATHEROSCLEROSIS NATIVE CORONARY ARTERY 10/21/2008   1.  BPPV: Just spoke with physical therapy who feels that patient's not improving with Epley maneuvers will start some meclizine and hydrate the patient.  Patient will need to stay overnight.  Other problems as listed above.  Discharge Instructions  Discharge Instructions    Call MD for:  persistant dizziness or light-headedness   Complete by:  As directed    Diet - low sodium heart healthy   Complete by:  As directed    Increase activity slowly   Complete by:  As directed      Allergies as of 07/02/2018      Reactions   Demerol [meperidine]    Patient stated she had a "reaction" to Demerol. Swollen lip.   Celecoxib    REACTION: hyper, couldn't eat or sleep   Codeine  REACTION: itching   Oxycodone-acetaminophen Itching   Statins Other (See Comments)   Muscle aches, can not tolerate any of them per patient.        Medication List    TAKE these medications   ALPRAZolam 0.5 MG tablet Commonly known as:  XANAX Take 0.5-1 tablets  by mouth at bedtime as needed for sleep or anxiety (May take one-half tablet during the day if needed). For anxiety   bisacodyl 5 MG EC tablet Commonly known as:  DULCOLAX Take 5 mg by mouth daily as needed for moderate constipation.   diclofenac 75 MG EC tablet Commonly known as:  VOLTAREN Take 1 tablet by mouth. Takes 1-2 times per day   diclofenac sodium 1 % Gel Commonly known as:  VOLTAREN diclofenac 1 % topical gel  APPLY 2-4 GRAMS FOUR TIMES DAILY AS NEEDED FOR INFLAMMATION   esomeprazole 40 MG capsule Commonly known as:  NEXIUM Take 1 tablet by mouth as needed (acid reflux).   Fish Oil 1000 MG Caps Take 1 capsule by mouth daily.   hydrochlorothiazide 12.5 MG capsule Commonly known as:  MICROZIDE Take 12.5 mg by mouth daily.   isosorbide mononitrate 30 MG 24 hr tablet Commonly known as:  IMDUR TAKE 1/2 TABLET BY MOUTH DAILY   levothyroxine 150 MCG tablet Commonly known as:  SYNTHROID, LEVOTHROID Take 150 mcg by mouth daily before breakfast.   losartan 100 MG tablet Commonly known as:  COZAAR Take 50 mg by mouth daily. for high blood pressure   LOTEMAX 0.5 % ophthalmic suspension Generic drug:  loteprednol as needed.   metoprolol succinate 25 MG 24 hr tablet Commonly known as:  TOPROL-XL Take 1 tablet by mouth daily.   nitroGLYCERIN 0.4 MG SL tablet Commonly known as:  NITROSTAT Place 0.4 mg under the tongue as needed for chest pain.   polyethylene glycol packet Commonly known as:  MIRALAX / GLYCOLAX Take 17 g by mouth daily as needed for mild constipation.   promethazine 25 MG tablet Commonly known as:  PHENERGAN Take 25 mg by mouth every 6 (six) hours as needed for nausea or vomiting.   RESTASIS 0.05 % ophthalmic emulsion Generic drug:  cycloSPORINE Place 1 drop into both eyes every evening.   traMADol 50 MG tablet Commonly known as:  ULTRAM Take 50 mg by mouth every 6 (six) hours as needed. For pain   vitamin B-12 250 MCG tablet Commonly  known as:  CYANOCOBALAMIN Take 250 mcg by mouth as needed.            Durable Medical Equipment  (From admission, onward)         Start     Ordered   07/02/18 1321  For home use only DME 3 n 1  Once     07/02/18 1321         Follow-up Information    Caryl Bis, MD. Call in 2 day(s).   Specialty:  Family Medicine Contact information: [place] [address] [phone]        Satira Sark, MD Follow up in 2 week(s).   Specialty:  Cardiology Contact information: [place] [address] [place] Follow up.   Why:  [facility]side [facility] Contact information: [address] [place] [address] [phone]        [facility] Follow up.   Specialty:  Home Health  Services Why:  Home Health Physical Therapy for vestibular rehab Contact information: [place] [address] [phone]          Allergies  Allergen Reactions  . Demerol [Meperidine]     Patient stated she had a "reaction" to Demerol. Swollen lip.  . Celecoxib     REACTION: hyper, couldn't eat or sleep  . Codeine     REACTION: itching  . Oxycodone-Acetaminophen Itching  . Statins Other (See Comments)    Muscle aches, can not tolerate any of them per patient.        Procedures/Studies: Ct Head Wo Contrast  Result Date: 06/30/2018 CLINICAL DATA:  Dizziness and 10 a.m. EXAM: CT HEAD WITHOUT CONTRAST TECHNIQUE: Contiguous axial images were obtained from the base of the skull through the vertex without intravenous contrast. COMPARISON:  11/30/2014 head CT FINDINGS: Brain: Chronic stable involutional changes of the brain. Chronic minimal small vessel ischemic disease of periventricular white matter. No hydrocephalus. No intra-axial mass, hemorrhage, midline shift or edema. No large vascular territory infarct. Midline fourth ventricle and basal cisterns. The brainstem and  cerebellum appear intact. Vascular: Atherosclerosis of the left vertebral both cavernous carotid arteries. No hyperdense vessel sign. Unexpected calcifications. Skull: Normal. Negative for fracture or focal lesion. Sinuses/Orbits: No acute finding. Other: None. IMPRESSION: Atrophy with chronic minimal small vessel ischemic disease. No acute intracranial abnormality is noted. Electronically Signed   By: Ashley Royalty M.D.   On: 06/30/2018 18:27   Mr Brain Wo Contrast  Result Date: 07/01/2018 CLINICAL DATA:  Vertigo with nausea and vomiting. EXAM: MRI HEAD WITHOUT CONTRAST TECHNIQUE: Multiplanar, multiecho pulse sequences of the brain and surrounding structures were obtained without intravenous contrast. COMPARISON:  Head CT 06/30/2017 FINDINGS: BRAIN: There is no acute infarct, acute hemorrhage, hydrocephalus or extra-axial collection. The midline structures are normal. No midline shift or other mass effect. Multifocal white matter hyperintensity, most commonly due to chronic ischemic microangiopathy. The cerebral and cerebellar volume are age-appropriate. Susceptibility-sensitive sequences show no chronic microhemorrhage or superficial siderosis. VASCULAR: Major intracranial arterial and venous sinus flow voids are normal. SKULL AND UPPER CERVICAL SPINE: Calvarial bone marrow signal is normal. There is no skull base mass. Visualized upper cervical spine and soft tissues are normal. SINUSES/ORBITS: No fluid levels or advanced mucosal thickening. No mastoid or middle ear effusion. The orbits are normal. IMPRESSION: Chronic small vessel ischemia without acute intracranial abnormality. Electronically Signed   By: Ulyses Jarred M.D.   On: 07/01/2018 05:06   Dg Chest Portable 1 View  Result Date: 06/30/2018 CLINICAL DATA:  Hypoxia EXAM: PORTABLE CHEST 1 VIEW COMPARISON:  12/17/2017 chest radiograph. FINDINGS: Stable cardiomediastinal silhouette with normal heart size. No pneumothorax. No pleural effusion. Lungs  appear clear, with no acute consolidative airspace disease and no pulmonary edema. IMPRESSION: No active disease. Electronically Signed   By: Ilona Sorrel M.D.   On: 06/30/2018 20:41    Bilateral carotid Dopplers revealed: Right Carotid: Velocities in the right ICA are consistent with a 1-39% stenosis.  Left Carotid: Velocities in the left ICA are consistent with a 1-39% stenosis. Subjective: Patient with no new complaints.  She is starting to improve slowly with physical therapy.  Discharge Exam: Vitals:   07/02/18 0615 07/02/18 1121  BP: (!) 146/73 137/73  Pulse: 60 (!) 53  Resp: 17 16  Temp: 97.8 F (36.6 C) 98 F (36.7 C)  SpO2: 92% 98%   Vitals:   07/01/18 2146 07/02/18 0417 07/02/18 0615 07/02/18 1121  BP:  133/66  (!) 146/73 137/73  Pulse: (!) 51  60 (!) 53  Resp: 16 16 17 16   Temp: 97.7 F (36.5 C)  97.8 F (36.6 C) 98 F (36.7 C)  TempSrc: Oral  Oral Oral  SpO2: 97%  92% 98%  Weight:      Height:        General: Pt is alert, awake, not in acute distress Cardiovascular: RRR, S1/S2 +, no rubs, no gallops Respiratory: CTA bilaterally, no wheezing, no rhonchi Abdominal: Soft, NT, ND, bowel sounds + Extremities: no edema, no cyanosis    The results of significant diagnostics from this hospitalization (including imaging, microbiology, ancillary and laboratory) are listed below for reference.     Microbiology: No results found for this or any previous visit (from the past 240 hour(s)).   Labs: BNP (last 3 results) Recent Labs    12/17/17 1615  BNP 414.2*   Basic Metabolic Panel: Recent Labs  Lab 06/30/18 1735 07/01/18 0049  NA 136 137  K 3.7 3.6  CL 102 103  CO2 27 29  GLUCOSE 142* 118*  BUN 26* 19  CREATININE 0.82 0.85  CALCIUM 8.7* 8.9   Liver Function Tests: Recent Labs  Lab 07/01/18 0049  AST 23  ALT 26  ALKPHOS 51  BILITOT 0.6  PROT 5.4*  ALBUMIN 3.0*   No results for input(s): LIPASE, AMYLASE in the last 168 hours. No results  for input(s): AMMONIA in the last 168 hours. CBC: Recent Labs  Lab 06/30/18 1735 07/01/18 0049  WBC 4.2 5.0  HGB 13.2 12.9  HCT 41.8 41.7  MCV 93.3 90.8  PLT 181 198    Urinalysis    Component Value Date/Time   COLORURINE YELLOW 12/17/2017 1824   APPEARANCEUR CLEAR 12/17/2017 1824   LABSPEC 1.012 12/17/2017 1824   PHURINE 6.0 12/17/2017 1824   GLUCOSEU NEGATIVE 12/17/2017 1824   HGBUR NEGATIVE 12/17/2017 1824   BILIRUBINUR NEGATIVE 12/17/2017 1824   KETONESUR NEGATIVE 12/17/2017 1824   PROTEINUR NEGATIVE 12/17/2017 1824   UROBILINOGEN 0.2 01/30/2013 1325   NITRITE NEGATIVE 12/17/2017 1824   LEUKOCYTESUR NEGATIVE 12/17/2017 1824    Time coordinating discharge: 42 minutes  SIGNED:   Lady Deutscher, MD  FACP [facility] 07/02/2018, 4:25 PM Pager   If 7PM-7AM, please contact night-coverage [email or web address] Password [email or web address]

## 2018-07-02 NOTE — Progress Notes (Signed)
Family at bedside and states they are unable to provide care for pt. Dr. Evangeline Gula aware.

## 2018-07-02 NOTE — Care Management Note (Addendum)
Case Management Note  Patient Details  Name: Andrea Jackson MRN: [id number] Date of Birth: 02/14/1934  Subjective/Objective:  84 yo female presented from home with dizziness. PMH: hypertension, hypothyroidism, CAD, GERD, small bowel ulcers.                Action/Plan: CM met with patient to discuss transitional needs. Patient lives at home alone, independent with ADL, reports not using any DME, but has a cane, RW and hospital bed available. Patient confirmed PCP as: Dr. Terry Daniel; pharmacy of choice: [facility], [place]. CM discussed HHPT (vestibular) with assist vs SNF, with patient declining ST SNF, and opting to transition home with HHPT. CM discussed 24hr assistance, with patient indicating she could stay with her daughter, but her daughter works during the day and her son-in-law recently had a knee replacement. CM stressed the importance of transitioning home safely, and the need for assistance per PT recommendations d/t patient's continual c/o dizziness; CM discussed concerns with Dr. Sheehan. HH preference list provided, with patient requesting HH with [facility]. CM will continue to follow prior to arranging HH service for possible ST SNF placement d/t safety.   Expected Discharge Date:  07/02/18               Expected Discharge Plan:  Home w Home Health Services  In-House Referral:  NA  Discharge planning Services  CM Consult  Post Acute Care Choice:  Home Health, Durable Medical Equipment Choice offered to:  Patient  DME Arranged:  Bedside commode DME Agency:  [facility]  HH Arranged:  PT, RN, Disease Management HH Agency:  [facility]  Status of Service:  Completed, signed off  If discussed at Long Length of Stay Meetings, dates discussed:    Additional Comments: 07/02/18 @ 1623-  RNCM-Family are now unable to provide care post transition and are now requesting ST SNF. SW consult for placement. CM will continue to follow.   07/02/18 @  1313-  RNCM-Patient will transition home with HH services, with assistance provided by her daughter. PT recommended a BSC with DME preference given; patient selected [facility]. HH referral given to Donna, [facility] liaison,and DME referral given to James, [facility] liaison, with AVS updated. Patient's family will provide transportation home.     RN, BSN, NCM-BC, ACM-RN [phone] 07/02/2018, 10:28 AM  

## 2018-07-03 DIAGNOSIS — H8112 Benign paroxysmal vertigo, left ear: Secondary | ICD-10-CM | POA: Diagnosis not present

## 2018-07-03 NOTE — Progress Notes (Addendum)
Physical Therapy Treatment Patient Details Name: Andrea Jackson MRN: [id number] DOB: 05/26/1934 Today's Date: 07/03/2018    History of Present Illness Andrea Jackson  is a 84 y.o. female, with history of hypertension, hypothyroidism, CAD, GERD, small bowel ulcers who came to hospital after patient developed dizziness at home. At time of PT eval, MRI negative for CVA.    PT Comments    Pt admitted with above diagnosis. Pt currently with functional limitations due to balance and endurance deficits. Pt was able to ambulate into hallway with cues and min to mod assist of one person with RW.  Pt at times very off balance unless using compensation.  Pt has become steadier each day. Suspect that canals are clear and that residual dizziness is from right hypofunction.   Pt left in chair and initiated x 1 exercises with pt.  Pt instructed to perform multiple times today.  Pt agrees.   Pt will benefit from skilled PT to increase their independence and safety with mobility to allow discharge to the venue listed below.     Follow Up Recommendations  Supervision/Assistance - 24 hour;SNF(Vestibular rehab)     Equipment Recommendations  3in1 (PT)    Recommendations for Other Services       Precautions / Restrictions Precautions Precautions: Fall Restrictions Weight Bearing Restrictions: No    Mobility  Bed Mobility Overal bed mobility: Needs Assistance Bed Mobility: Supine to Sit     Supine to sit: Min assist;+2 for safety/equipment;Min guard     General bed mobility comments: Much easier transition.   Transfers Overall transfer level: Needs assistance Equipment used: Rolling walker (2 wheeled) Transfers: Sit to/from Stand Sit to Stand: +2 physical assistance;Min assist         General transfer comment: Pt needed min assist to steady herself to RW. Less Posterior lean.  LEss assist needed today.  Washed pt as purewick had leaked and pt wet.  Changed all pt  linens.    Ambulation/Gait Ambulation/Gait assistance: +2 physical assistance;Min assist to mod asssit  Gait Distance (Feet): 80 Feet Assistive device: Rolling walker (2 wheeled) Gait Pattern/deviations: Decreased step length - right;Decreased step length - left;Decreased stride length;Step-to pattern;Shuffle;Narrow base of support;Leaning posteriorly;Staggering left;Staggering right   Gait velocity interpretation: <1.31 ft/sec, indicative of household ambulator General Gait Details: Pt needed constant support.  As long as pt focused on object, she was steadier but pt would lose balance when not focusing on object.  Pt walked in hallway with RW today with constant cues for gaze stability.  Then walked to bathroom and then to chair.     Stairs             Wheelchair Mobility    Modified Rankin (Stroke Patients Only)       Balance Overall balance assessment: Needs assistance Sitting-balance support: Bilateral upper extremity supported;Feet supported Sitting balance-Leahy Scale: Fair Sitting balance - Comments: Pt was able to sit without external support but still needs at least 1 UE.  Postural control: Posterior lean Standing balance support: Bilateral upper extremity supported;During functional activity Standing balance-Leahy Scale: Poor Standing balance comment: relies on external support                            Cognition Arousal/Alertness: Awake/alert Behavior During Therapy: WFL for tasks assessed/performed Overall Cognitive Status: Within Functional Limits for tasks assessed  Exercises Other Exercises Other Exercises: x 1 exercises initiated with pt able to perform x 2 about 20 seconds each time.  Pt aware to perform 2-3 x hour.      General Comments General comments (skin integrity, edema, etc.): Determined to perform BBQ roll for left BPPV horizontal canal to ensure that no crystals were stuck there.         Pertinent Vitals/Pain Pain Assessment: No/denies pain    Home Living                      Prior Function            PT Goals (current goals can now be found in the care plan section) Acute Rehab PT Goals Patient Stated Goal: stop spinning Time For Goal Achievement: 07/15/18 Progress towards PT goals: Progressing toward goals    Frequency    Min 3X/week      PT Plan Current plan remains appropriate    Co-evaluation              AM-PAC PT "6 Clicks" Daily Activity  Outcome Measure  Difficulty turning over in bed (including adjusting bedclothes, sheets and blankets)?: Unable Difficulty moving from lying on back to sitting on the side of the bed? : Unable Difficulty sitting down on and standing up from a chair with arms (e.g., wheelchair, bedside commode, etc,.)?: Unable Help needed moving to and from a bed to chair (including a wheelchair)?: A Lot Help needed walking in hospital room?: A Lot Help needed climbing 3-5 steps with a railing? : Total 6 Click Score: 8    End of Session Equipment Utilized During Treatment: Gait belt Activity Tolerance: Patient limited by fatigue(limited by dizziness) Patient left: with call bell/phone within reach;in chair;with chair alarm set Nurse Communication: Mobility status PT Visit Diagnosis: Unsteadiness on feet (R26.81);Other symptoms and signs involving the nervous system (R29.898);BPPV;Dizziness and giddiness (R42)     Time: 2297-9892 PT Time Calculation (min) (ACUTE ONLY): 43 min  Charges:  $Gait Training: 8-22 mins $Therapeutic Exercise: 8-22 mins $Canalith Rep Proc: 8-22 mins                     [place] Pager:  [phone]  Office:  [place] 07/03/2018, 12:20 PM

## 2018-07-03 NOTE — Clinical Social Work Note (Signed)
Clinical Social Work Assessment  Patient Details  Name: Andrea Jackson MRN: [id number] Date of Birth: [birth year]  Date of referral:  07/03/18               Reason for consult:  Facility Placement, Discharge Planning                Permission sought to share information with:  Facility Art therapist granted to share information::     Name::        Agency::  SNF's  Relationship::     Contact Information:     Housing/Transportation Living arrangements for the past 2 months:  Single Family Home Source of Information:  Patient, Medical Team Patient Interpreter Needed:  None Criminal Activity/Legal Involvement Pertinent to Current Situation/Hospitalization:  No - Comment as needed Significant Relationships:  Adult Children, Other Family Members Lives with:  Self Do you feel safe going back to the place where you live?  Yes Need for family participation in patient care:  Yes (Comment)  Care giving concerns:  PT recommending SNF once medically stable for discharge.   Social Worker assessment / plan:  CSW met with patient. No supports at bedside. CSW introduced role and explained that PT recommendations would be discussed. Patient unsure if she wants SNF or HHPT. She stated her granddaughter will be here shortly so she will discuss it with her and then call CSW with update. Patient asked about CIR but CSW explained that PT was not recommending that level of care and she would have to be able to manage 3-4 hours of therapy per day. No further concerns. CSW encouraged patient to contact CSW as needed. CSW will continue to follow patient for support and facilitate discharge to SNF, if agreeable, once medically stable.  Employment status:  Retired Nurse, adult PT Recommendations:  [place] / Referral to community resources:  [place]  Patient/Family's Response to care:  Patient unsure if she wants SNF or  not. Patient's family supportive and involved in patient's care. Patient appreciated social work intervention.  Patient/Family's Understanding of and Emotional Response to Diagnosis, Current Treatment, and Prognosis:  Patient has a good understanding of the reason for admission and her need for therapy after discharge. Patient appears happy with hospital care.  Emotional Assessment Appearance:  Appears stated age Attitude/Demeanor/Rapport:  Engaged, Gracious Affect (typically observed):  Appropriate, Calm, Pleasant Orientation:  Oriented to Self, Oriented to Place, Oriented to  Time, Oriented to Situation Alcohol / Substance use:  Never Used Psych involvement (Current and /or in the community):  No (Comment)  Discharge Needs  Concerns to be addressed:  Care Coordination Readmission within the last 30 days:  No Current discharge risk:  Dependent with Mobility, Lives alone Barriers to Discharge:  [place], LCSW 07/03/2018, 12:34 PM

## 2018-07-03 NOTE — [place] FL2 (Signed)
[place] LEVEL OF CARE SCREENING TOOL     IDENTIFICATION  Patient Name: Andrea Jackson Birthdate: [birth year] Sex: female Admission Date (Current Location): 06/30/2018  [facility] and [place] Number:  [employer] and Address:  [facility] [place] [address], [place], [place] [address]      Provider Number: [id number]  Attending Physician Name and Address:  Lady Deutscher, MD  Relative Name and Phone Number:       Current Level of Care: Hospital Recommended Level of Care: [place] Prior Approval Number:    Date Approved/Denied:   PASRR Number: [id number]  Discharge Plan: SNF    Current Diagnoses: Patient Active Problem List   Diagnosis Date Noted  . BPPV (benign paroxysmal positional vertigo), left 07/02/2018  . Dizziness 06/30/2018  . Right lower quadrant abdominal mass 03/02/2018  . Aortic dissection ([place]) 03/02/2018  . Leukopenia 12/18/2017  . GERD (gastroesophageal reflux disease) 02/15/2017  . Pancreatic lesion 02/15/2017  . Esophageal web 02/01/2017  . Anorexia   . Dysphagia 01/30/2017  . Mucosal abnormality of stomach   . Diverticulosis of colon without hemorrhage   . Heme positive stool 07/27/2015  . Esophageal dysphagia 07/27/2015  . Near syncope 03/02/2015  . IDA (iron deficiency anemia) 03/03/2014  . Chronic generalized abdominal pain 11/15/2012  . Chronic constipation 11/15/2012  . Long term current use of non-steroidal anti-inflammatories (NSAID) 01/07/2011  . Hemorrhage of gastrointestinal tract 01/28/2010  . Essential hypertension, benign 09/18/2009  . Hypothyroidism 07/31/2009  . Iron deficiency anemia 07/29/2009  . Mixed hyperlipidemia 10/21/2008  . CORONARY ATHEROSCLEROSIS NATIVE CORONARY ARTERY 10/21/2008    Orientation RESPIRATION BLADDER Height & Weight     Self, Situation, Time, Place  Normal Continent Weight: 160 lb (72.6 kg) Height:  5\' 1"  (154.9 cm)  BEHAVIORAL  SYMPTOMS/MOOD NEUROLOGICAL BOWEL NUTRITION STATUS  (None) (None) Continent Diet(Regular)  AMBULATORY STATUS COMMUNICATION OF NEEDS Skin   Limited Assist Verbally Other (Comment)(Skin tear.)                       Personal Care Assistance Level of Assistance              Functional Limitations Info  Sight, Hearing, Speech Sight Info: Adequate Hearing Info: Adequate Speech Info: Adequate    SPECIAL CARE FACTORS FREQUENCY  PT (By licensed PT)     PT Frequency: 5 x week              Contractures Contractures Info: Not present    Additional Factors Info  Code Status, Allergies Code Status Info: DNR Allergies Info: Demerol (Meperidine), Celecoxib, Codeine, Oxycodone-acetaminophen, Statins           Current Medications (07/03/2018):  This is the current hospital active medication list Current Facility-Administered Medications  Medication Dose Route Frequency Provider Last Rate Last Dose  . ALPRAZolam Duanne Moron) tablet 0.25-0.5 mg  0.25-0.5 mg Oral QHS PRN Oswald Hillock, MD   0.25 mg at 07/02/18 2253  . bisacodyl (DULCOLAX) EC tablet 5 mg  5 mg Oral Daily PRN Lady Deutscher, MD      . cycloSPORINE (RESTASIS) 0.05 % ophthalmic emulsion 1 drop  1 drop Both Eyes QPM Lady Deutscher, MD   1 drop at 07/02/18 1905  . diclofenac (VOLTAREN) EC tablet 75 mg  75 mg Oral BID BM & HS PRN Lady Deutscher, MD   75 mg at 07/01/18 1111  . enoxaparin (LOVENOX) injection  40 mg  40 mg Subcutaneous Q24H Oswald Hillock, MD   40 mg at 07/02/18 2243  . hydrALAZINE (APRESOLINE) injection 10 mg  10 mg Intravenous Q4H PRN Oswald Hillock, MD      . hydrochlorothiazide (MICROZIDE) capsule 12.5 mg  12.5 mg Oral Daily Lady Deutscher, MD   12.5 mg at 07/03/18 1142  . isosorbide mononitrate (IMDUR) 24 hr tablet 15 mg  15 mg Oral Daily Oswald Hillock, MD   15 mg at 07/03/18 1143  . levothyroxine (SYNTHROID, LEVOTHROID) tablet 150 mcg  150 mcg Oral QAC breakfast Oswald Hillock, MD   150 mcg at  07/03/18 0739  . losartan (COZAAR) tablet 50 mg  50 mg Oral Daily Lady Deutscher, MD   Stopped at 07/03/18 1145  . loteprednol (LOTEMAX) 0.5 % ophthalmic suspension 1 drop  1 drop Both Eyes PRN Lady Deutscher, MD      . meclizine (ANTIVERT) tablet 12.5 mg  12.5 mg Oral TID Lady Deutscher, MD   12.5 mg at 07/03/18 1144  . metoprolol succinate (TOPROL-XL) 24 hr tablet 25 mg  25 mg Oral Daily Lady Deutscher, MD   25 mg at 07/03/18 1144  . nitroGLYCERIN (NITROSTAT) SL tablet 0.4 mg  0.4 mg Sublingual PRN Lady Deutscher, MD      . omega-3 acid ethyl esters (LOVAZA) capsule 1 g  1 g Oral Daily Lady Deutscher, MD   1 g at 07/03/18 1142  . ondansetron (ZOFRAN-ODT) disintegrating tablet 4 mg  4 mg Oral Q6H PRN Lady Deutscher, MD   4 mg at 07/02/18 1847  . pantoprazole (PROTONIX) EC tablet 80 mg  80 mg Oral Q1200 Lady Deutscher, MD   80 mg at 07/02/18 1422  . polyethylene glycol (MIRALAX / GLYCOLAX) packet 17 g  17 g Oral Daily PRN Lady Deutscher, MD      . traMADol Veatrice Bourbon) tablet 50 mg  50 mg Oral Q6H Fransico Meadow, PA-C   50 mg at 07/03/18 0600  . traMADol (ULTRAM) tablet 50 mg  50 mg Oral Q6H PRN Lady Deutscher, MD         Discharge Medications: Please see discharge summary for a list of discharge medications.  Relevant Imaging Results:  Relevant Lab Results:   Additional Information SS#: [id number]  Candie Chroman, LCSW

## 2018-07-03 NOTE — Clinical Social Work Placement (Signed)
   CLINICAL SOCIAL WORK PLACEMENT  NOTE  Date:  07/03/2018  Patient Details  Name: Andrea Jackson MRN: [id number] Date of Birth: [birth year]  Clinical Social Work is seeking post-discharge placement for this patient at the [place] level of care (*CSW will initial, date and re-position this form in  chart as items are completed):  Yes   Patient/family provided with [place] Work Department's list of facilities offering this level of care within the geographic area requested by the patient (or if unable, by the patient's family).  Yes   Patient/family informed of their freedom to choose among providers that offer the needed level of care, that participate in Medicare, Medicaid or managed care program needed by the patient, have an available bed and are willing to accept the patient.  Yes   Patient/family informed of [place]'s ownership interest in [facility] and [facility], as well as of the fact that they are under no obligation to receive care at these facilities.  PASRR submitted to EDS on 07/03/18     PASRR number received on 07/03/18     Existing PASRR number confirmed on       FL2 transmitted to all facilities in geographic area requested by pt/family on 07/03/18     FL2 transmitted to all facilities within larger geographic area on       Patient informed that his/her managed care company has contracts with or will negotiate with certain facilities, including the following:            Patient/family informed of bed offers received.  Patient chooses bed at       Physician recommends and patient chooses bed at      Patient to be transferred to   on  .  Patient to be transferred to facility by       Patient family notified on   of transfer.  Name of family member notified:        PHYSICIAN Please sign FL2     Additional Comment:    _______________________________________________ Candie Chroman, LCSW 07/03/2018,  12:37 PM

## 2018-07-03 NOTE — Clinical Social Work Note (Signed)
Patient has chosen [facility] CSW notified admissions coordinator and they will start insurance authorization. Patient requesting PTAR once discharged.  [place] Scrape, [place]

## 2018-07-03 NOTE — Progress Notes (Signed)
PROGRESS NOTE    Andrea Jackson  FBP:[id number] DOB: [birth year] DOA: 06/30/2018 PCP: Caryl Bis, MD   Brief Narrative:   84 year old female history of hypertension, hypothyroidism, coronary artery disease, gastroesophageal reflux disease, small bowel ulcer who presented to the hospital after developing dizziness at home. She suddenly felt that the room was spinning around her at approximately 10 AM yesterday. She had multiple episodes of associated nausea and vomiting. She had no associated numbness of the extremities or weakness. All other symptoms. She has never had an episode like this before. Patient stated she stopped taking aspirin once her cardiologist told her she did not need it anymore. She also had a GI bleed in the past with an unclear source. She is currently undergoing evaluation for cause of dizziness physical therapy found that she had left-sided benign vertigo.  Physical therapy thought the patient would benefit from inpatient skilled nursing facility however family initially felt adequate support at home to take care of the patient at home and that was their plan.  Once they saw the patient working with physical therapy felt they could not take her home and have requested skilled nursing facility placement.  Assessment & Plan:   Principal Problem:   BPPV (benign paroxysmal positional vertigo), left Active Problems:   Dizziness   1.  BPPV: Meclizine begun yesterday.  Patient to have more physical therapy today. 2.  Dizziness: Patient states she is less dizzy today but not 100%. 3.  Position: Family interested in skilled nursing facility.   DVT prophylaxis: Lovenox Code Status: DNR Family Communication: Spoke with daughter yesterday who is interested in skilled facility Disposition Plan: Skilled nursing facility     Subjective: Patient sitting in bed does not feel that she is back to normal yet.  Awaiting physical therapy  reevaluation.  Objective: Vitals:   07/02/18 1903 07/03/18 0211 07/03/18 0558 07/03/18 1130  BP: (!) 140/95 110/73 122/63 117/76  Pulse: 76 64 (!) 58 63  Resp: 18 16  16   Temp: 98.4 F (36.9 C) 98.4 F (36.9 C) 98.4 F (36.9 C) 97.6 F (36.4 C)  TempSrc: Oral Oral Oral Oral  SpO2: 97% 93% 91% 91%  Weight:      Height:        Intake/Output Summary (Last 24 hours) at 07/03/2018 1213 Last data filed at 07/03/2018 0300 Gross per 24 hour  Intake 0 ml  Output -  Net 0 ml   Filed Weights   06/30/18 1644 06/30/18 1646  Weight: 54 kg 72.6 kg    Examination:  General exam: Appears calm and comfortable  Respiratory system: Clear to auscultation. Respiratory effort normal. Cardiovascular system: S1 & S2 heard, RRR. No JVD, murmurs, rubs, gallops or clicks. No pedal edema. Gastrointestinal system: Abdomen is nondistended, soft and nontender. No organomegaly or masses felt. Normal bowel sounds heard. Central nervous system: Alert and oriented. No focal neurological deficits. Extremities: Symmetric 5 x 5 power. Skin: No rashes, lesions or ulcers Psychiatry: Judgement and insight appear normal. Mood & affect appropriate.     Data Reviewed: I have personally reviewed following labs and imaging studies  CBC: Recent Labs  Lab 06/30/18 1735 07/01/18 0049  WBC 4.2 5.0  HGB 13.2 12.9  HCT 41.8 41.7  MCV 93.3 90.8  PLT 181 824   Basic Metabolic Panel: Recent Labs  Lab 06/30/18 1735 07/01/18 0049  NA 136 137  K 3.7 3.6  CL 102 103  CO2 27 29  GLUCOSE 142* 118*  BUN  26* 19  CREATININE 0.82 0.85  CALCIUM 8.7* 8.9   GFR: Estimated Creatinine Clearance: 46.5 mL/min (by C-G formula based on SCr of 0.85 mg/dL). Liver Function Tests: Recent Labs  Lab 07/01/18 0049  AST 23  ALT 26  ALKPHOS 51  BILITOT 0.6  PROT 5.4*  ALBUMIN 3.0*     Radiology Studies: No results found.      Scheduled Meds: . cycloSPORINE  1 drop Both Eyes QPM  . enoxaparin (LOVENOX)  injection  40 mg Subcutaneous Q24H  . hydrochlorothiazide  12.5 mg Oral Daily  . isosorbide mononitrate  15 mg Oral Daily  . levothyroxine  150 mcg Oral QAC breakfast  . losartan  50 mg Oral Daily  . meclizine  12.5 mg Oral TID  . metoprolol succinate  25 mg Oral Daily  . omega-3 acid ethyl esters  1 g Oral Daily  . pantoprazole  80 mg Oral Q1200  . traMADol  50 mg Oral Q6H   Continuous Infusions:   LOS: 0 days    Time spent: 45 minutes    Lady Deutscher, MD FACP [facility] Pager [phone]  If 7PM-7AM, please contact night-coverage [email or web address] Password [facility] 07/03/2018, 12:13 PM

## 2018-07-04 DIAGNOSIS — R42 Dizziness and giddiness: Secondary | ICD-10-CM

## 2018-07-04 DIAGNOSIS — I10 Essential (primary) hypertension: Secondary | ICD-10-CM | POA: Diagnosis not present

## 2018-07-04 DIAGNOSIS — H8112 Benign paroxysmal vertigo, left ear: Secondary | ICD-10-CM

## 2018-07-04 MED ORDER — MECLIZINE HCL 12.5 MG PO TABS: 12.5000 mg | ORAL_TABLET | Freq: Three times a day (TID) | ORAL | 0 refills | Status: DC | PRN

## 2018-07-04 MED ORDER — CARBAMIDE PEROXIDE 6.5 % OT SOLN: 5.0000 [drp] | Freq: Two times a day (BID) | OTIC | Status: DC

## 2018-07-04 MED ORDER — TRAMADOL HCL 50 MG PO TABS: 50.0000 mg | ORAL_TABLET | Freq: Four times a day (QID) | ORAL | 0 refills | Status: DC | PRN

## 2018-07-04 NOTE — Discharge Summary (Addendum)
Physician Discharge Summary  Andrea Jackson RNH:[id number] DOB: [birth year] DOA: 06/30/2018  PCP: Caryl Bis, MD  Admit date: 06/30/2018 Discharge date: 07/04/2018  Admitted From: home Discharge disposition: SNF   Recommendations for Outpatient Follow-Up:   1. Vestibular PT   Discharge Diagnosis:   Principal Problem:   BPPV (benign paroxysmal positional vertigo), left Active Problems:   Dizziness    Discharge Condition: Improved.  Diet recommendation: Low sodium, heart healthy.  Wound care: None.  Code status: DNR   History of Present Illness:   84 year old female history of hypertension, hypothyroidism, coronary artery disease, gastroesophageal reflux disease, small bowel ulcer who presented to the hospital after developing dizziness at home. She suddenly felt that the room was spinning around her at approximately 10 AM yesterday. She had multiple episodes of associated nausea and vomiting. She had no associated numbness of the extremities or weakness. All other symptoms. She has never had an episode like this before. Patient stated she stopped taking aspirin once her cardiologist told her she did not need it anymore. She also had a GI bleed in the past with an unclear source. She is currently undergoing evaluation for cause of dizzinessphysical therapy found that she had left-sided benign vertigo. Physical therapy thought the patient would benefit from inpatient skilled nursing facility however family initially felt adequate support at home to take care of the patient at home and that was theirplan.  Once they saw the patient working with physical therapy felt they could not take her home and have requested skilled nursing facility placement.   Hospital Course by Problem:   BPPV: Meclizine begun yesterday.  MRI negative for CVA -seen by PT, patient lives alone-- needs SNF for short term vestibular rehab -carotids normal  Hypothyroid -continue  synthroid  CAD -continue imdur  HTN -improved  Medical Consultants:      Discharge Exam:   Vitals:   07/04/18 0604 07/04/18 1011  BP: 132/76 135/70  Pulse: 63 60  Resp: 17 16  Temp: 98 F (36.7 C)   SpO2: 94% 95%   Vitals:   07/03/18 1850 07/04/18 0011 07/04/18 0604 07/04/18 1011  BP: (!) 159/108 (!) 156/80 132/76 135/70  Pulse: 73 61 63 60  Resp:   17 16  Temp: 98.6 F (37 C) 98 F (36.7 C) 98 F (36.7 C)   TempSrc: Oral Oral Oral   SpO2: 97% 93% 94% 95%  Weight:      Height:        General exam: Appears calm and comfortable.     The results of significant diagnostics from this hospitalization (including imaging, microbiology, ancillary and laboratory) are listed below for reference.     Procedures and Diagnostic Studies:   Ct Head Wo Contrast  Result Date: 06/30/2018 CLINICAL DATA:  Dizziness and 10 a.m. EXAM: CT HEAD WITHOUT CONTRAST TECHNIQUE: Contiguous axial images were obtained from the base of the skull through the vertex without intravenous contrast. COMPARISON:  11/30/2014 head CT FINDINGS: Brain: Chronic stable involutional changes of the brain. Chronic minimal small vessel ischemic disease of periventricular white matter. No hydrocephalus. No intra-axial mass, hemorrhage, midline shift or edema. No large vascular territory infarct. Midline fourth ventricle and basal cisterns. The brainstem and cerebellum appear intact. Vascular: Atherosclerosis of the left vertebral both cavernous carotid arteries. No hyperdense vessel sign. Unexpected calcifications. Skull: Normal. Negative for fracture or focal lesion. Sinuses/Orbits: No acute finding. Other: None. IMPRESSION: Atrophy with chronic minimal small vessel ischemic disease. No  acute intracranial abnormality is noted. Electronically Signed   By: Ashley Royalty M.D.   On: 06/30/2018 18:27   Mr Brain Wo Contrast  Result Date: 07/01/2018 CLINICAL DATA:  Vertigo with nausea and vomiting. EXAM: MRI HEAD WITHOUT  CONTRAST TECHNIQUE: Multiplanar, multiecho pulse sequences of the brain and surrounding structures were obtained without intravenous contrast. COMPARISON:  Head CT 06/30/2017 FINDINGS: BRAIN: There is no acute infarct, acute hemorrhage, hydrocephalus or extra-axial collection. The midline structures are normal. No midline shift or other mass effect. Multifocal white matter hyperintensity, most commonly due to chronic ischemic microangiopathy. The cerebral and cerebellar volume are age-appropriate. Susceptibility-sensitive sequences show no chronic microhemorrhage or superficial siderosis. VASCULAR: Major intracranial arterial and venous sinus flow voids are normal. SKULL AND UPPER CERVICAL SPINE: Calvarial bone marrow signal is normal. There is no skull base mass. Visualized upper cervical spine and soft tissues are normal. SINUSES/ORBITS: No fluid levels or advanced mucosal thickening. No mastoid or middle ear effusion. The orbits are normal. IMPRESSION: Chronic small vessel ischemia without acute intracranial abnormality. Electronically Signed   By: Ulyses Jarred M.D.   On: 07/01/2018 05:06   Dg Chest Portable 1 View  Result Date: 06/30/2018 CLINICAL DATA:  Hypoxia EXAM: PORTABLE CHEST 1 VIEW COMPARISON:  12/17/2017 chest radiograph. FINDINGS: Stable cardiomediastinal silhouette with normal heart size. No pneumothorax. No pleural effusion. Lungs appear clear, with no acute consolidative airspace disease and no pulmonary edema. IMPRESSION: No active disease. Electronically Signed   By: Ilona Sorrel M.D.   On: 06/30/2018 20:41     Labs:   Basic Metabolic Panel: Recent Labs  Lab 06/30/18 1735 07/01/18 0049  NA 136 137  K 3.7 3.6  CL 102 103  CO2 27 29  GLUCOSE 142* 118*  BUN 26* 19  CREATININE 0.82 0.85  CALCIUM 8.7* 8.9   GFR Estimated Creatinine Clearance: 46.5 mL/min (by C-G formula based on SCr of 0.85 mg/dL). Liver Function Tests: Recent Labs  Lab 07/01/18 0049  AST 23  ALT 26    ALKPHOS 51  BILITOT 0.6  PROT 5.4*  ALBUMIN 3.0*   No results for input(s): LIPASE, AMYLASE in the last 168 hours. No results for input(s): AMMONIA in the last 168 hours. Coagulation profile No results for input(s): INR, PROTIME in the last 168 hours.  CBC: Recent Labs  Lab 06/30/18 1735 07/01/18 0049  WBC 4.2 5.0  HGB 13.2 12.9  HCT 41.8 41.7  MCV 93.3 90.8  PLT 181 198   Cardiac Enzymes: No results for input(s): CKTOTAL, CKMB, CKMBINDEX, TROPONINI in the last 168 hours. BNP: Invalid input(s): POCBNP CBG: No results for input(s): GLUCAP in the last 168 hours. D-Dimer No results for input(s): DDIMER in the last 72 hours. Hgb A1c No results for input(s): HGBA1C in the last 72 hours. Lipid Profile No results for input(s): CHOL, HDL, LDLCALC, TRIG, CHOLHDL, LDLDIRECT in the last 72 hours. Thyroid function studies No results for input(s): TSH, T4TOTAL, T3FREE, THYROIDAB in the last 72 hours.  Invalid input(s): FREET3 Anemia work up No results for input(s): VITAMINB12, FOLATE, FERRITIN, TIBC, IRON, RETICCTPCT in the last 72 hours. Microbiology No results found for this or any previous visit (from the past 240 hour(s)).   Discharge Instructions:   Discharge Instructions    Diet - low sodium heart healthy   Complete by:  As directed    Increase activity slowly   Complete by:  As directed      Allergies as of 07/04/2018  Reactions   Demerol [meperidine]    Patient stated she had a "reaction" to Demerol. Swollen lip.   Celecoxib    REACTION: hyper, couldn't eat or sleep   Codeine    REACTION: itching   Oxycodone-acetaminophen Itching   Statins Other (See Comments)   Muscle aches, can not tolerate any of them per patient.        Medication List    STOP taking these medications   hydrochlorothiazide 12.5 MG capsule Commonly known as:  MICROZIDE     TAKE these medications   ALPRAZolam 0.5 MG tablet Commonly known as:  XANAX Take 0.5-1 tablets by  mouth at bedtime as needed for sleep or anxiety (May take one-half tablet during the day if needed). For anxiety   bisacodyl 5 MG EC tablet Commonly known as:  DULCOLAX Take 5 mg by mouth daily as needed for moderate constipation.   diclofenac 75 MG EC tablet Commonly known as:  VOLTAREN Take 1 tablet by mouth. Takes 1-2 times per day   diclofenac sodium 1 % Gel Commonly known as:  VOLTAREN diclofenac 1 % topical gel  APPLY 2-4 GRAMS FOUR TIMES DAILY AS NEEDED FOR INFLAMMATION   esomeprazole 40 MG capsule Commonly known as:  NEXIUM Take 1 tablet by mouth as needed (acid reflux).   Fish Oil 1000 MG Caps Take 1 capsule by mouth daily.   isosorbide mononitrate 30 MG 24 hr tablet Commonly known as:  IMDUR TAKE 1/2 TABLET BY MOUTH DAILY   levothyroxine 150 MCG tablet Commonly known as:  SYNTHROID, LEVOTHROID Take 150 mcg by mouth daily before breakfast.   losartan 100 MG tablet Commonly known as:  COZAAR Take 50 mg by mouth daily. for high blood pressure   LOTEMAX 0.5 % ophthalmic suspension Generic drug:  loteprednol as needed.   meclizine 12.5 MG tablet Commonly known as:  ANTIVERT Take 1 tablet (12.5 mg total) by mouth 3 (three) times daily as needed for dizziness.   metoprolol succinate 25 MG 24 hr tablet Commonly known as:  TOPROL-XL Take 1 tablet by mouth daily.   nitroGLYCERIN 0.4 MG SL tablet Commonly known as:  NITROSTAT Place 0.4 mg under the tongue as needed for chest pain.   polyethylene glycol packet Commonly known as:  MIRALAX / GLYCOLAX Take 17 g by mouth daily as needed for mild constipation.   promethazine 25 MG tablet Commonly known as:  PHENERGAN Take 25 mg by mouth every 6 (six) hours as needed for nausea or vomiting.   RESTASIS 0.05 % ophthalmic emulsion Generic drug:  cycloSPORINE Place 1 drop into both eyes every evening.   traMADol 50 MG tablet Commonly known as:  ULTRAM Take 1 tablet (50 mg total) by mouth every 6 (six) hours as  needed for severe pain. What changed:    reasons to take this  additional instructions   vitamin B-12 250 MCG tablet Commonly known as:  CYANOCOBALAMIN Take 250 mcg by mouth as needed.            Durable Medical Equipment  (From admission, onward)         Start     Ordered   07/02/18 1321  For home use only DME 3 n 1  Once     07/02/18 1321          Contact information for follow-up providers    Caryl Bis, MD. Call in 2 day(s).   Specialty:  Family Medicine Contact information: [place]  [place]        Satira Sark, MD Follow up in 2 week(s).   Specialty:  Cardiology Contact information: [place] [address] [place] Follow up.   Why:  [facility] Contact information: [address] [place] [address] [phone]        [facility] Follow up.   Specialty:  Home Health Services Why:  Home Health Physical Therapy for vestibular rehab Contact information: [address] [place] [address] [phone]            Contact information for after-discharge care    Destination    [facility] .   Service:  Skilled Nursing Contact information: [address] [place] [phone]                   Time coordinating discharge: 25 min  Signed:  Geradine Girt  [facility] 07/04/2018, 2:36 PM

## 2018-07-04 NOTE — Progress Notes (Signed)
Physical Therapy Treatment Patient Details Name: Andrea Jackson MRN: [id number] DOB: [birth year] Today's Date: 07/04/2018    History of Present Illness Andrea Jackson  is a 84 y.o. female, with history of hypertension, hypothyroidism, CAD, GERD, small bowel ulcers who came to hospital after patient developed dizziness at home. At time of PT eval, MRI negative for CVA.    PT Comments    Pt admitted with above diagnosis. Pt currently with functional limitations due to balance and endurance deficits. Pt progressing but still has LOB with challenges to balance.  Will continue to follow acutely.  Pt will benefit from skilled PT to increase their independence and safety with mobility to allow discharge to the venue listed below.     Follow Up Recommendations  Supervision/Assistance - 24 hour;SNF(Vestibular rehab)     Equipment Recommendations  3in1 (PT)    Recommendations for Other Services       Precautions / Restrictions Precautions Precautions: Fall Restrictions Weight Bearing Restrictions: No    Mobility  Bed Mobility Overal bed mobility: Needs Assistance Bed Mobility: Supine to Sit     Supine to sit: Min guard     General bed mobility comments: Much easier transition.   Transfers Overall transfer level: Needs assistance Equipment used: Rolling walker (2 wheeled) Transfers: Sit to/from Stand Sit to Stand: Min guard         General transfer comment: Pt needed min guard assist to RW but forgot hand placement and needed cues for this.   Ambulation/Gait Ambulation/Gait assistance: Min assist;Mod assist;+2 safety/equipment Gait Distance (Feet): 180 Feet Assistive device: Rolling walker (2 wheeled) Gait Pattern/deviations: Decreased step length - right;Decreased step length - left;Decreased stride length;Step-to pattern;Shuffle;Narrow base of support;Staggering left;Staggering right   Gait velocity interpretation: <1.31 ft/sec, indicative of household ambulator General  Gait Details: Pt needed intermittent support based on challenges given.   Pt walked in hallway with RW today and given challenges to balance in which some challenges she was able to maintain balance and some required mod assist to steady pt.  Pt consistently losing balance to her right.    Stairs             Wheelchair Mobility    Modified Rankin (Stroke Patients Only)       Balance Overall balance assessment: Needs assistance Sitting-balance support: Bilateral upper extremity supported;Feet supported Sitting balance-Leahy Scale: Fair Sitting balance - Comments: Pt was able to sit without external support but still needs at least 1 UE.  Postural control: Posterior lean Standing balance support: Bilateral upper extremity supported;During functional activity Standing balance-Leahy Scale: Poor Standing balance comment: relies on external support                 Standardized Balance Assessment Standardized Balance Assessment : Dynamic Gait Index   Dynamic Gait Index Level Surface: Mild Impairment Change in Gait Speed: Mild Impairment Gait with Horizontal Head Turns: Moderate Impairment Gait with Vertical Head Turns: Mild Impairment Gait and Pivot Turn: Mild Impairment Step Over Obstacle: Mild Impairment Step Around Obstacles: Normal Steps: Mild Impairment Total Score: 16      Cognition Arousal/Alertness: Awake/alert Behavior During Therapy: WFL for tasks assessed/performed Overall Cognitive Status: Within Functional Limits for tasks assessed                                        Exercises Other Exercises Other Exercises: x 1 exercises initiated with  pt able to perform x 2 about 35 seconds each time.  Pt aware to perform 2-3 x hour.      General Comments General comments (skin integrity, edema, etc.): Pt scored 16/24 on DGI suggesting risk of falls.  Pt aware that she scored poorly.       Pertinent Vitals/Pain Pain Assessment: No/denies  pain    Home Living                      Prior Function            PT Goals (current goals can now be found in the care plan section) Acute Rehab PT Goals Patient Stated Goal: stop spinning Progress towards PT goals: Progressing toward goals    Frequency    Min 3X/week      PT Plan Current plan remains appropriate    Co-evaluation              AM-PAC PT "6 Clicks" Daily Activity  Outcome Measure  Difficulty turning over in bed (including adjusting bedclothes, sheets and blankets)?: Unable Difficulty moving from lying on back to sitting on the side of the bed? : Unable Difficulty sitting down on and standing up from a chair with arms (e.g., wheelchair, bedside commode, etc,.)?: Unable Help needed moving to and from a bed to chair (including a wheelchair)?: A Lot Help needed walking in hospital room?: A Lot Help needed climbing 3-5 steps with a railing? : Total 6 Click Score: 8    End of Session Equipment Utilized During Treatment: Gait belt Activity Tolerance: Patient limited by fatigue(limited by dizziness) Patient left: with call bell/phone within reach;in chair;with chair alarm set Nurse Communication: Mobility status PT Visit Diagnosis: Unsteadiness on feet (R26.81);Other symptoms and signs involving the nervous system (R29.898);BPPV;Dizziness and giddiness (R42)     Time: 7342-8768 PT Time Calculation (min) (ACUTE ONLY): 26 min  Charges:  $Gait Training: 8-22 mins $Therapeutic Exercise: 8-22 mins                     Ava Pager:  [phone]  Office:  [place] 07/04/2018, 2:13 PM

## 2018-07-04 NOTE — Progress Notes (Signed)
Iv site noted to be red ; Dr.Vann aware , ok to d/c IV and do not need to reinsert another IV ; IV removed at this time , site cleaned with peroxide and placed clean gauze to site ; will continue to monitor ; warm compress also applied

## 2018-07-04 NOTE — Progress Notes (Signed)
Continue to await SNF authorization.  D/C IV.  Warm compresses to site- monitor for infection. Eulogio Bear DO

## 2018-07-04 NOTE — Clinical Social Work Note (Addendum)
Insurance authorization still pending. Admissions coordinator is hopeful we will have it late this afternoon.  Dayton Scrape, [place] [phone]  4:07 pm Insurance authorization still pending. SNF admissions coordinator said admissions will definitely have to wait until tomorrow even if we get approval today because they have missed their window to get medications delivered. They have to know the patient is definitely coming before they can order medications.  Dayton Scrape, [place]

## 2018-07-04 NOTE — Progress Notes (Signed)
Pt alert and oriented x4. Pt still remains unsteady with her gait. Poor safety awareness. Pt re educated on the need to be placed in SNF. Pt believes she can be independent a home. Pt remains "picking" at R arm skin tear and previous IV site located near skin tear, consistent with previous encounters. Pt re-educated on the need to not touch area with unclean hands to prevent infection.  Warm compress on site, area to be lightly covered to prevent patient from "picking" at site.

## 2018-07-05 DIAGNOSIS — Z79899 Other long term (current) drug therapy: Secondary | ICD-10-CM | POA: Diagnosis not present

## 2018-07-05 DIAGNOSIS — I10 Essential (primary) hypertension: Secondary | ICD-10-CM | POA: Diagnosis not present

## 2018-07-05 DIAGNOSIS — I252 Old myocardial infarction: Secondary | ICD-10-CM | POA: Diagnosis not present

## 2018-07-05 DIAGNOSIS — K5900 Constipation, unspecified: Secondary | ICD-10-CM | POA: Diagnosis not present

## 2018-07-05 DIAGNOSIS — G8929 Other chronic pain: Secondary | ICD-10-CM | POA: Diagnosis not present

## 2018-07-05 DIAGNOSIS — Z85828 Personal history of other malignant neoplasm of skin: Secondary | ICD-10-CM | POA: Diagnosis not present

## 2018-07-05 DIAGNOSIS — Z66 Do not resuscitate: Secondary | ICD-10-CM | POA: Diagnosis not present

## 2018-07-05 DIAGNOSIS — K219 Gastro-esophageal reflux disease without esophagitis: Secondary | ICD-10-CM | POA: Diagnosis not present

## 2018-07-05 DIAGNOSIS — Z961 Presence of intraocular lens: Secondary | ICD-10-CM | POA: Diagnosis not present

## 2018-07-05 DIAGNOSIS — L03113 Cellulitis of right upper limb: Secondary | ICD-10-CM | POA: Diagnosis not present

## 2018-07-05 DIAGNOSIS — R2689 Other abnormalities of gait and mobility: Secondary | ICD-10-CM | POA: Diagnosis not present

## 2018-07-05 DIAGNOSIS — Z885 Allergy status to narcotic agent status: Secondary | ICD-10-CM | POA: Diagnosis not present

## 2018-07-05 DIAGNOSIS — R42 Dizziness and giddiness: Secondary | ICD-10-CM | POA: Diagnosis not present

## 2018-07-05 DIAGNOSIS — E039 Hypothyroidism, unspecified: Secondary | ICD-10-CM | POA: Diagnosis not present

## 2018-07-05 DIAGNOSIS — Z8719 Personal history of other diseases of the digestive system: Secondary | ICD-10-CM | POA: Diagnosis not present

## 2018-07-05 DIAGNOSIS — Z9842 Cataract extraction status, left eye: Secondary | ICD-10-CM | POA: Diagnosis not present

## 2018-07-05 DIAGNOSIS — M6281 Muscle weakness (generalized): Secondary | ICD-10-CM | POA: Diagnosis not present

## 2018-07-05 DIAGNOSIS — R112 Nausea with vomiting, unspecified: Secondary | ICD-10-CM | POA: Diagnosis not present

## 2018-07-05 DIAGNOSIS — Z743 Need for continuous supervision: Secondary | ICD-10-CM | POA: Diagnosis not present

## 2018-07-05 DIAGNOSIS — R279 Unspecified lack of coordination: Secondary | ICD-10-CM | POA: Diagnosis not present

## 2018-07-05 DIAGNOSIS — Z9841 Cataract extraction status, right eye: Secondary | ICD-10-CM | POA: Diagnosis not present

## 2018-07-05 DIAGNOSIS — I808 Phlebitis and thrombophlebitis of other sites: Secondary | ICD-10-CM | POA: Diagnosis not present

## 2018-07-05 DIAGNOSIS — I2510 Atherosclerotic heart disease of native coronary artery without angina pectoris: Secondary | ICD-10-CM | POA: Diagnosis not present

## 2018-07-05 DIAGNOSIS — H8111 Benign paroxysmal vertigo, right ear: Secondary | ICD-10-CM | POA: Diagnosis not present

## 2018-07-05 DIAGNOSIS — H8112 Benign paroxysmal vertigo, left ear: Secondary | ICD-10-CM | POA: Diagnosis not present

## 2018-07-05 DIAGNOSIS — E890 Postprocedural hypothyroidism: Secondary | ICD-10-CM | POA: Diagnosis not present

## 2018-07-05 DIAGNOSIS — R609 Edema, unspecified: Secondary | ICD-10-CM | POA: Diagnosis not present

## 2018-07-05 DIAGNOSIS — Z791 Long term (current) use of non-steroidal anti-inflammatories (NSAID): Secondary | ICD-10-CM | POA: Diagnosis not present

## 2018-07-05 DIAGNOSIS — R0902 Hypoxemia: Secondary | ICD-10-CM | POA: Diagnosis not present

## 2018-07-05 DIAGNOSIS — R531 Weakness: Secondary | ICD-10-CM | POA: Diagnosis not present

## 2018-07-05 DIAGNOSIS — Z888 Allergy status to other drugs, medicaments and biological substances status: Secondary | ICD-10-CM | POA: Diagnosis not present

## 2018-07-05 MED ORDER — FLEET ENEMA 7-19 GM/118ML RE ENEM
1.0000 | ENEMA | Freq: Once | RECTAL | Status: AC
  Administered 2018-07-05: 1 via RECTAL
  Filled 2018-07-05: qty 1

## 2018-07-05 MED ORDER — DOUBLE ANTIBIOTIC 500-10000 UNIT/GM EX OINT
TOPICAL_OINTMENT | Freq: Two times a day (BID) | CUTANEOUS | Status: DC
  Administered 2018-07-05: 12:00:00 via TOPICAL
  Filled 2018-07-05: qty 28.4

## 2018-07-05 MED ORDER — METOPROLOL SUCCINATE ER 25 MG PO TB24: 12.5000 mg | ORAL_TABLET | Freq: Every day | ORAL | Status: DC

## 2018-07-05 MED ORDER — ALPRAZOLAM 0.5 MG PO TABS: 0.5000 mg | ORAL_TABLET | Freq: Two times a day (BID) | ORAL | 0 refills | Status: DC | PRN

## 2018-07-05 MED ORDER — DOUBLE ANTIBIOTIC 500-10000 UNIT/GM EX OINT: 1.0000 "application " | TOPICAL_OINTMENT | Freq: Two times a day (BID) | CUTANEOUS | Status: DC

## 2018-07-05 NOTE — Progress Notes (Signed)
Redness and infected spot noted to right AC. Traveling of redness up arm with warmness noted. House coverage paged.

## 2018-07-05 NOTE — Progress Notes (Signed)
Pt. Transferred to [facility] via [place].

## 2018-07-05 NOTE — Progress Notes (Signed)
While patient was attempting to have BM , pt began to feel " not good " and states she had been straining to have a BM ; pt taken back to bed , bp at this time was 77/56 and hr 48 ; at this time place pt flat in bed in  trendelenburg  ; rechecked bp after 2 min , bp 102/60  And hr 50; Dr. Eliseo Squires aware , per dr. Lucianne Lei hold BP meds and give pt enema; no further orders at this time ; pt alert and oriented x4 ; no s/s of distress at this time

## 2018-07-05 NOTE — Progress Notes (Signed)
Called [facility] to conirm arrival time ; per [facility] ; they did not have her on the pick up list ; requested [facility] ASAP for patient at this time

## 2018-07-05 NOTE — Progress Notes (Signed)
Gave report to brandy, lpn from [facility]

## 2018-07-05 NOTE — Discharge Instructions (Signed)
Dizziness °Dizziness is a common problem. It makes you feel unsteady or light-headed. You may feel like you are about to pass out (faint). Dizziness can lead to getting hurt if you stumble or fall. Dizziness can be caused by many things, including: °· Medicines. °· Not having enough water in your body (dehydration). °· Illness. ° °Follow these instructions at home: °Eating and drinking °· Drink enough fluid to keep your pee (urine) clear or pale yellow. This helps to keep you from getting dehydrated. Try to drink more clear fluids, such as water. °· Do not drink alcohol. °· Limit how much caffeine you drink or eat, if your doctor tells you to do that. °· Limit how much salt (sodium) you drink or eat, if your doctor tells you to do that. °Activity °· Avoid making quick movements. °? When you stand up from sitting in a chair, steady yourself until you feel okay. °? In the morning, first sit up on the side of the bed. When you feel okay, stand slowly while you hold onto something. Do this until you know that your balance is fine. °· If you need to stand in one place for a long time, move your legs often. Tighten and relax the muscles in your legs while you are standing. °· Do not drive or use heavy machinery if you feel dizzy. °· Avoid bending down if you feel dizzy. Place items in your home so you can reach them easily without leaning over. °Lifestyle °· Do not use any products that contain nicotine or tobacco, such as cigarettes and e-cigarettes. If you need help quitting, ask your doctor. °· Try to lower your stress level. You can do this by using methods such as yoga or meditation. Talk with your doctor if you need help. °General instructions °· Watch your dizziness for any changes. °· Take over-the-counter and prescription medicines only as told by your doctor. Talk with your doctor if you think that you are dizzy because of a medicine that you are taking. °· Tell a friend or a family member that you are feeling  dizzy. If he or she notices any changes in your behavior, have this person call your doctor. °· Keep all follow-up visits as told by your doctor. This is important. °Contact a doctor if: °· Your dizziness does not go away. °· Your dizziness or light-headedness gets worse. °· You feel sick to your stomach (nauseous). °· You have trouble hearing. °· You have new symptoms. °· You are unsteady on your feet. °· You feel like the room is spinning. °Get help right away if: °· You throw up (vomit) or have watery poop (diarrhea), and you cannot eat or drink anything. °· You have trouble: °? Talking. °? Walking. °? Swallowing. °? Using your arms, hands, or legs. °· You feel generally weak. °· You are not thinking clearly, or you have trouble forming sentences. A friend or family member may notice this. °· You have: °? Chest pain. °? Pain in your belly (abdomen). °? Shortness of breath. °? Sweating. °· Your vision changes. °· You are bleeding. °· You have a very bad headache. °· You have neck pain or a stiff neck. °· You have a fever. °These symptoms may be an emergency. Do not wait to see if the symptoms will go away. Get medical help right away. Call your local emergency services (911 in the [place] Do not drive yourself to the hospital. °Summary °· Dizziness makes you feel unsteady or light-headed. You may   feel like you are about to pass out (faint).  Drink enough fluid to keep your pee (urine) clear or pale yellow. Do not drink alcohol.  Avoid making quick movements if you feel dizzy.  Watch your dizziness for any changes. This information is not intended to replace advice given to you by your health care provider. Make sure you discuss any questions you have with your health care provider. Document Released: 08/18/2011 Document Revised: 09/15/2016 Document Reviewed: 09/15/2016 Elsevier Interactive Patient Education  2017 Elsevier Inc.   Benign Positional Vertigo Vertigo is the feeling that you or your  surroundings are moving when they are not. Benign positional vertigo is the most common form of vertigo. The cause of this condition is not serious (is benign). This condition is triggered by certain movements and positions (is positional). This condition can be dangerous if it occurs while you are doing something that could endanger you or others, such as driving. What are the causes? In many cases, the cause of this condition is not known. It may be caused by a disturbance in an area of the inner ear that helps your brain to sense movement and balance. This disturbance can be caused by a viral infection (labyrinthitis), head injury, or repetitive motion. What increases the risk? This condition is more likely to develop in:  Women.  People who are 82 years of age or older.  What are the signs or symptoms? Symptoms of this condition usually happen when you move your head or your eyes in different directions. Symptoms may start suddenly, and they usually last for less than a minute. Symptoms may include:  Loss of balance and falling.  Feeling like you are spinning or moving.  Feeling like your surroundings are spinning or moving.  Nausea and vomiting.  Blurred vision.  Dizziness.  Involuntary eye movement (nystagmus).  Symptoms can be mild and cause only slight annoyance, or they can be severe and interfere with daily life. Episodes of benign positional vertigo may return (recur) over time, and they may be triggered by certain movements. Symptoms may improve over time. How is this diagnosed? This condition is usually diagnosed by medical history and a physical exam of the head, neck, and ears. You may be referred to a health care provider who specializes in ear, nose, and throat (ENT) problems (otolaryngologist) or a provider who specializes in disorders of the nervous system (neurologist). You may have additional testing, including:  MRI.  A CT scan.  Eye movement tests. Your  health care provider may ask you to change positions quickly while he or she watches you for symptoms of benign positional vertigo, such as nystagmus. Eye movement may be tested with an electronystagmogram (ENG), caloric stimulation, the Dix-Hallpike test, or the roll test.  An electroencephalogram (EEG). This records electrical activity in your brain.  Hearing tests.  How is this treated? Usually, your health care provider will treat this by moving your head in specific positions to adjust your inner ear back to normal. Surgery may be needed in severe cases, but this is rare. In some cases, benign positional vertigo may resolve on its own in 2-4 weeks. Follow these instructions at home: Safety  Move slowly.Avoid sudden body or head movements.  Avoid driving.  Avoid operating heavy machinery.  Avoid doing any tasks that would be dangerous to you or others if a vertigo episode would occur.  If you have trouble walking or keeping your balance, try using a cane for stability. If you feel  dizzy or unstable, sit down right away.  Return to your normal activities as told by your health care provider. Ask your health care provider what activities are safe for you. General instructions  Take over-the-counter and prescription medicines only as told by your health care provider.  Avoid certain positions or movements as told by your health care provider.  Drink enough fluid to keep your urine clear or pale yellow.  Keep all follow-up visits as told by your health care provider. This is important. Contact a health care provider if:  You have a fever.  Your condition gets worse or you develop new symptoms.  Your family or friends notice any behavioral changes.  Your nausea or vomiting gets worse.  You have numbness or a pins and needles sensation. Get help right away if:  You have difficulty speaking or moving.  You are always dizzy.  You faint.  You develop severe  headaches.  You have weakness in your legs or arms.  You have changes in your hearing or vision.  You develop a stiff neck.  You develop sensitivity to light. This information is not intended to replace advice given to you by your health care provider. Make sure you discuss any questions you have with your health care provider. Document Released: 06/06/2006 Document Revised: 02/04/2016 Document Reviewed: 12/22/2014 Elsevier Interactive Patient Education  [employer].

## 2018-07-05 NOTE — Progress Notes (Signed)
Dr. Eliseo Squires at bedside and evaluating pt and Right AC area ; antibiotic cream will be ordered per MD ; no  further orders

## 2018-07-05 NOTE — Clinical Social Work Note (Signed)
CSW facilitated patient discharge including contacting patient family and facility to confirm patient discharge plans. Clinical information faxed to facility and family agreeable with plan. CSW arranged ambulance transport via [facility] to [facility]. RN to call report prior to discharge [phone]).  CSW will sign off for now as social work intervention is no longer needed. Please consult [place] again if new needs arise.  Dayton Scrape, [place]

## 2018-07-05 NOTE — Discharge Summary (Addendum)
Physician Discharge Summary  Andrea Jackson TDV:[id number] DOB: 1934/08/15 DOA: 06/30/2018  PCP: Andrea Bis, MD  Admit date: 06/30/2018 Discharge date: 07/05/2018  Admitted From: home Discharge disposition: SNF   Recommendations for Outpatient Follow-Up:   1. Vestibular PT at SNF 2. Heat compressed to right elbow area as well as abx ointment BID-- if not improving, consider oral antibiotics 3. Bowel regimen   Discharge Diagnosis:   Principal Problem:   BPPV (benign paroxysmal positional vertigo), left Active Problems:   Dizziness    Discharge Condition: Improved.  Diet recommendation: Low sodium, heart healthy.  Wound care: None.  Code status: DNR   History of Present Illness:   84 year old female history of hypertension, hypothyroidism, coronary artery disease, gastroesophageal reflux disease, small bowel ulcer who presented to the hospital after developing dizziness at home. She suddenly felt that the room was spinning around her at approximately 10 AM yesterday. She had multiple episodes of associated nausea and vomiting. She had no associated numbness of the extremities or weakness. All other symptoms. She has never had an episode like this before. Patient stated she stopped taking aspirin once her cardiologist told her she did not need it anymore. She also had a GI bleed in the past with an unclear source. She is currently undergoing evaluation for cause of dizzinessphysical therapy found that she had left-sided benign vertigo. Physical therapy thought the patient would benefit from inpatient skilled nursing facility however family initially felt adequate support at home to take care of the patient at home and that was theirplan.  Once they saw the patient working with physical therapy felt they could not take her home and have requested skilled nursing facility placement.   Hospital Course by Problem:   BPPV: Meclizine begun yesterday.  MRI  negative for CVA -seen by PT, patient lives alone-- needs SNF for short term vestibular rehab -carotids normal  Hypothyroid -continue synthroid  CAD -continue imdur  HTN -improved  Phlebitis of right elbow -heat compresses -topical ointment, if not improving, may need PO abx  Constipation Needs bowel regimen for daily BMs and to avoid straining and vasovagal response   Medical Consultants:      Discharge Exam:   Vitals:   07/05/18 0543 07/05/18 1249  BP: 128/60 110/70  Pulse: (!) 55 (!) 54  Resp: 16 20  Temp: (!) 97.5 F (36.4 C) (!) 97.4 F (36.3 C)  SpO2: 96% 93%   Vitals:   07/04/18 1746 07/04/18 2313 07/05/18 0543 07/05/18 1249  BP: (!) 106/57 96/60 128/60 110/70  Pulse: 60 (!) 54 (!) 55 (!) 54  Resp: 16 16 16 20   Temp: 98.7 F (37.1 C) 97.7 F (36.5 C) (!) 97.5 F (36.4 C) (!) 97.4 F (36.3 C)  TempSrc: Oral Oral Oral Oral  SpO2: 97% 94% 96% 93%  Weight:      Height:        General exam: Appears calm and comfortable.     The results of significant diagnostics from this hospitalization (including imaging, microbiology, ancillary and laboratory) are listed below for reference.     Procedures and Diagnostic Studies:   Ct Head Wo Contrast  Result Date: 06/30/2018 CLINICAL DATA:  Dizziness and 10 a.m. EXAM: CT HEAD WITHOUT CONTRAST TECHNIQUE: Contiguous axial images were obtained from the base of the skull through the vertex without intravenous contrast. COMPARISON:  11/30/2014 head CT FINDINGS: Brain: Chronic stable involutional changes of the brain. Chronic minimal small vessel ischemic disease of  periventricular white matter. No hydrocephalus. No intra-axial mass, hemorrhage, midline shift or edema. No large vascular territory infarct. Midline fourth ventricle and basal cisterns. The brainstem and cerebellum appear intact. Vascular: Atherosclerosis of the left vertebral both cavernous carotid arteries. No hyperdense vessel sign. Unexpected  calcifications. Skull: Normal. Negative for fracture or focal lesion. Sinuses/Orbits: No acute finding. Other: None. IMPRESSION: Atrophy with chronic minimal small vessel ischemic disease. No acute intracranial abnormality is noted. Electronically Signed   By: Ashley Royalty M.D.   On: 06/30/2018 18:27   Mr Brain Wo Contrast  Result Date: 07/01/2018 CLINICAL DATA:  Vertigo with nausea and vomiting. EXAM: MRI HEAD WITHOUT CONTRAST TECHNIQUE: Multiplanar, multiecho pulse sequences of the brain and surrounding structures were obtained without intravenous contrast. COMPARISON:  Head CT 06/30/2017 FINDINGS: BRAIN: There is no acute infarct, acute hemorrhage, hydrocephalus or extra-axial collection. The midline structures are normal. No midline shift or other mass effect. Multifocal white matter hyperintensity, most commonly due to chronic ischemic microangiopathy. The cerebral and cerebellar volume are age-appropriate. Susceptibility-sensitive sequences show no chronic microhemorrhage or superficial siderosis. VASCULAR: Major intracranial arterial and venous sinus flow voids are normal. SKULL AND UPPER CERVICAL SPINE: Calvarial bone marrow signal is normal. There is no skull base mass. Visualized upper cervical spine and soft tissues are normal. SINUSES/ORBITS: No fluid levels or advanced mucosal thickening. No mastoid or middle ear effusion. The orbits are normal. IMPRESSION: Chronic small vessel ischemia without acute intracranial abnormality. Electronically Signed   By: Ulyses Jarred M.D.   On: 07/01/2018 05:06   Dg Chest Portable 1 View  Result Date: 06/30/2018 CLINICAL DATA:  Hypoxia EXAM: PORTABLE CHEST 1 VIEW COMPARISON:  12/17/2017 chest radiograph. FINDINGS: Stable cardiomediastinal silhouette with normal heart size. No pneumothorax. No pleural effusion. Lungs appear clear, with no acute consolidative airspace disease and no pulmonary edema. IMPRESSION: No active disease. Electronically Signed   By: Ilona Sorrel M.D.   On: 06/30/2018 20:41     Labs:   Basic Metabolic Panel: Recent Labs  Lab 06/30/18 1735 07/01/18 0049  NA 136 137  K 3.7 3.6  CL 102 103  CO2 27 29  GLUCOSE 142* 118*  BUN 26* 19  CREATININE 0.82 0.85  CALCIUM 8.7* 8.9   GFR Estimated Creatinine Clearance: 46.5 mL/min (by C-G formula based on SCr of 0.85 mg/dL). Liver Function Tests: Recent Labs  Lab 07/01/18 0049  AST 23  ALT 26  ALKPHOS 51  BILITOT 0.6  PROT 5.4*  ALBUMIN 3.0*   No results for input(s): LIPASE, AMYLASE in the last 168 hours. No results for input(s): AMMONIA in the last 168 hours. Coagulation profile No results for input(s): INR, PROTIME in the last 168 hours.  CBC: Recent Labs  Lab 06/30/18 1735 07/01/18 0049  WBC 4.2 5.0  HGB 13.2 12.9  HCT 41.8 41.7  MCV 93.3 90.8  PLT 181 198   Cardiac Enzymes: No results for input(s): CKTOTAL, CKMB, CKMBINDEX, TROPONINI in the last 168 hours. BNP: Invalid input(s): POCBNP CBG: No results for input(s): GLUCAP in the last 168 hours. D-Dimer No results for input(s): DDIMER in the last 72 hours. Hgb A1c No results for input(s): HGBA1C in the last 72 hours. Lipid Profile No results for input(s): CHOL, HDL, LDLCALC, TRIG, CHOLHDL, LDLDIRECT in the last 72 hours. Thyroid function studies No results for input(s): TSH, T4TOTAL, T3FREE, THYROIDAB in the last 72 hours.  Invalid input(s): FREET3 Anemia work up No results for input(s): VITAMINB12, FOLATE, FERRITIN, TIBC, IRON, RETICCTPCT in  the last 72 hours. Microbiology No results found for this or any previous visit (from the past 240 hour(s)).   Discharge Instructions:   Discharge Instructions    Diet - low sodium heart healthy   Complete by:  As directed    Increase activity slowly   Complete by:  As directed      Allergies as of 07/05/2018      Reactions   Demerol [meperidine]    Patient stated she had a "reaction" to Demerol. Swollen lip.   Celecoxib    REACTION: hyper,  couldn't eat or sleep   Codeine    REACTION: itching   Oxycodone-acetaminophen Itching   Statins Other (See Comments)   Muscle aches, can not tolerate any of them per patient.        Medication List    STOP taking these medications   hydrochlorothiazide 12.5 MG capsule Commonly known as:  MICROZIDE     TAKE these medications   ALPRAZolam 0.5 MG tablet Commonly known as:  XANAX Take 1 tablet (0.5 mg total) by mouth 2 (two) times daily as needed for anxiety or sleep. For anxiety What changed:    how much to take  when to take this  reasons to take this   bisacodyl 5 MG EC tablet Commonly known as:  DULCOLAX Take 5 mg by mouth daily as needed for moderate constipation.   diclofenac 75 MG EC tablet Commonly known as:  VOLTAREN Take 1 tablet by mouth. Takes 1-2 times per day   diclofenac sodium 1 % Gel Commonly known as:  VOLTAREN diclofenac 1 % topical gel  APPLY 2-4 GRAMS FOUR TIMES DAILY AS NEEDED FOR INFLAMMATION   esomeprazole 40 MG capsule Commonly known as:  NEXIUM Take 1 tablet by mouth as needed (acid reflux).   Fish Oil 1000 MG Caps Take 1 capsule by mouth daily.   isosorbide mononitrate 30 MG 24 hr tablet Commonly known as:  IMDUR TAKE 1/2 TABLET BY MOUTH DAILY   levothyroxine 150 MCG tablet Commonly known as:  SYNTHROID, LEVOTHROID Take 150 mcg by mouth daily before breakfast.   losartan 100 MG tablet Commonly known as:  COZAAR Take 50 mg by mouth daily. for high blood pressure   LOTEMAX 0.5 % ophthalmic suspension Generic drug:  loteprednol as needed.   meclizine 12.5 MG tablet Commonly known as:  ANTIVERT Take 1 tablet (12.5 mg total) by mouth 3 (three) times daily as needed for dizziness.   metoprolol succinate 25 MG 24 hr tablet Commonly known as:  TOPROL-XL Take 0.5 tablets (12.5 mg total) by mouth daily. Start taking on:  07/06/2018 What changed:  how much to take   nitroGLYCERIN 0.4 MG SL tablet Commonly known as:   NITROSTAT Place 0.4 mg under the tongue as needed for chest pain.   polyethylene glycol packet Commonly known as:  MIRALAX / GLYCOLAX Take 17 g by mouth daily as needed for mild constipation.   polymixin-bacitracin 500-10000 UNIT/GM Oint ointment Apply 1 application topically 2 (two) times daily.   promethazine 25 MG tablet Commonly known as:  PHENERGAN Take 25 mg by mouth every 6 (six) hours as needed for nausea or vomiting.   RESTASIS 0.05 % ophthalmic emulsion Generic drug:  cycloSPORINE Place 1 drop into both eyes every evening.   traMADol 50 MG tablet Commonly known as:  ULTRAM Take 1 tablet (50 mg total) by mouth every 6 (six) hours as needed for severe pain. What changed:    reasons to take  this  additional instructions   vitamin B-12 250 MCG tablet Commonly known as:  CYANOCOBALAMIN Take 250 mcg by mouth as needed.            Durable Medical Equipment  (From admission, onward)         Start     Ordered   07/02/18 1321  For home use only DME 3 n 1  Once     07/02/18 1321          Contact information for follow-up providers    Andrea Bis, MD. Call in 2 day(s).   Specialty:  Family Medicine Contact information: [place] [address] [phone]        Satira Sark, MD Follow up in 2 week(s).   Specialty:  Cardiology Contact information: [place] [address] [phone]            Contact information for after-discharge care    Destination    [facility] .   Service:  Skilled Nursing Contact information: [address] [place] [phone]                   Time coordinating discharge: 25 min  Signed:  Geradine Girt  [facility] 07/05/2018, 3:06 PM

## 2018-07-05 NOTE — Plan of Care (Signed)
Pt alert and oriented x4. Pt aware of goal to be placed in long term care. Pt awaiting placement to facility. Pt aware that she requires assistance to manage health-related needs. Complications avoided during hospitalization. Pt ready for discharge.

## 2018-07-05 NOTE — Progress Notes (Signed)
Spoke with Opyd, MD and said for pt. To use warm compress on spot and pt. Is okay to be transported by [facility].

## 2018-07-05 NOTE — Clinical Social Work Placement (Signed)
   CLINICAL SOCIAL WORK PLACEMENT  NOTE  Date:  07/05/2018  Patient Details  Name: Andrea Jackson MRN: [id number] Date of Birth: 1933-10-18  Clinical Social Work is seeking post-discharge placement for this patient at the [place] level of care (*CSW will initial, date and re-position this form in  chart as items are completed):  Yes   Patient/family provided with [place] Work Department's list of facilities offering this level of care within the geographic area requested by the patient (or if unable, by the patient's family).  Yes   Patient/family informed of their freedom to choose among providers that offer the needed level of care, that participate in Medicare, Medicaid or managed care program needed by the patient, have an available bed and are willing to accept the patient.  Yes   Patient/family informed of [facility]'s ownership interest in [facility] and [facility], as well as of the fact that they are under no obligation to receive care at these facilities.  PASRR submitted to EDS on 07/03/18     PASRR number received on 07/03/18     Existing PASRR number confirmed on       FL2 transmitted to all facilities in geographic area requested by pt/family on 07/03/18     FL2 transmitted to all facilities within larger geographic area on       Patient informed that his/her managed care company has contracts with or will negotiate with certain facilities, including the following:        Yes   Patient/family informed of bed offers received.  Patient chooses bed at [facility])     Physician recommends and patient chooses bed at      Patient to be transferred to [facility]) on 07/05/18.  Patient to be transferred to facility by [facility]     Patient family notified on 07/05/18 of transfer.  Name of family member notified:  Marlaine Hind     PHYSICIAN       Additional Comment:     _______________________________________________ Candie Chroman, LCSW 07/05/2018, 2:29 PM

## 2018-07-05 NOTE — Clinical Social Work Note (Addendum)
SNF RN is checking messages to see if insurance authorization has been approved yet or not. If no message, they will call [facility] to check status.  Dayton Scrape, [place] [phone]  10:07 am Patient has insurance approval to discharge to SNF today.  Dayton Scrape, [place]

## 2018-07-06 ENCOUNTER — Ambulatory Visit: Payer: Medicare Other | Admitting: Internal Medicine

## 2018-07-06 DIAGNOSIS — L03113 Cellulitis of right upper limb: Secondary | ICD-10-CM | POA: Diagnosis not present

## 2018-07-06 DIAGNOSIS — H8111 Benign paroxysmal vertigo, right ear: Secondary | ICD-10-CM | POA: Diagnosis not present

## 2018-07-11 ENCOUNTER — Telehealth: Payer: Self-pay | Admitting: Cardiology

## 2018-07-11 NOTE — Telephone Encounter (Signed)
Yes patient was in the hospital on the 23rd and was advised to f/u with cardiologist 2 weeks after.

## 2018-07-11 NOTE — Telephone Encounter (Signed)
Patient was recently inpatient at [facility]. She is now at [facility]. They received a notice that she needs to have a 2 week follow up.Patient was recently seen by Dr. Domenic Polite.  Does patient still need a 2 week follow up ?
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
## 2018-07-20 ENCOUNTER — Ambulatory Visit: Payer: Medicare Other | Admitting: Cardiology

## 2018-07-23 ENCOUNTER — Other Ambulatory Visit: Payer: Self-pay

## 2018-07-23 NOTE — Patient Outreach (Signed)
[facility]) Care Management  07/23/2018  DERIAN PFOST 05/12/1934 [id number]   EMMI- General Discharge RED ON EMMI ALERT Day # 4 Date: 07/22/18 Red Alert Reason:  Lost interest in things? yes  Outreach attempt:spoke with patient.  She is able to verify HIPAA.  She states that the system recorded her wrong and that she is doing fine.  PHQ-2=0.  Patient states that she is independent with care and did everything herself at the rehab center.  Patient has follow up with Dr. Quillian Quince on 07-30-18 and she states that she still drives herself. She denies any needs.    Plan: RN CM will close case.    Jone Baseman, RN, MSN [facility] Care Management Care Management Coordinator Direct Line [phone] Toll Free: [phone]  Fax: [phone]

## 2018-08-16 ENCOUNTER — Encounter: Payer: Self-pay | Admitting: Internal Medicine

## 2018-08-17 DIAGNOSIS — I10 Essential (primary) hypertension: Secondary | ICD-10-CM | POA: Diagnosis not present

## 2018-08-17 DIAGNOSIS — H8110 Benign paroxysmal vertigo, unspecified ear: Secondary | ICD-10-CM | POA: Diagnosis not present

## 2018-08-28 DIAGNOSIS — D649 Anemia, unspecified: Secondary | ICD-10-CM | POA: Diagnosis not present

## 2018-08-28 DIAGNOSIS — I10 Essential (primary) hypertension: Secondary | ICD-10-CM | POA: Diagnosis not present

## 2018-08-28 DIAGNOSIS — D529 Folate deficiency anemia, unspecified: Secondary | ICD-10-CM | POA: Diagnosis not present

## 2018-08-28 DIAGNOSIS — D500 Iron deficiency anemia secondary to blood loss (chronic): Secondary | ICD-10-CM | POA: Diagnosis not present

## 2018-08-28 DIAGNOSIS — D519 Vitamin B12 deficiency anemia, unspecified: Secondary | ICD-10-CM | POA: Diagnosis not present

## 2018-08-28 DIAGNOSIS — K210 Gastro-esophageal reflux disease with esophagitis: Secondary | ICD-10-CM | POA: Diagnosis not present

## 2018-08-28 DIAGNOSIS — E039 Hypothyroidism, unspecified: Secondary | ICD-10-CM | POA: Diagnosis not present

## 2018-08-28 DIAGNOSIS — N183 Chronic kidney disease, stage 3 (moderate): Secondary | ICD-10-CM | POA: Diagnosis not present

## 2018-08-31 DIAGNOSIS — I10 Essential (primary) hypertension: Secondary | ICD-10-CM | POA: Diagnosis not present

## 2018-08-31 DIAGNOSIS — Z0001 Encounter for general adult medical examination with abnormal findings: Secondary | ICD-10-CM | POA: Diagnosis not present

## 2018-09-21 ENCOUNTER — Ambulatory Visit: Payer: Medicare Other | Admitting: Internal Medicine

## 2018-10-05 ENCOUNTER — Ambulatory Visit (INDEPENDENT_AMBULATORY_CARE_PROVIDER_SITE_OTHER): Payer: Medicare Other | Admitting: Internal Medicine

## 2018-10-05 ENCOUNTER — Encounter: Payer: Self-pay | Admitting: Internal Medicine

## 2018-10-05 VITALS — BP 149/80 | HR 58 | Temp 97.0°F | Ht 64.0 in | Wt 125.2 lb

## 2018-10-05 DIAGNOSIS — D508 Other iron deficiency anemias: Secondary | ICD-10-CM

## 2018-10-05 DIAGNOSIS — K219 Gastro-esophageal reflux disease without esophagitis: Secondary | ICD-10-CM | POA: Diagnosis not present

## 2018-10-05 NOTE — Patient Instructions (Signed)
GERD information  May continue  Esomeprazole as needed  Follow-up with Dr. Quillian Quince as planned  Follow-up with [place] on an as needed basis
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
## 2018-10-11 DIAGNOSIS — M810 Age-related osteoporosis without current pathological fracture: Secondary | ICD-10-CM | POA: Diagnosis not present

## 2018-10-12 DIAGNOSIS — E039 Hypothyroidism, unspecified: Secondary | ICD-10-CM | POA: Diagnosis not present

## 2018-10-15 ENCOUNTER — Other Ambulatory Visit: Payer: Self-pay | Admitting: Cardiology

## 2018-10-15 ENCOUNTER — Inpatient Hospital Stay (HOSPITAL_COMMUNITY): Payer: Medicare Other | Attending: Hematology

## 2018-10-15 DIAGNOSIS — Z85828 Personal history of other malignant neoplasm of skin: Secondary | ICD-10-CM | POA: Diagnosis not present

## 2018-10-15 DIAGNOSIS — F419 Anxiety disorder, unspecified: Secondary | ICD-10-CM | POA: Insufficient documentation

## 2018-10-15 DIAGNOSIS — E039 Hypothyroidism, unspecified: Secondary | ICD-10-CM | POA: Insufficient documentation

## 2018-10-15 DIAGNOSIS — I10 Essential (primary) hypertension: Secondary | ICD-10-CM | POA: Insufficient documentation

## 2018-10-15 DIAGNOSIS — I252 Old myocardial infarction: Secondary | ICD-10-CM | POA: Insufficient documentation

## 2018-10-15 DIAGNOSIS — Z79899 Other long term (current) drug therapy: Secondary | ICD-10-CM | POA: Diagnosis not present

## 2018-10-15 DIAGNOSIS — I2510 Atherosclerotic heart disease of native coronary artery without angina pectoris: Secondary | ICD-10-CM | POA: Diagnosis not present

## 2018-10-15 DIAGNOSIS — K219 Gastro-esophageal reflux disease without esophagitis: Secondary | ICD-10-CM | POA: Insufficient documentation

## 2018-10-15 DIAGNOSIS — E785 Hyperlipidemia, unspecified: Secondary | ICD-10-CM | POA: Diagnosis not present

## 2018-10-15 DIAGNOSIS — D508 Other iron deficiency anemias: Secondary | ICD-10-CM

## 2018-10-15 DIAGNOSIS — D509 Iron deficiency anemia, unspecified: Secondary | ICD-10-CM | POA: Insufficient documentation

## 2018-10-15 LAB — COMPREHENSIVE METABOLIC PANEL
ALBUMIN: 4 g/dL (ref 3.5–5.0)
ALK PHOS: 53 U/L (ref 38–126)
ALT: 28 U/L (ref 0–44)
ANION GAP: 8 (ref 5–15)
AST: 27 U/L (ref 15–41)
BILIRUBIN TOTAL: 0.6 mg/dL (ref 0.3–1.2)
BUN: 22 mg/dL (ref 8–23)
CO2: 28 mmol/L (ref 22–32)
CREATININE: 0.94 mg/dL (ref 0.44–1.00)
Calcium: 9.2 mg/dL (ref 8.9–10.3)
Chloride: 104 mmol/L (ref 98–111)
GFR calc Af Amer: >60 mL/min (ref >60)
GFR calc non Af Amer: 56 mL/min — ABNORMAL LOW (ref >60)
GLUCOSE: 102 mg/dL — AB (ref 70–99)
Potassium: 4.6 mmol/L (ref 3.5–5.1)
Sodium: 140 mmol/L (ref 135–145)
Total Protein: 6.8 g/dL (ref 6.5–8.1)

## 2018-10-15 LAB — CBC WITH DIFFERENTIAL/PLATELET
Abs Immature Granulocytes: 0.01 10*3/uL (ref 0.00–0.07)
Basophils Absolute: 0 10*3/uL (ref 0.0–0.1)
Basophils Relative: 1 %
EOS PCT: 2 %
Eosinophils Absolute: 0.1 10*3/uL (ref 0.0–0.5)
HEMATOCRIT: 45.5 % (ref 36.0–46.0)
HEMOGLOBIN: 14 g/dL (ref 12.0–15.0)
Immature Granulocytes: 0 %
LYMPHS PCT: 20 %
Lymphs Abs: 1.1 10*3/uL (ref 0.7–4.0)
MCH: 30.3 pg (ref 26.0–34.0)
MCHC: 30.8 g/dL (ref 30.0–36.0)
MCV: 98.5 fL (ref 80.0–100.0)
MONO ABS: 0.4 10*3/uL (ref 0.1–1.0)
Monocytes Relative: 8 %
Neutro Abs: 3.5 10*3/uL (ref 1.7–7.7)
Neutrophils Relative %: 69 %
Platelets: 238 10*3/uL (ref 150–400)
RBC: 4.62 MIL/uL (ref 3.87–5.11)
RDW: 12.9 % (ref 11.5–15.5)
WBC: 5.1 10*3/uL (ref 4.0–10.5)
nRBC: 0 % (ref 0.0–0.2)

## 2018-10-15 LAB — LACTATE DEHYDROGENASE: LDH: 201 U/L — ABNORMAL HIGH (ref 98–192)

## 2018-10-15 LAB — FERRITIN: Ferritin: 86 ng/mL (ref 11–307)

## 2018-10-22 ENCOUNTER — Encounter (HOSPITAL_COMMUNITY): Payer: Self-pay | Admitting: Hematology

## 2018-10-22 ENCOUNTER — Inpatient Hospital Stay (HOSPITAL_BASED_OUTPATIENT_CLINIC_OR_DEPARTMENT_OTHER): Payer: Medicare Other | Admitting: Hematology

## 2018-10-22 ENCOUNTER — Other Ambulatory Visit: Payer: Self-pay

## 2018-10-22 VITALS — BP 176/74 | HR 61 | Temp 97.8°F | Resp 16 | Wt 126.0 lb

## 2018-10-22 DIAGNOSIS — D509 Iron deficiency anemia, unspecified: Secondary | ICD-10-CM

## 2018-10-22 DIAGNOSIS — E785 Hyperlipidemia, unspecified: Secondary | ICD-10-CM | POA: Diagnosis not present

## 2018-10-22 DIAGNOSIS — Z85828 Personal history of other malignant neoplasm of skin: Secondary | ICD-10-CM | POA: Diagnosis not present

## 2018-10-22 DIAGNOSIS — Z79899 Other long term (current) drug therapy: Secondary | ICD-10-CM | POA: Diagnosis not present

## 2018-10-22 DIAGNOSIS — K219 Gastro-esophageal reflux disease without esophagitis: Secondary | ICD-10-CM | POA: Diagnosis not present

## 2018-10-22 DIAGNOSIS — E039 Hypothyroidism, unspecified: Secondary | ICD-10-CM | POA: Diagnosis not present

## 2018-10-22 DIAGNOSIS — I10 Essential (primary) hypertension: Secondary | ICD-10-CM | POA: Diagnosis not present

## 2018-10-22 DIAGNOSIS — I2510 Atherosclerotic heart disease of native coronary artery without angina pectoris: Secondary | ICD-10-CM | POA: Diagnosis not present

## 2018-10-22 DIAGNOSIS — I252 Old myocardial infarction: Secondary | ICD-10-CM | POA: Diagnosis not present

## 2018-10-22 DIAGNOSIS — D508 Other iron deficiency anemias: Secondary | ICD-10-CM

## 2018-10-22 NOTE — Assessment & Plan Note (Signed)
1.  Iron deficiency anemia: - EGD on 01/31/2017 shows web in the proximal esophagus and mild gastritis. -Colonoscopy on 08/03/2015 shows internal hemorrhoids, few scattered pancolonic diverticula with no evidence of inflammatory disease. - Patient has tried taking iron pills in the past, causes severe constipation.  She also had poor absorption for oral iron therapy. -She received Injectafer on 04/16/2018 and 04/23/2018. -We reviewed her blood work.  Hemoglobin is 14.  Ferritin has come down to 86.  I have recommended 1 infusion of Feraheme.  We talked about the side effects including serious allergic reaction. - We will see her back in 4 months for follow-up with repeat CBC and iron panel.

## 2018-10-22 NOTE — Patient Instructions (Signed)
[place] at [facility] Discharge Instructions  F   Thank you for choosing [place] at [facility] to provide your oncology and hematology care.  To afford each patient quality time with our provider, please arrive at least 15 minutes before your scheduled appointment time.   If you have a lab appointment with the [facility] please come in thru the  Main Entrance and check in at the main information desk  You need to re-schedule your appointment should you arrive 10 or more minutes late.  We strive to give you quality time with our providers, and arriving late affects you and other patients whose appointments are after yours.  Also, if you no show three or more times for appointments you may be dismissed from the clinic at the providers discretion.     Again, thank you for choosing [facility].  Our hope is that these requests will decrease the amount of time that you wait before being seen by our physicians.       _____________________________________________________________  Should you have questions after your visit to [facility], please contact our office at [phone] between the hours of 8:00 a.m. and 4:30 p.m.  Voicemails left after 4:00 p.m. will not be returned until the following business day.  For prescription refill requests, have your pharmacy contact our office and allow 72 hours.    Cancer Center Support Programs:   > Cancer Support Group  2nd Tuesday of the month 1pm-2pm, Journey Room
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
## 2018-10-23 ENCOUNTER — Encounter (HOSPITAL_COMMUNITY): Payer: Self-pay

## 2018-10-23 ENCOUNTER — Other Ambulatory Visit: Payer: Self-pay

## 2018-10-23 ENCOUNTER — Inpatient Hospital Stay (HOSPITAL_COMMUNITY): Payer: Medicare Other

## 2018-10-23 VITALS — BP 130/65 | HR 56 | Temp 97.9°F | Resp 16

## 2018-10-23 DIAGNOSIS — K219 Gastro-esophageal reflux disease without esophagitis: Secondary | ICD-10-CM | POA: Diagnosis not present

## 2018-10-23 DIAGNOSIS — E039 Hypothyroidism, unspecified: Secondary | ICD-10-CM | POA: Diagnosis not present

## 2018-10-23 DIAGNOSIS — I252 Old myocardial infarction: Secondary | ICD-10-CM | POA: Diagnosis not present

## 2018-10-23 DIAGNOSIS — I2510 Atherosclerotic heart disease of native coronary artery without angina pectoris: Secondary | ICD-10-CM | POA: Diagnosis not present

## 2018-10-23 DIAGNOSIS — I10 Essential (primary) hypertension: Secondary | ICD-10-CM | POA: Diagnosis not present

## 2018-10-23 DIAGNOSIS — D509 Iron deficiency anemia, unspecified: Secondary | ICD-10-CM | POA: Diagnosis not present

## 2018-10-23 DIAGNOSIS — E785 Hyperlipidemia, unspecified: Secondary | ICD-10-CM | POA: Diagnosis not present

## 2018-10-23 DIAGNOSIS — Z85828 Personal history of other malignant neoplasm of skin: Secondary | ICD-10-CM | POA: Diagnosis not present

## 2018-10-23 DIAGNOSIS — D508 Other iron deficiency anemias: Secondary | ICD-10-CM

## 2018-10-23 DIAGNOSIS — Z79899 Other long term (current) drug therapy: Secondary | ICD-10-CM | POA: Diagnosis not present

## 2018-10-23 MED ORDER — SODIUM CHLORIDE 0.9 % IV SOLN
510.0000 mg | Freq: Once | INTRAVENOUS | Status: AC
  Administered 2018-10-23: 510 mg via INTRAVENOUS
  Filled 2018-10-23: qty 510

## 2018-10-23 MED ORDER — SODIUM CHLORIDE 0.9 % IV SOLN
Freq: Once | INTRAVENOUS | Status: AC
  Administered 2018-10-23: 09:00:00 via INTRAVENOUS

## 2018-10-23 NOTE — Progress Notes (Signed)
Pt presents today for Feraheme infusion. VSS. Pt has no complaints of any significant changes since last visit. MAR updated and reviewed.   Feraheme administered today per MD orders. Tolerated infusion without adverse affects. Vital signs stable. No complaints at this time. Discharged from clinic ambulatory. F/U with [facility] as scheduled.

## 2018-10-23 NOTE — Patient Instructions (Signed)
[facility] at [facility]  Discharge Instructions:   _______________________________________________________________  Thank you for choosing [facility] at [facility] to provide your oncology and hematology care.  To afford each patient quality time with our providers, please arrive at least 15 minutes before your scheduled appointment.  You need to re-schedule your appointment if you arrive 10 or more minutes late.  We strive to give you quality time with our providers, and arriving late affects you and other patients whose appointments are after yours.  Also, if you no show three or more times for appointments you may be dismissed from the clinic.  Again, thank you for choosing  [facility]. Our hope is that these requests will allow you access to exceptional care and in a timely manner. _______________________________________________________________  If you have questions after your visit, please contact our office at [phone] between the hours of 8:30 a.m. and 5:00 p.m. Voicemails left after 4:30 p.m. will not be returned until the following business day. _______________________________________________________________  For prescription refill requests, have your pharmacy contact our office. _______________________________________________________________  Recommendations made by the consultant and any test results will be sent to your referring physician. _______________________________________________________________ 

## 2018-11-28 DIAGNOSIS — E039 Hypothyroidism, unspecified: Secondary | ICD-10-CM | POA: Diagnosis not present

## 2018-12-27 DIAGNOSIS — K210 Gastro-esophageal reflux disease with esophagitis: Secondary | ICD-10-CM | POA: Diagnosis not present

## 2018-12-27 DIAGNOSIS — E7800 Pure hypercholesterolemia, unspecified: Secondary | ICD-10-CM | POA: Diagnosis not present

## 2018-12-27 DIAGNOSIS — I10 Essential (primary) hypertension: Secondary | ICD-10-CM | POA: Diagnosis not present

## 2018-12-27 DIAGNOSIS — N183 Chronic kidney disease, stage 3 (moderate): Secondary | ICD-10-CM | POA: Diagnosis not present

## 2018-12-27 DIAGNOSIS — E782 Mixed hyperlipidemia: Secondary | ICD-10-CM | POA: Diagnosis not present

## 2018-12-31 DIAGNOSIS — E039 Hypothyroidism, unspecified: Secondary | ICD-10-CM | POA: Diagnosis not present

## 2018-12-31 DIAGNOSIS — M8588 Other specified disorders of bone density and structure, other site: Secondary | ICD-10-CM | POA: Diagnosis not present

## 2018-12-31 DIAGNOSIS — E441 Mild protein-calorie malnutrition: Secondary | ICD-10-CM | POA: Diagnosis not present

## 2018-12-31 DIAGNOSIS — I2510 Atherosclerotic heart disease of native coronary artery without angina pectoris: Secondary | ICD-10-CM | POA: Diagnosis not present

## 2018-12-31 DIAGNOSIS — M5136 Other intervertebral disc degeneration, lumbar region: Secondary | ICD-10-CM | POA: Diagnosis not present

## 2018-12-31 DIAGNOSIS — M4727 Other spondylosis with radiculopathy, lumbosacral region: Secondary | ICD-10-CM | POA: Diagnosis not present

## 2018-12-31 DIAGNOSIS — I10 Essential (primary) hypertension: Secondary | ICD-10-CM | POA: Diagnosis not present

## 2018-12-31 DIAGNOSIS — E782 Mixed hyperlipidemia: Secondary | ICD-10-CM | POA: Diagnosis not present

## 2018-12-31 DIAGNOSIS — M7062 Trochanteric bursitis, left hip: Secondary | ICD-10-CM | POA: Diagnosis not present

## 2019-01-10 DIAGNOSIS — M48061 Spinal stenosis, lumbar region without neurogenic claudication: Secondary | ICD-10-CM | POA: Diagnosis not present

## 2019-01-10 DIAGNOSIS — M545 Low back pain: Secondary | ICD-10-CM | POA: Diagnosis not present

## 2019-01-10 DIAGNOSIS — M5127 Other intervertebral disc displacement, lumbosacral region: Secondary | ICD-10-CM | POA: Diagnosis not present

## 2019-01-10 DIAGNOSIS — M4726 Other spondylosis with radiculopathy, lumbar region: Secondary | ICD-10-CM | POA: Diagnosis not present

## 2019-01-10 DIAGNOSIS — M4316 Spondylolisthesis, lumbar region: Secondary | ICD-10-CM | POA: Diagnosis not present

## 2019-01-11 ENCOUNTER — Encounter (HOSPITAL_COMMUNITY): Payer: Self-pay

## 2019-01-11 ENCOUNTER — Emergency Department (HOSPITAL_COMMUNITY)
Admission: EM | Admit: 2019-01-11 | Discharge: 2019-01-11 | Disposition: A | Discharge status: Home / Self Care | Payer: Medicare Other | Attending: Emergency Medicine | Admitting: Emergency Medicine

## 2019-01-11 ENCOUNTER — Other Ambulatory Visit: Payer: Self-pay

## 2019-01-11 ENCOUNTER — Emergency Department (HOSPITAL_COMMUNITY): Payer: Medicare Other

## 2019-01-11 DIAGNOSIS — I10 Essential (primary) hypertension: Secondary | ICD-10-CM | POA: Diagnosis not present

## 2019-01-11 DIAGNOSIS — I252 Old myocardial infarction: Secondary | ICD-10-CM | POA: Diagnosis not present

## 2019-01-11 DIAGNOSIS — I959 Hypotension, unspecified: Secondary | ICD-10-CM | POA: Diagnosis not present

## 2019-01-11 DIAGNOSIS — R079 Chest pain, unspecified: Secondary | ICD-10-CM | POA: Diagnosis not present

## 2019-01-11 DIAGNOSIS — Z79899 Other long term (current) drug therapy: Secondary | ICD-10-CM | POA: Diagnosis not present

## 2019-01-11 DIAGNOSIS — E039 Hypothyroidism, unspecified: Secondary | ICD-10-CM | POA: Insufficient documentation

## 2019-01-11 LAB — BASIC METABOLIC PANEL
Anion gap: 8 (ref 5–15)
BUN: 34 mg/dL — ABNORMAL HIGH (ref 8–23)
CO2: 29 mmol/L (ref 22–32)
Calcium: 8.8 mg/dL — ABNORMAL LOW (ref 8.9–10.3)
Chloride: 98 mmol/L (ref 98–111)
Creatinine, Ser: 1.09 mg/dL — ABNORMAL HIGH (ref 0.44–1.00)
GFR calc Af Amer: 55 mL/min — ABNORMAL LOW (ref >60)
GFR calc non Af Amer: 47 mL/min — ABNORMAL LOW (ref >60)
Glucose, Bld: 114 mg/dL — ABNORMAL HIGH (ref 70–99)
Potassium: 4.1 mmol/L (ref 3.5–5.1)
Sodium: 135 mmol/L (ref 135–145)

## 2019-01-11 LAB — CBC
HCT: 42.8 % (ref 36.0–46.0)
Hemoglobin: 13.6 g/dL (ref 12.0–15.0)
MCH: 30.8 pg (ref 26.0–34.0)
MCHC: 31.8 g/dL (ref 30.0–36.0)
MCV: 96.8 fL (ref 80.0–100.0)
Platelets: 200 10*3/uL (ref 150–400)
RBC: 4.42 MIL/uL (ref 3.87–5.11)
RDW: 13.7 % (ref 11.5–15.5)
WBC: 5 10*3/uL (ref 4.0–10.5)
nRBC: 0 % (ref 0.0–0.2)

## 2019-01-11 LAB — TROPONIN I: Troponin I: <0.03 ng/mL (ref <0.03)

## 2019-01-11 MED ORDER — SODIUM CHLORIDE 0.9% FLUSH
3.0000 mL | Freq: Once | INTRAVENOUS | Status: AC
  Administered 2019-01-11: 3 mL via INTRAVENOUS

## 2019-01-11 MED ORDER — NITROGLYCERIN 0.4 MG SL SUBL: 0.4000 mg | SUBLINGUAL_TABLET | SUBLINGUAL | 0 refills | Status: DC | PRN

## 2019-01-11 NOTE — ED Triage Notes (Signed)
Pt in from home by [facility] for chest pain.  Pt took 2 of her own nitro and 4 baby aspirin with minimal relief.

## 2019-01-11 NOTE — Discharge Instructions (Addendum)
Tests show no evidence of a heart attack.  Prescription for nitroglycerin tablets.  Call your cardiologist Monday for a follow-up appointment next week.  Return here if worse.

## 2019-01-11 NOTE — ED Notes (Signed)
To Rad 
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
## 2019-01-11 NOTE — ED Notes (Signed)
Dr Lacinda Axon in to discuss with pt

## 2019-01-17 DIAGNOSIS — M5416 Radiculopathy, lumbar region: Secondary | ICD-10-CM | POA: Diagnosis not present

## 2019-01-17 DIAGNOSIS — M4727 Other spondylosis with radiculopathy, lumbosacral region: Secondary | ICD-10-CM | POA: Diagnosis not present

## 2019-01-22 ENCOUNTER — Telehealth: Payer: Self-pay | Admitting: Cardiology

## 2019-01-22 NOTE — Telephone Encounter (Signed)
5 pinched nerves in her spine.  Last Thursday, saw Raliegh Ip & had spinal injection - does not think it is helping. Said that doctor told her it may get worse before its gets better.  Is taking the Diclofenac & did take Tylenol Arthritis yesterday.  This pain is in Left shoulder & goes down to left leg. X last month - pain down back of leg & top of her left foot.  States that she is not sure that this is cardiac pain.  Stated that she took Ntg x 2 and 4 baby ASA on the day she went to the ED (01/11/2019).  Findings at ED looked like they were negative.  Did see pmd about 2 weeks ago & had a good check up.

## 2019-01-22 NOTE — Telephone Encounter (Signed)
Noted.  Thank you for update.  I did review her ER visit records, troponin I was negative and ECG nonacute.  This may well be related to her spine pain and neuropathy.

## 2019-01-22 NOTE — Telephone Encounter (Signed)
Have been having pain that goes down her left arm and leg. Has been going on for a good while and cant take it any longer. Took two Nitro yesterday but has not today.

## 2019-01-23 NOTE — Telephone Encounter (Signed)
No answer

## 2019-01-23 NOTE — Telephone Encounter (Signed)
Did you want to leave her as routine follow up - recall in for September which would be her yearly check.

## 2019-01-23 NOTE — Telephone Encounter (Signed)
If her symptoms settle down, we could probably keep her regular visit.  If she would rather have a telehealth encounter sooner, I am fine with that as well.

## 2019-01-24 NOTE — Telephone Encounter (Signed)
Patient notified and verbalized understanding. 

## 2019-02-11 DIAGNOSIS — M4727 Other spondylosis with radiculopathy, lumbosacral region: Secondary | ICD-10-CM | POA: Diagnosis not present

## 2019-02-19 ENCOUNTER — Inpatient Hospital Stay (HOSPITAL_COMMUNITY): Payer: Medicare Other | Attending: Hematology

## 2019-02-19 ENCOUNTER — Other Ambulatory Visit: Payer: Self-pay

## 2019-02-19 DIAGNOSIS — E039 Hypothyroidism, unspecified: Secondary | ICD-10-CM | POA: Diagnosis not present

## 2019-02-19 DIAGNOSIS — Z85828 Personal history of other malignant neoplasm of skin: Secondary | ICD-10-CM | POA: Diagnosis not present

## 2019-02-19 DIAGNOSIS — E538 Deficiency of other specified B group vitamins: Secondary | ICD-10-CM | POA: Diagnosis not present

## 2019-02-19 DIAGNOSIS — I10 Essential (primary) hypertension: Secondary | ICD-10-CM | POA: Diagnosis not present

## 2019-02-19 DIAGNOSIS — E785 Hyperlipidemia, unspecified: Secondary | ICD-10-CM | POA: Diagnosis not present

## 2019-02-19 DIAGNOSIS — I2510 Atherosclerotic heart disease of native coronary artery without angina pectoris: Secondary | ICD-10-CM | POA: Insufficient documentation

## 2019-02-19 DIAGNOSIS — F419 Anxiety disorder, unspecified: Secondary | ICD-10-CM | POA: Insufficient documentation

## 2019-02-19 DIAGNOSIS — K219 Gastro-esophageal reflux disease without esophagitis: Secondary | ICD-10-CM | POA: Diagnosis not present

## 2019-02-19 DIAGNOSIS — I252 Old myocardial infarction: Secondary | ICD-10-CM | POA: Diagnosis not present

## 2019-02-19 DIAGNOSIS — Z79899 Other long term (current) drug therapy: Secondary | ICD-10-CM | POA: Insufficient documentation

## 2019-02-19 DIAGNOSIS — E559 Vitamin D deficiency, unspecified: Secondary | ICD-10-CM | POA: Diagnosis not present

## 2019-02-19 DIAGNOSIS — D509 Iron deficiency anemia, unspecified: Secondary | ICD-10-CM | POA: Diagnosis not present

## 2019-02-19 DIAGNOSIS — D508 Other iron deficiency anemias: Secondary | ICD-10-CM

## 2019-02-19 LAB — CBC WITH DIFFERENTIAL/PLATELET
Abs Immature Granulocytes: 0.02 10*3/uL (ref 0.00–0.07)
Basophils Absolute: 0 10*3/uL (ref 0.0–0.1)
Basophils Relative: 0 %
Eosinophils Absolute: 0.1 10*3/uL (ref 0.0–0.5)
Eosinophils Relative: 2 %
HCT: 42.8 % (ref 36.0–46.0)
Hemoglobin: 13.7 g/dL (ref 12.0–15.0)
Immature Granulocytes: 0 %
Lymphocytes Relative: 16 %
Lymphs Abs: 0.9 10*3/uL (ref 0.7–4.0)
MCH: 31.2 pg (ref 26.0–34.0)
MCHC: 32 g/dL (ref 30.0–36.0)
MCV: 97.5 fL (ref 80.0–100.0)
Monocytes Absolute: 0.5 10*3/uL (ref 0.1–1.0)
Monocytes Relative: 9 %
Neutro Abs: 4.2 10*3/uL (ref 1.7–7.7)
Neutrophils Relative %: 73 %
Platelets: 204 10*3/uL (ref 150–400)
RBC: 4.39 MIL/uL (ref 3.87–5.11)
RDW: 13.1 % (ref 11.5–15.5)
WBC: 5.9 10*3/uL (ref 4.0–10.5)
nRBC: 0 % (ref 0.0–0.2)

## 2019-02-19 LAB — COMPREHENSIVE METABOLIC PANEL
ALT: 27 U/L (ref 0–44)
AST: 27 U/L (ref 15–41)
Albumin: 3.9 g/dL (ref 3.5–5.0)
Alkaline Phosphatase: 50 U/L (ref 38–126)
Anion gap: 12 (ref 5–15)
BUN: 24 mg/dL — ABNORMAL HIGH (ref 8–23)
CO2: 26 mmol/L (ref 22–32)
Calcium: 9 mg/dL (ref 8.9–10.3)
Chloride: 100 mmol/L (ref 98–111)
Creatinine, Ser: 0.98 mg/dL (ref 0.44–1.00)
GFR calc Af Amer: >60 mL/min (ref >60)
GFR calc non Af Amer: 54 mL/min — ABNORMAL LOW (ref >60)
Glucose, Bld: 94 mg/dL (ref 70–99)
Potassium: 4.2 mmol/L (ref 3.5–5.1)
Sodium: 138 mmol/L (ref 135–145)
Total Bilirubin: 0.5 mg/dL (ref 0.3–1.2)
Total Protein: 6.6 g/dL (ref 6.5–8.1)

## 2019-02-19 LAB — VITAMIN B12: Vitamin B-12: 173 pg/mL — ABNORMAL LOW (ref 180–914)

## 2019-02-19 LAB — IRON AND TIBC
Iron: 79 ug/dL (ref 28–170)
Saturation Ratios: 24 % (ref 10.4–31.8)
TIBC: 323 ug/dL (ref 250–450)
UIBC: 244 ug/dL

## 2019-02-19 LAB — FOLATE: Folate: 21.4 ng/mL (ref >5.9)

## 2019-02-19 LAB — FERRITIN: Ferritin: 171 ng/mL (ref 11–307)

## 2019-02-20 LAB — VITAMIN D 25 HYDROXY (VIT D DEFICIENCY, FRACTURES): Vit D, 25-Hydroxy: 15.6 ng/mL — ABNORMAL LOW (ref 30.0–100.0)

## 2019-02-21 ENCOUNTER — Inpatient Hospital Stay (HOSPITAL_BASED_OUTPATIENT_CLINIC_OR_DEPARTMENT_OTHER): Payer: Medicare Other | Admitting: Nurse Practitioner

## 2019-02-21 ENCOUNTER — Other Ambulatory Visit: Payer: Self-pay

## 2019-02-21 VITALS — BP 159/97 | HR 60 | Temp 98.1°F | Resp 18 | Wt 126.0 lb

## 2019-02-21 DIAGNOSIS — I252 Old myocardial infarction: Secondary | ICD-10-CM | POA: Diagnosis not present

## 2019-02-21 DIAGNOSIS — E538 Deficiency of other specified B group vitamins: Secondary | ICD-10-CM

## 2019-02-21 DIAGNOSIS — Z79899 Other long term (current) drug therapy: Secondary | ICD-10-CM | POA: Diagnosis not present

## 2019-02-21 DIAGNOSIS — I10 Essential (primary) hypertension: Secondary | ICD-10-CM | POA: Diagnosis not present

## 2019-02-21 DIAGNOSIS — E559 Vitamin D deficiency, unspecified: Secondary | ICD-10-CM

## 2019-02-21 DIAGNOSIS — E039 Hypothyroidism, unspecified: Secondary | ICD-10-CM | POA: Diagnosis not present

## 2019-02-21 DIAGNOSIS — D509 Iron deficiency anemia, unspecified: Secondary | ICD-10-CM

## 2019-02-21 DIAGNOSIS — Z85828 Personal history of other malignant neoplasm of skin: Secondary | ICD-10-CM | POA: Diagnosis not present

## 2019-02-21 DIAGNOSIS — K219 Gastro-esophageal reflux disease without esophagitis: Secondary | ICD-10-CM | POA: Diagnosis not present

## 2019-02-21 DIAGNOSIS — I2510 Atherosclerotic heart disease of native coronary artery without angina pectoris: Secondary | ICD-10-CM | POA: Diagnosis not present

## 2019-02-21 DIAGNOSIS — E785 Hyperlipidemia, unspecified: Secondary | ICD-10-CM | POA: Diagnosis not present

## 2019-02-21 MED ORDER — VITAMIN D 25 MCG (1000 UNIT) PO TABS: 1000.0000 [IU] | ORAL_TABLET | Freq: Every day | ORAL | 1 refills | Status: DC

## 2019-02-21 MED ORDER — CYANOCOBALAMIN 1000 MCG/ML IJ SOLN
1000.0000 ug | Freq: Once | INTRAMUSCULAR | Status: AC
  Administered 2019-02-21: 09:00:00 1000 ug via INTRAMUSCULAR

## 2019-02-21 MED ORDER — CYANOCOBALAMIN 1000 MCG/ML IJ SOLN
INTRAMUSCULAR | Status: AC
  Filled 2019-02-21: qty 1

## 2019-02-21 NOTE — Assessment & Plan Note (Addendum)
1.Iron deficiency anemia: - EGD on 01/31/2017 and it shows web in the proximal esophagus and mild gastritis. - Colonoscopy on 08/03/2015 showed internal hemorrhoids, few scattered pancolonic diverticula with no evidence of inflammatory disease. -She has tried taking oral iron in the past which caused severe constipation.  Also had poor absorption with oral iron therapy. -She last received Feraheme on 10/23/2018. -Labs on 02/19/2019 hemoglobin 13.7, ferritin 171, percent saturation 24, and platelets 204. -She will follow-up in 4 months with repeat labs.  2.  Vitamin B12 deficiency: - She reports taking vitamin B12 medicine with her multivitamin. - Labs on 02/19/2019 showed her vitamin B12 level at 173 - I will give her an injection of vitamin B12 today. - She was told to take vitamin B12 daily. -We will follow-up in 4 months with repeat labs.  3.  Vitamin D deficiency: - Labs on 02/19/2019 showed her vitamin D level at 15.6. - She was only taking 400 UT daily. - I sent her in a prescription for 1000 UT daily. -We will follow-up in 4 months with repeat labs.
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
## 2019-02-21 NOTE — Patient Instructions (Addendum)
[facility] at [facility] Discharge Instructions  Follow up in 4 months with labs Received Vit B12 injection today   Thank you for choosing [place] at [facility] to provide your oncology and hematology care.  To afford each patient quality time with our provider, please arrive at least 15 minutes before your scheduled appointment time.   If you have a lab appointment with the [facility] please come in thru the  Main Entrance and check in at the main information desk  You need to re-schedule your appointment should you arrive 10 or more minutes late.  We strive to give you quality time with our providers, and arriving late affects you and other patients whose appointments are after yours.  Also, if you no show three or more times for appointments you may be dismissed from the clinic at the providers discretion.     Again, thank you for choosing [facility].  Our hope is that these requests will decrease the amount of time that you wait before being seen by our physicians.       _____________________________________________________________  Should you have questions after your visit to [facility], please contact our office at [phone] between the hours of 8:00 a.m. and 4:30 p.m.  Voicemails left after 4:00 p.m. will not be returned until the following business day.  For prescription refill requests, have your pharmacy contact our office and allow 72 hours.    Cancer Center Support Programs:   > Cancer Support Group  2nd Tuesday of the month 1pm-2pm, Journey Room

## 2019-02-21 NOTE — Progress Notes (Signed)
Jonne Ply tolerated Vit B12 injection well without complaints or incident. Pt discharged self ambulatory in satisfactory condition

## 2019-02-24 DIAGNOSIS — M1990 Unspecified osteoarthritis, unspecified site: Secondary | ICD-10-CM | POA: Diagnosis not present

## 2019-02-24 DIAGNOSIS — Z79899 Other long term (current) drug therapy: Secondary | ICD-10-CM | POA: Diagnosis not present

## 2019-02-24 DIAGNOSIS — S81811A Laceration without foreign body, right lower leg, initial encounter: Secondary | ICD-10-CM | POA: Diagnosis not present

## 2019-02-24 DIAGNOSIS — I10 Essential (primary) hypertension: Secondary | ICD-10-CM | POA: Diagnosis not present

## 2019-02-24 DIAGNOSIS — E039 Hypothyroidism, unspecified: Secondary | ICD-10-CM | POA: Diagnosis not present

## 2019-02-24 DIAGNOSIS — X58XXXA Exposure to other specified factors, initial encounter: Secondary | ICD-10-CM | POA: Diagnosis not present

## 2019-03-05 ENCOUNTER — Other Ambulatory Visit: Payer: Self-pay

## 2019-03-05 NOTE — Patient Outreach (Signed)
[name]) Care Management  03/05/2019  Andrea Jackson [birth year] [id number]  TELEPHONE SCREENING Referral date: 02/26/19 Referral source: utilization management Referral reason: community resource assistance.  Insurance: united health care  Attempt #1  Telephone call to patient regarding utilization management  referral. Unable to reach patient. HIPAA compliant voice message left with call back phone number.   PLAN: RNCM will attempt 2nd telephone call to patient within 4 business days. RNCM will send outreach letter to attempt contact.   Quinn Plowman RN, BSN, [facility] Telephonic  [phone]

## 2019-03-08 ENCOUNTER — Other Ambulatory Visit: Payer: Self-pay

## 2019-03-08 NOTE — Patient Outreach (Signed)
[facility]) Care Management  03/08/2019  TIPHANI MELLS 27-Jun-1934 [id number]  TELEPHONE SCREENING Referral date: utilization management Referral reason: community resource assistance Insurance: United health care.   Telephone call to patient regarding utilization management. HIPAA verified with patient. RNCM introduced herself and explained reason for call. Patient states she is on a fixed income, lives alone and had a decrease in her food stamps in the fall of 2019 from $131 to $105.  Patient states she is able to pay her bills but does not have much money left over to get paper products, cleaning supplies, etc. RNCM discussed and offered [facility] care management follow up with social worker to assist with community resources. Patient states she was sent a list of churches she can go to for supplies. Patient states, "I'm no a begger."  Patient states she can continue to handle things on her own and declines referral to Education officer, museum. Patient reports she has had a heart attack in 2010. She states, " my heart is doing pretty good based on what the doctor says." She states she sees her cardiologist yearly.  Patient states she was outside a couple of weeks ago and something hit her right inner ankle area. She states she is unsure what hit her ankle based on where she was located in her yard area.  Patient reports she was seen in the emergency room and given antibiotics. Patient states the antibiotic were prescribed 1 tablet 3 times per day. Patient states she was not able to take the medication 3 times per day because it upset her stomach. She reports she has taken the antibiotic 1 time per day.  Patient states the area to her leg has not healed. She reports the area still has some redness, is sore to touch and has a night so white small amount of drainage if you press on it. Patient denies the wound are is weeping. Patient states she has soaked her ankle in epson salt and washes the area a few  times per day and put a dressing on it.  Patient states she is scheduled to have blood drawn at her primary MD office on March 18, 2019 and follow up with the doctor for her physical on March 22, 2019.  RNCM advised patient to call her doctors office today to explain her wound situation and request an earlier appointment.  Patient verbalized understanding.  RNCM offered to follow up with patient within 1 week to check on her status. Patient verbally agreed. RNCM offered to send patient [facility] care management brochure/ magnet. Patient verbally agreed.  RNCM advised patient to notify MD of any changes in condition prior to scheduled appointment. RNCM provided contact name and number: [phone] or main office number [phone] and 24 hour nurse advise line [phone] by mail.  RNCM verified patient aware of 911 services for urgent/ emergent needs. RNCM discussed COVID 19 precautions and symptoms.  Advised patient to contact her doctor for minor symptoms. If symptoms more severe call 911.  Patient verbalized understanding.   PLAN: RNCM will follow up with patient within 1 week.   Quinn Plowman RN,BSN,CCM [facility] Telephonic  [phone]

## 2019-03-14 ENCOUNTER — Other Ambulatory Visit: Payer: Self-pay

## 2019-03-14 NOTE — Patient Outreach (Signed)
[facility]) Care Management  03/14/2019  Andrea Jackson 1933/10/18 [id number]  TELEPHONE SCREENING Referral date: utilization management Referral reason: community resource assistance Insurance: United health care.   Telephone call to patient regarding utilization management referral. HIPAA verified with patient. Patient states she is doing fine. She reports the wound to her leg has healed. She states the area is not weeping anymore and has scabbed over.  Patient denies any further needs a t this time. RNCM informed patient she will receive a [facility] care management brochure/ magnet.  Information mailed to patient on 03/08/19  PLAN; RNCM will close patient due to patient being assessed and having no further needs.   Quinn Plowman RN,BSN,CCM [facility] Telephonic  [phone]

## 2019-04-08 DIAGNOSIS — M5136 Other intervertebral disc degeneration, lumbar region: Secondary | ICD-10-CM | POA: Diagnosis not present

## 2019-04-12 DIAGNOSIS — I10 Essential (primary) hypertension: Secondary | ICD-10-CM | POA: Diagnosis not present

## 2019-04-12 DIAGNOSIS — E785 Hyperlipidemia, unspecified: Secondary | ICD-10-CM | POA: Diagnosis not present

## 2019-04-18 ENCOUNTER — Other Ambulatory Visit (HOSPITAL_COMMUNITY): Payer: Self-pay | Admitting: Nurse Practitioner

## 2019-04-18 DIAGNOSIS — E559 Vitamin D deficiency, unspecified: Secondary | ICD-10-CM

## 2019-05-02 DIAGNOSIS — N183 Chronic kidney disease, stage 3 (moderate): Secondary | ICD-10-CM | POA: Diagnosis not present

## 2019-05-02 DIAGNOSIS — D649 Anemia, unspecified: Secondary | ICD-10-CM | POA: Diagnosis not present

## 2019-05-02 DIAGNOSIS — K210 Gastro-esophageal reflux disease with esophagitis: Secondary | ICD-10-CM | POA: Diagnosis not present

## 2019-05-02 DIAGNOSIS — I10 Essential (primary) hypertension: Secondary | ICD-10-CM | POA: Diagnosis not present

## 2019-05-03 DIAGNOSIS — M5137 Other intervertebral disc degeneration, lumbosacral region: Secondary | ICD-10-CM | POA: Diagnosis not present

## 2019-05-07 DIAGNOSIS — I700 Atherosclerosis of aorta: Secondary | ICD-10-CM | POA: Diagnosis not present

## 2019-05-07 DIAGNOSIS — Z1389 Encounter for screening for other disorder: Secondary | ICD-10-CM | POA: Diagnosis not present

## 2019-05-07 DIAGNOSIS — E782 Mixed hyperlipidemia: Secondary | ICD-10-CM | POA: Diagnosis not present

## 2019-05-07 DIAGNOSIS — I10 Essential (primary) hypertension: Secondary | ICD-10-CM | POA: Diagnosis not present

## 2019-05-07 DIAGNOSIS — I2510 Atherosclerotic heart disease of native coronary artery without angina pectoris: Secondary | ICD-10-CM | POA: Diagnosis not present

## 2019-05-14 DIAGNOSIS — M810 Age-related osteoporosis without current pathological fracture: Secondary | ICD-10-CM | POA: Diagnosis not present

## 2019-05-27 DIAGNOSIS — Z23 Encounter for immunization: Secondary | ICD-10-CM | POA: Diagnosis not present

## 2019-05-31 DIAGNOSIS — M47816 Spondylosis without myelopathy or radiculopathy, lumbar region: Secondary | ICD-10-CM | POA: Diagnosis not present

## 2019-06-18 ENCOUNTER — Other Ambulatory Visit: Payer: Self-pay

## 2019-06-18 ENCOUNTER — Inpatient Hospital Stay (HOSPITAL_COMMUNITY): Payer: Medicare Other | Attending: Hematology

## 2019-06-18 DIAGNOSIS — I2510 Atherosclerotic heart disease of native coronary artery without angina pectoris: Secondary | ICD-10-CM | POA: Insufficient documentation

## 2019-06-18 DIAGNOSIS — E039 Hypothyroidism, unspecified: Secondary | ICD-10-CM | POA: Diagnosis not present

## 2019-06-18 DIAGNOSIS — E538 Deficiency of other specified B group vitamins: Secondary | ICD-10-CM | POA: Diagnosis not present

## 2019-06-18 DIAGNOSIS — D509 Iron deficiency anemia, unspecified: Secondary | ICD-10-CM | POA: Diagnosis not present

## 2019-06-18 DIAGNOSIS — E559 Vitamin D deficiency, unspecified: Secondary | ICD-10-CM | POA: Insufficient documentation

## 2019-06-18 DIAGNOSIS — Z79899 Other long term (current) drug therapy: Secondary | ICD-10-CM | POA: Insufficient documentation

## 2019-06-18 LAB — CBC WITH DIFFERENTIAL/PLATELET
Abs Immature Granulocytes: 0.02 10*3/uL (ref 0.00–0.07)
Basophils Absolute: 0 10*3/uL (ref 0.0–0.1)
Basophils Relative: 1 %
Eosinophils Absolute: 0.1 10*3/uL (ref 0.0–0.5)
Eosinophils Relative: 2 %
HCT: 45.7 % (ref 36.0–46.0)
Hemoglobin: 14 g/dL (ref 12.0–15.0)
Immature Granulocytes: 0 %
Lymphocytes Relative: 20 %
Lymphs Abs: 1.2 10*3/uL (ref 0.7–4.0)
MCH: 29.2 pg (ref 26.0–34.0)
MCHC: 30.6 g/dL (ref 30.0–36.0)
MCV: 95.4 fL (ref 80.0–100.0)
Monocytes Absolute: 0.5 10*3/uL (ref 0.1–1.0)
Monocytes Relative: 8 %
Neutro Abs: 4.1 10*3/uL (ref 1.7–7.7)
Neutrophils Relative %: 69 %
Platelets: 217 10*3/uL (ref 150–400)
RBC: 4.79 MIL/uL (ref 3.87–5.11)
RDW: 13.5 % (ref 11.5–15.5)
WBC: 5.9 10*3/uL (ref 4.0–10.5)
nRBC: 0 % (ref 0.0–0.2)

## 2019-06-18 LAB — COMPREHENSIVE METABOLIC PANEL
ALT: 19 U/L (ref 0–44)
AST: 24 U/L (ref 15–41)
Albumin: 4 g/dL (ref 3.5–5.0)
Alkaline Phosphatase: 51 U/L (ref 38–126)
Anion gap: 9 (ref 5–15)
BUN: 28 mg/dL — ABNORMAL HIGH (ref 8–23)
CO2: 26 mmol/L (ref 22–32)
Calcium: 9 mg/dL (ref 8.9–10.3)
Chloride: 102 mmol/L (ref 98–111)
Creatinine, Ser: 1.06 mg/dL — ABNORMAL HIGH (ref 0.44–1.00)
GFR calc Af Amer: 56 mL/min — ABNORMAL LOW (ref >60)
GFR calc non Af Amer: 49 mL/min — ABNORMAL LOW (ref >60)
Glucose, Bld: 110 mg/dL — ABNORMAL HIGH (ref 70–99)
Potassium: 4.4 mmol/L (ref 3.5–5.1)
Sodium: 137 mmol/L (ref 135–145)
Total Bilirubin: 0.5 mg/dL (ref 0.3–1.2)
Total Protein: 6.7 g/dL (ref 6.5–8.1)

## 2019-06-18 LAB — VITAMIN D 25 HYDROXY (VIT D DEFICIENCY, FRACTURES): Vit D, 25-Hydroxy: 13.88 ng/mL — ABNORMAL LOW (ref 30–100)

## 2019-06-18 LAB — LACTATE DEHYDROGENASE: LDH: 187 U/L (ref 98–192)

## 2019-06-18 LAB — FERRITIN: Ferritin: 116 ng/mL (ref 11–307)

## 2019-06-18 LAB — IRON AND TIBC
Iron: 56 ug/dL (ref 28–170)
Saturation Ratios: 17 % (ref 10.4–31.8)
TIBC: 335 ug/dL (ref 250–450)
UIBC: 279 ug/dL

## 2019-06-18 LAB — FOLATE: Folate: 13.8 ng/mL (ref >5.9)

## 2019-06-18 LAB — VITAMIN B12: Vitamin B-12: 421 pg/mL (ref 180–914)

## 2019-06-20 DIAGNOSIS — M47816 Spondylosis without myelopathy or radiculopathy, lumbar region: Secondary | ICD-10-CM | POA: Diagnosis not present

## 2019-06-25 ENCOUNTER — Other Ambulatory Visit: Payer: Self-pay

## 2019-06-25 ENCOUNTER — Inpatient Hospital Stay (HOSPITAL_BASED_OUTPATIENT_CLINIC_OR_DEPARTMENT_OTHER): Payer: Medicare Other | Admitting: Nurse Practitioner

## 2019-06-25 VITALS — BP 132/78 | HR 67 | Temp 97.7°F | Resp 14 | Wt 123.3 lb

## 2019-06-25 DIAGNOSIS — E559 Vitamin D deficiency, unspecified: Secondary | ICD-10-CM | POA: Diagnosis not present

## 2019-06-25 DIAGNOSIS — D509 Iron deficiency anemia, unspecified: Secondary | ICD-10-CM | POA: Diagnosis not present

## 2019-06-25 DIAGNOSIS — E039 Hypothyroidism, unspecified: Secondary | ICD-10-CM | POA: Diagnosis not present

## 2019-06-25 DIAGNOSIS — Z79899 Other long term (current) drug therapy: Secondary | ICD-10-CM | POA: Diagnosis not present

## 2019-06-25 DIAGNOSIS — I2510 Atherosclerotic heart disease of native coronary artery without angina pectoris: Secondary | ICD-10-CM | POA: Diagnosis not present

## 2019-06-25 DIAGNOSIS — E538 Deficiency of other specified B group vitamins: Secondary | ICD-10-CM | POA: Diagnosis not present

## 2019-06-25 MED ORDER — ERGOCALCIFEROL 1.25 MG (50000 UT) PO CAPS: 50000.0000 [IU] | ORAL_CAPSULE | ORAL | 3 refills | Status: DC
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
## 2019-06-25 NOTE — Assessment & Plan Note (Addendum)
1.Iron deficiency anemia: - EGD on 01/31/2017 and it shows web in the proximal esophagus and mild gastritis. - Colonoscopy on 08/03/2015 showed internal hemorrhoids, few scattered pancolonic diverticula with no evidence of inflammatory disease. -She has tried taking oral iron in the past which caused severe constipation.  Also had poor absorption with oral iron therapy. -She last received Feraheme on 10/23/2018. -Labs on 06/18/2019 showed hemoglobin 14.0, ferritin 116, percent saturation 17, and platelets 217. -She will follow-up in 4 months with repeat labs.  2.  Vitamin B12 deficiency: - She reports taking vitamin B12 medicine with her multivitamin. - Labs on 06/18/2019 showed her vitamin B12 level at 421 - She is now taking vitamin D daily. -We will follow-up in 4 months with repeat labs.  3.  Vitamin D deficiency: - Labs on 06/18/2019 showed her vitamin D level at 13.88 - She was only taking 400 UT daily.  Last visit she was given a prescription for 1000 UT daily. -Her levels have decreased even further.  We will send her in a new prescription for 50,000 units weekly. -We will follow-up in 4 months with repeat labs.

## 2019-06-25 NOTE — Patient Instructions (Signed)
[facility] at [facility] Discharge Instructions  Follow up in 4 months with labs    Thank you for choosing [facility] at [facility] to provide your oncology and hematology care.  To afford each patient quality time with our provider, please arrive at least 15 minutes before your scheduled appointment time.   If you have a lab appointment with the Cancer Center please come in thru the Main Entrance and check in at the main information desk.  You need to re-schedule your appointment should you arrive 10 or more minutes late.  We strive to give you quality time with our providers, and arriving late affects you and other patients whose appointments are after yours.  Also, if you no show three or more times for appointments you may be dismissed from the clinic at the providers discretion.     Again, thank you for choosing [facility].  Our hope is that these requests will decrease the amount of time that you wait before being seen by our physicians.       _____________________________________________________________  Should you have questions after your visit to [facility], please contact our office at [phone] between the hours of 8:00 a.m. and 4:30 p.m.  Voicemails left after 4:00 p.m. will not be returned until the following business day.  For prescription refill requests, have your pharmacy contact our office and allow 72 hours.    Due to Covid, you will need to wear a mask upon entering the hospital. If you do not have a mask, a mask will be given to you at the Main Entrance upon arrival. For doctor visits, patients may have 1 support person with them. For treatment visits, patients can not have anyone with them due to social distancing guidelines and our immunocompromised population.      

## 2019-07-04 ENCOUNTER — Telehealth: Payer: Self-pay | Admitting: Cardiology

## 2019-07-04 NOTE — Telephone Encounter (Signed)
Virtual Visit Pre-Appointment Phone Call  "(Name), I am calling you today to discuss your upcoming appointment. We are currently trying to limit exposure to the virus that causes COVID-19 by seeing patients at home rather than in the office."  1. "What is the BEST phone number to call the day of the visit?" - include this in appointment notes  2. Do you have or have access to (through a family member/friend) a smartphone with video capability that we can use for your visit?" a. If yes - list this number in appt notes as cell (if different from BEST phone #) and list the appointment type as a VIDEO visit in appointment notes b. If no - list the appointment type as a PHONE visit in appointment notes  3. Confirm consent - "In the setting of the current Covid19 crisis, you are scheduled for a (phone or video) visit with your provider on (date) at (time).  Just as we do with many in-office visits, in order for you to participate in this visit, we must obtain consent.  If you'd like, I can send this to your mychart (if signed up) or email for you to review.  Otherwise, I can obtain your verbal consent now.  All virtual visits are billed to your insurance company just like a normal visit would be.  By agreeing to a virtual visit, we'd like you to understand that the technology does not allow for your provider to perform an examination, and thus may limit your provider's ability to fully assess your condition. If your provider identifies any concerns that need to be evaluated in person, we will make arrangements to do so.  Finally, though the technology is pretty good, we cannot assure that it will always work on either your or our end, and in the setting of a video visit, we may have to convert it to a phone-only visit.  In either situation, we cannot ensure that we have a secure connection.  Are you willing to proceed?" STAFF: Did the patient verbally acknowledge consent to telehealth visit? Document  YES/NO here: YES  4. Advise patient to be prepared - "Two hours prior to your appointment, go ahead and check your blood pressure, pulse, oxygen saturation, and your weight (if you have the equipment to check those) and write them all down. When your visit starts, your provider will ask you for this information. If you have an Apple Watch or Kardia device, please plan to have heart rate information ready on the day of your appointment. Please have a pen and paper handy nearby the day of the visit as well."  5. Give patient instructions for MyChart download to smartphone OR Doximity/[email or web address] as below if video visit (depending on what platform provider is using)  6. Inform patient they will receive a phone call 15 minutes prior to their appointment time (may be from unknown caller ID) so they should be prepared to answer    Andrea Jackson has been deemed a candidate for a follow-up tele-health visit to limit community exposure during the Covid-19 pandemic. I spoke with the patient via phone to ensure availability of phone/video source, confirm preferred email & phone number, and discuss instructions and expectations.  I reminded Andrea Jackson to be prepared with any vital sign and/or heart rhythm information that could potentially be obtained via home monitoring, at the time of Andrea Jackson visit. I reminded Andrea Jackson to expect a phone call prior to  Andrea Jackson visit.  Andrea Jackson 07/04/2019 2:44 PM   INSTRUCTIONS FOR DOWNLOADING THE MYCHART APP TO SMARTPHONE  - The patient must first make sure to have activated MyChart and know their login information - If Apple, go to [employer] and type in MyChart in the search bar and download the app. If Android, ask patient to go to [employer] and type in [place] in the search bar and download the app. The app is free but as with any other app downloads, their phone may require them to verify saved payment information or Apple/Android  password.  - The patient will need to then log into the app with their MyChart username and password, and select [facility] as their healthcare provider to link the account. When it is time for your visit, go to the MyChart app, find appointments, and click Begin Video Visit. Be sure to Select Allow for your device to access the Microphone and Camera for your visit. You will then be connected, and your provider will be with you shortly.  **If they have any issues connecting, or need assistance please contact MyChart service desk [phone])**  **If using a computer, in order to ensure the best quality for their visit they will need to use either of the following Internet Browsers: [employer], or Google Chrome**  IF USING DOXIMITY or DOXY.ME - The patient will receive a link just prior to their visit by text.     FULL LENGTH CONSENT FOR TELE-HEALTH VISIT   I hereby voluntarily request, consent and authorize [facility] and its employed or contracted physicians, physician assistants, nurse practitioners or other licensed health care professionals (the Practitioner), to provide me with telemedicine health care services (the Services") as deemed necessary by the treating Practitioner. I acknowledge and consent to receive the Services by the Practitioner via telemedicine. I understand that the telemedicine visit will involve communicating with the Practitioner through live audiovisual communication technology and the disclosure of certain medical information by electronic transmission. I acknowledge that I have been given the opportunity to request an in-person assessment or other available alternative prior to the telemedicine visit and am voluntarily participating in the telemedicine visit.  I understand that I have the right to withhold or withdraw my consent to the use of telemedicine in the course of my care at any time, without affecting my right to future care or treatment,  and that the Practitioner or I may terminate the telemedicine visit at any time. I understand that I have the right to inspect all information obtained and/or recorded in the course of the telemedicine visit and may receive copies of available information for a reasonable fee.  I understand that some of the potential risks of receiving the Services via telemedicine include:   Delay or interruption in medical evaluation due to technological equipment failure or disruption;  Information transmitted may not be sufficient (e.g. poor resolution of images) to allow for appropriate medical decision making by the Practitioner; and/or   In rare instances, security protocols could fail, causing a breach of personal health information.  Furthermore, I acknowledge that it is my responsibility to provide information about my medical history, conditions and care that is complete and accurate to the best of my ability. I acknowledge that Practitioner's advice, recommendations, and/or decision may be based on factors not within their control, such as incomplete or inaccurate data provided by me or distortions of diagnostic images or specimens that may result from electronic transmissions. I  understand that the practice of medicine is not an exact science and that Practitioner makes no warranties or guarantees regarding treatment outcomes. I acknowledge that I will receive a copy of this consent concurrently upon execution via email to the email address I last provided but may also request a printed copy by calling the office of [facility].    I understand that my insurance will be billed for this visit.   I have read or had this consent read to me.  I understand the contents of this consent, which adequately explains the benefits and risks of the Services being provided via telemedicine.   I have been provided ample opportunity to ask questions regarding this consent and the Services and have had my questions  answered to my satisfaction.  I give my informed consent for the services to be provided through the use of telemedicine in my medical care  By participating in this telemedicine visit I agree to the above.

## 2019-07-10 ENCOUNTER — Telehealth (INDEPENDENT_AMBULATORY_CARE_PROVIDER_SITE_OTHER): Payer: Medicare Other | Admitting: Cardiology

## 2019-07-10 ENCOUNTER — Encounter: Payer: Self-pay | Admitting: Cardiology

## 2019-07-10 VITALS — BP 128/68 | Ht 64.0 in | Wt 124.0 lb

## 2019-07-10 DIAGNOSIS — E782 Mixed hyperlipidemia: Secondary | ICD-10-CM

## 2019-07-10 DIAGNOSIS — I25119 Atherosclerotic heart disease of native coronary artery with unspecified angina pectoris: Secondary | ICD-10-CM

## 2019-07-10 DIAGNOSIS — I10 Essential (primary) hypertension: Secondary | ICD-10-CM | POA: Diagnosis not present

## 2019-07-10 NOTE — Patient Instructions (Addendum)
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
## 2019-08-12 DIAGNOSIS — E782 Mixed hyperlipidemia: Secondary | ICD-10-CM | POA: Diagnosis not present

## 2019-08-12 DIAGNOSIS — I10 Essential (primary) hypertension: Secondary | ICD-10-CM | POA: Diagnosis not present

## 2019-08-20 DIAGNOSIS — M75112 Incomplete rotator cuff tear or rupture of left shoulder, not specified as traumatic: Secondary | ICD-10-CM | POA: Diagnosis not present

## 2019-08-20 DIAGNOSIS — M75111 Incomplete rotator cuff tear or rupture of right shoulder, not specified as traumatic: Secondary | ICD-10-CM | POA: Diagnosis not present

## 2019-09-11 DIAGNOSIS — K219 Gastro-esophageal reflux disease without esophagitis: Secondary | ICD-10-CM | POA: Diagnosis not present

## 2019-09-11 DIAGNOSIS — E039 Hypothyroidism, unspecified: Secondary | ICD-10-CM | POA: Diagnosis not present

## 2019-09-11 DIAGNOSIS — E782 Mixed hyperlipidemia: Secondary | ICD-10-CM | POA: Diagnosis not present

## 2019-09-11 DIAGNOSIS — E7800 Pure hypercholesterolemia, unspecified: Secondary | ICD-10-CM | POA: Diagnosis not present

## 2019-09-11 DIAGNOSIS — I10 Essential (primary) hypertension: Secondary | ICD-10-CM | POA: Diagnosis not present

## 2019-09-18 DIAGNOSIS — K219 Gastro-esophageal reflux disease without esophagitis: Secondary | ICD-10-CM | POA: Diagnosis not present

## 2019-09-18 DIAGNOSIS — I10 Essential (primary) hypertension: Secondary | ICD-10-CM | POA: Diagnosis not present

## 2019-09-18 DIAGNOSIS — R4582 Worries: Secondary | ICD-10-CM | POA: Diagnosis not present

## 2019-09-18 DIAGNOSIS — E441 Mild protein-calorie malnutrition: Secondary | ICD-10-CM | POA: Diagnosis not present

## 2019-09-18 DIAGNOSIS — I2510 Atherosclerotic heart disease of native coronary artery without angina pectoris: Secondary | ICD-10-CM | POA: Diagnosis not present

## 2019-09-18 DIAGNOSIS — Z0001 Encounter for general adult medical examination with abnormal findings: Secondary | ICD-10-CM | POA: Diagnosis not present

## 2019-09-18 DIAGNOSIS — Z23 Encounter for immunization: Secondary | ICD-10-CM | POA: Diagnosis not present

## 2019-09-18 DIAGNOSIS — E782 Mixed hyperlipidemia: Secondary | ICD-10-CM | POA: Diagnosis not present

## 2019-09-18 DIAGNOSIS — E039 Hypothyroidism, unspecified: Secondary | ICD-10-CM | POA: Diagnosis not present

## 2019-09-25 DIAGNOSIS — M810 Age-related osteoporosis without current pathological fracture: Secondary | ICD-10-CM | POA: Diagnosis not present

## 2019-10-11 DIAGNOSIS — I10 Essential (primary) hypertension: Secondary | ICD-10-CM | POA: Diagnosis not present

## 2019-10-11 DIAGNOSIS — E7849 Other hyperlipidemia: Secondary | ICD-10-CM | POA: Diagnosis not present

## 2019-10-14 ENCOUNTER — Other Ambulatory Visit: Payer: Self-pay | Admitting: Cardiology

## 2019-10-22 ENCOUNTER — Inpatient Hospital Stay (HOSPITAL_COMMUNITY): Payer: Medicare Other | Attending: Hematology

## 2019-10-22 ENCOUNTER — Other Ambulatory Visit: Payer: Self-pay

## 2019-10-22 DIAGNOSIS — E538 Deficiency of other specified B group vitamins: Secondary | ICD-10-CM | POA: Diagnosis not present

## 2019-10-22 DIAGNOSIS — E559 Vitamin D deficiency, unspecified: Secondary | ICD-10-CM | POA: Insufficient documentation

## 2019-10-22 DIAGNOSIS — Z79899 Other long term (current) drug therapy: Secondary | ICD-10-CM | POA: Diagnosis not present

## 2019-10-22 DIAGNOSIS — E039 Hypothyroidism, unspecified: Secondary | ICD-10-CM | POA: Insufficient documentation

## 2019-10-22 DIAGNOSIS — I2510 Atherosclerotic heart disease of native coronary artery without angina pectoris: Secondary | ICD-10-CM | POA: Insufficient documentation

## 2019-10-22 DIAGNOSIS — D509 Iron deficiency anemia, unspecified: Secondary | ICD-10-CM | POA: Diagnosis not present

## 2019-10-22 LAB — CBC WITH DIFFERENTIAL/PLATELET
Abs Immature Granulocytes: 0.01 10*3/uL (ref 0.00–0.07)
Basophils Absolute: 0 10*3/uL (ref 0.0–0.1)
Basophils Relative: 1 %
Eosinophils Absolute: 0.2 10*3/uL (ref 0.0–0.5)
Eosinophils Relative: 3 %
HCT: 42.8 % (ref 36.0–46.0)
Hemoglobin: 13.3 g/dL (ref 12.0–15.0)
Immature Granulocytes: 0 %
Lymphocytes Relative: 18 %
Lymphs Abs: 0.9 10*3/uL (ref 0.7–4.0)
MCH: 30.5 pg (ref 26.0–34.0)
MCHC: 31.1 g/dL (ref 30.0–36.0)
MCV: 98.2 fL (ref 80.0–100.0)
Monocytes Absolute: 0.5 10*3/uL (ref 0.1–1.0)
Monocytes Relative: 10 %
Neutro Abs: 3.5 10*3/uL (ref 1.7–7.7)
Neutrophils Relative %: 68 %
Platelets: 237 10*3/uL (ref 150–400)
RBC: 4.36 MIL/uL (ref 3.87–5.11)
RDW: 13.8 % (ref 11.5–15.5)
WBC: 5.2 10*3/uL (ref 4.0–10.5)
nRBC: 0 % (ref 0.0–0.2)

## 2019-10-22 LAB — IRON AND TIBC
Iron: 91 ug/dL (ref 28–170)
Saturation Ratios: 27 % (ref 10.4–31.8)
TIBC: 337 ug/dL (ref 250–450)
UIBC: 246 ug/dL

## 2019-10-22 LAB — COMPREHENSIVE METABOLIC PANEL
ALT: 30 U/L (ref 0–44)
AST: 27 U/L (ref 15–41)
Albumin: 3.7 g/dL (ref 3.5–5.0)
Alkaline Phosphatase: 42 U/L (ref 38–126)
Anion gap: 7 (ref 5–15)
BUN: 24 mg/dL — ABNORMAL HIGH (ref 8–23)
CO2: 28 mmol/L (ref 22–32)
Calcium: 8.7 mg/dL — ABNORMAL LOW (ref 8.9–10.3)
Chloride: 103 mmol/L (ref 98–111)
Creatinine, Ser: 0.91 mg/dL (ref 0.44–1.00)
GFR calc Af Amer: >60 mL/min (ref >60)
GFR calc non Af Amer: 58 mL/min — ABNORMAL LOW (ref >60)
Glucose, Bld: 100 mg/dL — ABNORMAL HIGH (ref 70–99)
Potassium: 4.5 mmol/L (ref 3.5–5.1)
Sodium: 138 mmol/L (ref 135–145)
Total Bilirubin: 0.7 mg/dL (ref 0.3–1.2)
Total Protein: 6.2 g/dL — ABNORMAL LOW (ref 6.5–8.1)

## 2019-10-22 LAB — LACTATE DEHYDROGENASE: LDH: 193 U/L — ABNORMAL HIGH (ref 98–192)

## 2019-10-22 LAB — FERRITIN: Ferritin: 103 ng/mL (ref 11–307)

## 2019-10-22 LAB — FOLATE: Folate: 12.8 ng/mL (ref >5.9)

## 2019-10-22 LAB — VITAMIN D 25 HYDROXY (VIT D DEFICIENCY, FRACTURES): Vit D, 25-Hydroxy: 31.93 ng/mL (ref 30–100)

## 2019-10-22 LAB — VITAMIN B12: Vitamin B-12: 974 pg/mL — ABNORMAL HIGH (ref 180–914)

## 2019-10-29 ENCOUNTER — Inpatient Hospital Stay (HOSPITAL_BASED_OUTPATIENT_CLINIC_OR_DEPARTMENT_OTHER): Payer: Medicare Other | Admitting: Nurse Practitioner

## 2019-10-29 ENCOUNTER — Other Ambulatory Visit: Payer: Self-pay

## 2019-10-29 ENCOUNTER — Ambulatory Visit (HOSPITAL_COMMUNITY): Payer: Medicare Other | Admitting: Hematology

## 2019-10-29 ENCOUNTER — Other Ambulatory Visit (HOSPITAL_COMMUNITY): Payer: Self-pay | Admitting: Nurse Practitioner

## 2019-10-29 DIAGNOSIS — E039 Hypothyroidism, unspecified: Secondary | ICD-10-CM | POA: Diagnosis not present

## 2019-10-29 DIAGNOSIS — D509 Iron deficiency anemia, unspecified: Secondary | ICD-10-CM

## 2019-10-29 DIAGNOSIS — Z79899 Other long term (current) drug therapy: Secondary | ICD-10-CM | POA: Diagnosis not present

## 2019-10-29 DIAGNOSIS — I2510 Atherosclerotic heart disease of native coronary artery without angina pectoris: Secondary | ICD-10-CM | POA: Diagnosis not present

## 2019-10-29 DIAGNOSIS — E559 Vitamin D deficiency, unspecified: Secondary | ICD-10-CM

## 2019-10-29 DIAGNOSIS — E538 Deficiency of other specified B group vitamins: Secondary | ICD-10-CM | POA: Diagnosis not present

## 2019-10-29 MED ORDER — ERGOCALCIFEROL 1.25 MG (50000 UT) PO CAPS: 50000.0000 [IU] | ORAL_CAPSULE | ORAL | 3 refills | Status: DC

## 2019-10-29 NOTE — Patient Instructions (Signed)
[facility] at [facility] Discharge Instructions  Follow up in 6 months with labs    Thank you for choosing  [facility] at [facility] to provide your oncology and hematology care.  To afford each patient quality time with our provider, please arrive at least 15 minutes before your scheduled appointment time.   If you have a lab appointment with the Cancer Center please come in thru the Main Entrance and check in at the main information desk.  You need to re-schedule your appointment should you arrive 10 or more minutes late.  We strive to give you quality time with our providers, and arriving late affects you and other patients whose appointments are after yours.  Also, if you no show three or more times for appointments you may be dismissed from the clinic at the providers discretion.     Again, thank you for choosing [facility].  Our hope is that these requests will decrease the amount of time that you wait before being seen by our physicians.       _____________________________________________________________  Should you have questions after your visit to [facility], please contact our office at [phone] between the hours of 8:00 a.m. and 4:30 p.m.  Voicemails left after 4:00 p.m. will not be returned until the following business day.  For prescription refill requests, have your pharmacy contact our office and allow 72 hours.    Due to Covid, you will need to wear a mask upon entering the hospital. If you do not have a mask, a mask will be given to you at the Main Entrance upon arrival. For doctor visits, patients may have 1 support person with them. For treatment visits, patients can not have anyone with them due to social distancing guidelines and our immunocompromised population.      

## 2019-10-29 NOTE — Assessment & Plan Note (Addendum)
1.Iron deficiency anemia: - EGD on 01/31/2017 and it shows web in the proximal esophagus and mild gastritis. - Colonoscopy on 08/03/2015 showed internal hemorrhoids, few scattered pancolonic diverticula with no evidence of inflammatory disease. -She has tried taking oral iron in the past which caused severe constipation.  Also had poor absorption with oral iron therapy. -She last received Feraheme on 10/23/2018. -She denies any bright red bleeding per rectum or melena.   -Labs on 10/22/2019 showed hemoglobin 13.7, ferritin 103, percent saturation 27, and platelets 237. -She will follow-up in 6 months with repeat labs.  2.  Vitamin B12 deficiency: - She reports taking vitamin B12 medicine with her multivitamin. - Labs on 10/22/2019 showed her vitamin B12 level at 974 - She continues to vitamin B12 daily -We will follow-up in 6 months with repeat labs.  3.  Vitamin D deficiency: - Labs on 06/18/2019 showed her vitamin D level at 13.88 - She was only taking 400 UT daily.  Last visit she was given a prescription for 1000 UT daily. -Her levels have decreased even further.  We will send her in a new prescription for 50,000 units weekly. -Labs done on 10/22/2019 showed her vitamin D level has improved to 31.93. -She will continue the 50,000 units weekly. -We will follow-up in 6 months with repeat labs.
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
## 2019-11-11 DIAGNOSIS — H47312 Coloboma of optic disc, left eye: Secondary | ICD-10-CM | POA: Diagnosis not present

## 2019-12-27 DIAGNOSIS — R05 Cough: Secondary | ICD-10-CM | POA: Diagnosis not present

## 2019-12-27 DIAGNOSIS — R0602 Shortness of breath: Secondary | ICD-10-CM | POA: Diagnosis not present

## 2019-12-27 DIAGNOSIS — J441 Chronic obstructive pulmonary disease with (acute) exacerbation: Secondary | ICD-10-CM | POA: Diagnosis not present

## 2019-12-28 DIAGNOSIS — R0602 Shortness of breath: Secondary | ICD-10-CM | POA: Diagnosis not present

## 2019-12-28 DIAGNOSIS — R05 Cough: Secondary | ICD-10-CM | POA: Diagnosis not present

## 2019-12-28 DIAGNOSIS — J441 Chronic obstructive pulmonary disease with (acute) exacerbation: Secondary | ICD-10-CM | POA: Diagnosis not present

## 2019-12-28 DIAGNOSIS — R1110 Vomiting, unspecified: Secondary | ICD-10-CM | POA: Diagnosis not present

## 2020-01-10 DIAGNOSIS — E7849 Other hyperlipidemia: Secondary | ICD-10-CM | POA: Diagnosis not present

## 2020-01-10 DIAGNOSIS — I10 Essential (primary) hypertension: Secondary | ICD-10-CM | POA: Diagnosis not present

## 2020-01-14 DIAGNOSIS — E7800 Pure hypercholesterolemia, unspecified: Secondary | ICD-10-CM | POA: Diagnosis not present

## 2020-01-14 DIAGNOSIS — K219 Gastro-esophageal reflux disease without esophagitis: Secondary | ICD-10-CM | POA: Diagnosis not present

## 2020-01-14 DIAGNOSIS — E782 Mixed hyperlipidemia: Secondary | ICD-10-CM | POA: Diagnosis not present

## 2020-01-14 DIAGNOSIS — N183 Chronic kidney disease, stage 3 unspecified: Secondary | ICD-10-CM | POA: Diagnosis not present

## 2020-01-14 DIAGNOSIS — I10 Essential (primary) hypertension: Secondary | ICD-10-CM | POA: Diagnosis not present

## 2020-01-21 DIAGNOSIS — E782 Mixed hyperlipidemia: Secondary | ICD-10-CM | POA: Diagnosis not present

## 2020-01-21 DIAGNOSIS — I10 Essential (primary) hypertension: Secondary | ICD-10-CM | POA: Diagnosis not present

## 2020-01-21 DIAGNOSIS — R4582 Worries: Secondary | ICD-10-CM | POA: Diagnosis not present

## 2020-01-21 DIAGNOSIS — I700 Atherosclerosis of aorta: Secondary | ICD-10-CM | POA: Diagnosis not present

## 2020-01-21 DIAGNOSIS — E039 Hypothyroidism, unspecified: Secondary | ICD-10-CM | POA: Diagnosis not present

## 2020-01-21 DIAGNOSIS — I2510 Atherosclerotic heart disease of native coronary artery without angina pectoris: Secondary | ICD-10-CM | POA: Diagnosis not present

## 2020-02-10 DIAGNOSIS — N183 Chronic kidney disease, stage 3 unspecified: Secondary | ICD-10-CM | POA: Diagnosis not present

## 2020-02-10 DIAGNOSIS — I129 Hypertensive chronic kidney disease with stage 1 through stage 4 chronic kidney disease, or unspecified chronic kidney disease: Secondary | ICD-10-CM | POA: Diagnosis not present

## 2020-02-10 DIAGNOSIS — E7849 Other hyperlipidemia: Secondary | ICD-10-CM | POA: Diagnosis not present

## 2020-02-10 DIAGNOSIS — M810 Age-related osteoporosis without current pathological fracture: Secondary | ICD-10-CM | POA: Diagnosis not present

## 2020-03-11 DIAGNOSIS — I129 Hypertensive chronic kidney disease with stage 1 through stage 4 chronic kidney disease, or unspecified chronic kidney disease: Secondary | ICD-10-CM | POA: Diagnosis not present

## 2020-03-11 DIAGNOSIS — N183 Chronic kidney disease, stage 3 unspecified: Secondary | ICD-10-CM | POA: Diagnosis not present

## 2020-03-11 DIAGNOSIS — M810 Age-related osteoporosis without current pathological fracture: Secondary | ICD-10-CM | POA: Diagnosis not present

## 2020-03-11 DIAGNOSIS — E7849 Other hyperlipidemia: Secondary | ICD-10-CM | POA: Diagnosis not present

## 2020-03-24 DIAGNOSIS — M75112 Incomplete rotator cuff tear or rupture of left shoulder, not specified as traumatic: Secondary | ICD-10-CM | POA: Diagnosis not present

## 2020-03-24 DIAGNOSIS — M75111 Incomplete rotator cuff tear or rupture of right shoulder, not specified as traumatic: Secondary | ICD-10-CM | POA: Diagnosis not present

## 2020-04-08 DIAGNOSIS — I10 Essential (primary) hypertension: Secondary | ICD-10-CM | POA: Diagnosis not present

## 2020-04-10 DIAGNOSIS — N183 Chronic kidney disease, stage 3 unspecified: Secondary | ICD-10-CM | POA: Diagnosis not present

## 2020-04-10 DIAGNOSIS — M810 Age-related osteoporosis without current pathological fracture: Secondary | ICD-10-CM | POA: Diagnosis not present

## 2020-04-10 DIAGNOSIS — E7849 Other hyperlipidemia: Secondary | ICD-10-CM | POA: Diagnosis not present

## 2020-04-10 DIAGNOSIS — I129 Hypertensive chronic kidney disease with stage 1 through stage 4 chronic kidney disease, or unspecified chronic kidney disease: Secondary | ICD-10-CM | POA: Diagnosis not present

## 2020-04-28 ENCOUNTER — Other Ambulatory Visit: Payer: Self-pay

## 2020-04-28 ENCOUNTER — Inpatient Hospital Stay (HOSPITAL_COMMUNITY): Payer: Medicare Other | Attending: Hematology

## 2020-04-28 DIAGNOSIS — E538 Deficiency of other specified B group vitamins: Secondary | ICD-10-CM | POA: Insufficient documentation

## 2020-04-28 DIAGNOSIS — D509 Iron deficiency anemia, unspecified: Secondary | ICD-10-CM | POA: Diagnosis not present

## 2020-04-28 LAB — IRON AND TIBC
Iron: 32 ug/dL (ref 28–170)
Saturation Ratios: 9 % — ABNORMAL LOW (ref 10.4–31.8)
TIBC: 343 ug/dL (ref 250–450)
UIBC: 311 ug/dL

## 2020-04-28 LAB — COMPREHENSIVE METABOLIC PANEL
ALT: 30 U/L (ref 0–44)
AST: 29 U/L (ref 15–41)
Albumin: 3.4 g/dL — ABNORMAL LOW (ref 3.5–5.0)
Alkaline Phosphatase: 42 U/L (ref 38–126)
Anion gap: 9 (ref 5–15)
BUN: 20 mg/dL (ref 8–23)
CO2: 26 mmol/L (ref 22–32)
Calcium: 8.9 mg/dL (ref 8.9–10.3)
Chloride: 102 mmol/L (ref 98–111)
Creatinine, Ser: 1.02 mg/dL — ABNORMAL HIGH (ref 0.44–1.00)
GFR calc Af Amer: 59 mL/min — ABNORMAL LOW (ref >60)
GFR calc non Af Amer: 51 mL/min — ABNORMAL LOW (ref >60)
Glucose, Bld: 123 mg/dL — ABNORMAL HIGH (ref 70–99)
Potassium: 4.4 mmol/L (ref 3.5–5.1)
Sodium: 137 mmol/L (ref 135–145)
Total Bilirubin: 0.4 mg/dL (ref 0.3–1.2)
Total Protein: 5.9 g/dL — ABNORMAL LOW (ref 6.5–8.1)

## 2020-04-28 LAB — VITAMIN B12: Vitamin B-12: UNDETERMINED pg/mL (ref 180–914)

## 2020-04-28 LAB — VITAMIN D 25 HYDROXY (VIT D DEFICIENCY, FRACTURES): Vit D, 25-Hydroxy: 47.28 ng/mL (ref 30–100)

## 2020-04-28 LAB — CBC WITH DIFFERENTIAL/PLATELET
Abs Immature Granulocytes: 0.01 10*3/uL (ref 0.00–0.07)
Basophils Absolute: 0 10*3/uL (ref 0.0–0.1)
Basophils Relative: 1 %
Eosinophils Absolute: 0.2 10*3/uL (ref 0.0–0.5)
Eosinophils Relative: 4 %
HCT: 40.8 % (ref 36.0–46.0)
Hemoglobin: 12.6 g/dL (ref 12.0–15.0)
Immature Granulocytes: 0 %
Lymphocytes Relative: 16 %
Lymphs Abs: 0.7 10*3/uL (ref 0.7–4.0)
MCH: 30.1 pg (ref 26.0–34.0)
MCHC: 30.9 g/dL (ref 30.0–36.0)
MCV: 97.4 fL (ref 80.0–100.0)
Monocytes Absolute: 0.4 10*3/uL (ref 0.1–1.0)
Monocytes Relative: 9 %
Neutro Abs: 3.1 10*3/uL (ref 1.7–7.7)
Neutrophils Relative %: 70 %
Platelets: 236 10*3/uL (ref 150–400)
RBC: 4.19 MIL/uL (ref 3.87–5.11)
RDW: 13.9 % (ref 11.5–15.5)
WBC: 4.5 10*3/uL (ref 4.0–10.5)
nRBC: 0 % (ref 0.0–0.2)

## 2020-04-28 LAB — FERRITIN: Ferritin: 30 ng/mL (ref 11–307)

## 2020-04-28 LAB — LACTATE DEHYDROGENASE: LDH: 192 U/L (ref 98–192)

## 2020-04-29 DIAGNOSIS — J441 Chronic obstructive pulmonary disease with (acute) exacerbation: Secondary | ICD-10-CM | POA: Diagnosis not present

## 2020-04-29 DIAGNOSIS — I10 Essential (primary) hypertension: Secondary | ICD-10-CM | POA: Diagnosis not present

## 2020-04-29 LAB — VITAMIN B12: Vitamin B-12: 3553 pg/mL — ABNORMAL HIGH (ref 180–914)

## 2020-05-05 ENCOUNTER — Other Ambulatory Visit: Payer: Self-pay

## 2020-05-05 ENCOUNTER — Inpatient Hospital Stay (HOSPITAL_BASED_OUTPATIENT_CLINIC_OR_DEPARTMENT_OTHER): Payer: Medicare Other | Admitting: Nurse Practitioner

## 2020-05-05 ENCOUNTER — Other Ambulatory Visit (HOSPITAL_COMMUNITY): Payer: Self-pay | Admitting: Nurse Practitioner

## 2020-05-05 DIAGNOSIS — D509 Iron deficiency anemia, unspecified: Secondary | ICD-10-CM | POA: Diagnosis not present
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
## 2020-05-05 NOTE — Assessment & Plan Note (Signed)
1.Iron deficiency anemia: - EGD on 01/31/2017 and it shows web in the proximal esophagus and mild gastritis. - Colonoscopy on 08/03/2015 showed internal hemorrhoids, few scattered pancolonic diverticula with no evidence of inflammatory disease. -She has tried taking oral iron in the past which caused severe constipation.  Also had poor absorption with oral iron therapy. -She last received Feraheme on 10/23/2018. -She denies any bright red bleeding per rectum or melena.   -Labs on 04/28/2020 showed hemoglobin 12.6, ferritin 30, percent saturation 9, and platelets 236 -She will follow-up in 6 months with repeat labs.  2.  Vitamin B12 deficiency: - She reports taking vitamin B12 medicine with her multivitamin. - Labs on 04/28/2020 showed her vitamin B12 level at 3553 - She continues to vitamin B12 daily -We will follow-up in 6 months with repeat labs.  3.  Vitamin D deficiency: - Labs on 06/18/2019 showed her vitamin D level at 13.88 - She was only taking 400 UT daily.  Last visit she was given a prescription for 1000 UT daily. -Her levels have decreased even further.  We will send her in a new prescription for 50,000 units weekly. -Labs done on 04/28/2020 showed her vitamin D level has improved to 47.28 -She will continue the 50,000 units weekly. -We will follow-up in 6 months with repeat labs.

## 2020-05-08 ENCOUNTER — Other Ambulatory Visit: Payer: Self-pay

## 2020-05-08 ENCOUNTER — Inpatient Hospital Stay (HOSPITAL_COMMUNITY): Payer: Medicare Other

## 2020-05-08 ENCOUNTER — Encounter (HOSPITAL_COMMUNITY): Payer: Self-pay

## 2020-05-08 VITALS — BP 155/76 | HR 66 | Temp 97.2°F | Resp 16

## 2020-05-08 DIAGNOSIS — D509 Iron deficiency anemia, unspecified: Secondary | ICD-10-CM

## 2020-05-08 DIAGNOSIS — D508 Other iron deficiency anemias: Secondary | ICD-10-CM

## 2020-05-08 DIAGNOSIS — E538 Deficiency of other specified B group vitamins: Secondary | ICD-10-CM | POA: Diagnosis not present

## 2020-05-08 MED ORDER — SODIUM CHLORIDE 0.9 % IV SOLN: Freq: Once | INTRAVENOUS | Status: AC

## 2020-05-08 MED ORDER — SODIUM CHLORIDE 0.9 % IV SOLN
510.0000 mg | Freq: Once | INTRAVENOUS | Status: AC
  Administered 2020-05-08: 510 mg via INTRAVENOUS
  Filled 2020-05-08: qty 510

## 2020-05-08 MED ORDER — ACETAMINOPHEN 325 MG PO TABS
650.0000 mg | ORAL_TABLET | Freq: Once | ORAL | Status: AC
  Administered 2020-05-08: 650 mg via ORAL
  Filled 2020-05-08: qty 2

## 2020-05-08 MED ORDER — LORATADINE 10 MG PO TABS
10.0000 mg | ORAL_TABLET | Freq: Once | ORAL | Status: AC
  Administered 2020-05-08: 10 mg via ORAL
  Filled 2020-05-08: qty 1

## 2020-05-08 NOTE — Patient Instructions (Signed)
[facility]  Discharge Instructions:   _______________________________________________________________  Thank you for choosing [facility] at [facility] to provide your oncology and hematology care.  To afford each patient quality time with our providers, please arrive at least 15 minutes before your scheduled appointment.  You need to re-schedule your appointment if you arrive 10 or more minutes late.  We strive to give you quality time with our providers, and arriving late affects you and other patients whose appointments are after yours.  Also, if you no show three or more times for appointments you may be dismissed from the clinic.  Again, thank you for choosing [facility] at [facility]. Our hope is that these requests will allow you access to exceptional care and in a timely manner. _______________________________________________________________  If you have questions after your visit, please contact our office at [phone] between the hours of 8:30 a.m. and 5:00 p.m. Voicemails left after 4:30 p.m. will not be returned until the following business day. _______________________________________________________________  For prescription refill requests, have your pharmacy contact our office. _______________________________________________________________  Recommendations made by the consultant and any test results will be sent to your referring physician. _______________________________________________________________ 

## 2020-05-08 NOTE — Progress Notes (Signed)
Tolerated iron infusion well today.  Pre-meds given prior to administration of iron. Discharged ambulatory.  Vital signs stable prior to discharge.

## 2020-05-12 DIAGNOSIS — I129 Hypertensive chronic kidney disease with stage 1 through stage 4 chronic kidney disease, or unspecified chronic kidney disease: Secondary | ICD-10-CM | POA: Diagnosis not present

## 2020-05-12 DIAGNOSIS — E7849 Other hyperlipidemia: Secondary | ICD-10-CM | POA: Diagnosis not present

## 2020-05-12 DIAGNOSIS — N183 Chronic kidney disease, stage 3 unspecified: Secondary | ICD-10-CM | POA: Diagnosis not present

## 2020-05-12 DIAGNOSIS — M810 Age-related osteoporosis without current pathological fracture: Secondary | ICD-10-CM | POA: Diagnosis not present

## 2020-05-15 ENCOUNTER — Other Ambulatory Visit: Payer: Self-pay

## 2020-05-15 ENCOUNTER — Inpatient Hospital Stay (HOSPITAL_COMMUNITY): Payer: Medicare Other | Attending: Hematology

## 2020-05-15 VITALS — BP 120/61 | HR 66 | Temp 97.0°F | Resp 16

## 2020-05-15 DIAGNOSIS — K219 Gastro-esophageal reflux disease without esophagitis: Secondary | ICD-10-CM | POA: Diagnosis not present

## 2020-05-15 DIAGNOSIS — N183 Chronic kidney disease, stage 3 unspecified: Secondary | ICD-10-CM | POA: Diagnosis not present

## 2020-05-15 DIAGNOSIS — E7800 Pure hypercholesterolemia, unspecified: Secondary | ICD-10-CM | POA: Diagnosis not present

## 2020-05-15 DIAGNOSIS — D509 Iron deficiency anemia, unspecified: Secondary | ICD-10-CM | POA: Insufficient documentation

## 2020-05-15 DIAGNOSIS — D508 Other iron deficiency anemias: Secondary | ICD-10-CM

## 2020-05-15 DIAGNOSIS — E039 Hypothyroidism, unspecified: Secondary | ICD-10-CM | POA: Diagnosis not present

## 2020-05-15 DIAGNOSIS — E782 Mixed hyperlipidemia: Secondary | ICD-10-CM | POA: Diagnosis not present

## 2020-05-15 MED ORDER — ACETAMINOPHEN 325 MG PO TABS
650.0000 mg | ORAL_TABLET | Freq: Once | ORAL | Status: DC
  Filled 2020-05-15: qty 2

## 2020-05-15 MED ORDER — SODIUM CHLORIDE 0.9 % IV SOLN: Freq: Once | INTRAVENOUS | Status: AC

## 2020-05-15 MED ORDER — LORATADINE 10 MG PO TABS
10.0000 mg | ORAL_TABLET | Freq: Once | ORAL | Status: DC
  Filled 2020-05-15: qty 1

## 2020-05-15 MED ORDER — SODIUM CHLORIDE 0.9 % IV SOLN
510.0000 mg | Freq: Once | INTRAVENOUS | Status: AC
  Administered 2020-05-15: 510 mg via INTRAVENOUS
  Filled 2020-05-15: qty 510

## 2020-05-15 NOTE — Progress Notes (Signed)
Iron infusion given per orders. Patient tolerated it well without problems. Vitals stable and discharged home from clinic ambulatory. Follow up as scheduled.  

## 2020-05-20 DIAGNOSIS — E039 Hypothyroidism, unspecified: Secondary | ICD-10-CM | POA: Diagnosis not present

## 2020-05-20 DIAGNOSIS — I2510 Atherosclerotic heart disease of native coronary artery without angina pectoris: Secondary | ICD-10-CM | POA: Diagnosis not present

## 2020-05-20 DIAGNOSIS — I10 Essential (primary) hypertension: Secondary | ICD-10-CM | POA: Diagnosis not present

## 2020-05-20 DIAGNOSIS — I700 Atherosclerosis of aorta: Secondary | ICD-10-CM | POA: Diagnosis not present

## 2020-05-20 DIAGNOSIS — E782 Mixed hyperlipidemia: Secondary | ICD-10-CM | POA: Diagnosis not present

## 2020-05-20 DIAGNOSIS — R4582 Worries: Secondary | ICD-10-CM | POA: Diagnosis not present

## 2020-05-26 DIAGNOSIS — S83241A Other tear of medial meniscus, current injury, right knee, initial encounter: Secondary | ICD-10-CM | POA: Diagnosis not present

## 2020-05-26 DIAGNOSIS — M75111 Incomplete rotator cuff tear or rupture of right shoulder, not specified as traumatic: Secondary | ICD-10-CM | POA: Diagnosis not present

## 2020-05-27 DIAGNOSIS — E782 Mixed hyperlipidemia: Secondary | ICD-10-CM | POA: Diagnosis not present

## 2020-05-27 DIAGNOSIS — I10 Essential (primary) hypertension: Secondary | ICD-10-CM | POA: Diagnosis not present

## 2020-05-27 DIAGNOSIS — E441 Mild protein-calorie malnutrition: Secondary | ICD-10-CM | POA: Diagnosis not present

## 2020-05-27 DIAGNOSIS — I700 Atherosclerosis of aorta: Secondary | ICD-10-CM | POA: Diagnosis not present

## 2020-07-01 DIAGNOSIS — Z23 Encounter for immunization: Secondary | ICD-10-CM | POA: Diagnosis not present

## 2020-07-10 DIAGNOSIS — M25532 Pain in left wrist: Secondary | ICD-10-CM | POA: Diagnosis not present

## 2020-07-11 DIAGNOSIS — E7849 Other hyperlipidemia: Secondary | ICD-10-CM | POA: Diagnosis not present

## 2020-07-11 DIAGNOSIS — I129 Hypertensive chronic kidney disease with stage 1 through stage 4 chronic kidney disease, or unspecified chronic kidney disease: Secondary | ICD-10-CM | POA: Diagnosis not present

## 2020-07-11 DIAGNOSIS — N183 Chronic kidney disease, stage 3 unspecified: Secondary | ICD-10-CM | POA: Diagnosis not present

## 2020-07-17 DIAGNOSIS — M25532 Pain in left wrist: Secondary | ICD-10-CM | POA: Diagnosis not present

## 2020-07-18 ENCOUNTER — Emergency Department (HOSPITAL_COMMUNITY): Payer: Medicare Other

## 2020-07-18 ENCOUNTER — Other Ambulatory Visit: Payer: Self-pay

## 2020-07-18 ENCOUNTER — Inpatient Hospital Stay (HOSPITAL_COMMUNITY)
Admission: EM | Admit: 2020-07-18 | Discharge: 2020-07-22 | DRG: 481 | Disposition: A | Discharge status: Skilled Nursing Facility | Payer: Medicare Other | Attending: Internal Medicine | Admitting: Internal Medicine

## 2020-07-18 DIAGNOSIS — D62 Acute posthemorrhagic anemia: Secondary | ICD-10-CM | POA: Diagnosis not present

## 2020-07-18 DIAGNOSIS — E782 Mixed hyperlipidemia: Secondary | ICD-10-CM | POA: Diagnosis not present

## 2020-07-18 DIAGNOSIS — E785 Hyperlipidemia, unspecified: Secondary | ICD-10-CM | POA: Diagnosis not present

## 2020-07-18 DIAGNOSIS — S72009A Fracture of unspecified part of neck of unspecified femur, initial encounter for closed fracture: Secondary | ICD-10-CM | POA: Diagnosis not present

## 2020-07-18 DIAGNOSIS — K5090 Crohn's disease, unspecified, without complications: Secondary | ICD-10-CM | POA: Diagnosis not present

## 2020-07-18 DIAGNOSIS — Y92017 Garden or yard in single-family (private) house as the place of occurrence of the external cause: Secondary | ICD-10-CM | POA: Diagnosis not present

## 2020-07-18 DIAGNOSIS — M810 Age-related osteoporosis without current pathological fracture: Secondary | ICD-10-CM | POA: Diagnosis not present

## 2020-07-18 DIAGNOSIS — F419 Anxiety disorder, unspecified: Secondary | ICD-10-CM | POA: Diagnosis present

## 2020-07-18 DIAGNOSIS — E890 Postprocedural hypothyroidism: Secondary | ICD-10-CM | POA: Diagnosis present

## 2020-07-18 DIAGNOSIS — Z85828 Personal history of other malignant neoplasm of skin: Secondary | ICD-10-CM | POA: Diagnosis not present

## 2020-07-18 DIAGNOSIS — K5900 Constipation, unspecified: Secondary | ICD-10-CM | POA: Diagnosis present

## 2020-07-18 DIAGNOSIS — S72002D Fracture of unspecified part of neck of left femur, subsequent encounter for closed fracture with routine healing: Secondary | ICD-10-CM | POA: Diagnosis not present

## 2020-07-18 DIAGNOSIS — Z7401 Bed confinement status: Secondary | ICD-10-CM | POA: Diagnosis not present

## 2020-07-18 DIAGNOSIS — Z66 Do not resuscitate: Secondary | ICD-10-CM | POA: Diagnosis not present

## 2020-07-18 DIAGNOSIS — Z9181 History of falling: Secondary | ICD-10-CM | POA: Diagnosis not present

## 2020-07-18 DIAGNOSIS — I10 Essential (primary) hypertension: Secondary | ICD-10-CM | POA: Diagnosis not present

## 2020-07-18 DIAGNOSIS — W010XXA Fall on same level from slipping, tripping and stumbling without subsequent striking against object, initial encounter: Secondary | ICD-10-CM | POA: Diagnosis present

## 2020-07-18 DIAGNOSIS — Y9389 Activity, other specified: Secondary | ICD-10-CM

## 2020-07-18 DIAGNOSIS — I252 Old myocardial infarction: Secondary | ICD-10-CM

## 2020-07-18 DIAGNOSIS — I2510 Atherosclerotic heart disease of native coronary artery without angina pectoris: Secondary | ICD-10-CM | POA: Diagnosis not present

## 2020-07-18 DIAGNOSIS — D509 Iron deficiency anemia, unspecified: Secondary | ICD-10-CM | POA: Diagnosis present

## 2020-07-18 DIAGNOSIS — S72012A Unspecified intracapsular fracture of left femur, initial encounter for closed fracture: Principal | ICD-10-CM | POA: Diagnosis present

## 2020-07-18 DIAGNOSIS — Z9861 Coronary angioplasty status: Secondary | ICD-10-CM

## 2020-07-18 DIAGNOSIS — S79929A Unspecified injury of unspecified thigh, initial encounter: Secondary | ICD-10-CM | POA: Diagnosis not present

## 2020-07-18 DIAGNOSIS — Z90711 Acquired absence of uterus with remaining cervical stump: Secondary | ICD-10-CM | POA: Diagnosis not present

## 2020-07-18 DIAGNOSIS — M6281 Muscle weakness (generalized): Secondary | ICD-10-CM | POA: Diagnosis not present

## 2020-07-18 DIAGNOSIS — S72002A Fracture of unspecified part of neck of left femur, initial encounter for closed fracture: Secondary | ICD-10-CM | POA: Diagnosis not present

## 2020-07-18 DIAGNOSIS — R0902 Hypoxemia: Secondary | ICD-10-CM | POA: Diagnosis not present

## 2020-07-18 DIAGNOSIS — M25552 Pain in left hip: Secondary | ICD-10-CM | POA: Diagnosis not present

## 2020-07-18 DIAGNOSIS — Z800 Family history of malignant neoplasm of digestive organs: Secondary | ICD-10-CM | POA: Diagnosis not present

## 2020-07-18 DIAGNOSIS — K219 Gastro-esophageal reflux disease without esophagitis: Secondary | ICD-10-CM | POA: Diagnosis not present

## 2020-07-18 DIAGNOSIS — R519 Headache, unspecified: Secondary | ICD-10-CM | POA: Diagnosis not present

## 2020-07-18 DIAGNOSIS — S72092A Other fracture of head and neck of left femur, initial encounter for closed fracture: Secondary | ICD-10-CM | POA: Diagnosis not present

## 2020-07-18 DIAGNOSIS — Z20822 Contact with and (suspected) exposure to covid-19: Secondary | ICD-10-CM | POA: Diagnosis present

## 2020-07-18 DIAGNOSIS — Z8719 Personal history of other diseases of the digestive system: Secondary | ICD-10-CM | POA: Diagnosis not present

## 2020-07-18 DIAGNOSIS — Z833 Family history of diabetes mellitus: Secondary | ICD-10-CM | POA: Diagnosis not present

## 2020-07-18 DIAGNOSIS — M21052 Valgus deformity, not elsewhere classified, left hip: Secondary | ICD-10-CM | POA: Diagnosis not present

## 2020-07-18 DIAGNOSIS — R52 Pain, unspecified: Secondary | ICD-10-CM

## 2020-07-18 DIAGNOSIS — M160 Bilateral primary osteoarthritis of hip: Secondary | ICD-10-CM | POA: Diagnosis not present

## 2020-07-18 DIAGNOSIS — W19XXXA Unspecified fall, initial encounter: Secondary | ICD-10-CM

## 2020-07-18 DIAGNOSIS — R6889 Other general symptoms and signs: Secondary | ICD-10-CM | POA: Diagnosis not present

## 2020-07-18 DIAGNOSIS — N179 Acute kidney failure, unspecified: Secondary | ICD-10-CM

## 2020-07-18 DIAGNOSIS — E039 Hypothyroidism, unspecified: Secondary | ICD-10-CM | POA: Diagnosis present

## 2020-07-18 DIAGNOSIS — R2689 Other abnormalities of gait and mobility: Secondary | ICD-10-CM | POA: Diagnosis not present

## 2020-07-18 DIAGNOSIS — T148XXA Other injury of unspecified body region, initial encounter: Secondary | ICD-10-CM

## 2020-07-18 DIAGNOSIS — Z743 Need for continuous supervision: Secondary | ICD-10-CM | POA: Diagnosis not present

## 2020-07-18 DIAGNOSIS — M2550 Pain in unspecified joint: Secondary | ICD-10-CM | POA: Diagnosis not present

## 2020-07-18 DIAGNOSIS — K5909 Other constipation: Secondary | ICD-10-CM | POA: Diagnosis present

## 2020-07-18 LAB — URINALYSIS, ROUTINE W REFLEX MICROSCOPIC
Bilirubin Urine: NEGATIVE
Glucose, UA: NEGATIVE mg/dL
Hgb urine dipstick: NEGATIVE
Ketones, ur: 5 mg/dL — AB
Leukocytes,Ua: NEGATIVE
Nitrite: NEGATIVE
Protein, ur: NEGATIVE mg/dL
Specific Gravity, Urine: 1.01 (ref 1.005–1.030)
pH: 6 (ref 5.0–8.0)

## 2020-07-18 LAB — BASIC METABOLIC PANEL
Anion gap: 8 (ref 5–15)
BUN: 18 mg/dL (ref 8–23)
CO2: 29 mmol/L (ref 22–32)
Calcium: 9 mg/dL (ref 8.9–10.3)
Chloride: 101 mmol/L (ref 98–111)
Creatinine, Ser: 1.22 mg/dL — ABNORMAL HIGH (ref 0.44–1.00)
GFR, Estimated: 44 mL/min — ABNORMAL LOW (ref >60)
Glucose, Bld: 106 mg/dL — ABNORMAL HIGH (ref 70–99)
Potassium: 4.2 mmol/L (ref 3.5–5.1)
Sodium: 138 mmol/L (ref 135–145)

## 2020-07-18 LAB — CBC WITH DIFFERENTIAL/PLATELET
Abs Immature Granulocytes: 0.04 10*3/uL (ref 0.00–0.07)
Basophils Absolute: 0 10*3/uL (ref 0.0–0.1)
Basophils Relative: 0 %
Eosinophils Absolute: 0.1 10*3/uL (ref 0.0–0.5)
Eosinophils Relative: 1 %
HCT: 39.9 % (ref 36.0–46.0)
Hemoglobin: 12.8 g/dL (ref 12.0–15.0)
Immature Granulocytes: 1 %
Lymphocytes Relative: 12 %
Lymphs Abs: 0.8 10*3/uL (ref 0.7–4.0)
MCH: 31 pg (ref 26.0–34.0)
MCHC: 32.1 g/dL (ref 30.0–36.0)
MCV: 96.6 fL (ref 80.0–100.0)
Monocytes Absolute: 0.6 10*3/uL (ref 0.1–1.0)
Monocytes Relative: 8 %
Neutro Abs: 5.4 10*3/uL (ref 1.7–7.7)
Neutrophils Relative %: 78 %
Platelets: 227 10*3/uL (ref 150–400)
RBC: 4.13 MIL/uL (ref 3.87–5.11)
RDW: 14.3 % (ref 11.5–15.5)
WBC: 6.8 10*3/uL (ref 4.0–10.5)
nRBC: 0 % (ref 0.0–0.2)

## 2020-07-18 LAB — RESPIRATORY PANEL BY RT PCR (FLU A&B, COVID)
Influenza A by PCR: NEGATIVE
Influenza B by PCR: NEGATIVE
SARS Coronavirus 2 by RT PCR: NEGATIVE

## 2020-07-18 LAB — PROTIME-INR
INR: 1 (ref 0.8–1.2)
Prothrombin Time: 12.7 seconds (ref 11.4–15.2)

## 2020-07-18 MED ORDER — OXYCODONE HCL 5 MG PO TABS
5.0000 mg | ORAL_TABLET | Freq: Four times a day (QID) | ORAL | Status: DC | PRN
  Administered 2020-07-19: 5 mg via ORAL
  Filled 2020-07-18: qty 1

## 2020-07-18 MED ORDER — ISOSORBIDE MONONITRATE ER 30 MG PO TB24
15.0000 mg | ORAL_TABLET | Freq: Every day | ORAL | Status: DC
  Administered 2020-07-20 – 2020-07-22 (×3): 15 mg via ORAL
  Filled 2020-07-18 (×3): qty 1

## 2020-07-18 MED ORDER — PANTOPRAZOLE SODIUM 40 MG PO TBEC
40.0000 mg | DELAYED_RELEASE_TABLET | Freq: Every day | ORAL | Status: DC
  Administered 2020-07-20 – 2020-07-22 (×3): 40 mg via ORAL
  Filled 2020-07-18 (×3): qty 1

## 2020-07-18 MED ORDER — HYDROMORPHONE HCL 1 MG/ML IJ SOLN
0.5000 mg | INTRAMUSCULAR | Status: DC | PRN
  Administered 2020-07-19 (×2): 0.5 mg via INTRAVENOUS
  Filled 2020-07-18 (×2): qty 0.5

## 2020-07-18 MED ORDER — HYDROCODONE-ACETAMINOPHEN 5-325 MG PO TABS: 1.0000 | ORAL_TABLET | Freq: Four times a day (QID) | ORAL | Status: DC | PRN

## 2020-07-18 MED ORDER — ONDANSETRON HCL 4 MG/2ML IJ SOLN
4.0000 mg | Freq: Once | INTRAMUSCULAR | Status: AC
  Administered 2020-07-18: 4 mg via INTRAVENOUS
  Filled 2020-07-18: qty 2

## 2020-07-18 MED ORDER — CYCLOBENZAPRINE HCL 5 MG PO TABS
5.0000 mg | ORAL_TABLET | Freq: Three times a day (TID) | ORAL | Status: DC | PRN
  Administered 2020-07-18 – 2020-07-22 (×7): 5 mg via ORAL
  Filled 2020-07-18 (×7): qty 1

## 2020-07-18 MED ORDER — LOTEPREDNOL ETABONATE 0.5 % OP SUSP: 2.0000 [drp] | Freq: Two times a day (BID) | OPHTHALMIC | Status: DC | PRN

## 2020-07-18 MED ORDER — METOPROLOL SUCCINATE ER 25 MG PO TB24
12.5000 mg | ORAL_TABLET | Freq: Every day | ORAL | Status: DC
  Administered 2020-07-19 – 2020-07-22 (×4): 12.5 mg via ORAL
  Filled 2020-07-18 (×4): qty 1

## 2020-07-18 MED ORDER — POLYETHYLENE GLYCOL 3350 17 G PO PACK
17.0000 g | PACK | Freq: Every day | ORAL | Status: DC | PRN
  Administered 2020-07-21 – 2020-07-22 (×2): 17 g via ORAL
  Filled 2020-07-18 (×2): qty 1

## 2020-07-18 MED ORDER — HYDROCHLOROTHIAZIDE 12.5 MG PO CAPS
12.5000 mg | ORAL_CAPSULE | Freq: Every day | ORAL | Status: DC
  Administered 2020-07-20 – 2020-07-22 (×3): 12.5 mg via ORAL
  Filled 2020-07-18 (×3): qty 1

## 2020-07-18 MED ORDER — CYCLOSPORINE 0.05 % OP EMUL
1.0000 [drp] | Freq: Every evening | OPHTHALMIC | Status: DC
  Administered 2020-07-18 – 2020-07-22 (×4): 1 [drp] via OPHTHALMIC
  Filled 2020-07-18 (×5): qty 1

## 2020-07-18 MED ORDER — LEVOTHYROXINE SODIUM 75 MCG PO TABS: 150.0000 ug | ORAL_TABLET | Freq: Every day | ORAL | Status: DC

## 2020-07-18 MED ORDER — LOSARTAN POTASSIUM 50 MG PO TABS: 50.0000 mg | ORAL_TABLET | Freq: Every day | ORAL | Status: DC

## 2020-07-18 MED ORDER — ACETAMINOPHEN 500 MG PO TABS
1000.0000 mg | ORAL_TABLET | Freq: Three times a day (TID) | ORAL | Status: DC
  Administered 2020-07-18 – 2020-07-19 (×2): 1000 mg via ORAL
  Filled 2020-07-18 (×3): qty 2

## 2020-07-18 MED ORDER — DICLOFENAC SODIUM 1 % TD GEL: 2.0000 g | Freq: Four times a day (QID) | TRANSDERMAL | Status: DC | PRN

## 2020-07-18 MED ORDER — LEVOTHYROXINE SODIUM 75 MCG PO TABS
175.0000 ug | ORAL_TABLET | Freq: Every day | ORAL | Status: DC
  Administered 2020-07-19 – 2020-07-22 (×4): 175 ug via ORAL
  Filled 2020-07-18 (×4): qty 1

## 2020-07-18 MED ORDER — ALPRAZOLAM 0.5 MG PO TABS
0.5000 mg | ORAL_TABLET | Freq: Two times a day (BID) | ORAL | Status: DC | PRN
  Administered 2020-07-18 – 2020-07-21 (×4): 0.5 mg via ORAL
  Filled 2020-07-18 (×4): qty 1

## 2020-07-18 MED ORDER — DIPHENHYDRAMINE HCL 25 MG PO CAPS: 25.0000 mg | ORAL_CAPSULE | Freq: Four times a day (QID) | ORAL | Status: DC | PRN

## 2020-07-18 MED ORDER — BISACODYL 5 MG PO TBEC
5.0000 mg | DELAYED_RELEASE_TABLET | Freq: Every day | ORAL | Status: DC | PRN
  Administered 2020-07-22: 5 mg via ORAL
  Filled 2020-07-18: qty 1

## 2020-07-18 MED ORDER — ONDANSETRON HCL 4 MG/2ML IJ SOLN
4.0000 mg | Freq: Once | INTRAMUSCULAR | Status: DC
  Filled 2020-07-18: qty 2

## 2020-07-18 MED ORDER — MORPHINE SULFATE (PF) 4 MG/ML IV SOLN
4.0000 mg | INTRAVENOUS | Status: DC | PRN
  Administered 2020-07-18: 4 mg via INTRAVENOUS
  Filled 2020-07-18: qty 1

## 2020-07-18 MED ORDER — VITAMIN D 25 MCG (1000 UNIT) PO TABS
1000.0000 [IU] | ORAL_TABLET | Freq: Every day | ORAL | Status: DC
  Administered 2020-07-20 – 2020-07-22 (×3): 1000 [IU] via ORAL
  Filled 2020-07-18 (×3): qty 1

## 2020-07-18 MED ORDER — PANTOPRAZOLE SODIUM 40 MG PO TBEC
40.0000 mg | DELAYED_RELEASE_TABLET | Freq: Every day | ORAL | Status: DC
Start: 2020-07-18 — End: 2020-07-18
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
## 2020-07-18 NOTE — ED Triage Notes (Signed)
Pt arrived via EMS for left hip pain from ground level fall. Pt reports she lost her balance when bending over, no loc, did not hit her head. Pt reports 10/10 pain, no obvious deformity. Received a total of 100 mcg of fentanyl on route.

## 2020-07-19 ENCOUNTER — Encounter (HOSPITAL_COMMUNITY): Payer: Self-pay | Admitting: Internal Medicine

## 2020-07-19 ENCOUNTER — Inpatient Hospital Stay (HOSPITAL_COMMUNITY): Payer: Medicare Other

## 2020-07-19 ENCOUNTER — Inpatient Hospital Stay (HOSPITAL_COMMUNITY): Payer: Medicare Other | Admitting: Anesthesiology

## 2020-07-19 ENCOUNTER — Encounter (HOSPITAL_COMMUNITY)
Admission: EM | Disposition: A | Discharge status: Skilled Nursing Facility | Payer: Self-pay | Source: Home / Self Care | Attending: Family Medicine

## 2020-07-19 HISTORY — PX: HIP PINNING,CANNULATED: SHX1758

## 2020-07-19 LAB — BASIC METABOLIC PANEL
Anion gap: 9 (ref 5–15)
BUN: 14 mg/dL (ref 8–23)
CO2: 28 mmol/L (ref 22–32)
Calcium: 8.8 mg/dL — ABNORMAL LOW (ref 8.9–10.3)
Chloride: 99 mmol/L (ref 98–111)
Creatinine, Ser: 1 mg/dL (ref 0.44–1.00)
GFR, Estimated: 56 mL/min — ABNORMAL LOW (ref >60)
Glucose, Bld: 148 mg/dL — ABNORMAL HIGH (ref 70–99)
Potassium: 3.7 mmol/L (ref 3.5–5.1)
Sodium: 136 mmol/L (ref 135–145)

## 2020-07-19 LAB — HEPATIC FUNCTION PANEL
ALT: 29 U/L (ref 0–44)
AST: 26 U/L (ref 15–41)
Albumin: 3 g/dL — ABNORMAL LOW (ref 3.5–5.0)
Alkaline Phosphatase: 46 U/L (ref 38–126)
Bilirubin, Direct: 0.1 mg/dL (ref 0.0–0.2)
Indirect Bilirubin: 0.3 mg/dL (ref 0.3–0.9)
Total Bilirubin: 0.4 mg/dL (ref 0.3–1.2)
Total Protein: 5.5 g/dL — ABNORMAL LOW (ref 6.5–8.1)

## 2020-07-19 LAB — CBC
HCT: 39.1 % (ref 36.0–46.0)
Hemoglobin: 12.6 g/dL (ref 12.0–15.0)
MCH: 31 pg (ref 26.0–34.0)
MCHC: 32.2 g/dL (ref 30.0–36.0)
MCV: 96.3 fL (ref 80.0–100.0)
Platelets: 208 10*3/uL (ref 150–400)
RBC: 4.06 MIL/uL (ref 3.87–5.11)
RDW: 14.5 % (ref 11.5–15.5)
WBC: 6.6 10*3/uL (ref 4.0–10.5)
nRBC: 0 % (ref 0.0–0.2)

## 2020-07-19 LAB — URINE CULTURE: Culture: <10000 — AB

## 2020-07-19 LAB — SURGICAL PCR SCREEN
MRSA, PCR: NEGATIVE
Staphylococcus aureus: POSITIVE — AB

## 2020-07-19 SURGERY — FIXATION, FEMUR, NECK, PERCUTANEOUS, USING SCREW
Anesthesia: General | Site: Hip | Laterality: Left

## 2020-07-19 MED ORDER — FENTANYL CITRATE (PF) 250 MCG/5ML IJ SOLN
INTRAMUSCULAR | Status: AC
  Filled 2020-07-19: qty 5

## 2020-07-19 MED ORDER — PROPOFOL 10 MG/ML IV BOLUS
INTRAVENOUS | Status: AC
  Filled 2020-07-19: qty 20

## 2020-07-19 MED ORDER — CEFAZOLIN SODIUM-DEXTROSE 2-4 GM/100ML-% IV SOLN
2.0000 g | INTRAVENOUS | Status: AC
  Administered 2020-07-19: 2 g via INTRAVENOUS

## 2020-07-19 MED ORDER — MUPIROCIN 2 % EX OINT
1.0000 "application " | TOPICAL_OINTMENT | Freq: Two times a day (BID) | CUTANEOUS | Status: DC
  Administered 2020-07-19 – 2020-07-22 (×7): 1 via NASAL
  Filled 2020-07-19 (×2): qty 22

## 2020-07-19 MED ORDER — ENOXAPARIN SODIUM 40 MG/0.4ML ~~LOC~~ SOLN
40.0000 mg | SUBCUTANEOUS | Status: DC
  Administered 2020-07-20 – 2020-07-22 (×3): 40 mg via SUBCUTANEOUS
  Filled 2020-07-19 (×3): qty 0.4

## 2020-07-19 MED ORDER — CEFAZOLIN SODIUM-DEXTROSE 2-4 GM/100ML-% IV SOLN
INTRAVENOUS | Status: AC
  Filled 2020-07-19: qty 100

## 2020-07-19 MED ORDER — CHLORHEXIDINE GLUCONATE 4 % EX LIQD: 60.0000 mL | Freq: Once | CUTANEOUS | Status: DC

## 2020-07-19 MED ORDER — ORAL CARE MOUTH RINSE: 15.0000 mL | Freq: Once | OROMUCOSAL | Status: AC

## 2020-07-19 MED ORDER — ALUM & MAG HYDROXIDE-SIMETH 200-200-20 MG/5ML PO SUSP: 30.0000 mL | ORAL | Status: DC | PRN

## 2020-07-19 MED ORDER — LIDOCAINE 2% (20 MG/ML) 5 ML SYRINGE
INTRAMUSCULAR | Status: DC | PRN
  Administered 2020-07-19: 20 mg via INTRAVENOUS

## 2020-07-19 MED ORDER — CEFAZOLIN SODIUM-DEXTROSE 2-4 GM/100ML-% IV SOLN
2.0000 g | Freq: Four times a day (QID) | INTRAVENOUS | Status: AC
  Administered 2020-07-19 – 2020-07-20 (×2): 2 g via INTRAVENOUS
  Filled 2020-07-19 (×2): qty 100

## 2020-07-19 MED ORDER — SODIUM CHLORIDE 0.9 % IV SOLN: INTRAVENOUS | Status: DC

## 2020-07-19 MED ORDER — PROPOFOL 10 MG/ML IV BOLUS
INTRAVENOUS | Status: DC | PRN
  Administered 2020-07-19: 100 mg via INTRAVENOUS

## 2020-07-19 MED ORDER — LIDOCAINE 2% (20 MG/ML) 5 ML SYRINGE
INTRAMUSCULAR | Status: AC
  Filled 2020-07-19: qty 5

## 2020-07-19 MED ORDER — ACETAMINOPHEN 10 MG/ML IV SOLN
INTRAVENOUS | Status: DC | PRN
  Administered 2020-07-19: 1000 mg via INTRAVENOUS

## 2020-07-19 MED ORDER — MENTHOL 3 MG MT LOZG: 1.0000 | LOZENGE | OROMUCOSAL | Status: DC | PRN

## 2020-07-19 MED ORDER — OXYCODONE HCL 5 MG PO TABS: 5.0000 mg | ORAL_TABLET | Freq: Once | ORAL | Status: DC | PRN

## 2020-07-19 MED ORDER — FENTANYL CITRATE (PF) 250 MCG/5ML IJ SOLN
INTRAMUSCULAR | Status: DC | PRN
  Administered 2020-07-19: 50 ug via INTRAVENOUS

## 2020-07-19 MED ORDER — DEXAMETHASONE SODIUM PHOSPHATE 10 MG/ML IJ SOLN
INTRAMUSCULAR | Status: AC
  Filled 2020-07-19: qty 1

## 2020-07-19 MED ORDER — LACTATED RINGERS IV SOLN: INTRAVENOUS | Status: DC

## 2020-07-19 MED ORDER — ROCURONIUM BROMIDE 10 MG/ML (PF) SYRINGE
PREFILLED_SYRINGE | INTRAVENOUS | Status: AC
  Filled 2020-07-19: qty 10

## 2020-07-19 MED ORDER — CHLORHEXIDINE GLUCONATE 0.12 % MT SOLN: 15.0000 mL | Freq: Once | OROMUCOSAL | Status: AC

## 2020-07-19 MED ORDER — FENTANYL CITRATE (PF) 100 MCG/2ML IJ SOLN
25.0000 ug | INTRAMUSCULAR | Status: DC | PRN
  Administered 2020-07-19: 50 ug via INTRAVENOUS
  Administered 2020-07-19 (×2): 25 ug via INTRAVENOUS

## 2020-07-19 MED ORDER — DEXAMETHASONE SODIUM PHOSPHATE 10 MG/ML IJ SOLN
INTRAMUSCULAR | Status: DC | PRN
  Administered 2020-07-19: 4 mg via INTRAVENOUS

## 2020-07-19 MED ORDER — ACETAMINOPHEN 500 MG PO TABS
ORAL_TABLET | ORAL | Status: AC
  Filled 2020-07-19: qty 2

## 2020-07-19 MED ORDER — ONDANSETRON HCL 4 MG/2ML IJ SOLN
INTRAMUSCULAR | Status: AC
  Filled 2020-07-19: qty 2

## 2020-07-19 MED ORDER — OXYCODONE HCL 5 MG/5ML PO SOLN: 5.0000 mg | Freq: Once | ORAL | Status: DC | PRN

## 2020-07-19 MED ORDER — ACETAMINOPHEN 500 MG PO TABS: 1000.0000 mg | ORAL_TABLET | Freq: Once | ORAL | Status: DC

## 2020-07-19 MED ORDER — SUGAMMADEX SODIUM 200 MG/2ML IV SOLN
INTRAVENOUS | Status: DC | PRN
  Administered 2020-07-19: 200 mg via INTRAVENOUS

## 2020-07-19 MED ORDER — ENSURE PRE-SURGERY PO LIQD
296.0000 mL | Freq: Once | ORAL | Status: AC
  Administered 2020-07-19: 296 mL via ORAL
  Filled 2020-07-19: qty 296

## 2020-07-19 MED ORDER — PHENOL 1.4 % MT LIQD: 1.0000 | OROMUCOSAL | Status: DC | PRN

## 2020-07-19 MED ORDER — ONDANSETRON HCL 4 MG/2ML IJ SOLN
INTRAMUSCULAR | Status: DC | PRN
  Administered 2020-07-19: 4 mg via INTRAVENOUS

## 2020-07-19 MED ORDER — 0.9 % SODIUM CHLORIDE (POUR BTL) OPTIME
TOPICAL | Status: DC | PRN
  Administered 2020-07-19: 1000 mL

## 2020-07-19 MED ORDER — MAGNESIUM CITRATE PO SOLN: 1.0000 | Freq: Once | ORAL | Status: DC | PRN

## 2020-07-19 MED ORDER — CHLORHEXIDINE GLUCONATE 0.12 % MT SOLN
OROMUCOSAL | Status: AC
  Administered 2020-07-19: 15 mL via OROMUCOSAL
  Filled 2020-07-19: qty 15

## 2020-07-19 MED ORDER — PHENYLEPHRINE 40 MCG/ML (10ML) SYRINGE FOR IV PUSH (FOR BLOOD PRESSURE SUPPORT)
PREFILLED_SYRINGE | INTRAVENOUS | Status: AC
  Filled 2020-07-19: qty 10

## 2020-07-19 MED ORDER — LACTATED RINGERS IV SOLN: INTRAVENOUS | Status: DC | PRN

## 2020-07-19 MED ORDER — ROCURONIUM BROMIDE 10 MG/ML (PF) SYRINGE
PREFILLED_SYRINGE | INTRAVENOUS | Status: DC | PRN
  Administered 2020-07-19: 40 mg via INTRAVENOUS

## 2020-07-19 MED ORDER — PHENYLEPHRINE HCL-NACL 10-0.9 MG/250ML-% IV SOLN
INTRAVENOUS | Status: DC | PRN
  Administered 2020-07-19: 25 ug/min via INTRAVENOUS

## 2020-07-19 MED ORDER — TRAMADOL HCL 50 MG PO TABS
50.0000 mg | ORAL_TABLET | Freq: Four times a day (QID) | ORAL | Status: DC | PRN
  Administered 2020-07-20 – 2020-07-22 (×7): 50 mg via ORAL
  Filled 2020-07-19 (×7): qty 1

## 2020-07-19 MED ORDER — FENTANYL CITRATE (PF) 100 MCG/2ML IJ SOLN
INTRAMUSCULAR | Status: AC
  Filled 2020-07-19: qty 2

## 2020-07-19 MED ORDER — ONDANSETRON HCL 4 MG PO TABS
4.0000 mg | ORAL_TABLET | Freq: Four times a day (QID) | ORAL | Status: DC | PRN
  Administered 2020-07-21: 4 mg via ORAL
  Filled 2020-07-19: qty 1

## 2020-07-19 MED ORDER — FERROUS SULFATE 325 (65 FE) MG PO TABS
325.0000 mg | ORAL_TABLET | Freq: Three times a day (TID) | ORAL | Status: DC
  Filled 2020-07-19 (×5): qty 1

## 2020-07-19 MED ORDER — ACETAMINOPHEN 500 MG PO TABS
500.0000 mg | ORAL_TABLET | Freq: Four times a day (QID) | ORAL | Status: AC
  Administered 2020-07-19 – 2020-07-20 (×3): 500 mg via ORAL
  Filled 2020-07-19 (×4): qty 1

## 2020-07-19 MED ORDER — ONDANSETRON HCL 4 MG/2ML IJ SOLN: 4.0000 mg | Freq: Four times a day (QID) | INTRAMUSCULAR | Status: DC | PRN

## 2020-07-19 MED ORDER — CHLORHEXIDINE GLUCONATE CLOTH 2 % EX PADS: 6.0000 | MEDICATED_PAD | Freq: Every day | CUTANEOUS | Status: DC

## 2020-07-19 SURGICAL SUPPLY — 35 items
BIT DRILL CANNULATED 5.0 (BIT) ×2 IMPLANT
BNDG COHESIVE 6X5 TAN STRL LF (GAUZE/BANDAGES/DRESSINGS) ×4 IMPLANT
CLOSURE STERI-STRIP 1/2X4 (GAUZE/BANDAGES/DRESSINGS) ×1
CLSR STERI-STRIP ANTIMIC 1/2X4 (GAUZE/BANDAGES/DRESSINGS) ×1 IMPLANT
COVER PERINEAL POST (MISCELLANEOUS) ×3 IMPLANT
COVER SURGICAL LIGHT HANDLE (MISCELLANEOUS) ×3 IMPLANT
DRAPE STERI IOBAN 125X83 (DRAPES) ×3 IMPLANT
DRSG MEPILEX BORDER 4X4 (GAUZE/BANDAGES/DRESSINGS) ×3 IMPLANT
DURAPREP 26ML APPLICATOR (WOUND CARE) ×3 IMPLANT
ELECT REM PT RETURN 9FT ADLT (ELECTROSURGICAL) ×3
ELECTRODE REM PT RTRN 9FT ADLT (ELECTROSURGICAL) ×1 IMPLANT
GLOVE BIO SURGEON STRL SZ7 (GLOVE) ×6 IMPLANT
GLOVE BIOGEL PI IND STRL 7.0 (GLOVE) ×1 IMPLANT
GLOVE BIOGEL PI INDICATOR 7.0 (GLOVE) ×2
GLOVE ORTHO TXT STRL SZ7.5 (GLOVE) ×3 IMPLANT
GOWN STRL REUS W/ TWL LRG LVL3 (GOWN DISPOSABLE) IMPLANT
GOWN STRL REUS W/ TWL XL LVL3 (GOWN DISPOSABLE) ×1 IMPLANT
GOWN STRL REUS W/TWL LRG LVL3 (GOWN DISPOSABLE) ×6
GOWN STRL REUS W/TWL XL LVL3 (GOWN DISPOSABLE) ×3
GUIDEPIN 3.2MM DIA 12INL (PIN) ×6 IMPLANT
KIT BASIN OR (CUSTOM PROCEDURE TRAY) ×3 IMPLANT
KIT TURNOVER KIT B (KITS) ×3 IMPLANT
NS IRRIG 1000ML POUR BTL (IV SOLUTION) ×3 IMPLANT
PACK GENERAL/GYN (CUSTOM PROCEDURE TRAY) ×3 IMPLANT
PAD ARMBOARD 7.5X6 YLW CONV (MISCELLANEOUS) ×6 IMPLANT
SCREW CANN 7.0X85MM (Screw) ×6 IMPLANT
SCREW CANN RVRS CUT FLT 85X16X (Screw) IMPLANT
SCREW CANN SD 7X95 (Screw) ×2 IMPLANT
SUT VIC AB 0 CT1 27 (SUTURE) ×3
SUT VIC AB 0 CT1 27XBRD ANBCTR (SUTURE) IMPLANT
SUT VIC AB 3-0 SH 27 (SUTURE) ×3
SUT VIC AB 3-0 SH 27X BRD (SUTURE) IMPLANT
TOWEL GREEN STERILE (TOWEL DISPOSABLE) ×3 IMPLANT
TOWEL GREEN STERILE FF (TOWEL DISPOSABLE) ×3 IMPLANT
WATER STERILE IRR 1000ML POUR (IV SOLUTION) ×3 IMPLANT

## 2020-07-19 NOTE — Anesthesia Preprocedure Evaluation (Addendum)
Anesthesia Evaluation  Patient identified by MRN, date of birth, ID band Patient awake    Reviewed: Allergy & Precautions, NPO status , Patient's Chart, lab work & pertinent test results, reviewed documented beta blocker date and time   History of Anesthesia Complications Negative for: history of anesthetic complications  Airway Mallampati: II  TM Distance: >3 FB Neck ROM: Full    Dental  (+) Poor Dentition, Missing, Dental Advisory Given   Pulmonary neg pulmonary ROS,  07/18/2020 SARS coronavirus NEG   breath sounds clear to auscultation       Cardiovascular hypertension, Pt. on medications and Pt. on home beta blockers (-) angina+ CAD (RCA angioplasty in 2010) and + Past MI   Rhythm:Regular Rate:Normal  '19 ECHO: EF 55- 60%. no regional wall motion  abnormalities, mild AI, trivial MR, trivial TR '14 Stress: EF 75% with stress, no ischemia   Neuro/Psych negative neurological ROS     GI/Hepatic Neg liver ROS, GERD  Medicated and Controlled,  Endo/Other  Hypothyroidism   Renal/GU negative Renal ROS     Musculoskeletal  (+) Arthritis ,   Abdominal   Peds  Hematology negative hematology ROS (+)   Anesthesia Other Findings   Reproductive/Obstetrics                            Anesthesia Physical Anesthesia Plan  ASA: III  Anesthesia Plan: General   Post-op Pain Management:    Induction: Intravenous  PONV Risk Score and Plan: 3 and Ondansetron, Dexamethasone and Treatment may vary due to age or medical condition  Airway Management Planned: Oral ETT  Additional Equipment: None  Intra-op Plan:   Post-operative Plan: Extubation in OR  Informed Consent: I have reviewed the patients History and Physical, chart, labs and discussed the procedure including the risks, benefits and alternatives for the proposed anesthesia with the patient or authorized representative who has indicated  his/her understanding and acceptance.   Patient has DNR.  Discussed DNR with patient and Suspend DNR.   Dental advisory given  Plan Discussed with: CRNA and Surgeon  Anesthesia Plan Comments:       Anesthesia Quick Evaluation

## 2020-07-19 NOTE — Anesthesia Procedure Notes (Addendum)
Procedure Name: Intubation Date/Time: 07/19/2020 2:04 PM Performed by: Renato Shin, CRNA Pre-anesthesia Checklist: Patient identified, Emergency Drugs available, Suction available and Patient being monitored Patient Re-evaluated:Patient Re-evaluated prior to induction Oxygen Delivery Method: Circle system utilized Preoxygenation: Pre-oxygenation with 100% oxygen Induction Type: IV induction Ventilation: Mask ventilation without difficulty Laryngoscope Size: Miller and 2 Grade View: Grade I Tube type: Oral Tube size: 7.0 mm Number of attempts: 1 Airway Equipment and Method: Stylet Placement Confirmation: ETT inserted through vocal cords under direct vision,  positive ETCO2 and breath sounds checked- equal and bilateral Secured at: 19 cm Tube secured with: Tape Dental Injury: Teeth and Oropharynx as per pre-operative assessment

## 2020-07-19 NOTE — Plan of Care (Signed)

## 2020-07-19 NOTE — Progress Notes (Signed)
PROGRESS NOTE    Andrea Jackson  ERX:[id number] DOB: September 21, 1933 DOA: 07/18/2020 PCP: Caryl Bis, MD  Brief Narrative:  86 year old white female CAD angioplasty ICA 2010 not on aspirin secondary to small bowel source of recurrent GI bleed--?  Crohn's Pancreatic mass is no longer found on imaging Prior history of aortic dissection Prior esophageal web status post dilatation Iron deficiency anemia follows with Dr. Delton Coombes and gets intermittent iron infusion Small bowel obstructions in 2013 and 2016 HTN HLD BPV  Presented from home with accidental fall with immediate left hip pain Found to have nondisplaced transcervical fracture left hip Mild AKI BUN/creatinine up from 20/1.0 up to 18/1.2    Assessment & Plan:   Principal Problem:   Nondisplaced fracture of neck of femur ([place]) Active Problems:   Hypothyroidism   Essential hypertension, benign   CORONARY ATHEROSCLEROSIS NATIVE CORONARY ARTERY   Chronic constipation   GERD (gastroesophageal reflux disease)   AKI (acute kidney injury) ([place])   1. Left nondisplaced transcervical femoral neck fracture a. Defer to orthopedics plan for surgery-keep n.p.o. b. Pain management currently oxycodone 5 every 6 as needed, Dilaudid 0.5 every 3 as needed severe and continues Tylenol 1000 for pain for steroids c. At baseline takes Flexeril which was continued d. Weightbearing status and further disposition as per them and therapy eval eventually 2. AKI a. Improved slightly we will start saline at 50 cc an hour b. We will discontinue completely Cozaar 50 3. HTN with CAD and angioplasty 2010 a. Continue Imdur 30 daily, HCTZ 12.5, metoprolol XL 12.5 daily b. Cozaar PTA 100 mg but has been discontinued completely 4. Reflux, prior small bowel obstructions, prior esophageal web a. Outpatient follow-up with GI no concerns at this time b. Continue Protonix 40 daily 5. Hypothyroid a. Continue Synthroid 150 every morning recheck TSH  probably in about 3 weeks 6. Chronic constipation 7. Prior aortic dissection a. Outpatient follow-up and imaging as per cardiology who sees her  DVT prophylaxis: Defer to orthopedics Code Status: DNR confirmed Family Communication: Disposition:   Status is: Inpatient  Remains inpatient appropriate because:Ongoing active pain requiring inpatient pain management and Altered mental status   Dispo: The patient is from: Home              Anticipated d/c is to: SNF              Anticipated d/c date is: 2 days              Patient currently is not medically stable to d/c.       Consultants:   Orthopedics  Procedures: None as yet  Antimicrobials: None   Subjective:   coherent states that the pain in her hip is severe wake No distress Person accidental fall did not have any dizziness prior to it  Objective: Vitals:   07/18/20 2215 07/18/20 2253 07/19/20 0527 07/19/20 0720  BP: (!) 138/99 (!) 157/84 (!) 100/56 107/65  Pulse: 69 68 (!) 59 (!) 55  Resp: 18 17 17 14   Temp:  98.7 F (37.1 C) 98.8 F (37.1 C) 97.6 F (36.4 C)  TempSrc:  Oral  Oral  SpO2: 100% 91% 92% 94%   No intake or output data in the 24 hours ending 07/19/20 0732 There were no vitals filed for this visit.  Examination: EOMI NCAT frail-appearing white female no distress S1-S2 no murmur no rub no gallop Abdomen soft scaphoid  chest clinically clear anteriorly no rales no rhonchi Abdomen soft no rebound  no guarding Left hip externally rotated neurologically intact no focal deficit  Data Reviewed: I have personally reviewed following labs and imaging studies  BUNs/creatinine 18/1.22-->14/1.0 LFTs normal Hemoglobin 12.6, WBC 6.6, platelet 208   Radiology Studies: DG Pelvis 1-2 Views  Result Date: 07/18/2020 CLINICAL DATA:  Fall left femur pain EXAM: PELVIS - 1-2 VIEW COMPARISON:  None. FINDINGS: There is a slightly impacted nondisplaced transcervical left femoral neck fracture. No other  definite fracture is identified. Moderate bilateral hip osteoarthritis is seen. IMPRESSION: Slightly impacted nondisplaced left transcervical femoral neck fracture. Electronically Signed   By: Prudencio Pair M.D.   On: 07/18/2020 16:35   DG FEMUR MIN 2 VIEWS LEFT  Result Date: 07/18/2020 CLINICAL DATA:  Fall femur pain EXAM: LEFT FEMUR 2 VIEWS COMPARISON:  None. FINDINGS: There is a nondisplaced transcervical femoral neck fracture which is slightly impacted. The femoral head is still well seated within the acetabulum. Overlying soft tissue swelling is seen. IMPRESSION: Nondisplaced transcervical femoral neck fracture. Electronically Signed   By: Prudencio Pair M.D.   On: 07/18/2020 16:33     Scheduled Meds: . acetaminophen  1,000 mg Oral Q8H  . Chlorhexidine Gluconate Cloth  6 each Topical Q0600  . cholecalciferol  1,000 Units Oral Daily  . cycloSPORINE  1 drop Both Eyes QPM  . hydrochlorothiazide  12.5 mg Oral Daily  . isosorbide mononitrate  15 mg Oral Daily  . levothyroxine  175 mcg Oral QAC breakfast  . losartan  50 mg Oral Daily  . metoprolol succinate  12.5 mg Oral Daily  . mupirocin ointment  1 application Nasal BID  . ondansetron (ZOFRAN) IV  4 mg Intravenous Once  . pantoprazole  40 mg Oral Daily   Continuous Infusions:   LOS: 1 day    Time spent: 24  Nita Sells, MD [facility] To contact the attending provider between 7A-7P or the covering provider during after hours 7P-7A, please log into the web site [email or web address] and access using universal [place] password for that web site. If you do not have the password, please call the hospital operator.  07/19/2020, 7:32 AM

## 2020-07-19 NOTE — Discharge Instructions (Signed)

## 2020-07-19 NOTE — Anesthesia Postprocedure Evaluation (Addendum)
Anesthesia Post Note  Patient: Andrea Jackson  Procedure(s) Performed: CANNULATED HIP PINNING (Left Hip)     Patient location during evaluation: PACU Anesthesia Type: General Level of consciousness: awake and alert, patient cooperative and oriented Pain management: pain level controlled (pain improving) Vital Signs Assessment: post-procedure vital signs reviewed and stable Respiratory status: spontaneous breathing, nonlabored ventilation and respiratory function stable Cardiovascular status: blood pressure returned to baseline and stable Postop Assessment: no apparent nausea or vomiting Anesthetic complications: no   No complications documented.  Last Vitals:  Vitals:   07/19/20 1630 07/19/20 1646  BP: (!) 156/67 (!) 155/66  Pulse: 66 69  Resp: 20 17  Temp: (!) 36.2 C 36.9 C  SpO2: 100% 94%    Last Pain:  Vitals:   07/19/20 1646  TempSrc: Oral  PainSc: 10-Worst pain ever                 [name],E. Zeta Bucy

## 2020-07-19 NOTE — Transfer of Care (Signed)
Immediate Anesthesia Transfer of Care Note  Patient: Andrea Jackson  Procedure(s) Performed: CANNULATED HIP PINNING (Left Hip)  Patient Location: PACU  Anesthesia Type:General  Level of Consciousness: awake, alert  and patient cooperative  Airway & Oxygen Therapy: Patient Spontanous Breathing  Post-op Assessment: Report given to RN and Post -op Vital signs reviewed and stable  Post vital signs: Reviewed and stable  Last Vitals:  Vitals Value Taken Time  BP 129/103 07/19/20 1514  Temp    Pulse 69 07/19/20 1516  Resp 16 07/19/20 1516  SpO2 96 % 07/19/20 1516  Vitals shown include unvalidated device data.  Last Pain:  Vitals:   07/19/20 1315  TempSrc:   PainSc: 6       Patients Stated Pain Goal: 3 (72/90/21 1155)  Complications: No complications documented.

## 2020-07-19 NOTE — Progress Notes (Signed)
The risks benefits and alternatives were discussed with the patient including but not limited to the risks of nonoperative treatment, versus surgical intervention including infection, bleeding, nerve injury, malunion, nonunion, the need for revision surgery, hardware prominence, hardware failure, the need for hardware removal, blood clots, AVN, the need for future arthroplasty, cardiopulmonary complications, morbidity, mortality, among others, and they were willing to proceed.    Plan left hip pinning.   Johnny Bridge, MD

## 2020-07-19 NOTE — Op Note (Signed)
07/18/2020 - 07/19/2020  3:00 PM  PATIENT:  Andrea Jackson DIAGNOSIS:  Left Hip Fracture, valgus impacted  POST-OPERATIVE DIAGNOSIS:  Same  PROCEDURE:  LEFT CANNULATED HIP PINNING  SURGEON:  Johnny Bridge, MD  PHYSICIAN ASSISTANT: Merlene Pulling, PA-C,  present and scrubbed throughout the case, critical for completion in a timely fashion, and for retraction, instrumentation, and closure.  ANESTHESIA:   General  ESTIMATED BLOOD LOSS: 1ml.  PREOPERATIVE INDICATIONS:  Andrea Jackson is a  86 y.o. female who fell and was found to have a diagnosis of Left Hip Fracture who elected for surgical management.    The risks benefits and alternatives were discussed with the patient preoperatively including but not limited to the risks of infection, bleeding, nerve injury, cardiopulmonary complications, blood clots, malunion, nonunion, avascular necrosis, the need for revision surgery, the potential for conversion to hemiarthroplasty, among others, and the patient was willing to proceed.  OPERATIVE IMPLANTS: 7.0 mm cannulated screws x3 from [employer]  OPERATIVE FINDINGS: Clinical osteoporosis with weak bone, proximal femur  OPERATIVE PROCEDURE: The patient was brought to the operating room and placed in supine position. IV antibiotics were given. General anesthesia administered.  The patient was placed on the fracture table. The operative extremity was positioned, without any significant reduction maneuver and was prepped and draped in usual sterile fashion.  Time out was performed.  Small incision was made distal to the greater trochanter, and 3 guidewires were introduced Into an inverted triangle configuration. The lengths were measured. The reduction was slightly valgus, and near-anatomic. I opened the cortex with a cannulated drill, and then placed the screws into position. Satisfactory fixation was achieved.  The wounds were irrigated copiously, and repaired with Vicryl with  Steri-Strips and sterile gauze. There no complications and the patient tolerated the procedure well.  The patient will be weightbearing as tolerated, and will be on chemoprophylaxis for a period of 4 weeks after discharge for DVT prophylaxis.

## 2020-07-19 NOTE — Plan of Care (Signed)

## 2020-07-20 ENCOUNTER — Encounter (HOSPITAL_COMMUNITY): Payer: Self-pay | Admitting: Orthopedic Surgery

## 2020-07-20 LAB — COMPREHENSIVE METABOLIC PANEL
ALT: 23 U/L (ref 0–44)
AST: 23 U/L (ref 15–41)
Albumin: 2.5 g/dL — ABNORMAL LOW (ref 3.5–5.0)
Alkaline Phosphatase: 44 U/L (ref 38–126)
Anion gap: 10 (ref 5–15)
BUN: 13 mg/dL (ref 8–23)
CO2: 24 mmol/L (ref 22–32)
Calcium: 8.2 mg/dL — ABNORMAL LOW (ref 8.9–10.3)
Chloride: 101 mmol/L (ref 98–111)
Creatinine, Ser: 0.92 mg/dL (ref 0.44–1.00)
GFR, Estimated: >60 mL/min (ref >60)
Glucose, Bld: 173 mg/dL — ABNORMAL HIGH (ref 70–99)
Potassium: 4.2 mmol/L (ref 3.5–5.1)
Sodium: 135 mmol/L (ref 135–145)
Total Bilirubin: 0.3 mg/dL (ref 0.3–1.2)
Total Protein: 4.9 g/dL — ABNORMAL LOW (ref 6.5–8.1)

## 2020-07-20 LAB — CBC WITH DIFFERENTIAL/PLATELET
Abs Immature Granulocytes: 0.03 10*3/uL (ref 0.00–0.07)
Basophils Absolute: 0 10*3/uL (ref 0.0–0.1)
Basophils Relative: 0 %
Eosinophils Absolute: 0 10*3/uL (ref 0.0–0.5)
Eosinophils Relative: 0 %
HCT: 40.3 % (ref 36.0–46.0)
Hemoglobin: 12.7 g/dL (ref 12.0–15.0)
Immature Granulocytes: 1 %
Lymphocytes Relative: 7 %
Lymphs Abs: 0.4 10*3/uL — ABNORMAL LOW (ref 0.7–4.0)
MCH: 31.3 pg (ref 26.0–34.0)
MCHC: 31.5 g/dL (ref 30.0–36.0)
MCV: 99.3 fL (ref 80.0–100.0)
Monocytes Absolute: 0.4 10*3/uL (ref 0.1–1.0)
Monocytes Relative: 6 %
Neutro Abs: 5 10*3/uL (ref 1.7–7.7)
Neutrophils Relative %: 86 %
Platelets: 199 10*3/uL (ref 150–400)
RBC: 4.06 MIL/uL (ref 3.87–5.11)
RDW: 14.5 % (ref 11.5–15.5)
WBC: 5.8 10*3/uL (ref 4.0–10.5)
nRBC: 0 % (ref 0.0–0.2)

## 2020-07-20 MED ORDER — ACETAMINOPHEN 500 MG PO TABS
500.0000 mg | ORAL_TABLET | Freq: Four times a day (QID) | ORAL | Status: DC
Start: 2020-07-20 — End: 2020-07-23
  Administered 2020-07-20 – 2020-07-22 (×8): 500 mg via ORAL
  Filled 2020-07-20 (×7): qty 1

## 2020-07-20 NOTE — Progress Notes (Signed)
Subjective: 1 Day Post-Op s/p Procedure(s): CANNULATED HIP PINNING   Patient states pain is well controlled. Denies chest pain, shortness of breath, abdominal pain, nausea, or vomiting. No other complaints.    Objective:  PE: VITALS:   Vitals:   07/19/20 2331 07/20/20 0310 07/20/20 0335 07/20/20 0747  BP: 117/70  127/75 127/79  Pulse: 64 65 64 70  Resp: 16  16 17   Temp: 97.9 F (36.6 C)  98 F (36.7 C) 97.8 F (36.6 C)  TempSrc: Oral  Oral Oral  SpO2: 94% 100% 97% 97%  Weight:      Height:       General: elderly female, sitting up in bed, in no acute distress ABD soft Neurovascular intact Sensation intact distally Intact pulses distally Dorsiflexion/Plantar flexion intact Incision: dressing C/D/I Compartment soft  LABS  Results for orders placed or performed during the hospital encounter of 07/18/20 (from the past 24 hour(s))  CBC with Differential/Platelet     Status: Abnormal   Collection Time: 07/20/20  2:47 AM  Result Value Ref Range   WBC 5.8 4.0 - 10.5 K/uL   RBC 4.06 3.87 - 5.11 MIL/uL   Hemoglobin 12.7 12.0 - 15.0 g/dL   HCT 40.3 36 - 46 %   MCV 99.3 80.0 - 100.0 fL   MCH 31.3 26.0 - 34.0 pg   MCHC 31.5 30.0 - 36.0 g/dL   RDW 14.5 11.5 - 15.5 %   Platelets 199 150 - 400 K/uL   nRBC 0.0 0.0 - 0.2 %   Neutrophils Relative % 86 %   Neutro Abs 5.0 1.7 - 7.7 K/uL   Lymphocytes Relative 7 %   Lymphs Abs 0.4 (L) 0.7 - 4.0 K/uL   Monocytes Relative 6 %   Monocytes Absolute 0.4 0.1 - 1.0 K/uL   Eosinophils Relative 0 %   Eosinophils Absolute 0.0 0.0 - 0.5 K/uL   Basophils Relative 0 %   Basophils Absolute 0.0 0.0 - 0.1 K/uL   Immature Granulocytes 1 %   Abs Immature Granulocytes 0.03 0.00 - 0.07 K/uL  Comprehensive metabolic panel     Status: Abnormal   Collection Time: 07/20/20  2:47 AM  Result Value Ref Range   Sodium 135 135 - 145 mmol/L   Potassium 4.2 3.5 - 5.1 mmol/L   Chloride 101 98 - 111 mmol/L   CO2 24 22 - 32 mmol/L   Glucose, Bld  173 (H) 70 - 99 mg/dL   BUN 13 8 - 23 mg/dL   Creatinine, Ser 0.92 0.44 - 1.00 mg/dL   Calcium 8.2 (L) 8.9 - 10.3 mg/dL   Total Protein 4.9 (L) 6.5 - 8.1 g/dL   Albumin 2.5 (L) 3.5 - 5.0 g/dL   AST 23 15 - 41 U/L   ALT 23 0 - 44 U/L   Alkaline Phosphatase 44 38 - 126 U/L   Total Bilirubin 0.3 0.3 - 1.2 mg/dL   GFR, Estimated >60 >60 mL/min   Anion gap 10 5 - 15    DG Pelvis 1-2 Views  Result Date: 07/18/2020 CLINICAL DATA:  Fall left femur pain EXAM: PELVIS - 1-2 VIEW COMPARISON:  None. FINDINGS: There is a slightly impacted nondisplaced transcervical left femoral neck fracture. No other definite fracture is identified. Moderate bilateral hip osteoarthritis is seen. IMPRESSION: Slightly impacted nondisplaced left transcervical femoral neck fracture. Electronically Signed   By: Prudencio Pair M.D.   On: 07/18/2020 16:35   DG C-Arm 1-60 Min  Result Date:  07/19/2020 CLINICAL DATA:  Left hip pinning, left hip fracture EXAM: OPERATIVE LEFT HIP (WITH PELVIS IF PERFORMED) 2 VIEWS TECHNIQUE: Fluoroscopic spot image(s) were submitted for interpretation post-operatively. COMPARISON:  07/18/2020 FINDINGS: Two fluoroscopic images are obtained during performance of the procedure and are provided for interpretation only. Three cannulated screws are seen traversing the subcapital left femoral neck fracture seen previously, with near anatomic alignment. FLUOROSCOPY TIME:  32 seconds IMPRESSION: 1. Pinning of a left hip subcapital femoral neck fracture with near anatomic alignment. Electronically Signed   By: Randa Ngo M.D.   On: 07/19/2020 16:59   DG HIP OPERATIVE UNILAT W OR W/O PELVIS LEFT  Result Date: 07/19/2020 CLINICAL DATA:  Left hip pinning, left hip fracture EXAM: OPERATIVE LEFT HIP (WITH PELVIS IF PERFORMED) 2 VIEWS TECHNIQUE: Fluoroscopic spot image(s) were submitted for interpretation post-operatively. COMPARISON:  07/18/2020 FINDINGS: Two fluoroscopic images are obtained during performance of  the procedure and are provided for interpretation only. Three cannulated screws are seen traversing the subcapital left femoral neck fracture seen previously, with near anatomic alignment. FLUOROSCOPY TIME:  32 seconds IMPRESSION: 1. Pinning of a left hip subcapital femoral neck fracture with near anatomic alignment. Electronically Signed   By: Randa Ngo M.D.   On: 07/19/2020 16:59   DG FEMUR MIN 2 VIEWS LEFT  Result Date: 07/18/2020 CLINICAL DATA:  Fall femur pain EXAM: LEFT FEMUR 2 VIEWS COMPARISON:  None. FINDINGS: There is a nondisplaced transcervical femoral neck fracture which is slightly impacted. The femoral head is still well seated within the acetabulum. Overlying soft tissue swelling is seen. IMPRESSION: Nondisplaced transcervical femoral neck fracture. Electronically Signed   By: Prudencio Pair M.D.   On: 07/18/2020 16:33    Assessment/Plan: valgus impacted left hip fracture 1 Day Post-Op s/p Procedure(s): CANNULATED HIP PINNING  Weightbearing: WBAT LLE, up with therapy Insicional and dressing care: reinforce as needed VTE prophylaxis: lovenox x 30 days Pain control: continue current regimen, limit narcotics as much as possible Follow - up plan: Follow up with Dr. Mardelle Matte in 2 weeks Dispo: pending PT eval  Contact information:   Weekdays 8-5 Merlene Pulling, PA-C [phone] A fter hours and holidays please check [email or web address] for group call information for [facility]  Ventura Bruns 07/20/2020, 10:28 AM

## 2020-07-20 NOTE — TOC CAGE-AID Note (Signed)
Transition of Care [facility]) - CAGE-AID Screening   Patient Details  Name: Andrea Jackson MRN: [id number] Date of Birth: 1933-10-01  [facility] CM/SW Contact:    Emeterio Reeve, [place] Phone Number: 07/20/2020, 4:36 PM   Clinical Narrative:  CSW met with pt at bedside. CSW introduced self and explained role at the hospital.  Pt denies alcohol use. Pt denies substance use. Pt did not need any resources at this time.    CAGE-AID Screening:    Have You Ever Felt You Ought to Cut Down on Your Drinking or Drug Use?: No Have People Annoyed You By Critizing Your Drinking Or Drug Use?: No Have You Felt Bad Or Guilty About Your Drinking Or Drug Use?: No Have You Ever Had a Drink or Used Drugs First Thing In The Morning to Steady Your Nerves or to Get Rid of a Hangover?: No CAGE-AID Score: 0  Substance Abuse Education Offered: Yes    Blima Ledger, [place] Social Worker [phone]

## 2020-07-20 NOTE — Progress Notes (Signed)
Physical Therapy Treatment Patient Details Name: Andrea Jackson MRN: [id number] DOB: [birth year] Today's Date: 07/20/2020    History of Present Illness Andrea Jackson is a 86 y.o. female with medical history significant of aortic dissection, BPPV, diverticulitis, chronic constipation, chronic abdominal pain, CAD status post angioplasty in 2010, GERD, hypothyroidism who presents after a fall with left hip pain.  Patient states that she was out at her mailbox and she bent down to pick up a piece of paper and as she was turning she fell and hit her left hip.    PT Comments    Patient received in recliner. Agrees to walk again today. She requires min assist for sit to stand from recliner with cues for safe hand placement. She is able to ambulate 60 feet with RW and min guard. Requires min assist to return to supine from sitting edge of bed. Patient is making good progress.  She is hopeful to return home at discharge. Granddaughter uncertain about this at this time. Patient will continue to benefit from skilled PT while here to improve functional mobility, safety and independence.       Follow Up Recommendations  SNF;Supervision/Assistance - 24 hour     Equipment Recommendations  None recommended by PT    Recommendations for Other Services       Precautions / Restrictions Precautions Precautions: Fall Restrictions Weight Bearing Restrictions: Yes LLE Weight Bearing: Weight bearing as tolerated    Mobility  Bed Mobility Overal bed mobility: Needs Assistance Bed Mobility: Sit to Supine     Supine to sit: Min assist;HOB elevated Sit to supine: Min assist   General bed mobility comments: assist to bring legs back up onto bed.  Transfers Overall transfer level: Needs assistance Equipment used: Rolling walker (2 wheeled) Transfers: Sit to/from Stand Sit to Stand: Min assist         General transfer comment: cues for safe hand placement with transfers. Min assist to power up to  standing  Ambulation/Gait Ambulation/Gait assistance: Min guard Gait Distance (Feet): 60 Feet Assistive device: Rolling walker (2 wheeled) Gait Pattern/deviations: Step-to pattern;Decreased stride length;Decreased weight shift to left Gait velocity: decreased   General Gait Details: patient requires RW for balance and due to pain in left LE. Cues for sequencing. Min guard for balance and safety   Stairs             Wheelchair Mobility    Modified Rankin (Stroke Patients Only)       Balance Overall balance assessment: Needs assistance Sitting-balance support: Feet supported Sitting balance-Leahy Scale: Fair Sitting balance - Comments: min guard   Standing balance support: Bilateral upper extremity supported;During functional activity Standing balance-Leahy Scale: Poor Standing balance comment: reliant on RW and min guard for safety                            Cognition Arousal/Alertness: Awake/alert Behavior During Therapy: WFL for tasks assessed/performed Overall Cognitive Status: Within Functional Limits for tasks assessed                                        Exercises Other Exercises Other Exercises: L LE: ap, hip abd/add( min A), LAQ x 10 reps each    General Comments        Pertinent Vitals/Pain Pain Assessment: 0-10 Pain Score: 5  Pain Location: L hip  Pain Descriptors / Indicators: Discomfort;Guarding;Grimacing;Operative site guarding Pain Intervention(s): Monitored during session;Repositioned    Home Living Family/patient expects to be discharged to:: Private residence Living Arrangements: Children Available Help at Discharge: Family;Available 24 hours/day Type of Home: House Home Access: Level entry   Home Layout: One level Home Equipment: Walker - 2 wheels;Grab bars - tub/shower      Prior Function Level of Independence: Independent      Comments: patient reports she does not use AD   PT Goals (current  goals can now be found in the care plan section) Acute Rehab PT Goals Patient Stated Goal: to hopefully go home at discharge PT Goal Formulation: With patient Time For Goal Achievement: 08/03/20 Potential to Achieve Goals: Good    Frequency    Min 3X/week      PT Plan Current plan remains appropriate    Co-evaluation              AM-PAC PT "6 Clicks" Mobility   Outcome Measure  Help needed turning from your back to your side while in a flat bed without using bedrails?: A Little Help needed moving from lying on your back to sitting on the side of a flat bed without using bedrails?: A Little Help needed moving to and from a bed to a chair (including a wheelchair)?: A Lot Help needed standing up from a chair using your arms (e.g., wheelchair or bedside chair)?: A Lot Help needed to walk in hospital room?: A Little Help needed climbing 3-5 steps with a railing? : A Lot 6 Click Score: 15    End of Session Equipment Utilized During Treatment: Gait belt Activity Tolerance: Patient tolerated treatment well Patient left: in bed;with bed alarm set Nurse Communication: Mobility status PT Visit Diagnosis: Unsteadiness on feet (R26.81);Other abnormalities of gait and mobility (R26.89);Pain;Difficulty in walking, not elsewhere classified (R26.2);History of falling (Z91.81) Pain - Right/Left: Left Pain - part of body: Hip     Time: 1345-1404 PT Time Calculation (min) (ACUTE ONLY): 19 min  Charges:  $Gait Training: 8-22 mins                     Anaia Frith, PT, GCS 07/20/20,2:15 PM

## 2020-07-20 NOTE — Evaluation (Signed)
Physical Therapy Evaluation Patient Details Name: Andrea Jackson MRN: [id number] DOB: [birth year] Today's Date: 07/20/2020   History of Present Illness  Andrea Jackson is a 86 y.o. female with medical history significant of aortic dissection, BPPV, diverticulitis, chronic constipation, chronic abdominal pain, CAD status post angioplasty in 2010, GERD, hypothyroidism who presents after a fall with left hip pain.  Patient states that she was out at her mailbox and she bent down to pick up a piece of paper and as she was turning she fell and hit her left hip.  Clinical Impression  Patient received in bed, she is agreeable to PT session. Reports 5/10 pain in left hip with movement. She is alert, pleasant. Patient requires min assist for supine to sit. Mainly to raise trunk to seated position. She is able to slide L LE in bed. She requires min A for sit to stand from elevated bed. Cues for hand placement and safety. She is able to walk 4 feet to recliner then turn and sit down. Cues for sequencing and walker use. She is motivated to improve and return to independence. Patient will continue to benefit from skilled PT while here to improve functional mobility, strength and safety.     Follow Up Recommendations SNF;Supervision/Assistance - 24 hour    Equipment Recommendations  None recommended by PT    Recommendations for Other Services       Precautions / Restrictions Precautions Precautions: Fall Restrictions Weight Bearing Restrictions: Yes LLE Weight Bearing: Weight bearing as tolerated      Mobility  Bed Mobility Overal bed mobility: Needs Assistance Bed Mobility: Supine to Sit     Supine to sit: Min assist;HOB elevated          Transfers Overall transfer level: Needs assistance Equipment used: Rolling walker (2 wheeled) Transfers: Sit to/from Stand Sit to Stand: Min assist;From elevated surface         General transfer comment: patient requires cues for hand placement, min  assist to power up due to pain in left hip  Ambulation/Gait Ambulation/Gait assistance: Min guard Gait Distance (Feet): 6 Feet Assistive device: Rolling walker (2 wheeled) Gait Pattern/deviations: Step-to pattern Gait velocity: decreased   General Gait Details: patient requires RW for balance and due to pain in left LE. Cues for sequencing. Min guard for balance and safety  Stairs            Wheelchair Mobility    Modified Rankin (Stroke Patients Only)       Balance Overall balance assessment: Needs assistance Sitting-balance support: Feet supported Sitting balance-Leahy Scale: Fair Sitting balance - Comments: min guard   Standing balance support: Bilateral upper extremity supported;During functional activity Standing balance-Leahy Scale: Poor Standing balance comment: reliant on RW and min guard for safety                             Pertinent Vitals/Pain Pain Assessment: 0-10 Pain Score: 5  Pain Location: L hip Pain Descriptors / Indicators: Discomfort;Guarding;Grimacing;Operative site guarding Pain Intervention(s): Limited activity within patient's tolerance;Monitored during session;Repositioned    Home Living Family/patient expects to be discharged to:: Private residence Living Arrangements: Children Available Help at Discharge: Family;Available 24 hours/day Type of Home: House Home Access: Level entry     Home Layout: One level Home Equipment: Walker - 2 wheels;Grab bars - tub/shower      Prior Function Level of Independence: Independent         Comments:  patient reports she does not use AD     Hand Dominance        Extremity/Trunk Assessment   Upper Extremity Assessment Upper Extremity Assessment: Overall WFL for tasks assessed    Lower Extremity Assessment Lower Extremity Assessment: LLE deficits/detail LLE: Unable to fully assess due to pain LLE Sensation: WNL LLE Coordination: decreased gross motor    Cervical /  Trunk Assessment Cervical / Trunk Assessment: Normal  Communication   Communication: No difficulties  Cognition Arousal/Alertness: Awake/alert Behavior During Therapy: WFL for tasks assessed/performed Overall Cognitive Status: Within Functional Limits for tasks assessed                                        General Comments      Exercises Other Exercises Other Exercises: L LE: ap, hip abd/add( min A), LAQ x 10 reps each   Assessment/Plan    PT Assessment Patient needs continued PT services  PT Problem List Decreased strength;Decreased mobility;Decreased activity tolerance;Decreased balance;Pain;Decreased safety awareness       PT Treatment Interventions DME instruction;Therapeutic activities;Gait training;Therapeutic exercise;Patient/family education;Functional mobility training;Balance training    PT Goals (Current goals can be found in the Care Plan section)  Acute Rehab PT Goals Patient Stated Goal: to hopefully go home at discharge PT Goal Formulation: With patient Time For Goal Achievement: 08/03/20 Potential to Achieve Goals: Good    Frequency Min 3X/week   Barriers to discharge   patient wants to go home at discharge, lives with a grandchild who works nights and is home during the day. Also has daughter who may be able to provide assist.    Co-evaluation               AM-PAC PT "6 Clicks" Mobility  Outcome Measure Help needed turning from your back to your side while in a flat bed without using bedrails?: A Little Help needed moving from lying on your back to sitting on the side of a flat bed without using bedrails?: A Little Help needed moving to and from a bed to a chair (including a wheelchair)?: A Lot Help needed standing up from a chair using your arms (e.g., wheelchair or bedside chair)?: A Lot Help needed to walk in hospital room?: A Lot Help needed climbing 3-5 steps with a railing? : A Lot 6 Click Score: 14    End of Session  Equipment Utilized During Treatment: Gait belt Activity Tolerance: Patient tolerated treatment well;Patient limited by pain Patient left: in chair;with call bell/phone within reach;with chair alarm set Nurse Communication: Mobility status PT Visit Diagnosis: Unsteadiness on feet (R26.81);Other abnormalities of gait and mobility (R26.89);Pain;Difficulty in walking, not elsewhere classified (R26.2);History of falling (Z91.81) Pain - Right/Left: Left Pain - part of body: Hip    Time: 3094-0768 PT Time Calculation (min) (ACUTE ONLY): 31 min   Charges:   PT Evaluation $PT Eval Moderate Complexity: 1 Mod PT Treatments $Gait Training: 8-22 mins        Karlen Barbar, PT, GCS 07/20/20,11:29 AM

## 2020-07-20 NOTE — Progress Notes (Signed)
PROGRESS NOTE    Andrea Jackson  DXA:[id number] DOB: 11/17/1933 DOA: 07/18/2020 PCP: Caryl Bis, MD  Brief Narrative:  86 year old white female CAD angioplasty ICA 2010 not on aspirin secondary to small bowel source of recurrent GI bleed--?  Crohn's Pancreatic mass is no longer found on imaging Prior history of aortic dissection Prior esophageal web status post dilatation Iron deficiency anemia follows with Dr. Delton Coombes and gets intermittent iron infusion Small bowel obstructions in 2013 and 2016 HTN HLD BPV  Presented from home with accidental fall with immediate left hip pain Found to have nondisplaced transcervical fracture left hip Mild AKI BUN/creatinine up from 20/1.0 up to 18/1.2    Assessment & Plan:   Principal Problem:   Nondisplaced fracture of neck of femur ([place]) Active Problems:   Hypothyroidism   Essential hypertension, benign   CORONARY ATHEROSCLEROSIS NATIVE CORONARY ARTERY   Chronic constipation   GERD (gastroesophageal reflux disease)   AKI (acute kidney injury) ([place])   1. Left nondisplaced transcervical femoral neck fracture a. Underwent left cannulated hip pinning 11/7 b. Pain management currently Tylenol 500 every 6 and tramadol every 6 as needed 50 mg c. At baseline takes Flexeril which was continued d. Weightbearing as tolerated, Lovenox for 30 days post discharge and outpatient follow-up with orthopedics 2. AKI a. Improved slightly we will start saline at 50 cc an hour b. We will discontinue completely Cozaar 50 c. Improved 3. HTN with CAD and angioplasty 2010 a. Continue Imdur 30 daily, HCTZ 12.5, metoprolol XL 12.5 daily b. Cozaar PTA 100 mg but has been discontinued completely 4. Reflux, prior small bowel obstructions, prior esophageal web a. Outpatient follow-up with GI no concerns at this time b. Continue Protonix 40 daily 5. Hypothyroid a. Continue Synthroid 150 every morning recheck TSH probably in about 3 weeks 6. Chronic  constipation 7. Prior aortic dissection a. Outpatient follow-up and imaging as per cardiology who sees her  DVT prophylaxis: Defer to orthopedics Code Status: DNR confirmed Family Communication: Called daughter [name] [phone] and updated fully-patient is wishing to go home told [name] that patient may benefit from skilled care short-term we await therapy input Disposition:   Status is: Inpatient  Remains inpatient appropriate because:Ongoing active pain requiring inpatient pain management and Altered mental status   Dispo: The patient is from: Home              Anticipated d/c is to: SNF              Anticipated d/c date is: 1 day              Patient currently is not medically stable to d/c.       Consultants:   Orthopedics  Procedures: None as yet  Antimicrobials: None   Subjective:  Awake alert states pain 5/10 Tells me overall okay and eating and drinking without issue  Objective: Vitals:   07/19/20 2331 07/20/20 0310 07/20/20 0335 07/20/20 0747  BP: 117/70  127/75 127/79  Pulse: 64 65 64 70  Resp: 16  16 17   Temp: 97.9 F (36.6 C)  98 F (36.7 C) 97.8 F (36.6 C)  TempSrc: Oral  Oral Oral  SpO2: 94% 100% 97% 97%  Weight:      Height:        Intake/Output Summary (Last 24 hours) at 07/20/2020 1031 Last data filed at 07/20/2020 0748 Gross per 24 hour  Intake 1915.83 ml  Output 520 ml  Net 1395.83 ml   [employer]  07/19/20 0720  Weight: 54.4 kg    Examination: Pleasant alert coherent S1-S2 no murmur no rub no gallop Chest clear no added sound  abdomen is postoperative changes Left hip externally rotated neurologically intact no focal deficit  Data Reviewed: I have personally reviewed following labs and imaging studies  BUNs/creatinine 18/1.22-->-13/0.9 LFTs normal Hemoglobin 12. 7, WBC 5.8   Radiology Studies: DG Pelvis 1-2 Views  Result Date: 07/18/2020 CLINICAL DATA:  Fall left femur pain EXAM: PELVIS - 1-2 VIEW COMPARISON:   None. FINDINGS: There is a slightly impacted nondisplaced transcervical left femoral neck fracture. No other definite fracture is identified. Moderate bilateral hip osteoarthritis is seen. IMPRESSION: Slightly impacted nondisplaced left transcervical femoral neck fracture. Electronically Signed   By: Prudencio Pair M.D.   On: 07/18/2020 16:35   DG C-Arm 1-60 Min  Result Date: 07/19/2020 CLINICAL DATA:  Left hip pinning, left hip fracture EXAM: OPERATIVE LEFT HIP (WITH PELVIS IF PERFORMED) 2 VIEWS TECHNIQUE: Fluoroscopic spot image(s) were submitted for interpretation post-operatively. COMPARISON:  07/18/2020 FINDINGS: Two fluoroscopic images are obtained during performance of the procedure and are provided for interpretation only. Three cannulated screws are seen traversing the subcapital left femoral neck fracture seen previously, with near anatomic alignment. FLUOROSCOPY TIME:  32 seconds IMPRESSION: 1. Pinning of a left hip subcapital femoral neck fracture with near anatomic alignment. Electronically Signed   By: Randa Ngo M.D.   On: 07/19/2020 16:59   DG HIP OPERATIVE UNILAT W OR W/O PELVIS LEFT  Result Date: 07/19/2020 CLINICAL DATA:  Left hip pinning, left hip fracture EXAM: OPERATIVE LEFT HIP (WITH PELVIS IF PERFORMED) 2 VIEWS TECHNIQUE: Fluoroscopic spot image(s) were submitted for interpretation post-operatively. COMPARISON:  07/18/2020 FINDINGS: Two fluoroscopic images are obtained during performance of the procedure and are provided for interpretation only. Three cannulated screws are seen traversing the subcapital left femoral neck fracture seen previously, with near anatomic alignment. FLUOROSCOPY TIME:  32 seconds IMPRESSION: 1. Pinning of a left hip subcapital femoral neck fracture with near anatomic alignment. Electronically Signed   By: Randa Ngo M.D.   On: 07/19/2020 16:59   DG FEMUR MIN 2 VIEWS LEFT  Result Date: 07/18/2020 CLINICAL DATA:  Fall femur pain EXAM: LEFT FEMUR 2  VIEWS COMPARISON:  None. FINDINGS: There is a nondisplaced transcervical femoral neck fracture which is slightly impacted. The femoral head is still well seated within the acetabulum. Overlying soft tissue swelling is seen. IMPRESSION: Nondisplaced transcervical femoral neck fracture. Electronically Signed   By: Prudencio Pair M.D.   On: 07/18/2020 16:33     Scheduled Meds: . acetaminophen  500 mg Oral Q6H  . cholecalciferol  1,000 Units Oral Daily  . cycloSPORINE  1 drop Both Eyes QPM  . enoxaparin (LOVENOX) injection  40 mg Subcutaneous Q24H  . ferrous sulfate  325 mg Oral TID PC  . hydrochlorothiazide  12.5 mg Oral Daily  . isosorbide mononitrate  15 mg Oral Daily  . levothyroxine  175 mcg Oral QAC breakfast  . metoprolol succinate  12.5 mg Oral Daily  . mupirocin ointment  1 application Nasal BID  . pantoprazole  40 mg Oral Daily   Continuous Infusions: . sodium chloride 50 mL/hr at 07/19/20 1019     LOS: 2 days    Time spent: 22 minutes  Nita Sells, MD [facility] To contact the attending provider between 7A-7P or the covering provider during after hours 7P-7A, please log into the web site [email or web address] and access using universal [place] password  for that web site. If you do not have the password, please call the hospital operator.  07/20/2020, 10:31 AM

## 2020-07-21 DIAGNOSIS — N179 Acute kidney failure, unspecified: Secondary | ICD-10-CM

## 2020-07-21 DIAGNOSIS — I10 Essential (primary) hypertension: Secondary | ICD-10-CM

## 2020-07-21 DIAGNOSIS — W19XXXA Unspecified fall, initial encounter: Secondary | ICD-10-CM

## 2020-07-21 DIAGNOSIS — S72002A Fracture of unspecified part of neck of left femur, initial encounter for closed fracture: Secondary | ICD-10-CM

## 2020-07-21 DIAGNOSIS — K5909 Other constipation: Secondary | ICD-10-CM

## 2020-07-21 LAB — COMPREHENSIVE METABOLIC PANEL
ALT: 11 U/L (ref 0–44)
AST: 18 U/L (ref 15–41)
Albumin: 2.4 g/dL — ABNORMAL LOW (ref 3.5–5.0)
Alkaline Phosphatase: 40 U/L (ref 38–126)
Anion gap: 7 (ref 5–15)
BUN: 16 mg/dL (ref 8–23)
CO2: 28 mmol/L (ref 22–32)
Calcium: 8.4 mg/dL — ABNORMAL LOW (ref 8.9–10.3)
Chloride: 104 mmol/L (ref 98–111)
Creatinine, Ser: 1.08 mg/dL — ABNORMAL HIGH (ref 0.44–1.00)
GFR, Estimated: 51 mL/min — ABNORMAL LOW (ref >60)
Glucose, Bld: 107 mg/dL — ABNORMAL HIGH (ref 70–99)
Potassium: 4.1 mmol/L (ref 3.5–5.1)
Sodium: 139 mmol/L (ref 135–145)
Total Bilirubin: 0.4 mg/dL (ref 0.3–1.2)
Total Protein: 4.8 g/dL — ABNORMAL LOW (ref 6.5–8.1)

## 2020-07-21 LAB — CBC WITH DIFFERENTIAL/PLATELET
Abs Immature Granulocytes: 0.01 10*3/uL (ref 0.00–0.07)
Basophils Absolute: 0 10*3/uL (ref 0.0–0.1)
Basophils Relative: 1 %
Eosinophils Absolute: 0.1 10*3/uL (ref 0.0–0.5)
Eosinophils Relative: 2 %
HCT: 35.7 % — ABNORMAL LOW (ref 36.0–46.0)
Hemoglobin: 11.2 g/dL — ABNORMAL LOW (ref 12.0–15.0)
Immature Granulocytes: 0 %
Lymphocytes Relative: 12 %
Lymphs Abs: 0.7 10*3/uL (ref 0.7–4.0)
MCH: 30.5 pg (ref 26.0–34.0)
MCHC: 31.4 g/dL (ref 30.0–36.0)
MCV: 97.3 fL (ref 80.0–100.0)
Monocytes Absolute: 0.6 10*3/uL (ref 0.1–1.0)
Monocytes Relative: 11 %
Neutro Abs: 4 10*3/uL (ref 1.7–7.7)
Neutrophils Relative %: 74 %
Platelets: 208 10*3/uL (ref 150–400)
RBC: 3.67 MIL/uL — ABNORMAL LOW (ref 3.87–5.11)
RDW: 14.1 % (ref 11.5–15.5)
WBC: 5.4 10*3/uL (ref 4.0–10.5)
nRBC: 0 % (ref 0.0–0.2)

## 2020-07-21 MED ORDER — FERROUS SULFATE 325 (65 FE) MG PO TABS
325.0000 mg | ORAL_TABLET | Freq: Three times a day (TID) | ORAL | 3 refills | Status: DC
Start: 2020-07-21 — End: 2021-08-24

## 2020-07-21 MED ORDER — TRAMADOL HCL 50 MG PO TABS: 50.0000 mg | ORAL_TABLET | Freq: Four times a day (QID) | ORAL | 0 refills | Status: AC | PRN

## 2020-07-21 MED ORDER — LABETALOL HCL 5 MG/ML IV SOLN
10.0000 mg | Freq: Once | INTRAVENOUS | Status: AC
  Administered 2020-07-21: 10 mg via INTRAVENOUS
  Filled 2020-07-21: qty 4

## 2020-07-21 MED ORDER — ALPRAZOLAM 0.5 MG PO TABS: 0.5000 mg | ORAL_TABLET | Freq: Two times a day (BID) | ORAL | 0 refills | Status: AC | PRN

## 2020-07-21 MED ORDER — ACETAMINOPHEN 500 MG PO TABS
500.0000 mg | ORAL_TABLET | Freq: Four times a day (QID) | ORAL | 0 refills | Status: DC
Start: 2020-07-21 — End: 2021-03-11

## 2020-07-21 MED ORDER — ENOXAPARIN SODIUM 40 MG/0.4ML ~~LOC~~ SOLN: 40.0000 mg | SUBCUTANEOUS | 0 refills | Status: DC

## 2020-07-21 NOTE — Plan of Care (Signed)

## 2020-07-21 NOTE — Social Work (Addendum)
4:02- [employer] Andrea Jackson has been approved 07/21/20- 07/23/20, number has not yet been generated. Are coordinator is Lanier Ensign.  1:06-CSW started insurance auth with [place], reference number [id number]. Clinicals have been sent.   Emeterio Reeve, Latanya Presser, [place] Social Worker [phone]

## 2020-07-21 NOTE — NC FL2 (Signed)
[place] LEVEL OF CARE SCREENING TOOL     IDENTIFICATION  Patient Name: Andrea Jackson Birthdate: Jun 08, 1934 Sex: female Admission Date (Current Location): 07/18/2020  [facility] and [place] Number:  Herbalist and Address:  [facility] [place] [place] [address] [place], [place] [address]      Provider Number: [id number]  Attending Physician Name and Address:  Nita Sells, MD  Relative Name and Phone Number:       Current Level of Care: Hospital Recommended Level of Care: [place] Prior Approval Number:    Date Approved/Denied:   PASRR Number: [id number]  Discharge Plan: SNF    Current Diagnoses: Patient Active Problem List   Diagnosis Date Noted   Nondisplaced fracture of neck of femur ([place]) 07/18/2020   AKI (acute kidney injury) ([place]) 07/18/2020   BPPV (benign paroxysmal positional vertigo), left 07/02/2018   Dizziness 06/30/2018   Right lower quadrant abdominal mass 03/02/2018   Aortic dissection ([place]) 03/02/2018   Leukopenia 12/18/2017   GERD (gastroesophageal reflux disease) 02/15/2017   Pancreatic lesion 02/15/2017   Esophageal web 02/01/2017   Anorexia    Dysphagia 01/30/2017   Mucosal abnormality of stomach    Diverticulosis of colon without hemorrhage    Heme positive stool 07/27/2015   Esophageal dysphagia 07/27/2015   Near syncope 03/02/2015   IDA (iron deficiency anemia) 03/03/2014   Chronic generalized abdominal pain 11/15/2012   Chronic constipation 11/15/2012   Long term current use of non-steroidal anti-inflammatories (NSAID) 01/07/2011   Hemorrhage of gastrointestinal tract 01/28/2010   Essential hypertension, benign 09/18/2009   Hypothyroidism 07/31/2009   Iron deficiency anemia 07/29/2009   Mixed hyperlipidemia 10/21/2008   CORONARY ATHEROSCLEROSIS NATIVE CORONARY ARTERY 10/21/2008    Orientation RESPIRATION BLADDER Height & Weight     Self, Time,  Situation, Place  Normal Continent Weight: 54.4 kg Height:  5\' 4"  (162.6 cm)  BEHAVIORAL SYMPTOMS/MOOD NEUROLOGICAL BOWEL NUTRITION STATUS      Continent Diet (Refer to d/c summary)  AMBULATORY STATUS COMMUNICATION OF NEEDS Skin   Extensive Assist Verbally Surgical wounds (LEFT CANNULATED HIP PINNING, 11/7)                       Personal Care Assistance Level of Assistance  Bathing, Feeding, Dressing Bathing Assistance: Maximum assistance Feeding assistance: Independent Dressing Assistance: Maximum assistance     Functional Limitations Info  Sight, Hearing, Speech Sight Info: Adequate Hearing Info: Adequate Speech Info: Adequate    SPECIAL CARE FACTORS FREQUENCY  OT (By licensed OT), PT (By licensed PT)     PT Frequency: 5 x / week, evaluate and treat OT Frequency: 5 x / week, evaluate and treat            Contractures Contractures Info: Not present    Additional Factors Info  Code Status, Allergies Code Status Info: DNR Allergies Info: Demerol Meperidine, Celecoxib, Codeine, Oxycodone-acetaminophen, Statins           Current Medications (07/21/2020):  This is the current hospital active medication list Current Facility-Administered Medications  Medication Dose Route Frequency Provider Last Rate Last Admin   0.9 %  sodium chloride infusion   Intravenous Continuous Merlene Pulling K, PA-C 50 mL/hr at 07/20/20 1619 New Bag at 07/20/20 1619   acetaminophen (TYLENOL) tablet 500 mg  500 mg Oral Q6H Nita Sells, MD   500 mg at 07/21/20 0636   ALPRAZolam (XANAX) tablet 0.5 mg  0.5 mg Oral BID  PRN Merlene Pulling K, PA-C   0.5 mg at 07/20/20 2124   alum & mag hydroxide-simeth (MAALOX/MYLANTA) 200-200-20 MG/5ML suspension 30 mL  30 mL Oral Q4H PRN Merlene Pulling K, PA-C       bisacodyl (DULCOLAX) EC tablet 5 mg  5 mg Oral Daily PRN Merlene Pulling K, PA-C       cholecalciferol (VITAMIN D3) tablet 1,000 Units  1,000 Units Oral Daily Ventura Bruns, PA-C   1,000  Units at 07/21/20 0936   cyclobenzaprine (FLEXERIL) tablet 5 mg  5 mg Oral TID PRN Merlene Pulling K, PA-C   5 mg at 07/20/20 1623   cycloSPORINE (RESTASIS) 0.05 % ophthalmic emulsion 1 drop  1 drop Both Eyes QPM Merlene Pulling K, PA-C   1 drop at 07/20/20 1755   diclofenac sodium (VOLTAREN) 1 % transdermal gel 2 g  2 g Topical QID PRN Merlene Pulling K, PA-C       diphenhydrAMINE (BENADRYL) capsule 25 mg  25 mg Oral Q6H PRN Owens Shark, Blaine K, PA-C       enoxaparin (LOVENOX) injection 40 mg  40 mg Subcutaneous Q24H Brown, Blaine K, PA-C   40 mg at 07/21/20 0940   ferrous sulfate tablet 325 mg  325 mg Oral TID PC Brown, Blaine K, PA-C       hydrochlorothiazide (MICROZIDE) capsule 12.5 mg  12.5 mg Oral Daily Merlene Pulling K, PA-C   12.5 mg at 07/21/20 2229   isosorbide mononitrate (IMDUR) 24 hr tablet 15 mg  15 mg Oral Daily Ventura Bruns, PA-C   15 mg at 07/21/20 7989   levothyroxine (SYNTHROID) tablet 175 mcg  175 mcg Oral QAC breakfast Ventura Bruns, PA-C   175 mcg at 07/21/20 2119   loteprednol (LOTEMAX) 0.5 % ophthalmic suspension 2 drop  2 drop Both Eyes BID PRN Merlene Pulling K, PA-C       magnesium citrate solution 1 Bottle  1 Bottle Oral Once PRN Merlene Pulling K, PA-C       menthol-cetylpyridinium (CEPACOL) lozenge 3 mg  1 lozenge Oral PRN Merlene Pulling K, PA-C       Or   phenol (CHLORASEPTIC) mouth spray 1 spray  1 spray Mouth/Throat PRN Merlene Pulling K, PA-C       metoprolol succinate (TOPROL-XL) 24 hr tablet 12.5 mg  12.5 mg Oral Daily Owens Shark, Blaine K, PA-C   12.5 mg at 07/21/20 4174   mupirocin ointment (BACTROBAN) 2 % 1 application  1 application Nasal BID Merlene Pulling K, PA-C   1 application at 04/26/47 0940   ondansetron (ZOFRAN) tablet 4 mg  4 mg Oral Q6H PRN Merlene Pulling K, PA-C       Or   ondansetron [facility]) injection 4 mg  4 mg Intravenous Q6H PRN Owens Shark, Blaine K, PA-C       pantoprazole (PROTONIX) EC tablet 40 mg  40 mg Oral Daily Merlene Pulling K, PA-C   40 mg  at 07/21/20 0936   polyethylene glycol (MIRALAX / GLYCOLAX) packet 17 g  17 g Oral Daily PRN Merlene Pulling K, PA-C   17 g at 07/21/20 0636   traMADol (ULTRAM) tablet 50 mg  50 mg Oral Q6H PRN Merlene Pulling K, PA-C   50 mg at 07/20/20 1623     Discharge Medications: Please see discharge summary for a list of discharge medications.  Relevant Imaging Results:  Relevant Lab Results:   Additional Information SS#: [id number]  Sharin Mons, RN

## 2020-07-21 NOTE — TOC Initial Note (Addendum)
Transition of Care [facility]) - Initial/Assessment Note    Patient Details  Name: Andrea Jackson MRN: [id number] Date of Birth: 1934/02/15  Transition of Care [facility]) CM/SW Contact:    Sharin Mons, RN Phone Number: 07/21/2020, 11:03 AM  Clinical Narrative:                 Presents after a fall. Suffered femur fx. From home with grandson.       S/P LEFT CANNULATED HIP PINNING, 11/7  RNCM received consult for possible SNF placement at time of discharge. RNCM spoke with patient and pt's daughter [name] regarding PT recommendation of SNF placement at time of discharge. Patient's daughter  reported that she  is currently unable to care for patient at their home given patient's current physical needs and fall risk. Patient expressed understanding of PT recommendation and is agreeable to SNF placement at time of discharge. Patient reports preference for [facility]  SNF  . RNCM discussed insurance authorization process and provided Medicare SNF ratings list. Patient expressed being hopeful for rehab and to feel better soon. No further questions reported at this time. RNCM to continue to follow and assist with discharge planning needs.   Referrals for SNF placement faxed out....  Expected Discharge Plan: Skilled Nursing Facility Barriers to Discharge: Insurance Authorization, No SNF bed   Patient Goals and CMS Choice Patient states their goals for this hospitalization and ongoing recovery are:: To get better and get home CMS Medicare.gov Compare Post Acute Care list provided to:: Patient Choice offered to / list presented to : Patient  Expected Discharge Plan and Services Expected Discharge Plan: [place]   Discharge Planning Services: CM Consult   Living arrangements for the past 2 months: Single Family Home Expected Discharge Date: 07/21/20                           Prior Living Arrangements/Services Living arrangements for the past 2 months: Single Family  Home Lives with:: Adult Children                   Activities of Daily Living Home Assistive Devices/Equipment: Wheelchair, Environmental consultant (specify type) ADL Screening (condition at time of admission) Patient's cognitive ability adequate to safely complete daily activities?: Yes Is the patient deaf or have difficulty hearing?: No Does the patient have difficulty seeing, even when wearing glasses/contacts?: No Does the patient have difficulty concentrating, remembering, or making decisions?: No Patient able to express need for assistance with ADLs?: Yes Does the patient have difficulty dressing or bathing?: Yes Independently performs ADLs?: No Communication: Independent Dressing (OT): Needs assistance Is this a change from baseline?: Change from baseline, expected to last >3 days Grooming: Needs assistance Is this a change from baseline?: Change from baseline, expected to last >3 days Feeding: Independent Bathing: Needs assistance Is this a change from baseline?: Change from baseline, expected to last >3 days Toileting: Needs assistance Is this a change from baseline?: Change from baseline, expected to last >3days In/Out Bed: Needs assistance Is this a change from baseline?: Change from baseline, expected to last >3 days Walks in Home: Needs assistance Is this a change from baseline?: Change from baseline, expected to last >3 days Does the patient have difficulty walking or climbing stairs?: Yes Weakness of Legs: Left Weakness of Arms/Hands: None  Permission Sought/Granted                  Emotional Assessment  Admission diagnosis:  Pain [R52] Fall, initial encounter [W19.XXXA] Closed fracture of neck of left femur, initial encounter ([place]) [S72.002A] Nondisplaced fracture of neck of femur ([place]) [S72.009A] Patient Active Problem List   Diagnosis Date Noted  . Nondisplaced fracture of neck of femur ([place]) 07/18/2020  . AKI (acute kidney injury) ([place])  07/18/2020  . BPPV (benign paroxysmal positional vertigo), left 07/02/2018  . Dizziness 06/30/2018  . Right lower quadrant abdominal mass 03/02/2018  . Aortic dissection ([place]) 03/02/2018  . Leukopenia 12/18/2017  . GERD (gastroesophageal reflux disease) 02/15/2017  . Pancreatic lesion 02/15/2017  . Esophageal web 02/01/2017  . Anorexia   . Dysphagia 01/30/2017  . Mucosal abnormality of stomach   . Diverticulosis of colon without hemorrhage   . Heme positive stool 07/27/2015  . Esophageal dysphagia 07/27/2015  . Near syncope 03/02/2015  . IDA (iron deficiency anemia) 03/03/2014  . Chronic generalized abdominal pain 11/15/2012  . Chronic constipation 11/15/2012  . Long term current use of non-steroidal anti-inflammatories (NSAID) 01/07/2011  . Hemorrhage of gastrointestinal tract 01/28/2010  . Essential hypertension, benign 09/18/2009  . Hypothyroidism 07/31/2009  . Iron deficiency anemia 07/29/2009  . Mixed hyperlipidemia 10/21/2008  . CORONARY ATHEROSCLEROSIS NATIVE CORONARY ARTERY 10/21/2008   PCP:  Caryl Bis, MD Pharmacy:   [facility] [place] [facility], [place] [address] Phone: [phone] Fax: [phone]     Social Determinants of Health (SDOH) Interventions    Readmission Risk Interventions No flowsheet data found.

## 2020-07-21 NOTE — Plan of Care (Signed)

## 2020-07-21 NOTE — Discharge Summary (Addendum)
Physician Discharge Summary  RAKHI ROMAGNOLI QQV:[id number] DOB: 1933/11/19 DOA: 07/18/2020  PCP: Caryl Bis, MD  Admit date: 07/18/2020 Discharge date: 07/22/2020  Time spent: 23 minutes  Recommendations for Outpatient Follow-up:  1. Recommend CBC Chem-7 in about 1 week 2. Outpatient follow-up Dr. Mardelle Matte 2 weeks post discharge to document wound healing etc. 3. Lovenox until 12/9 weightbearing as tolerated on light 4. Note discontinuation this admission various diuretic and dosage adjustment of various blood pressure medications  5. will need outpatient TSH in about 1 month  Discharge Diagnoses:  Principal Problem:   Nondisplaced fracture of neck of femur ([place]) Active Problems:   Hypothyroidism   Essential hypertension, benign   CORONARY ATHEROSCLEROSIS NATIVE CORONARY ARTERY   Chronic constipation   GERD (gastroesophageal reflux disease)   AKI (acute kidney injury) ([place])   Discharge Condition: Improved  Diet recommendation: Heart healthy  Filed Weights   07/19/20 0720  Weight: 54.4 kg    History of present illness:  86 year old white female CAD angioplasty ICA 2010 not on aspirin secondary to small bowel source of recurrent GI bleed--?  Crohn's Pancreatic mass is no longer found on imaging Prior history of aortic dissection Prior esophageal web status post dilatation Iron deficiency anemia follows with Dr. Delton Coombes and gets intermittent iron infusion Small bowel obstructions in 2013 and 2016 HTN HLD BPV  Presented from home with accidental fall with immediate left hip pain Found to have nondisplaced transcervical fracture left hip Mild AKI BUN/creatinine up from 20/1.0 up to 18/1.2  Hospital Course:  1. Left nondisplaced transcervical femoral neck fracture a. Underwent left cannulated hip pinning 11/7 b. Pain management currently Tylenol 500 every 6 and tramadol every 6 as needed 50 mg c. At baseline takes Flexeril which was continued d. Weightbearing as  tolerated, Lovenox for 30 days post discharge and outpatient follow-up with orthopedics 2. AKI a. Improved slightly after IVF this admit b. We will discontinue completely Cozaar 50 on discharge and she may need other medications c. Improved 3. HTN with CAD and angioplasty 2010 a. Continue Imdur 30 daily, HCTZ 12.5, metoprolol XL 12.5 daily b. Amlodipine 5 mg daily, cardiologist or PCP to titrate 4. Reflux, prior small bowel obstructions, prior esophageal web a. Outpatient follow-up with GI no concerns at this time b. Continue Protonix 40 daily 5. Hypothyroid a. Continue Synthroid 150 every morning recheck TSH probably in about 3 weeks 6. Chronic constipation 7. Prior aortic dissection a. Outpatient follow-up and imaging as per cardiology who sees her  Procedures: *Left hip cannulation 11/7 Consultations:  Orthopedics Dr. Mardelle Matte   Microbiology 11/10 SARS coronavirus negative  Discharge Exam: Vitals:   07/22/20 0323 07/22/20 0722  BP: (!) 160/90 (!) 166/84  Pulse: 70 67  Resp: 18 16  Temp: 97.7 F (36.5 C) (!) 97.2 F (36.2 C)  SpO2: 97% 97%    General: Awake alert coherent no distress EOMI NCAT Cardiovascular: S1-S2 no murmur no rub gallop RRR Respiratory: Clinically clear no added sound no rales no rhonchi Abdomen soft no rebound no guarding   Discharge Instructions   Discharge Instructions    Diet - low sodium heart healthy   Complete by: As directed    Increase activity slowly   Complete by: As directed    No wound care   Complete by: As directed      Allergies as of 07/22/2020      Reactions   Demerol [meperidine] Swelling   Patient stated she had a "reaction" to Demerol. Swollen  lip.   Celecoxib Other (See Comments)   REACTION: hyper, couldn't eat or sleep   Codeine Itching   Oxycodone-acetaminophen Itching   Statins Other (See Comments)   Muscle aches, can not tolerate any of them per patient.        Medication List    STOP taking these  medications   losartan 100 MG tablet Commonly known as: COZAAR     TAKE these medications   acetaminophen 500 MG tablet Commonly known as: TYLENOL Take 1 tablet (500 mg total) by mouth every 6 (six) hours.   ALPRAZolam 0.5 MG tablet Commonly known as: XANAX Take 1 tablet (0.5 mg total) by mouth 2 (two) times daily as needed for anxiety or sleep. What changed: additional instructions   amLODipine 5 MG tablet Commonly known as: NORVASC Take 1 tablet (5 mg total) by mouth daily.   bisacodyl 5 MG EC tablet Commonly known as: DULCOLAX Take 5 mg by mouth daily as needed for moderate constipation.   Cholecalciferol 25 MCG (1000 UT) tablet cholecalciferol (vitamin D3) 25 mcg (1,000 unit) tablet  TAKE ONE TABLET BY MOUTH DAILY.   Vitamin D-3 125 MCG (5000 UT) Tabs Take 5,000 Units by mouth daily with breakfast.   Cyanocobalamin 2500 MCG Tabs Take 2,500 mcg by mouth daily with breakfast.   cyclobenzaprine 10 MG tablet Commonly known as: FLEXERIL Take 5 mg by mouth 3 (three) times daily as needed for muscle spasms.   diclofenac 75 MG EC tablet Commonly known as: VOLTAREN Take 75 mg by mouth daily with breakfast. Takes 1-2 times per day   diclofenac sodium 1 % Gel Commonly known as: VOLTAREN Apply 1 application topically 2 (two) times daily as needed (joint pain).   enoxaparin 40 MG/0.4ML injection Commonly known as: LOVENOX Inject 0.4 mLs (40 mg total) into the skin daily.   ergocalciferol 1.25 MG (50000 UT) capsule Commonly known as: VITAMIN D2 Take 1 capsule (50,000 Units total) by mouth once a week. What changed: when to take this   esomeprazole 40 MG capsule Commonly known as: NEXIUM Take 40 mg by mouth 2 (two) times daily as needed (acid reflux/heartburn).   ferrous sulfate 325 (65 FE) MG tablet Take 1 tablet (325 mg total) by mouth 3 (three) times daily after meals.   Fish Oil 1200 MG Caps Take 1,200 mg by mouth 2 (two) times daily.   hydrochlorothiazide  12.5 MG capsule Commonly known as: MICROZIDE Take 12.5 mg by mouth daily with breakfast.   isosorbide mononitrate 30 MG 24 hr tablet Commonly known as: IMDUR TAKE 1/2 TABLET BY MOUTH DAILY What changed:   how much to take  when to take this   levothyroxine 150 MCG tablet Commonly known as: SYNTHROID Take 150 mcg by mouth daily before breakfast. What changed: Another medication with the same name was removed. Continue taking this medication, and follow the directions you see here.   loratadine 10 MG tablet Commonly known as: CLARITIN Take 10 mg by mouth daily as needed (seasonal allergies).   Lotemax 0.5 % ophthalmic suspension Generic drug: loteprednol Place 2 drops into both eyes daily.   meclizine 12.5 MG tablet Commonly known as: ANTIVERT Take 1 tablet (12.5 mg total) by mouth 3 (three) times daily as needed for dizziness.   metoprolol succinate 25 MG 24 hr tablet Commonly known as: TOPROL-XL Take 0.5 tablets (12.5 mg total) by mouth daily. What changed:   how much to take  when to take this   nitroGLYCERIN 0.4 MG  SL tablet Commonly known as: NITROSTAT Place 0.4 mg under the tongue every 5 (five) minutes x 3 doses as needed for chest pain.   polyethylene glycol 17 g packet Commonly known as: MIRALAX / GLYCOLAX Take 17 g by mouth daily as needed for mild constipation.   promethazine 25 MG tablet Commonly known as: PHENERGAN Take 12.5-25 mg by mouth every 6 (six) hours as needed for nausea or vomiting.   Restasis 0.05 % ophthalmic emulsion Generic drug: cycloSPORINE Place 1 drop into both eyes 2 (two) times daily as needed (dry eyes).   traMADol 50 MG tablet Commonly known as: ULTRAM Take 1 tablet (50 mg total) by mouth every 6 (six) hours as needed for moderate pain or severe pain. What changed: reasons to take this      Allergies  Allergen Reactions  . Demerol [Meperidine] Swelling    Patient stated she had a "reaction" to Demerol. Swollen lip.  .  Celecoxib Other (See Comments)    REACTION: hyper, couldn't eat or sleep  . Codeine Itching  . Oxycodone-Acetaminophen Itching  . Statins Other (See Comments)    Muscle aches, can not tolerate any of them per patient.      Contact information for follow-up providers    Marchia Bond, MD. Schedule an appointment as soon as possible for a visit in 2 weeks.   Specialty: Orthopedic Surgery Contact information: [place] [address] [place] [address] [phone]        [facility] Follow up.   Specialty: [place] Why: home health services will be provided by [facility] [place] information: [place] [address] [phone]            Contact information for after-discharge care    Destination    [place] [facility]erred SNF .   Service: Skilled Nursing Contact information: [address] [place] [phone]                   The results of significant diagnostics from this hospitalization (including imaging, microbiology, ancillary and laboratory) are listed below for reference.    Significant Diagnostic Studies: DG Pelvis 1-2 Views  Result Date: 07/18/2020 CLINICAL DATA:  Fall left femur pain EXAM: PELVIS - 1-2 VIEW COMPARISON:  None. FINDINGS: There is a slightly impacted nondisplaced transcervical left femoral neck fracture. No other definite fracture is identified. Moderate bilateral hip osteoarthritis is seen. IMPRESSION: Slightly impacted nondisplaced left transcervical femoral neck fracture. Electronically Signed   By: Prudencio Pair M.D.   On: 07/18/2020 16:35   DG C-Arm 1-60 Min  Result Date: 07/19/2020 CLINICAL DATA:  Left hip pinning, left hip fracture EXAM: OPERATIVE LEFT HIP (WITH PELVIS IF PERFORMED) 2 VIEWS TECHNIQUE: Fluoroscopic spot image(s) were submitted for interpretation post-operatively. COMPARISON:   07/18/2020 FINDINGS: Two fluoroscopic images are obtained during performance of the procedure and are provided for interpretation only. Three cannulated screws are seen traversing the subcapital left femoral neck fracture seen previously, with near anatomic alignment. FLUOROSCOPY TIME:  32 seconds IMPRESSION: 1. Pinning of a left hip subcapital femoral neck fracture with near anatomic alignment. Electronically Signed   By: Randa Ngo M.D.   On: 07/19/2020 16:59   DG HIP OPERATIVE UNILAT W OR W/O PELVIS LEFT  Result Date: 07/19/2020 CLINICAL DATA:  Left hip pinning, left hip fracture EXAM: OPERATIVE LEFT HIP (WITH PELVIS IF PERFORMED) 2 VIEWS TECHNIQUE: Fluoroscopic spot image(s) were  submitted for interpretation post-operatively. COMPARISON:  07/18/2020 FINDINGS: Two fluoroscopic images are obtained during performance of the procedure and are provided for interpretation only. Three cannulated screws are seen traversing the subcapital left femoral neck fracture seen previously, with near anatomic alignment. FLUOROSCOPY TIME:  32 seconds IMPRESSION: 1. Pinning of a left hip subcapital femoral neck fracture with near anatomic alignment. Electronically Signed   By: Randa Ngo M.D.   On: 07/19/2020 16:59   DG FEMUR MIN 2 VIEWS LEFT  Result Date: 07/18/2020 CLINICAL DATA:  Fall femur pain EXAM: LEFT FEMUR 2 VIEWS COMPARISON:  None. FINDINGS: There is a nondisplaced transcervical femoral neck fracture which is slightly impacted. The femoral head is still well seated within the acetabulum. Overlying soft tissue swelling is seen. IMPRESSION: Nondisplaced transcervical femoral neck fracture. Electronically Signed   By: Prudencio Pair M.D.   On: 07/18/2020 16:33    Microbiology: Recent Results (from the past 240 hour(s))  Respiratory Panel by RT PCR (Flu A&B, Covid) - Nasopharyngeal Swab     Status: None   Collection Time: 07/18/20  5:38 PM   Specimen: Nasopharyngeal Swab  Result Value Ref Range Status    SARS Coronavirus 2 by RT PCR NEGATIVE NEGATIVE Final    Comment: (NOTE) SARS-CoV-2 target nucleic acids are NOT DETECTED.  The SARS-CoV-2 RNA is generally detectable in upper respiratoy specimens during the acute phase of infection. The lowest concentration of SARS-CoV-2 viral copies this assay can detect is 131 copies/mL. A negative result does not preclude SARS-Cov-2 infection and should not be used as the sole basis for treatment or other patient management decisions. A negative result may occur with  improper specimen collection/handling, submission of specimen other than nasopharyngeal swab, presence of viral mutation(s) within the areas targeted by this assay, and inadequate number of viral copies (<131 copies/mL). A negative result must be combined with clinical observations, patient history, and epidemiological information. The expected result is Negative.  Fact Sheet for Patients:  PinkCheek.be  Fact Sheet for Healthcare Providers:  GravelBags.it  This test is no t yet approved or cleared by the [place] FDA and  has been authorized for detection and/or diagnosis of SARS-CoV-2 by FDA under an Emergency Use Authorization (EUA). This EUA will remain  in effect (meaning this test can be used) for the duration of the COVID-19 declaration under Section 564(b)(1) of the Act, 21 U.S.C. section 360bbb-3(b)(1), unless the authorization is terminated or revoked sooner.     Influenza A by PCR NEGATIVE NEGATIVE Final   Influenza B by PCR NEGATIVE NEGATIVE Final    Comment: (NOTE) The Xpert Xpress SARS-CoV-2/FLU/RSV assay is intended as an aid in  the diagnosis of influenza from Nasopharyngeal swab specimens and  should not be used as a sole basis for treatment. Nasal washings and  aspirates are unacceptable for Xpert Xpress SARS-CoV-2/FLU/RSV  testing.  Fact Sheet for  Patients: PinkCheek.be  Fact Sheet for Healthcare Providers: GravelBags.it  This test is not yet approved or cleared by the [place] FDA and  has been authorized for detection and/or diagnosis of SARS-CoV-2 by  FDA under an Emergency Use Authorization (EUA). This EUA will remain  in effect (meaning this test can be used) for the duration of the  Covid-19 declaration under Section 564(b)(1) of the Act, 21  U.S.C. section 360bbb-3(b)(1), unless the authorization is  terminated or revoked. Performed at [facility], [place] [address] [place], [place] [address]   Urine culture     Status:  Abnormal   Collection Time: 07/18/20  6:29 PM   Specimen: Urine, Random  Result Value Ref Range Status   Specimen Description URINE, RANDOM  Final   Special Requests NONE  Final   Culture (A)  Final    <10,000 COLONIES/mL INSIGNIFICANT GROWTH Performed at [facility], [place] [address], [place], [place] [address]    Report Status 07/19/2020 FINAL  Final  Surgical pcr screen     Status: Abnormal   Collection Time: 07/19/20  1:07 AM   Specimen: Nasal Mucosa; Nasal Swab  Result Value Ref Range Status   MRSA, PCR NEGATIVE NEGATIVE Final   Staphylococcus aureus POSITIVE (A) NEGATIVE Final    Comment: (NOTE) The Xpert SA Assay (FDA approved for NASAL specimens in patients 55 years of age and older), is one component of a comprehensive surveillance program. It is not intended to diagnose infection nor to guide or monitor treatment. Performed at [facility], [place] [address], [place], [name] [address]   SARS Coronavirus 2 by RT PCR (hospital order, performed in [facility] hospital lab) Nasopharyngeal Nasopharyngeal Swab     Status: None   Collection Time: 07/22/20  3:26 AM   Specimen: Nasopharyngeal Swab  Result Value Ref Range Status   SARS Coronavirus 2 NEGATIVE NEGATIVE Final    Comment: (NOTE) SARS-CoV-2 target  nucleic acids are NOT DETECTED.  The SARS-CoV-2 RNA is generally detectable in upper and lower respiratory specimens during the acute phase of infection. The lowest concentration of SARS-CoV-2 viral copies this assay can detect is 250 copies / mL. A negative result does not preclude SARS-CoV-2 infection and should not be used as the sole basis for treatment or other patient management decisions.  A negative result may occur with improper specimen collection / handling, submission of specimen other than nasopharyngeal swab, presence of viral mutation(s) within the areas targeted by this assay, and inadequate number of viral copies (<250 copies / mL). A negative result must be combined with clinical observations, patient history, and epidemiological information.  Fact Sheet for Patients:   StrictlyIdeas.no  Fact Sheet for Healthcare Providers: BankingDealers.co.za  This test is not yet approved or  cleared by the [place] FDA and has been authorized for detection and/or diagnosis of SARS-CoV-2 by FDA under an Emergency Use Authorization (EUA).  This EUA will remain in effect (meaning this test can be used) for the duration of the COVID-19 declaration under Section 564(b)(1) of the Act, 21 U.S.C. section 360bbb-3(b)(1), unless the authorization is terminated or revoked sooner.  Performed at [facility], [place] [address], [place], [place] [address]      Labs: Basic Metabolic Panel: Recent Labs  Lab 07/18/20 1650 07/19/20 0136 07/20/20 0247 07/21/20 0034 07/22/20 0345  NA 138 136 135 139 140  K 4.2 3.7 4.2 4.1 4.1  CL 101 99 101 104 101  CO2 29 28 24 28 30   GLUCOSE 106* 148* 173* 107* 89  BUN 18 14 13 16 9   CREATININE 1.22* 1.00 0.92 1.08* 0.84  CALCIUM 9.0 8.8* 8.2* 8.4* 8.9   Liver Function Tests: Recent Labs  Lab 07/19/20 0136 07/20/20 0247 07/21/20 0034 07/22/20 0345  AST 26 23 18 23   ALT 29 23 11 14    ALKPHOS 46 44 40 46  BILITOT 0.4 0.3 0.4 0.6  PROT 5.5* 4.9* 4.8* 5.2*  ALBUMIN 3.0* 2.5* 2.4* 2.7*   No results for input(s): LIPASE, AMYLASE in the last 168 hours. No results for input(s):  AMMONIA in the last 168 hours. CBC: Recent Labs  Lab 07/18/20 1650 07/19/20 0136 07/20/20 0247 07/21/20 0034 07/22/20 0345  WBC 6.8 6.6 5.8 5.4 4.3  NEUTROABS 5.4  --  5.0 4.0 3.0  HGB 12.8 12.6 12.7 11.2* 12.6  HCT 39.9 39.1 40.3 35.7* 39.8  MCV 96.6 96.3 99.3 97.3 98.5  PLT 227 208 199 208 224   Cardiac Enzymes: No results for input(s): CKTOTAL, CKMB, CKMBINDEX, TROPONINI in the last 168 hours. BNP: BNP (last 3 results) No results for input(s): BNP in the last 8760 hours.  ProBNP (last 3 results) No results for input(s): PROBNP in the last 8760 hours.  CBG: No results for input(s): GLUCAP in the last 168 hours.     Signed:  Allie Bossier MD   [facility] 07/22/2020, 1:33 PM

## 2020-07-21 NOTE — Progress Notes (Signed)
Subjective: 2 Days Post-Op s/p Procedure(s): CANNULATED HIP PINNING  States pain has been well controlled. Has had gas but no bowel movement yet. Denies abdominal pain, nausea, or vomiting.   Objective:  PE: VITALS:   Vitals:   07/20/20 1141 07/20/20 2100 07/21/20 0300 07/21/20 0735  BP: (!) 149/81 134/75 (!) 158/79 (!) 189/94  Pulse: 78 67 63 75  Resp: 16 17 17 17   Temp: 98.4 F (36.9 C) 97.7 F (36.5 C) 97.8 F (36.6 C) 98.4 F (36.9 C)  TempSrc: Oral Oral Oral Oral  SpO2: 93% 93% 94% 95%  Weight:      Height:       General: elderly female, sitting up in bed, in no acute distress ABD soft Neurovascular intact Sensation intact distally Intact pulses distally Dorsiflexion/Plantar flexion intact Incision: dressing C/D/I Compartment soft  LABS  Results for orders placed or performed during the hospital encounter of 07/18/20 (from the past 24 hour(s))  CBC with Differential/Platelet     Status: Abnormal   Collection Time: 07/21/20 12:34 AM  Result Value Ref Range   WBC 5.4 4.0 - 10.5 K/uL   RBC 3.67 (L) 3.87 - 5.11 MIL/uL   Hemoglobin 11.2 (L) 12.0 - 15.0 g/dL   HCT 35.7 (L) 36 - 46 %   MCV 97.3 80.0 - 100.0 fL   MCH 30.5 26.0 - 34.0 pg   MCHC 31.4 30.0 - 36.0 g/dL   RDW 14.1 11.5 - 15.5 %   Platelets 208 150 - 400 K/uL   nRBC 0.0 0.0 - 0.2 %   Neutrophils Relative % 74 %   Neutro Abs 4.0 1.7 - 7.7 K/uL   Lymphocytes Relative 12 %   Lymphs Abs 0.7 0.7 - 4.0 K/uL   Monocytes Relative 11 %   Monocytes Absolute 0.6 0.1 - 1.0 K/uL   Eosinophils Relative 2 %   Eosinophils Absolute 0.1 0.0 - 0.5 K/uL   Basophils Relative 1 %   Basophils Absolute 0.0 0.0 - 0.1 K/uL   Immature Granulocytes 0 %   Abs Immature Granulocytes 0.01 0.00 - 0.07 K/uL  Comprehensive metabolic panel     Status: Abnormal   Collection Time: 07/21/20 12:34 AM  Result Value Ref Range   Sodium 139 135 - 145 mmol/L   Potassium 4.1 3.5 - 5.1 mmol/L   Chloride 104 98 - 111 mmol/L   CO2  28 22 - 32 mmol/L   Glucose, Bld 107 (H) 70 - 99 mg/dL   BUN 16 8 - 23 mg/dL   Creatinine, Ser 1.08 (H) 0.44 - 1.00 mg/dL   Calcium 8.4 (L) 8.9 - 10.3 mg/dL   Total Protein 4.8 (L) 6.5 - 8.1 g/dL   Albumin 2.4 (L) 3.5 - 5.0 g/dL   AST 18 15 - 41 U/L   ALT 11 0 - 44 U/L   Alkaline Phosphatase 40 38 - 126 U/L   Total Bilirubin 0.4 0.3 - 1.2 mg/dL   GFR, Estimated 51 (L) >60 mL/min   Anion gap 7 5 - 15    DG C-Arm 1-60 Min  Result Date: 07/19/2020 CLINICAL DATA:  Left hip pinning, left hip fracture EXAM: OPERATIVE LEFT HIP (WITH PELVIS IF PERFORMED) 2 VIEWS TECHNIQUE: Fluoroscopic spot image(s) were submitted for interpretation post-operatively. COMPARISON:  07/18/2020 FINDINGS: Two fluoroscopic images are obtained during performance of the procedure and are provided for interpretation only. Three cannulated screws are seen traversing the subcapital left femoral neck fracture seen previously, with near anatomic  alignment. FLUOROSCOPY TIME:  32 seconds IMPRESSION: 1. Pinning of a left hip subcapital femoral neck fracture with near anatomic alignment. Electronically Signed   By: Randa Ngo M.D.   On: 07/19/2020 16:59   DG HIP OPERATIVE UNILAT W OR W/O PELVIS LEFT  Result Date: 07/19/2020 CLINICAL DATA:  Left hip pinning, left hip fracture EXAM: OPERATIVE LEFT HIP (WITH PELVIS IF PERFORMED) 2 VIEWS TECHNIQUE: Fluoroscopic spot image(s) were submitted for interpretation post-operatively. COMPARISON:  07/18/2020 FINDINGS: Two fluoroscopic images are obtained during performance of the procedure and are provided for interpretation only. Three cannulated screws are seen traversing the subcapital left femoral neck fracture seen previously, with near anatomic alignment. FLUOROSCOPY TIME:  32 seconds IMPRESSION: 1. Pinning of a left hip subcapital femoral neck fracture with near anatomic alignment. Electronically Signed   By: Randa Ngo M.D.   On: 07/19/2020 16:59    Assessment/Plan: Principal  Problem:   Nondisplaced fracture of neck of femur (HCC) Active Problems:   Hypothyroidism   Essential hypertension, benign   CORONARY ATHEROSCLEROSIS NATIVE CORONARY ARTERY   Chronic constipation   GERD (gastroesophageal reflux disease)   AKI (acute kidney injury) ([place])   2 Days Post-Op s/p Procedure(s): CANNULATED HIP PINNING  Weightbearing: WBAT LLE, up with therapy Insicional and dressing care: reinforce as needed VTE prophylaxis: lovenox x 30 days Pain control: continue current regimen, limit narcotics as much as possible Follow - up plan: Follow up with Dr. Mardelle Matte in 2 weeks Dispo: PT recommending SNF   Contact information:   Weekdays 8-5 [address], [place] [phone] A fter hours and holidays please check [email or web address] for group call information for [facility]  Ventura Bruns 07/21/2020, 1:42 PM

## 2020-07-21 NOTE — Plan of Care (Signed)

## 2020-07-22 ENCOUNTER — Encounter (HOSPITAL_COMMUNITY): Payer: Self-pay | Admitting: Orthopedic Surgery

## 2020-07-22 DIAGNOSIS — Z743 Need for continuous supervision: Secondary | ICD-10-CM | POA: Diagnosis not present

## 2020-07-22 DIAGNOSIS — R2689 Other abnormalities of gait and mobility: Secondary | ICD-10-CM | POA: Diagnosis not present

## 2020-07-22 DIAGNOSIS — S79929A Unspecified injury of unspecified thigh, initial encounter: Secondary | ICD-10-CM | POA: Diagnosis not present

## 2020-07-22 DIAGNOSIS — R0902 Hypoxemia: Secondary | ICD-10-CM | POA: Diagnosis not present

## 2020-07-22 DIAGNOSIS — R6889 Other general symptoms and signs: Secondary | ICD-10-CM | POA: Diagnosis not present

## 2020-07-22 DIAGNOSIS — M2550 Pain in unspecified joint: Secondary | ICD-10-CM | POA: Diagnosis not present

## 2020-07-22 DIAGNOSIS — M8000XD Age-related osteoporosis with current pathological fracture, unspecified site, subsequent encounter for fracture with routine healing: Secondary | ICD-10-CM | POA: Diagnosis not present

## 2020-07-22 DIAGNOSIS — Z7401 Bed confinement status: Secondary | ICD-10-CM | POA: Diagnosis not present

## 2020-07-22 DIAGNOSIS — I10 Essential (primary) hypertension: Secondary | ICD-10-CM | POA: Diagnosis not present

## 2020-07-22 DIAGNOSIS — Z9181 History of falling: Secondary | ICD-10-CM | POA: Diagnosis not present

## 2020-07-22 DIAGNOSIS — D62 Acute posthemorrhagic anemia: Secondary | ICD-10-CM | POA: Diagnosis not present

## 2020-07-22 DIAGNOSIS — M6281 Muscle weakness (generalized): Secondary | ICD-10-CM | POA: Diagnosis not present

## 2020-07-22 DIAGNOSIS — S72002D Fracture of unspecified part of neck of left femur, subsequent encounter for closed fracture with routine healing: Secondary | ICD-10-CM | POA: Diagnosis not present

## 2020-07-22 DIAGNOSIS — I2510 Atherosclerotic heart disease of native coronary artery without angina pectoris: Secondary | ICD-10-CM | POA: Diagnosis not present

## 2020-07-22 LAB — CBC WITH DIFFERENTIAL/PLATELET
Abs Immature Granulocytes: 0.01 10*3/uL (ref 0.00–0.07)
Basophils Absolute: 0 10*3/uL (ref 0.0–0.1)
Basophils Relative: 1 %
Eosinophils Absolute: 0.2 10*3/uL (ref 0.0–0.5)
Eosinophils Relative: 4 %
HCT: 39.8 % (ref 36.0–46.0)
Hemoglobin: 12.6 g/dL (ref 12.0–15.0)
Immature Granulocytes: 0 %
Lymphocytes Relative: 15 %
Lymphs Abs: 0.7 10*3/uL (ref 0.7–4.0)
MCH: 31.2 pg (ref 26.0–34.0)
MCHC: 31.7 g/dL (ref 30.0–36.0)
MCV: 98.5 fL (ref 80.0–100.0)
Monocytes Absolute: 0.5 10*3/uL (ref 0.1–1.0)
Monocytes Relative: 11 %
Neutro Abs: 3 10*3/uL (ref 1.7–7.7)
Neutrophils Relative %: 69 %
Platelets: 224 10*3/uL (ref 150–400)
RBC: 4.04 MIL/uL (ref 3.87–5.11)
RDW: 14.2 % (ref 11.5–15.5)
WBC: 4.3 10*3/uL (ref 4.0–10.5)
nRBC: 0 % (ref 0.0–0.2)

## 2020-07-22 LAB — COMPREHENSIVE METABOLIC PANEL
ALT: 14 U/L (ref 0–44)
AST: 23 U/L (ref 15–41)
Albumin: 2.7 g/dL — ABNORMAL LOW (ref 3.5–5.0)
Alkaline Phosphatase: 46 U/L (ref 38–126)
Anion gap: 9 (ref 5–15)
BUN: 9 mg/dL (ref 8–23)
CO2: 30 mmol/L (ref 22–32)
Calcium: 8.9 mg/dL (ref 8.9–10.3)
Chloride: 101 mmol/L (ref 98–111)
Creatinine, Ser: 0.84 mg/dL (ref 0.44–1.00)
GFR, Estimated: >60 mL/min (ref >60)
Glucose, Bld: 89 mg/dL (ref 70–99)
Potassium: 4.1 mmol/L (ref 3.5–5.1)
Sodium: 140 mmol/L (ref 135–145)
Total Bilirubin: 0.6 mg/dL (ref 0.3–1.2)
Total Protein: 5.2 g/dL — ABNORMAL LOW (ref 6.5–8.1)

## 2020-07-22 LAB — SARS CORONAVIRUS 2 BY RT PCR (HOSPITAL ORDER, PERFORMED IN ~~LOC~~ HOSPITAL LAB): SARS Coronavirus 2: NEGATIVE

## 2020-07-22 MED ORDER — AMLODIPINE BESYLATE 5 MG PO TABS
5.0000 mg | ORAL_TABLET | Freq: Every day | ORAL | 0 refills | Status: DC
Start: 2020-07-22 — End: 2021-02-09

## 2020-07-22 MED ORDER — LOSARTAN POTASSIUM 25 MG PO TABS
25.0000 mg | ORAL_TABLET | Freq: Every day | ORAL | 0 refills | Status: DC
Start: 2020-07-22 — End: 2020-07-22

## 2020-07-22 MED ORDER — LOSARTAN POTASSIUM 50 MG PO TABS: 25.0000 mg | ORAL_TABLET | Freq: Every day | ORAL | Status: DC

## 2020-07-22 MED ORDER — AMLODIPINE BESYLATE 5 MG PO TABS
5.0000 mg | ORAL_TABLET | Freq: Every day | ORAL | Status: DC
  Administered 2020-07-22: 5 mg via ORAL
  Filled 2020-07-22: qty 1

## 2020-07-22 NOTE — Plan of Care (Signed)

## 2020-07-22 NOTE — TOC Transition Note (Signed)
Transition of Care [facility]) - CM/SW Discharge Note   Patient Details  Name: Andrea Jackson MRN: [id number] Date of Birth: 07-15-1934  [facility] CM/SW Contact:  Sharin Mons, RN Phone Number: [phone] 07/22/2020, 10:10 AM   Clinical Narrative:    Patient will DC to: [facility] Anticipated DC date: 07/22/2020 Family notified: [name] ( daughter) Transport by: [facility] [place], 11/7        Per MD patient ready for DC today . RN, patient, patient's family, and facility notified of DC. Discharge Summary and FL2 sent to facility. RN to call report prior to discharge [phone]). DC packet on chart. Ambulance transport requested for patient.   RNCM will sign off for now as intervention is no longer needed. Please consult [place] again if new needs arise.    Final next level of care: Skilled Nursing Facility Barriers to Discharge: No Barriers Identified   Patient Goals and CMS Choice Patient states their goals for this hospitalization and ongoing recovery are:: To get better and get home CMS Medicare.gov Compare Post Acute Care list provided to:: Patient Choice offered to / list presented to : Patient  Discharge Placement                       Discharge Plan and Services   Discharge Planning Services: CM Consult                      HH Arranged: PT, OT [facility] Agency: [facility] Date [place]: 07/21/20 Time [place]: 1102 Representative spoke with at [place]: [name]  Social Determinants of Health ([place]) Interventions     Readmission Risk Interventions No flowsheet data found.

## 2020-07-22 NOTE — Progress Notes (Signed)
Patients blood pressure 185/91  Xanax and Tramadol administered for sleep and pain. MD notified of Blood pressure and Labetalol ordered and administered   Repeat BP 143/71

## 2020-07-22 NOTE — Progress Notes (Signed)
Report called to facility, IVs removed, AVS and paperwork in chart, pt belongings gathered and PTAR called by CM. Will continue to monitor until d/c.

## 2020-07-22 NOTE — Progress Notes (Signed)
Subjective: 3 Days Post-Op s/p Procedure(s): CANNULATED HIP PINNING  Patient sitting up in bed. Worried about not yet having a bowel movement due to her history of blockages. Otherwise hip pain well controlled.   Objective:  PE: VITALS:   Vitals:   07/21/20 2219 07/21/20 2241 07/22/20 0323 07/22/20 0722  BP: (!) 141/73 (!) 143/71 (!) 160/90 (!) 166/84  Pulse: 79 68 70 67  Resp:   18 16  Temp:   97.7 F (36.5 C) (!) 97.2 F (36.2 C)  TempSrc:   Oral Oral  SpO2:   97% 97%  Weight:      Height:       General: elderly female, sitting up in bed, in no acute distress ABD soft, non-tender, non-distended Neurovascular intact Sensation intact distally Intact pulses distally Dorsiflexion/Plantar flexion intact Incision:dressing C/D/I Compartment soft  LABS  Results for orders placed or performed during the hospital encounter of 07/18/20 (from the past 24 hour(s))  SARS Coronavirus 2 by RT PCR (hospital order, performed in [place] hospital lab) Nasopharyngeal Nasopharyngeal Swab     Status: None   Collection Time: 07/22/20  3:26 AM   Specimen: Nasopharyngeal Swab  Result Value Ref Range   SARS Coronavirus 2 NEGATIVE NEGATIVE  CBC with Differential/Platelet     Status: None   Collection Time: 07/22/20  3:45 AM  Result Value Ref Range   WBC 4.3 4.0 - 10.5 K/uL   RBC 4.04 3.87 - 5.11 MIL/uL   Hemoglobin 12.6 12.0 - 15.0 g/dL   HCT 39.8 36 - 46 %   MCV 98.5 80.0 - 100.0 fL   MCH 31.2 26.0 - 34.0 pg   MCHC 31.7 30.0 - 36.0 g/dL   RDW 14.2 11.5 - 15.5 %   Platelets 224 150 - 400 K/uL   nRBC 0.0 0.0 - 0.2 %   Neutrophils Relative % 69 %   Neutro Abs 3.0 1.7 - 7.7 K/uL   Lymphocytes Relative 15 %   Lymphs Abs 0.7 0.7 - 4.0 K/uL   Monocytes Relative 11 %   Monocytes Absolute 0.5 0.1 - 1.0 K/uL   Eosinophils Relative 4 %   Eosinophils Absolute 0.2 0.0 - 0.5 K/uL   Basophils Relative 1 %   Basophils Absolute 0.0 0.0 - 0.1 K/uL   Immature Granulocytes 0 %   Abs  Immature Granulocytes 0.01 0.00 - 0.07 K/uL  Comprehensive metabolic panel     Status: Abnormal   Collection Time: 07/22/20  3:45 AM  Result Value Ref Range   Sodium 140 135 - 145 mmol/L   Potassium 4.1 3.5 - 5.1 mmol/L   Chloride 101 98 - 111 mmol/L   CO2 30 22 - 32 mmol/L   Glucose, Bld 89 70 - 99 mg/dL   BUN 9 8 - 23 mg/dL   Creatinine, Ser 0.84 0.44 - 1.00 mg/dL   Calcium 8.9 8.9 - 10.3 mg/dL   Total Protein 5.2 (L) 6.5 - 8.1 g/dL   Albumin 2.7 (L) 3.5 - 5.0 g/dL   AST 23 15 - 41 U/L   ALT 14 0 - 44 U/L   Alkaline Phosphatase 46 38 - 126 U/L   Total Bilirubin 0.6 0.3 - 1.2 mg/dL   GFR, Estimated >60 >60 mL/min   Anion gap 9 5 - 15    No results found.  Assessment/Plan: Principal Problem:   Nondisplaced fracture of neck of femur (HCC) Active Problems:   Hypothyroidism   Essential hypertension, benign  CORONARY ATHEROSCLEROSIS NATIVE CORONARY ARTERY   Chronic constipation   GERD (gastroesophageal reflux disease)   AKI (acute kidney injury) ([place])  Non-displaced fracture of femoral neck 3 Days Post-Op s/p Procedure(s): CANNULATED HIP PINNING Weightbearing:WBAT LLE, up with therapy Insicional and dressing care:reinforce as needed, CDI today VTE prophylaxis:lovenox x 30 days Pain control:continue current regimen, limit narcotics as much as possible Follow - up plan:Follow up with Dr. Mardelle Matte in 2 weeks Dispo: Possibly d/c to SNF today if bed available  Constipation: - hoping patient can have bowel movement prior to discharge, spoke with nursing about getting her more miralax this morning. Patient has been having gas for last 2 days.   Contact information:   Weekdays 8-5 [address], [place] [phone] A fter hours and holidays please check [email or web address] for group call information for [facility]  Ventura Bruns 07/22/2020, 8:00 AM

## 2020-07-23 DIAGNOSIS — S72002D Fracture of unspecified part of neck of left femur, subsequent encounter for closed fracture with routine healing: Secondary | ICD-10-CM | POA: Diagnosis not present

## 2020-07-23 DIAGNOSIS — M8000XD Age-related osteoporosis with current pathological fracture, unspecified site, subsequent encounter for fracture with routine healing: Secondary | ICD-10-CM | POA: Diagnosis not present

## 2020-07-23 LAB — TYPE AND SCREEN
ABO/RH(D): A POS
Antibody Screen: POSITIVE
Unit division: 0
Unit division: 0

## 2020-07-23 LAB — BPAM RBC
Blood Product Expiration Date: 202111242359
Blood Product Expiration Date: 202111242359
Unit Type and Rh: 6200
Unit Type and Rh: 6200

## 2020-08-04 DIAGNOSIS — M6281 Muscle weakness (generalized): Secondary | ICD-10-CM | POA: Diagnosis not present

## 2020-08-04 DIAGNOSIS — S72002A Fracture of unspecified part of neck of left femur, initial encounter for closed fracture: Secondary | ICD-10-CM | POA: Diagnosis not present

## 2020-08-04 DIAGNOSIS — R262 Difficulty in walking, not elsewhere classified: Secondary | ICD-10-CM | POA: Diagnosis not present

## 2020-08-04 DIAGNOSIS — Z4789 Encounter for other orthopedic aftercare: Secondary | ICD-10-CM | POA: Diagnosis not present

## 2020-08-05 DIAGNOSIS — S72002A Fracture of unspecified part of neck of left femur, initial encounter for closed fracture: Secondary | ICD-10-CM | POA: Diagnosis not present

## 2020-08-05 DIAGNOSIS — M6281 Muscle weakness (generalized): Secondary | ICD-10-CM | POA: Diagnosis not present

## 2020-08-05 DIAGNOSIS — R262 Difficulty in walking, not elsewhere classified: Secondary | ICD-10-CM | POA: Diagnosis not present

## 2020-08-05 DIAGNOSIS — Z4789 Encounter for other orthopedic aftercare: Secondary | ICD-10-CM | POA: Diagnosis not present

## 2020-08-07 DIAGNOSIS — M6281 Muscle weakness (generalized): Secondary | ICD-10-CM | POA: Diagnosis not present

## 2020-08-07 DIAGNOSIS — R262 Difficulty in walking, not elsewhere classified: Secondary | ICD-10-CM | POA: Diagnosis not present

## 2020-08-07 DIAGNOSIS — S72002A Fracture of unspecified part of neck of left femur, initial encounter for closed fracture: Secondary | ICD-10-CM | POA: Diagnosis not present

## 2020-08-07 DIAGNOSIS — Z4789 Encounter for other orthopedic aftercare: Secondary | ICD-10-CM | POA: Diagnosis not present

## 2020-08-10 DIAGNOSIS — Z4789 Encounter for other orthopedic aftercare: Secondary | ICD-10-CM | POA: Diagnosis not present

## 2020-08-10 DIAGNOSIS — R262 Difficulty in walking, not elsewhere classified: Secondary | ICD-10-CM | POA: Diagnosis not present

## 2020-08-10 DIAGNOSIS — M6281 Muscle weakness (generalized): Secondary | ICD-10-CM | POA: Diagnosis not present

## 2020-08-10 DIAGNOSIS — S72002A Fracture of unspecified part of neck of left femur, initial encounter for closed fracture: Secondary | ICD-10-CM | POA: Diagnosis not present

## 2020-08-11 DIAGNOSIS — N183 Chronic kidney disease, stage 3 unspecified: Secondary | ICD-10-CM | POA: Diagnosis not present

## 2020-08-11 DIAGNOSIS — I129 Hypertensive chronic kidney disease with stage 1 through stage 4 chronic kidney disease, or unspecified chronic kidney disease: Secondary | ICD-10-CM | POA: Diagnosis not present

## 2020-08-11 DIAGNOSIS — E7849 Other hyperlipidemia: Secondary | ICD-10-CM | POA: Diagnosis not present

## 2020-08-12 DIAGNOSIS — S72002A Fracture of unspecified part of neck of left femur, initial encounter for closed fracture: Secondary | ICD-10-CM | POA: Diagnosis not present

## 2020-08-12 DIAGNOSIS — M6281 Muscle weakness (generalized): Secondary | ICD-10-CM | POA: Diagnosis not present

## 2020-08-12 DIAGNOSIS — Z4789 Encounter for other orthopedic aftercare: Secondary | ICD-10-CM | POA: Diagnosis not present

## 2020-08-12 DIAGNOSIS — R262 Difficulty in walking, not elsewhere classified: Secondary | ICD-10-CM | POA: Diagnosis not present

## 2020-08-13 DIAGNOSIS — R262 Difficulty in walking, not elsewhere classified: Secondary | ICD-10-CM | POA: Diagnosis not present

## 2020-08-13 DIAGNOSIS — Z4789 Encounter for other orthopedic aftercare: Secondary | ICD-10-CM | POA: Diagnosis not present

## 2020-08-13 DIAGNOSIS — M6281 Muscle weakness (generalized): Secondary | ICD-10-CM | POA: Diagnosis not present

## 2020-08-13 DIAGNOSIS — S72002A Fracture of unspecified part of neck of left femur, initial encounter for closed fracture: Secondary | ICD-10-CM | POA: Diagnosis not present

## 2020-08-18 DIAGNOSIS — S72002A Fracture of unspecified part of neck of left femur, initial encounter for closed fracture: Secondary | ICD-10-CM | POA: Diagnosis not present

## 2020-08-18 DIAGNOSIS — M6281 Muscle weakness (generalized): Secondary | ICD-10-CM | POA: Diagnosis not present

## 2020-08-18 DIAGNOSIS — R262 Difficulty in walking, not elsewhere classified: Secondary | ICD-10-CM | POA: Diagnosis not present

## 2020-08-18 DIAGNOSIS — Z4789 Encounter for other orthopedic aftercare: Secondary | ICD-10-CM | POA: Diagnosis not present

## 2020-08-20 DIAGNOSIS — M6281 Muscle weakness (generalized): Secondary | ICD-10-CM | POA: Diagnosis not present

## 2020-08-20 DIAGNOSIS — R262 Difficulty in walking, not elsewhere classified: Secondary | ICD-10-CM | POA: Diagnosis not present

## 2020-08-20 DIAGNOSIS — S72002A Fracture of unspecified part of neck of left femur, initial encounter for closed fracture: Secondary | ICD-10-CM | POA: Diagnosis not present

## 2020-08-20 DIAGNOSIS — Z4789 Encounter for other orthopedic aftercare: Secondary | ICD-10-CM | POA: Diagnosis not present

## 2020-08-21 ENCOUNTER — Other Ambulatory Visit: Payer: Self-pay | Admitting: Orthopedic Surgery

## 2020-08-21 ENCOUNTER — Other Ambulatory Visit (HOSPITAL_COMMUNITY): Payer: Self-pay | Admitting: Orthopedic Surgery

## 2020-08-21 DIAGNOSIS — M25432 Effusion, left wrist: Secondary | ICD-10-CM

## 2020-08-21 DIAGNOSIS — M75111 Incomplete rotator cuff tear or rupture of right shoulder, not specified as traumatic: Secondary | ICD-10-CM | POA: Diagnosis not present

## 2020-08-24 DIAGNOSIS — M542 Cervicalgia: Secondary | ICD-10-CM | POA: Diagnosis not present

## 2020-08-24 DIAGNOSIS — M25511 Pain in right shoulder: Secondary | ICD-10-CM | POA: Diagnosis not present

## 2020-08-24 DIAGNOSIS — S72002D Fracture of unspecified part of neck of left femur, subsequent encounter for closed fracture with routine healing: Secondary | ICD-10-CM | POA: Diagnosis not present

## 2020-08-24 DIAGNOSIS — M25532 Pain in left wrist: Secondary | ICD-10-CM | POA: Diagnosis not present

## 2020-08-25 DIAGNOSIS — R262 Difficulty in walking, not elsewhere classified: Secondary | ICD-10-CM | POA: Diagnosis not present

## 2020-08-25 DIAGNOSIS — S72002A Fracture of unspecified part of neck of left femur, initial encounter for closed fracture: Secondary | ICD-10-CM | POA: Diagnosis not present

## 2020-08-25 DIAGNOSIS — Z4789 Encounter for other orthopedic aftercare: Secondary | ICD-10-CM | POA: Diagnosis not present

## 2020-08-25 DIAGNOSIS — M6281 Muscle weakness (generalized): Secondary | ICD-10-CM | POA: Diagnosis not present

## 2020-08-27 DIAGNOSIS — S72002A Fracture of unspecified part of neck of left femur, initial encounter for closed fracture: Secondary | ICD-10-CM | POA: Diagnosis not present

## 2020-08-27 DIAGNOSIS — R262 Difficulty in walking, not elsewhere classified: Secondary | ICD-10-CM | POA: Diagnosis not present

## 2020-08-27 DIAGNOSIS — Z4789 Encounter for other orthopedic aftercare: Secondary | ICD-10-CM | POA: Diagnosis not present

## 2020-08-27 DIAGNOSIS — M6281 Muscle weakness (generalized): Secondary | ICD-10-CM | POA: Diagnosis not present

## 2020-08-28 DIAGNOSIS — Z4789 Encounter for other orthopedic aftercare: Secondary | ICD-10-CM | POA: Diagnosis not present

## 2020-08-28 DIAGNOSIS — R262 Difficulty in walking, not elsewhere classified: Secondary | ICD-10-CM | POA: Diagnosis not present

## 2020-08-28 DIAGNOSIS — S72002A Fracture of unspecified part of neck of left femur, initial encounter for closed fracture: Secondary | ICD-10-CM | POA: Diagnosis not present

## 2020-08-28 DIAGNOSIS — M6281 Muscle weakness (generalized): Secondary | ICD-10-CM | POA: Diagnosis not present

## 2020-08-31 DIAGNOSIS — I10 Essential (primary) hypertension: Secondary | ICD-10-CM | POA: Diagnosis not present

## 2020-08-31 DIAGNOSIS — S72002A Fracture of unspecified part of neck of left femur, initial encounter for closed fracture: Secondary | ICD-10-CM | POA: Diagnosis not present

## 2020-08-31 DIAGNOSIS — E039 Hypothyroidism, unspecified: Secondary | ICD-10-CM | POA: Diagnosis not present

## 2020-08-31 DIAGNOSIS — E782 Mixed hyperlipidemia: Secondary | ICD-10-CM | POA: Diagnosis not present

## 2020-09-03 ENCOUNTER — Other Ambulatory Visit (HOSPITAL_COMMUNITY): Payer: Medicare Other

## 2020-09-15 DIAGNOSIS — M25519 Pain in unspecified shoulder: Secondary | ICD-10-CM | POA: Diagnosis not present

## 2020-09-15 DIAGNOSIS — E039 Hypothyroidism, unspecified: Secondary | ICD-10-CM | POA: Diagnosis not present

## 2020-09-15 DIAGNOSIS — I10 Essential (primary) hypertension: Secondary | ICD-10-CM | POA: Diagnosis not present

## 2020-09-15 DIAGNOSIS — N183 Chronic kidney disease, stage 3 unspecified: Secondary | ICD-10-CM | POA: Diagnosis not present

## 2020-09-15 DIAGNOSIS — D500 Iron deficiency anemia secondary to blood loss (chronic): Secondary | ICD-10-CM | POA: Diagnosis not present

## 2020-09-15 DIAGNOSIS — R5383 Other fatigue: Secondary | ICD-10-CM | POA: Diagnosis not present

## 2020-09-15 DIAGNOSIS — K2100 Gastro-esophageal reflux disease with esophagitis, without bleeding: Secondary | ICD-10-CM | POA: Diagnosis not present

## 2020-09-15 DIAGNOSIS — Z0001 Encounter for general adult medical examination with abnormal findings: Secondary | ICD-10-CM | POA: Diagnosis not present

## 2020-09-15 DIAGNOSIS — E782 Mixed hyperlipidemia: Secondary | ICD-10-CM | POA: Diagnosis not present

## 2020-09-21 DIAGNOSIS — N1831 Chronic kidney disease, stage 3a: Secondary | ICD-10-CM | POA: Diagnosis not present

## 2020-09-21 DIAGNOSIS — I700 Atherosclerosis of aorta: Secondary | ICD-10-CM | POA: Diagnosis not present

## 2020-09-21 DIAGNOSIS — E7800 Pure hypercholesterolemia, unspecified: Secondary | ICD-10-CM | POA: Diagnosis not present

## 2020-09-21 DIAGNOSIS — R4582 Worries: Secondary | ICD-10-CM | POA: Diagnosis not present

## 2020-09-21 DIAGNOSIS — N183 Chronic kidney disease, stage 3 unspecified: Secondary | ICD-10-CM | POA: Diagnosis not present

## 2020-09-21 DIAGNOSIS — I2510 Atherosclerotic heart disease of native coronary artery without angina pectoris: Secondary | ICD-10-CM | POA: Diagnosis not present

## 2020-09-21 DIAGNOSIS — I10 Essential (primary) hypertension: Secondary | ICD-10-CM | POA: Diagnosis not present

## 2020-09-21 DIAGNOSIS — E039 Hypothyroidism, unspecified: Secondary | ICD-10-CM | POA: Diagnosis not present

## 2020-09-21 DIAGNOSIS — Z0001 Encounter for general adult medical examination with abnormal findings: Secondary | ICD-10-CM | POA: Diagnosis not present

## 2020-09-21 DIAGNOSIS — E7849 Other hyperlipidemia: Secondary | ICD-10-CM | POA: Diagnosis not present

## 2020-09-21 DIAGNOSIS — Z6821 Body mass index (BMI) 21.0-21.9, adult: Secondary | ICD-10-CM | POA: Diagnosis not present

## 2020-09-21 DIAGNOSIS — M810 Age-related osteoporosis without current pathological fracture: Secondary | ICD-10-CM | POA: Diagnosis not present

## 2020-10-21 ENCOUNTER — Inpatient Hospital Stay (HOSPITAL_COMMUNITY): Payer: Medicare Other | Attending: Hematology

## 2020-10-21 ENCOUNTER — Other Ambulatory Visit: Payer: Self-pay

## 2020-10-21 DIAGNOSIS — E559 Vitamin D deficiency, unspecified: Secondary | ICD-10-CM | POA: Diagnosis not present

## 2020-10-21 DIAGNOSIS — D509 Iron deficiency anemia, unspecified: Secondary | ICD-10-CM | POA: Insufficient documentation

## 2020-10-21 DIAGNOSIS — E538 Deficiency of other specified B group vitamins: Secondary | ICD-10-CM | POA: Diagnosis not present

## 2020-10-21 LAB — CBC WITH DIFFERENTIAL/PLATELET
Abs Immature Granulocytes: 0.05 10*3/uL (ref 0.00–0.07)
Basophils Absolute: 0 10*3/uL (ref 0.0–0.1)
Basophils Relative: 1 %
Eosinophils Absolute: 0.1 10*3/uL (ref 0.0–0.5)
Eosinophils Relative: 2 %
HCT: 42.8 % (ref 36.0–46.0)
Hemoglobin: 13.2 g/dL (ref 12.0–15.0)
Immature Granulocytes: 1 %
Lymphocytes Relative: 12 %
Lymphs Abs: 1 10*3/uL (ref 0.7–4.0)
MCH: 29.7 pg (ref 26.0–34.0)
MCHC: 30.8 g/dL (ref 30.0–36.0)
MCV: 96.4 fL (ref 80.0–100.0)
Monocytes Absolute: 0.8 10*3/uL (ref 0.1–1.0)
Monocytes Relative: 9 %
Neutro Abs: 6.1 10*3/uL (ref 1.7–7.7)
Neutrophils Relative %: 75 %
Platelets: 340 10*3/uL (ref 150–400)
RBC: 4.44 MIL/uL (ref 3.87–5.11)
RDW: 13.8 % (ref 11.5–15.5)
WBC: 8.1 10*3/uL (ref 4.0–10.5)
nRBC: 0 % (ref 0.0–0.2)

## 2020-10-21 LAB — COMPREHENSIVE METABOLIC PANEL
ALT: 22 U/L (ref 0–44)
AST: 22 U/L (ref 15–41)
Albumin: 3.4 g/dL — ABNORMAL LOW (ref 3.5–5.0)
Alkaline Phosphatase: 61 U/L (ref 38–126)
Anion gap: 8 (ref 5–15)
BUN: 16 mg/dL (ref 8–23)
CO2: 29 mmol/L (ref 22–32)
Calcium: 9.1 mg/dL (ref 8.9–10.3)
Chloride: 95 mmol/L — ABNORMAL LOW (ref 98–111)
Creatinine, Ser: 0.92 mg/dL (ref 0.44–1.00)
GFR, Estimated: >60 mL/min (ref >60)
Glucose, Bld: 102 mg/dL — ABNORMAL HIGH (ref 70–99)
Potassium: 4.4 mmol/L (ref 3.5–5.1)
Sodium: 132 mmol/L — ABNORMAL LOW (ref 135–145)
Total Bilirubin: 0.8 mg/dL (ref 0.3–1.2)
Total Protein: 6.5 g/dL (ref 6.5–8.1)

## 2020-10-21 LAB — IRON AND TIBC
Iron: 51 ug/dL (ref 28–170)
Saturation Ratios: 16 % (ref 10.4–31.8)
TIBC: 319 ug/dL (ref 250–450)
UIBC: 268 ug/dL

## 2020-10-21 LAB — LACTATE DEHYDROGENASE: LDH: 178 U/L (ref 98–192)

## 2020-10-21 LAB — VITAMIN B12: Vitamin B-12: 3755 pg/mL — ABNORMAL HIGH (ref 180–914)

## 2020-10-21 LAB — FERRITIN: Ferritin: 194 ng/mL (ref 11–307)

## 2020-10-21 LAB — VITAMIN D 25 HYDROXY (VIT D DEFICIENCY, FRACTURES): Vit D, 25-Hydroxy: 99.48 ng/mL (ref 30–100)

## 2020-10-21 LAB — FOLATE: Folate: 32.7 ng/mL (ref >5.9)

## 2020-10-22 DIAGNOSIS — Z79891 Long term (current) use of opiate analgesic: Secondary | ICD-10-CM | POA: Diagnosis not present

## 2020-10-22 DIAGNOSIS — M170 Bilateral primary osteoarthritis of knee: Secondary | ICD-10-CM | POA: Diagnosis not present

## 2020-10-22 DIAGNOSIS — M47816 Spondylosis without myelopathy or radiculopathy, lumbar region: Secondary | ICD-10-CM | POA: Diagnosis not present

## 2020-10-22 DIAGNOSIS — M160 Bilateral primary osteoarthritis of hip: Secondary | ICD-10-CM | POA: Diagnosis not present
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
## 2020-10-26 ENCOUNTER — Ambulatory Visit (INDEPENDENT_AMBULATORY_CARE_PROVIDER_SITE_OTHER): Payer: Medicare Other | Admitting: Cardiology

## 2020-10-26 ENCOUNTER — Encounter: Payer: Self-pay | Admitting: *Deleted

## 2020-10-26 ENCOUNTER — Encounter: Payer: Self-pay | Admitting: Cardiology

## 2020-10-26 VITALS — BP 128/80 | HR 56 | Ht 63.0 in | Wt 116.0 lb

## 2020-10-26 DIAGNOSIS — I25119 Atherosclerotic heart disease of native coronary artery with unspecified angina pectoris: Secondary | ICD-10-CM

## 2020-10-26 DIAGNOSIS — I10 Essential (primary) hypertension: Secondary | ICD-10-CM | POA: Diagnosis not present

## 2020-10-26 NOTE — Patient Instructions (Signed)

## 2020-10-27 ENCOUNTER — Inpatient Hospital Stay (HOSPITAL_COMMUNITY): Payer: Medicare Other

## 2020-11-03 ENCOUNTER — Other Ambulatory Visit: Payer: Self-pay

## 2020-11-03 ENCOUNTER — Inpatient Hospital Stay (HOSPITAL_BASED_OUTPATIENT_CLINIC_OR_DEPARTMENT_OTHER): Payer: Medicare Other | Admitting: Hematology

## 2020-11-03 VITALS — BP 165/79 | HR 69 | Temp 97.1°F | Resp 18 | Wt 117.8 lb

## 2020-11-03 DIAGNOSIS — E559 Vitamin D deficiency, unspecified: Secondary | ICD-10-CM | POA: Diagnosis not present

## 2020-11-03 DIAGNOSIS — D509 Iron deficiency anemia, unspecified: Secondary | ICD-10-CM | POA: Diagnosis not present

## 2020-11-03 DIAGNOSIS — D508 Other iron deficiency anemias: Secondary | ICD-10-CM

## 2020-11-03 DIAGNOSIS — E538 Deficiency of other specified B group vitamins: Secondary | ICD-10-CM | POA: Diagnosis not present
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
## 2020-11-03 NOTE — Patient Instructions (Signed)
[facility] at [facility] Discharge Instructions  You were seen today by Dr. Delton Coombes. He went over your recent results. Start taking the vitamin B12 1 tablet every other day. Your next appointment will be with the physician assistant in 6 months for labs and follow up.   Thank you for choosing [place] at [facility] to provide your oncology and hematology care.  To afford each patient quality time with our provider, please arrive at least 15 minutes before your scheduled appointment time.   If you have a lab appointment with the [facility] please come in thru the Main Entrance and check in at the main information desk  You need to re-schedule your appointment should you arrive 10 or more minutes late.  We strive to give you quality time with our providers, and arriving late affects you and other patients whose appointments are after yours.  Also, if you no show three or more times for appointments you may be dismissed from the clinic at the providers discretion.     Again, thank you for choosing [facility].  Our hope is that these requests will decrease the amount of time that you wait before being seen by our physicians.       _____________________________________________________________  Should you have questions after your visit to [facility], please contact our office at [phone] between the hours of 8:00 a.m. and 4:30 p.m.  Voicemails left after 4:00 p.m. will not be returned until the following business day.  For prescription refill requests, have your pharmacy contact our office and allow 72 hours.    Cancer Center Support Programs:   > Cancer Support Group  2nd Tuesday of the month 1pm-2pm, Journey Room

## 2020-11-06 DIAGNOSIS — S72002A Fracture of unspecified part of neck of left femur, initial encounter for closed fracture: Secondary | ICD-10-CM | POA: Diagnosis not present

## 2020-11-06 DIAGNOSIS — M25532 Pain in left wrist: Secondary | ICD-10-CM | POA: Diagnosis not present

## 2020-11-09 DIAGNOSIS — M810 Age-related osteoporosis without current pathological fracture: Secondary | ICD-10-CM | POA: Diagnosis not present

## 2020-11-09 DIAGNOSIS — N183 Chronic kidney disease, stage 3 unspecified: Secondary | ICD-10-CM | POA: Diagnosis not present

## 2020-11-09 DIAGNOSIS — I129 Hypertensive chronic kidney disease with stage 1 through stage 4 chronic kidney disease, or unspecified chronic kidney disease: Secondary | ICD-10-CM | POA: Diagnosis not present

## 2020-11-09 DIAGNOSIS — E7849 Other hyperlipidemia: Secondary | ICD-10-CM | POA: Diagnosis not present

## 2020-11-16 DIAGNOSIS — M47816 Spondylosis without myelopathy or radiculopathy, lumbar region: Secondary | ICD-10-CM | POA: Diagnosis not present

## 2020-11-23 ENCOUNTER — Other Ambulatory Visit (HOSPITAL_COMMUNITY): Payer: Self-pay

## 2020-11-23 DIAGNOSIS — E559 Vitamin D deficiency, unspecified: Secondary | ICD-10-CM

## 2020-11-23 MED ORDER — ERGOCALCIFEROL 1.25 MG (50000 UT) PO CAPS: 50000.0000 [IU] | ORAL_CAPSULE | ORAL | 3 refills | Status: DC

## 2020-11-24 ENCOUNTER — Other Ambulatory Visit: Payer: Self-pay | Admitting: Cardiology

## 2020-12-07 DIAGNOSIS — M47816 Spondylosis without myelopathy or radiculopathy, lumbar region: Secondary | ICD-10-CM | POA: Diagnosis not present

## 2020-12-11 DIAGNOSIS — S72002D Fracture of unspecified part of neck of left femur, subsequent encounter for closed fracture with routine healing: Secondary | ICD-10-CM | POA: Diagnosis not present

## 2020-12-28 DIAGNOSIS — M47816 Spondylosis without myelopathy or radiculopathy, lumbar region: Secondary | ICD-10-CM | POA: Diagnosis not present

## 2021-01-09 DIAGNOSIS — M810 Age-related osteoporosis without current pathological fracture: Secondary | ICD-10-CM | POA: Diagnosis not present

## 2021-01-09 DIAGNOSIS — N183 Chronic kidney disease, stage 3 unspecified: Secondary | ICD-10-CM | POA: Diagnosis not present

## 2021-01-09 DIAGNOSIS — E7849 Other hyperlipidemia: Secondary | ICD-10-CM | POA: Diagnosis not present

## 2021-01-09 DIAGNOSIS — I129 Hypertensive chronic kidney disease with stage 1 through stage 4 chronic kidney disease, or unspecified chronic kidney disease: Secondary | ICD-10-CM | POA: Diagnosis not present

## 2021-01-11 DIAGNOSIS — E782 Mixed hyperlipidemia: Secondary | ICD-10-CM | POA: Diagnosis not present

## 2021-01-11 DIAGNOSIS — E7849 Other hyperlipidemia: Secondary | ICD-10-CM | POA: Diagnosis not present

## 2021-01-11 DIAGNOSIS — E039 Hypothyroidism, unspecified: Secondary | ICD-10-CM | POA: Diagnosis not present

## 2021-01-11 DIAGNOSIS — D500 Iron deficiency anemia secondary to blood loss (chronic): Secondary | ICD-10-CM | POA: Diagnosis not present

## 2021-01-11 DIAGNOSIS — D649 Anemia, unspecified: Secondary | ICD-10-CM | POA: Diagnosis not present

## 2021-01-14 DIAGNOSIS — E7849 Other hyperlipidemia: Secondary | ICD-10-CM | POA: Diagnosis not present

## 2021-01-14 DIAGNOSIS — I10 Essential (primary) hypertension: Secondary | ICD-10-CM | POA: Diagnosis not present

## 2021-01-14 DIAGNOSIS — R4582 Worries: Secondary | ICD-10-CM | POA: Diagnosis not present

## 2021-01-14 DIAGNOSIS — Z0001 Encounter for general adult medical examination with abnormal findings: Secondary | ICD-10-CM | POA: Diagnosis not present

## 2021-01-14 DIAGNOSIS — K2100 Gastro-esophageal reflux disease with esophagitis, without bleeding: Secondary | ICD-10-CM | POA: Diagnosis not present

## 2021-01-14 DIAGNOSIS — I700 Atherosclerosis of aorta: Secondary | ICD-10-CM | POA: Diagnosis not present

## 2021-01-14 DIAGNOSIS — N1831 Chronic kidney disease, stage 3a: Secondary | ICD-10-CM | POA: Diagnosis not present

## 2021-01-14 DIAGNOSIS — E039 Hypothyroidism, unspecified: Secondary | ICD-10-CM | POA: Diagnosis not present

## 2021-01-18 DIAGNOSIS — M810 Age-related osteoporosis without current pathological fracture: Secondary | ICD-10-CM | POA: Diagnosis not present

## 2021-01-20 DIAGNOSIS — M19019 Primary osteoarthritis, unspecified shoulder: Secondary | ICD-10-CM | POA: Diagnosis not present

## 2021-01-20 DIAGNOSIS — M47816 Spondylosis without myelopathy or radiculopathy, lumbar region: Secondary | ICD-10-CM | POA: Diagnosis not present

## 2021-01-29 ENCOUNTER — Inpatient Hospital Stay (HOSPITAL_COMMUNITY)
Admission: AD | Admit: 2021-01-29 | Discharge: 2021-02-01 | DRG: 101 | Disposition: A | Discharge status: Home / Self Care | Payer: Medicare Other | Source: Other Acute Inpatient Hospital | Attending: Internal Medicine | Admitting: Internal Medicine

## 2021-01-29 DIAGNOSIS — E1165 Type 2 diabetes mellitus with hyperglycemia: Secondary | ICD-10-CM | POA: Diagnosis not present

## 2021-01-29 DIAGNOSIS — R569 Unspecified convulsions: Principal | ICD-10-CM

## 2021-01-29 DIAGNOSIS — J323 Chronic sphenoidal sinusitis: Secondary | ICD-10-CM | POA: Diagnosis not present

## 2021-01-29 DIAGNOSIS — R404 Transient alteration of awareness: Secondary | ICD-10-CM | POA: Diagnosis not present

## 2021-01-29 DIAGNOSIS — F419 Anxiety disorder, unspecified: Secondary | ICD-10-CM | POA: Diagnosis present

## 2021-01-29 DIAGNOSIS — R6889 Other general symptoms and signs: Secondary | ICD-10-CM | POA: Diagnosis not present

## 2021-01-29 DIAGNOSIS — Z743 Need for continuous supervision: Secondary | ICD-10-CM | POA: Diagnosis not present

## 2021-01-29 DIAGNOSIS — E785 Hyperlipidemia, unspecified: Secondary | ICD-10-CM | POA: Diagnosis present

## 2021-01-29 DIAGNOSIS — R9431 Abnormal electrocardiogram [ECG] [EKG]: Secondary | ICD-10-CM | POA: Diagnosis not present

## 2021-01-29 DIAGNOSIS — Z66 Do not resuscitate: Secondary | ICD-10-CM | POA: Diagnosis present

## 2021-01-29 DIAGNOSIS — I674 Hypertensive encephalopathy: Secondary | ICD-10-CM | POA: Diagnosis not present

## 2021-01-29 DIAGNOSIS — Z79899 Other long term (current) drug therapy: Secondary | ICD-10-CM

## 2021-01-29 DIAGNOSIS — I252 Old myocardial infarction: Secondary | ICD-10-CM | POA: Diagnosis not present

## 2021-01-29 DIAGNOSIS — E039 Hypothyroidism, unspecified: Secondary | ICD-10-CM | POA: Diagnosis present

## 2021-01-29 DIAGNOSIS — Z20822 Contact with and (suspected) exposure to covid-19: Secondary | ICD-10-CM | POA: Diagnosis present

## 2021-01-29 DIAGNOSIS — R471 Dysarthria and anarthria: Secondary | ICD-10-CM | POA: Diagnosis not present

## 2021-01-29 DIAGNOSIS — K219 Gastro-esophageal reflux disease without esophagitis: Secondary | ICD-10-CM | POA: Diagnosis not present

## 2021-01-29 DIAGNOSIS — I6523 Occlusion and stenosis of bilateral carotid arteries: Secondary | ICD-10-CM | POA: Diagnosis not present

## 2021-01-29 DIAGNOSIS — I639 Cerebral infarction, unspecified: Secondary | ICD-10-CM | POA: Diagnosis not present

## 2021-01-29 DIAGNOSIS — Z885 Allergy status to narcotic agent status: Secondary | ICD-10-CM

## 2021-01-29 DIAGNOSIS — Z85828 Personal history of other malignant neoplasm of skin: Secondary | ICD-10-CM | POA: Diagnosis not present

## 2021-01-29 DIAGNOSIS — I10 Essential (primary) hypertension: Secondary | ICD-10-CM | POA: Diagnosis not present

## 2021-01-29 DIAGNOSIS — Z833 Family history of diabetes mellitus: Secondary | ICD-10-CM | POA: Diagnosis not present

## 2021-01-29 DIAGNOSIS — R531 Weakness: Secondary | ICD-10-CM | POA: Diagnosis not present

## 2021-01-29 DIAGNOSIS — R1310 Dysphagia, unspecified: Secondary | ICD-10-CM | POA: Diagnosis present

## 2021-01-29 DIAGNOSIS — I6503 Occlusion and stenosis of bilateral vertebral arteries: Secondary | ICD-10-CM | POA: Diagnosis not present

## 2021-01-29 DIAGNOSIS — R4701 Aphasia: Secondary | ICD-10-CM | POA: Diagnosis not present

## 2021-01-29 DIAGNOSIS — R4182 Altered mental status, unspecified: Secondary | ICD-10-CM | POA: Diagnosis present

## 2021-01-29 DIAGNOSIS — R55 Syncope and collapse: Secondary | ICD-10-CM | POA: Diagnosis not present

## 2021-01-29 DIAGNOSIS — Z8679 Personal history of other diseases of the circulatory system: Secondary | ICD-10-CM

## 2021-01-29 DIAGNOSIS — I444 Left anterior fascicular block: Secondary | ICD-10-CM | POA: Diagnosis not present

## 2021-01-29 DIAGNOSIS — I161 Hypertensive emergency: Secondary | ICD-10-CM | POA: Diagnosis not present

## 2021-01-29 DIAGNOSIS — E890 Postprocedural hypothyroidism: Secondary | ICD-10-CM | POA: Diagnosis present

## 2021-01-29 DIAGNOSIS — G319 Degenerative disease of nervous system, unspecified: Secondary | ICD-10-CM | POA: Diagnosis not present

## 2021-01-29 DIAGNOSIS — G9389 Other specified disorders of brain: Secondary | ICD-10-CM | POA: Diagnosis not present

## 2021-01-29 DIAGNOSIS — Z886 Allergy status to analgesic agent status: Secondary | ICD-10-CM

## 2021-01-29 DIAGNOSIS — G9349 Other encephalopathy: Secondary | ICD-10-CM | POA: Diagnosis present

## 2021-01-29 DIAGNOSIS — G459 Transient cerebral ischemic attack, unspecified: Secondary | ICD-10-CM | POA: Diagnosis not present

## 2021-01-29 DIAGNOSIS — I77810 Thoracic aortic ectasia: Secondary | ICD-10-CM | POA: Diagnosis not present

## 2021-01-29 DIAGNOSIS — I2510 Atherosclerotic heart disease of native coronary artery without angina pectoris: Secondary | ICD-10-CM | POA: Diagnosis present

## 2021-01-29 DIAGNOSIS — Z8719 Personal history of other diseases of the digestive system: Secondary | ICD-10-CM | POA: Diagnosis not present

## 2021-01-29 DIAGNOSIS — R251 Tremor, unspecified: Secondary | ICD-10-CM | POA: Diagnosis not present

## 2021-01-29 DIAGNOSIS — D509 Iron deficiency anemia, unspecified: Secondary | ICD-10-CM | POA: Diagnosis not present

## 2021-01-29 DIAGNOSIS — R410 Disorientation, unspecified: Secondary | ICD-10-CM | POA: Diagnosis not present

## 2021-01-29 DIAGNOSIS — Z888 Allergy status to other drugs, medicaments and biological substances status: Secondary | ICD-10-CM | POA: Diagnosis not present

## 2021-01-29 DIAGNOSIS — Z7902 Long term (current) use of antithrombotics/antiplatelets: Secondary | ICD-10-CM

## 2021-01-29 DIAGNOSIS — Z79890 Hormone replacement therapy (postmenopausal): Secondary | ICD-10-CM

## 2021-01-30 ENCOUNTER — Inpatient Hospital Stay (HOSPITAL_COMMUNITY): Payer: Medicare Other

## 2021-01-30 DIAGNOSIS — R569 Unspecified convulsions: Secondary | ICD-10-CM

## 2021-01-30 DIAGNOSIS — I639 Cerebral infarction, unspecified: Secondary | ICD-10-CM

## 2021-01-30 DIAGNOSIS — E039 Hypothyroidism, unspecified: Secondary | ICD-10-CM | POA: Diagnosis not present

## 2021-01-30 DIAGNOSIS — I10 Essential (primary) hypertension: Secondary | ICD-10-CM

## 2021-01-30 DIAGNOSIS — R4182 Altered mental status, unspecified: Secondary | ICD-10-CM | POA: Diagnosis present

## 2021-01-30 DIAGNOSIS — R410 Disorientation, unspecified: Secondary | ICD-10-CM | POA: Diagnosis not present

## 2021-01-30 LAB — LIPID PANEL
Cholesterol: 220 mg/dL — ABNORMAL HIGH (ref 0–200)
HDL: 62 mg/dL (ref >40)
LDL Cholesterol: 137 mg/dL — ABNORMAL HIGH (ref 0–99)
Total CHOL/HDL Ratio: 3.5 RATIO
Triglycerides: 103 mg/dL (ref <150)
VLDL: 21 mg/dL (ref 0–40)

## 2021-01-30 LAB — CREATININE, SERUM
Creatinine, Ser: 0.95 mg/dL (ref 0.44–1.00)
GFR, Estimated: 59 mL/min — ABNORMAL LOW (ref >60)

## 2021-01-30 LAB — HEMOGLOBIN A1C
Hgb A1c MFr Bld: 5.9 % — ABNORMAL HIGH (ref 4.8–5.6)
Mean Plasma Glucose: 122.63 mg/dL

## 2021-01-30 LAB — GLUCOSE, CAPILLARY: Glucose-Capillary: 108 mg/dL — ABNORMAL HIGH (ref 70–99)

## 2021-01-30 LAB — AMMONIA: Ammonia: 22 umol/L (ref 9–35)

## 2021-01-30 LAB — SARS CORONAVIRUS 2 (TAT 6-24 HRS): SARS Coronavirus 2: NEGATIVE

## 2021-01-30 MED ORDER — SODIUM CHLORIDE 0.9 % IV SOLN: 50.0000 mL | Freq: Once | INTRAVENOUS | Status: DC

## 2021-01-30 MED ORDER — IOHEXOL 350 MG/ML SOLN
50.0000 mL | Freq: Once | INTRAVENOUS | Status: AC | PRN
  Administered 2021-01-30: 50 mL via INTRAVENOUS

## 2021-01-30 MED ORDER — HYDRALAZINE HCL 20 MG/ML IJ SOLN
10.0000 mg | INTRAMUSCULAR | Status: DC | PRN
  Administered 2021-01-30: 10 mg via INTRAVENOUS
  Filled 2021-01-30 (×2): qty 1

## 2021-01-30 MED ORDER — ALTEPLASE (STROKE) FULL DOSE INFUSION
0.9000 mg/kg | Freq: Once | INTRAVENOUS | Status: DC
  Filled 2021-01-30: qty 100

## 2021-01-30 MED ORDER — DEXTROSE 5 % IV SOLN
10.0000 mg/kg | Freq: Once | INTRAVENOUS | Status: AC
  Administered 2021-01-30: 545 mg via INTRAVENOUS
  Filled 2021-01-30 (×3): qty 10.9

## 2021-01-30 MED ORDER — ALTEPLASE (STROKE) FULL DOSE INFUSION: 0.9000 mg/kg | Freq: Once | INTRAVENOUS | Status: DC

## 2021-01-30 MED ORDER — DEXTROSE 5 % IV SOLN
10.0000 mg/kg | Freq: Two times a day (BID) | INTRAVENOUS | Status: DC
  Administered 2021-01-31: 545 mg via INTRAVENOUS
  Filled 2021-01-30 (×2): qty 10.9

## 2021-01-30 MED ORDER — CHLORHEXIDINE GLUCONATE CLOTH 2 % EX PADS
6.0000 | MEDICATED_PAD | Freq: Every day | CUTANEOUS | Status: DC
  Administered 2021-01-30: 6 via TOPICAL

## 2021-01-30 MED ORDER — STROKE: EARLY STAGES OF RECOVERY BOOK: Freq: Once | Status: DC

## 2021-01-30 MED ORDER — LEVOTHYROXINE SODIUM 75 MCG PO TABS
150.0000 ug | ORAL_TABLET | Freq: Every day | ORAL | Status: DC
  Administered 2021-01-30 – 2021-02-01 (×3): 150 ug via ORAL
  Filled 2021-01-30 (×3): qty 2

## 2021-01-30 MED ORDER — SODIUM CHLORIDE 0.9 % IV SOLN: INTRAVENOUS | Status: DC

## 2021-01-30 MED ORDER — LABETALOL HCL 5 MG/ML IV SOLN
10.0000 mg | INTRAVENOUS | Status: DC | PRN
  Administered 2021-01-31: 10 mg via INTRAVENOUS
  Filled 2021-01-30: qty 4

## 2021-01-30 MED ORDER — CLEVIDIPINE BUTYRATE 0.5 MG/ML IV EMUL
0.0000 mg/h | INTRAVENOUS | Status: DC
  Filled 2021-01-30: qty 50

## 2021-01-30 MED ORDER — LEVETIRACETAM IN NACL 500 MG/100ML IV SOLN
500.0000 mg | Freq: Two times a day (BID) | INTRAVENOUS | Status: DC
  Administered 2021-01-31 – 2021-02-01 (×3): 500 mg via INTRAVENOUS
  Filled 2021-01-30 (×3): qty 100

## 2021-01-30 MED ORDER — LEVETIRACETAM IN NACL 1500 MG/100ML IV SOLN
1500.0000 mg | INTRAVENOUS | Status: AC
  Administered 2021-01-30: 1500 mg via INTRAVENOUS
  Filled 2021-01-30 (×2): qty 100

## 2021-01-30 NOTE — Progress Notes (Signed)
Routine EEG complete, overnight EEG next - results pending.

## 2021-01-30 NOTE — Progress Notes (Signed)
Dr. Malen Gauze at bedside at 1135, says patient's assessment is different. Dr. Malen Gauze advised RN to activate code stroke. LKW 1000 on 01/30/21. Code stroke line called at 1139. Code stroke activated at 1146. Dr. Malen Gauze ordered stat CT head, and RN took patient down to CT with RR RN. After imaging was completed Dr. Malen Gauze ordered TPA. No ICU beds currently available, patient was taken back to 3W.

## 2021-01-30 NOTE — Evaluation (Signed)
Occupational Therapy Evaluation Patient Details Name: Andrea Jackson MRN: [id number] DOB: Aug 24, 1934 Today's Date: 01/30/2021    History of Present Illness 87 y.o. female presenting to ED with AMS of unclear etiology. Work-up pending.  PMHx significant for HTN, thyroid disease, CAD, NSTEMI, Hx of skin cancer, L hip fx. 07/2020 s/p pinning,and HLD.   Clinical Impression   PTA patient was living with her grandson (79 y.o.) and was grossly I with ADLs/IADLs with intermittent use of SPC in community dwellings only. Patient drives and reports leaving her home once every two weeks. Patient currently functioning below baseline demonstrating observed ADLs including toileting/hygiene/clothing management, LB dressing and grooming standing at sink level with Min guard to Min A and HHA+1 at most. Patient also limited by deficits listed below including decreased cognition and decreased balance presenting increased risk of falls and would benefit from continued acute OT services in prep for safe d/c home with initial 24hr supervision/assist from family and [place] pending patient progress.      Follow Up Recommendations  Home health OT;Supervision/Assistance - 24 hour    Equipment Recommendations  3 in 1 bedside commode    Recommendations for Other Services       Precautions / Restrictions Precautions Precautions: Fall Restrictions Weight Bearing Restrictions: No      Mobility Bed Mobility Overal bed mobility: Needs Assistance Bed Mobility: Sit to Supine       Sit to supine: Supervision;HOB elevated   General bed mobility comments: Supervision A for safety. No external assist requried.    Transfers Overall transfer level: Needs assistance Equipment used: 1 person hand held assist Transfers: Sit to/from Stand Sit to Stand: Min guard         General transfer comment: Min guard from EOB positioned in lowest setting. Patient requires 1 person HHA for balance/steadying.    Balance  Overall balance assessment: Needs assistance Sitting-balance support: No upper extremity supported;Feet supported Sitting balance-Leahy Scale: Fair     Standing balance support: Single extremity supported;During functional activity Standing balance-Leahy Scale: Poor Standing balance comment: Reliant on at least unilateral UE support.                           ADL either performed or assessed with clinical judgement   ADL Overall ADL's : Needs assistance/impaired     Grooming: Wash/dry hands;Wash/dry face;Min guard;Standing Grooming Details (indicate cue type and reason): Min guard for balance/steadying.             Lower Body Dressing: Min guard;Sit to/from stand Lower Body Dressing Details (indicate cue type and reason): Min guard for steadying/balance. Toilet Transfer: Magazine features editor Details (indicate cue type and reason): Min guard for balance/steadying. Toileting- Water quality scientist and Hygiene: Min guard;Sit to/from stand Toileting - Clothing Manipulation Details (indicate cue type and reason): Toileting/hygiene/clothing management with Min guard for steadying/safety.     Functional mobility during ADLs: Min guard;Minimal assistance General ADL Comments: Patient limited by decreased cognition and decreaed balance requiring Min guard to Min A grossly for ADLs.     Vision Baseline Vision/History: Wears glasses Wears Glasses: At all times Patient Visual Report: No change from baseline Vision Assessment?: No apparent visual deficits     Perception     Praxis      Pertinent Vitals/Pain Pain Assessment: Faces Faces Pain Scale: Hurts a little bit Pain Location: Low back Pain Descriptors / Indicators: Aching;Sore Pain Intervention(s): Monitored during session;Repositioned     Hand  Dominance Right   Extremity/Trunk Assessment Upper Extremity Assessment Upper Extremity Assessment: Generalized weakness   Lower Extremity Assessment Lower  Extremity Assessment: Defer to PT evaluation   Cervical / Trunk Assessment Cervical / Trunk Assessment: Kyphotic   Communication Communication Communication: No difficulties   Cognition Arousal/Alertness: Awake/alert Behavior During Therapy: WFL for tasks assessed/performed Overall Cognitive Status: Impaired/Different from baseline Area of Impairment: Orientation;Attention;Memory;Awareness                 Orientation Level: Disoriented to;Time;Situation Current Attention Level: Sustained Memory: Decreased short-term memory     Awareness: Emergent   General Comments: Patient reports day of birth incorrectly. Disoriented to time and situation stating she had a fall. Patient able to recall home set-up. Information confirmed by granddaughter via phone call.   General Comments  Granddaughter called during OT eval noting patient with improved cognition compared to time of admission.    Exercises     Shoulder Instructions      Home Living Family/patient expects to be discharged to:: Private residence Living Arrangements: Other (Comment) (Grandson (106 y.o.)) Available Help at Discharge: Family;Available 24 hours/day Type of Home: House Home Access: Level entry     Home Layout: One level     Bathroom Shower/Tub: Occupational psychologist: Handicapped height     Home Equipment: [place] - single point;Walker - 4 wheels;Grab bars - tub/shower          Prior Functioning/Environment Level of Independence: Needs assistance  Gait / Transfers Assistance Needed: SPC in community. ADL's / Homemaking Assistance Needed: Independent with ADLs/IADLs without AD            OT Problem List: Impaired balance (sitting and/or standing);Decreased cognition;Decreased safety awareness;Decreased knowledge of use of DME or AE      OT Treatment/Interventions: Self-care/ADL training;Therapeutic exercise;DME and/or AE instruction;Therapeutic activities;Cognitive  remediation/compensation;Patient/family education;Balance training    OT Goals(Current goals can be found in the care plan section) Acute Rehab OT Goals Patient Stated Goal: To return home. OT Goal Formulation: With patient Time For Goal Achievement: 02/13/21 Potential to Achieve Goals: Good ADL Goals Pt Will Perform Grooming: with modified independence;standing Pt Will Perform Upper Body Dressing: sitting;Independently Pt Will Perform Lower Body Dressing: with modified independence;sit to/from stand Pt Will Transfer to Toilet: with modified independence;ambulating Pt Will Perform Toileting - Clothing Manipulation and hygiene: with modified independence;sit to/from stand Pt Will Perform Tub/Shower Transfer: Shower transfer;3 in 1  OT Frequency: Min 3X/week   Barriers to D/C:            Co-evaluation              AM-PAC OT "6 Clicks" Daily Activity     Outcome Measure Help from another person eating meals?: None Help from another person taking care of personal grooming?: A Little Help from another person toileting, which includes using toliet, bedpan, or urinal?: A Little Help from another person bathing (including washing, rinsing, drying)?: A Little Help from another person to put on and taking off regular upper body clothing?: A Little Help from another person to put on and taking off regular lower body clothing?: A Little 6 Click Score: 19   End of Session Equipment Utilized During Treatment: Gait belt Nurse Communication: Mobility status  Activity Tolerance: Patient tolerated treatment well Patient left: in bed;with call bell/phone within reach;with bed alarm set  OT Visit Diagnosis: Unsteadiness on feet (R26.81);Muscle weakness (generalized) (M62.81);History of falling (Z91.81);Other symptoms and signs involving cognitive function  Time: 3013-1438 OT Time Calculation (min): 22 min Charges:  OT General Charges $OT Visit: 1 Visit OT Evaluation $OT  Eval Low Complexity: 1 Low  Lucca Greggs H. OTR/L Supplemental OT, Department of rehab services [phone]  Deshanna Kama R H. 01/30/2021, 8:52 AM
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
## 2021-01-30 NOTE — Progress Notes (Signed)
Stroke Documentation   Andrea Jackson is 87 year old who presented to [facility] on 01/29/21 from [facility]. The patient has a history of HTN, HLD, GERD, anxiety, hypothyroidism, previous GI bleed, and history of aortic dissection.  She was brought to the hospital because of AMS. Her LKW was 1000.  She is not taking any anticoagulation.  She had a NIHSS of 6.  The patient got a CT head and CTA.  The patient was given TPA at 1225. Due to lack of ICU bed at the moment, the patient will go back to 3W until a bed becomes available.    Enos Fling

## 2021-01-30 NOTE — Progress Notes (Signed)
PT Cancellation Note  Patient Details Name: Andrea Jackson MRN: [id number] DOB: 12/18/1933   Cancelled Treatment:    Reason Eval/Treat Not Completed: Medical issues which prohibited therapy Code stroke called. Patient taken off unit to imaging. PT will re-attempt as time allows.   Ellawyn Wogan A. Gilford Rile PT, DPT Acute Rehabilitation Services Pager [phone] Office [phone]    Linna Hoff 01/30/2021, 12:18 PM

## 2021-01-30 NOTE — Procedures (Addendum)
Patient Name: Andrea Jackson  MRN: [id number]  Epilepsy Attending: Lora Havens  Referring Physician/Provider: Dr Ileene Musa Date: 01/30/2021 Duration: 23.32 mins  Patient history: 87yo F with ams. EEG to evaluate for seizure  Level of alertness: Awake  AEDs during EEG study: None  Technical aspects: This EEG study was done with scalp electrodes positioned according to the 10-20 International system of electrode placement. Electrical activity was acquired at a sampling rate of 500Hz  and reviewed with a high frequency filter of 70Hz  and a low frequency filter of 1Hz . EEG data were recorded continuously and digitally stored.   Description: The posterior dominant rhythm consists of 8 Hz activity of moderate voltage (25-35 uV) seen predominantly in posterior head regions, symmetric and reactive to eye opening and eye closing. EEG showed continuous 3 to 6 Hz theta-delta slowing in left hemisphere.. Hyperventilation and photic stimulation were not performed.     Of note, study was technically difficult due to significant movement artifact.  ABNORMALITY - Continuous slow, left hemisphere  IMPRESSION: This technically difficult study is suggestive of cortical dysfunction arising from left hemisphere, likely secondary to underlying structural abnormality, post-ictal state, HSV encephalitis. No seizures or epileptiform discharges were seen throughout the recording.  Dr Rory Percy was notified.  Makenzi Bannister Barbra Sarks

## 2021-01-30 NOTE — Progress Notes (Signed)
PHARMACIST CODE STROKE RESPONSE  Notified to mix tPA at 1217 by Dr. Rory Percy Delivered tPA to RN at 1220  tPA dose = 4.9mg  bolus over 1 minute followed by 44.2mg  for a total dose of 49.1mg  over 1 hour  Issues/delays encountered (if applicable): Pt wt and BP control prior to tPA administration  Andrea Jackson 01/30/21 12:47 PM

## 2021-01-30 NOTE — Progress Notes (Signed)
Informed by Dr. Rory Percy that the patient was going to the ICU after tPA.  Neurology team as primary attending at this time.
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
## 2021-01-30 NOTE — Progress Notes (Signed)
Pharmacy Antibiotic Note  Andrea Jackson is a 87 y.o. female admitted on 01/29/2021 as code stroke, concern for HSV encephalitis.  Pharmacy has been consulted for acyclovir dosing.  Plan: Acyclovir 10mg /kg (545mg ) IV every 12 hours Add IVF, BMP while on acyclovir tx Monitor renal function, HSV workup and neuro recs  Weight: 54.6 kg (120 lb 5.9 oz)  Temp (24hrs), Avg:98.1 F (36.7 C), Min:97.7 F (36.5 C), Max:98.4 F (36.9 C)  No results for input(s): WBC, CREATININE, LATICACIDVEN, VANCOTROUGH, VANCOPEAK, VANCORANDOM, GENTTROUGH, GENTPEAK, GENTRANDOM, TOBRATROUGH, TOBRAPEAK, TOBRARND, AMIKACINPEAK, AMIKACINTROU, AMIKACIN in the last 168 hours.  CrCl cannot be calculated (Patient's most recent lab result is older than the maximum 21 days allowed.).    Allergies  Allergen Reactions  . Demerol [Meperidine] Swelling    Patient stated she had a "reaction" to Demerol. Swollen lip.  . Celecoxib Other (See Comments)    REACTION: hyper, couldn't eat or sleep  . Codeine Itching  . Oxycodone-Acetaminophen Itching  . Statins Other (See Comments)    Muscle aches, can not tolerate any of them per patient.      Bertis Ruddy, PharmD Clinical Pharmacist ED Pharmacist Phone # [phone] 01/30/2021 4:04 PM

## 2021-01-31 ENCOUNTER — Inpatient Hospital Stay (HOSPITAL_COMMUNITY): Payer: Medicare Other

## 2021-01-31 DIAGNOSIS — G459 Transient cerebral ischemic attack, unspecified: Secondary | ICD-10-CM | POA: Diagnosis not present

## 2021-01-31 DIAGNOSIS — R569 Unspecified convulsions: Principal | ICD-10-CM

## 2021-01-31 DIAGNOSIS — R410 Disorientation, unspecified: Secondary | ICD-10-CM | POA: Diagnosis not present

## 2021-01-31 LAB — URINALYSIS, ROUTINE W REFLEX MICROSCOPIC
Bilirubin Urine: NEGATIVE
Glucose, UA: NEGATIVE mg/dL
Ketones, ur: 5 mg/dL — AB
Nitrite: NEGATIVE
Protein, ur: NEGATIVE mg/dL
Specific Gravity, Urine: 1.02 (ref 1.005–1.030)
pH: 5 (ref 5.0–8.0)

## 2021-01-31 LAB — RAPID URINE DRUG SCREEN, HOSP PERFORMED
Amphetamines: NOT DETECTED
Barbiturates: NOT DETECTED
Benzodiazepines: NOT DETECTED
Cocaine: NOT DETECTED
Opiates: NOT DETECTED
Tetrahydrocannabinol: NOT DETECTED

## 2021-01-31 LAB — BASIC METABOLIC PANEL
Anion gap: 9 (ref 5–15)
BUN: 21 mg/dL (ref 8–23)
CO2: 26 mmol/L (ref 22–32)
Calcium: 8.7 mg/dL — ABNORMAL LOW (ref 8.9–10.3)
Chloride: 103 mmol/L (ref 98–111)
Creatinine, Ser: 0.89 mg/dL (ref 0.44–1.00)
GFR, Estimated: >60 mL/min (ref >60)
Glucose, Bld: 98 mg/dL (ref 70–99)
Potassium: 3.5 mmol/L (ref 3.5–5.1)
Sodium: 138 mmol/L (ref 135–145)

## 2021-01-31 LAB — ECHOCARDIOGRAM COMPLETE
Area-P 1/2: 4.6 cm2
P 1/2 time: 476 msec
S' Lateral: 2.5 cm
Weight: 1925.94 oz

## 2021-01-31 MED ORDER — ASPIRIN EC 81 MG PO TBEC
81.0000 mg | DELAYED_RELEASE_TABLET | Freq: Every day | ORAL | Status: DC
  Administered 2021-01-31 – 2021-02-01 (×2): 81 mg via ORAL
  Filled 2021-01-31 (×2): qty 1

## 2021-01-31 MED ORDER — GADOBENATE DIMEGLUMINE 529 MG/ML IV SOLN
5.0000 mL | Freq: Once | INTRAVENOUS | Status: AC | PRN
  Administered 2021-01-31: 5 mL via INTRAVENOUS

## 2021-01-31 NOTE — Progress Notes (Signed)
  Echocardiogram 2D Echocardiogram has been performed.  Andrea Jackson 01/31/2021, 10:53 AM

## 2021-01-31 NOTE — Procedures (Addendum)
Patient Name: Andrea Jackson  MRN: [id number]  Epilepsy Attending: Lora Havens  Referring Physician/Provider: Dr Amie Portland Duration: 01/30/2021 1351 to 01/31/2021 0819  Patient history: 86yo F with ams. EEG to evaluate for seizure  Level of alertness: Awake, asleep  AEDs during EEG study: LEV  Technical aspects: This EEG study was done with scalp electrodes positioned according to the 10-20 International system of electrode placement. Electrical activity was acquired at a sampling rate of 500Hz  and reviewed with a high frequency filter of 70Hz  and a low frequency filter of 1Hz . EEG data were recorded continuously and digitally stored.   Description: The posterior dominant rhythm consists of 8 Hz activity of moderate voltage (25-35 uV) seen predominantly in posterior head regions, symmetric and reactive to eye opening and eye closing. Sleep was characterized by vertex waves, sleep spindles (12-14Hz ), maximal frontocentral region. EEG showed continuous 3 to 6 Hz theta-delta slowing in left hemisphere, maximal left frontal region. At the beginning of the study, quasi-PLED like spikes were noted in left frontal region at almost 1Hz  which gradually improved in frequency. Hyperventilation and photic stimulation were not performed.     ABNORMALITY - Spikes, left frontal region - Continuous slow, left hemisphere, maximal frontal region  IMPRESSION: This study is suggestive of epileptogenicity in left frontal region as well as cortical dysfunction arising from left hemisphere,  likely secondary to underlying structural abnormality, post-ictal state. No seizures were seen throughout the recording.  Rebacca Votaw Barbra Sarks

## 2021-01-31 NOTE — Progress Notes (Signed)
EEG unhooked, no skin break seen 

## 2021-01-31 NOTE — Progress Notes (Signed)
Transferred to [address]. Bedside handoff to [facility]. Family notified of pt's new location.

## 2021-01-31 NOTE — Progress Notes (Addendum)
NEUROLOGY PROGRESS NOTE Post tPA Neurohospitalist Service   INTERVAL HISTORY No family is at the bedside.  Patient is awake, alert and oriented this am.  Moving all extremities.  Patient to have 24 hr s/p TPA MRI brain today @ 1230.  No voiced complaints.  RN at bedside and updated on plan.  Vitals:   01/31/21 0600 01/31/21 0700 01/31/21 0800 01/31/21 0900  BP: (!) 153/62 (!) 161/75 (!) 175/90 (!) 155/66  Pulse: 74 75 78 73  Resp: (!) 25 (!) 24 18 (!) 24  Temp:   98.2 F (36.8 C)   TempSrc:   Oral   SpO2: 100% 95% 99% 97%  Weight:       CBC: No results for input(s): WBC, NEUTROABS, HGB, HCT, MCV, PLT in the last 168 hours. Basic Metabolic Panel:  Recent Labs  Lab 01/30/21 1638 01/31/21 0304  NA  --  138  K  --  3.5  CL  --  103  CO2  --  26  GLUCOSE  --  98  BUN  --  21  CREATININE 0.95 0.89  CALCIUM  --  8.7*   Lipid Panel:  Recent Labs  Lab 01/30/21 0159  CHOL 220*  TRIG 103  HDL 62  CHOLHDL 3.5  VLDL 21  LDLCALC 137*   HgbA1c:  Recent Labs  Lab 01/30/21 0159  HGBA1C 5.9*   B12 3755-in February Vitamin D 99.4 and February  IMAGING past 24 hours EEG adult  Result Date: 01/30/2021 Lora Havens, MD     01/31/2021  6:56 AM Patient Name: Andrea Jackson MRN: [id number] Epilepsy Attending: Lora Havens Referring Physician/Provider: Dr Ileene Musa Date: 01/30/2021 Duration: 23.32 mins Patient history: 87yo F with ams. EEG to evaluate for seizure Level of alertness: Awake AEDs during EEG study: None Technical aspects: This EEG study was done with scalp electrodes positioned according to the 10-20 International system of electrode placement. Electrical activity was acquired at a sampling rate of 500Hz  and reviewed with a high frequency filter of 70Hz  and a low frequency filter of 1Hz . EEG data were recorded continuously and digitally stored. Description: The posterior dominant rhythm consists of 8 Hz activity of moderate voltage (25-35 uV) seen predominantly in  posterior head regions, symmetric and reactive to eye opening and eye closing. EEG showed continuous 3 to 6 Hz theta-delta slowing in left hemisphere.. Hyperventilation and photic stimulation were not performed.   Of note, study was technically difficult due to significant movement artifact. ABNORMALITY - Continuous slow, left hemisphere IMPRESSION: This technically difficult study is suggestive of cortical dysfunction arising from left hemisphere, likely secondary to underlying structural abnormality, post-ictal state, HSV encephalitis. No seizures or epileptiform discharges were seen throughout the recording. Dr Rory Percy was notified. Priyanka Barbra Sarks   Overnight EEG with video  Result Date: 01/31/2021 Lora Havens, MD     01/31/2021  7:09 AM Patient Name: Andrea Jackson MRN: [id number] Epilepsy Attending: Lora Havens Referring Physician/Provider: Dr Amie Portland Duration: 01/30/2021 1351 to 01/31/2021 0700  Patient history: 87yo F with ams. EEG to evaluate for seizure  Level of alertness: Awake, asleep  AEDs during EEG study: LEV  Technical aspects: This EEG study was done with scalp electrodes positioned according to the 10-20 International system of electrode placement. Electrical activity was acquired at a sampling rate of 500Hz  and reviewed with a high frequency filter of 70Hz  and a low frequency filter of 1Hz . EEG data were recorded continuously and digitally stored.  Description: The posterior dominant rhythm consists of 8 Hz activity of moderate voltage (25-35 uV) seen predominantly in posterior head regions, symmetric and reactive to eye opening and eye closing. Sleep was characterized by vertex waves, sleep spindles (12-14Hz ), maximal frontocentral region. EEG showed continuous 3 to 6 Hz theta-delta slowing in left hemisphere, maximal left frontal region. At the beginning of the study, quasi-PLED like spikes were noted in left frontal region at almost 1Hz  which gradually improved in frequency.  Hyperventilation and photic stimulation were not performed.    ABNORMALITY - Spikes, left frontal region - Continuous slow, left hemisphere, maximal frontal region  IMPRESSION: This study is suggestive of epileptogenicity in left frontal region as well as cortical dysfunction arising from left hemisphere,  likely secondary to underlying structural abnormality, post-ictal state. No seizures were seen throughout the recording.  Lora Havens   CT HEAD CODE STROKE WO CONTRAST  Result Date: 01/30/2021 CLINICAL DATA:  Code stroke. Acute neuro deficit. Slurred speech and left-sided weakness EXAM: CT HEAD WITHOUT CONTRAST TECHNIQUE: Contiguous axial images were obtained from the base of the skull through the vertex without intravenous contrast. COMPARISON:  CT head 01/29/2021 FINDINGS: Brain: Mild frontal lobe atrophy. Ventricle size within normal limits. Moderate white matter changes with patchy hypodensity in the frontal white matter bilaterally unchanged from yesterday. Negative for acute infarct, acute hemorrhage, mass. Vascular: Negative for hyperdense vessel. Atherosclerotic calcification in the carotid and vertebral arteries bilaterally. Skull: Negative Sinuses/Orbits: Paranasal sinuses clear. Bilateral cataract extraction Other: None ASPECTS (Randall Stroke Program Early CT Score) - Ganglionic level infarction (caudate, lentiform nuclei, internal capsule, insula, M1-M3 cortex): 7 - Supraganglionic infarction (M4-M6 cortex): 3 Total score (0-10 with 10 being normal): 10 IMPRESSION: 1. No acute abnormality. 2. Frontal lobe atrophy. Patchy white matter hypodensity compatible with chronic microvascular ischemia. 3. ASPECTS is 10 4. Code stroke imaging results were communicated on 01/30/2021 at 12:01 pm to provider Rory Percy via text page Electronically Signed   By: Franchot Gallo M.D.   On: 01/30/2021 12:03   CT ANGIO HEAD NECK W WO CM (CODE STROKE)  Result Date: 01/30/2021 CLINICAL DATA:  Acute neuro deficit.  Slurred speech and left-sided weakness. EXAM: CT ANGIOGRAPHY HEAD AND NECK TECHNIQUE: Multidetector CT imaging of the head and neck was performed using the standard protocol during bolus administration of intravenous contrast. Multiplanar CT image reconstructions and MIPs were obtained to evaluate the vascular anatomy. Carotid stenosis measurements (when applicable) are obtained utilizing NASCET criteria, using the distal internal carotid diameter as the denominator. CONTRAST:  30mL OMNIPAQUE IOHEXOL 350 MG/ML SOLN COMPARISON:  CT head 01/30/2021 FINDINGS: CTA NECK FINDINGS Aortic arch: Standard branching. Imaged portion shows no evidence of aneurysm or dissection. No significant stenosis of the major arch vessel origins. Mild atherosclerotic disease aortic arch and proximal left subclavian artery Right carotid system: Right carotid widely patent without significant stenosis. Minimal atherosclerotic disease at the bifurcation Left carotid system: Left carotid widely patent without stenosis. Mild atherosclerotic calcification at the bifurcation. Vertebral arteries: Both vertebral arteries patent to the skull base without stenosis. Skeleton: Cervical spondylosis.  No acute abnormality. Other neck: Negative for mass or edema in the neck. Upper chest: Lung apices clear bilaterally. Review of the MIP images confirms the above findings CTA HEAD FINDINGS Anterior circulation: Mild atherosclerotic calcification in the cavernous carotid bilaterally without stenosis. 2 mm aneurysm left posterior communicating artery region. Fetal origin right posterior communicating artery. Anterior and middle cerebral arteries patent bilaterally with the without stenosis or large vessel  occlusion Posterior circulation: Atherosclerotic calcification in the distal vertebral artery bilaterally with mild stenosis on the right and moderate stenosis on the left. PICA patent bilaterally. Basilar widely patent. AICA, superior cerebellar, and  posterior cerebral arteries patent bilaterally without stenosis. Fetal origin right posterior cerebral artery. Venous sinuses: Normal venous enhancement Anatomic variants: None Review of the MIP images confirms the above findings IMPRESSION: 1. Negative for intracranial large vessel occlusion or flow limiting stenosis. 2. 2 mm aneurysm left posterior communicating artery region. 3. Mild atherosclerotic disease in the carotid bifurcation bilaterally. No significant carotid stenosis 4. Mild stenosis distal right vertebral artery and moderate stenosis distal left vertebral artery. Electronically Signed   By: Franchot Gallo M.D.   On: 01/30/2021 13:48    PHYSICAL EXAM GENERAL: Awake, alert in NAD HENT: - Normocephalic and atraumatic, dry mm Eyes: normal conjunctiva LUNGS - Clear to auscultation bilaterally with no wheezes CV - S1S2 RRR, no m/r/g, equal pulses bilaterally. ABDOMEN - Soft, nontender, nondistended with normoactive BS Ext: warm, well perfused, intact peripheral pulses, no edema Psych: normal mood and affect   NEURO:  Mental Status: AA&Ox3  Language: speech is clear.  Naming, repetition, fluency, and comprehension intact. Cranial Nerves: PERRL 62mm/brisk. EOMI, visual fields full, no facial asymmetry, facial sensation intact, hearing intact, tongue/uvula/soft palate midline, normal sternocleidomastoid and trapezius muscle strength. No evidence of tongue atrophy or fibrillations Motor: 5/5 in all 4 extremities Tone: is normal and bulk is normal Sensation- Intact to light touch bilaterally Coordination: FTN intact bilaterally, no ataxia in BLE. Gait- deferred  NIHSS 0  ASSESSMENT/PLAN [name] is a 87 y.o. female with history of HTN, HLD, GERD, anxiety, hypothyroidism, history of GI bleed, and history of aortic dissection who presented to the ED for concerns of AMS. Per, daughter [name] via phone and she states that yesterday patient stayed in bed and was only mumbling. She  would not answer questions and seemed to be staring off. No signs of tremors or shaking or seizure like activity was noted. Per daughter, she is able to take care of her self, drives and doses her own medicine On 5/21 patient was back to baseline on examination, then ~2hrs later 1130 patient was found to have a complete change in neurological exam with NIHSS 0 -->NIHSS 6. LKW 1000am. At this time she was unable to answer questions, appeared to be aphasic and would only answer "yes" or "fine". Code stroke was activated due to acute change. She was deemed a candidate for IV TPA and received @1225 . EEG was ordered.   IV tPA given for concern for possible stroke- initial NIH on presentation to the hospital was 0 and worsened to 6 within window for IV tPA. Other differentials include metabolic encephalopathy, hypertensive encephalopathy and HSV encephalitis.  Code Stroke CT head  No acute abnormality.Frontal lobe atrophy. Patchy white matter hypodensity compatible with chronic microvascular ischemia ASPECTS 10.   CTA head & neck  1. Negative for intracranial large vessel occlusion or flow limiting stenosis. 2. 2 mm aneurysm left posterior communicating artery region. 3. Mild atherosclerotic disease in the carotid bifurcation bilaterally. No significant carotid stenosis 4. Mild stenosis distal right vertebral artery and moderate stenosis distal left vertebral artery.  MRI-with and without contrast-no stroke.  No bleed.  Not suggestive of HSV encephalitis.  2D Echo-LVEF 65 to 70%, mild LVH, normal RV function, normal mitral valve, normal aortic valve.  Mild dilatation of ascending aorta-37 mm.  Left atrium normal in size.  LDL 137-goal  less than 70.  HgbA1c 5.9  VTE prophylaxis - SCD's   rEEG -Continuous slow, left hemisphere  cEEG - Spikes, left frontal region. Continuous slow,left hemisphere, maximal frontal region. No seizures     Diet   Diet Heart Room service appropriate? Yes; Fluid  consistency: Thin     No antithrombotic prior to admission, now on No antithrombotic. 24hr s/p TPA imaging today showed negative for bleed, start aspirin.  Therapy recommendations:  Pending   Disposition:  Pending   From an encephalopathy work-up standpoint-check urinalysis and TSH.  In February her B12 was normal.  Possible seizure and abnormal EEG Concern for structural lesion such as HSV encephalitis -Keppra 500 BID - continue -Evaluate for underlying structural abnormality such as HSV encephalitis-patient very reluctant for an LP.  Will do MRI of the brain with and without contrast, and if done for any temporal lesion, will strongly recommend LP, otherwise if the exam remained stable- and she does not really want an LP, we would not perform an LP. -Continue acyclovir for now.  Further recommendations based on MRI  Hypertension, question hypertensive encephalopathy  Home meds:  Amlodipine 5mg , metoprolol 12.5 mg   Stable . Goal s/p IV TPA 180/105 . Long-term BP goal normotensive  Hyperlipidemia  Home meds:  none,   LDL 137, goal < 70  Add atorvastatin 40 mg   Continue statin at discharge  Diabetes type Controlled  Home meds: none   HgbA1c 5.9, goal < 7.0   Other Stroke Risk Factors  Advanced Age >/= 66   ETOH use, alcohol level advised to drink no more than 1 drink(s) a day  Coronary artery disease  Other Active Problems  Hypothyroidism 140mcg levothyroxine      Hospital day # [place], [facility]    Attending addendum Patient seen and examined this morning No complaints-back to baseline. Daughter at bedside Examination: Vitals as noted above General: Awake alert in no distress HEENT: Normocephalic/atraumatic CVs: Regular rhythm Abdomen nondistended nontender Extremities warm well perfused Respiratory: Breathing well saturating normally on room air Neurological exam Awake, alert, oriented to self and the fact that she is in the  hospital.  Was able to tell me the correct month and her age correctly today.  Yesterday was unable to tell me her correct age.  Diminished attention concentration. No evidence of dysarthria No evidence of aphasia Cranial nerves II to XII intact Motor exam with no drift in any of the 4 extremities Sensation intact light touch all over without extinction Coordination with no dysmetria  Routine EEG concerning for left-sided slowing-postictal versus structural abnormality such as an infection like HSV encephalitis, started on acyclovir.  This was followed by overnight continuous EEG. Continuous EEG overnight shows left frontal spikes without any seizure as well as continuous slowing on the left hemisphere maximal in the frontal region-suggestive of epileptogenicity in the left frontal region as well as cortical dysfunction arising from the left hemisphere.  IV tPA given due to sudden change in mentation yesterday concerning for stroke.  NIH at that time was 6. Today NIH stroke scale 0.  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 Her mental status is back to baseline. She has had 2 events  of confusion-mostly aphasia in the last 3 to 4 days. EEG suggestive of left hemispheric dysfunction without any structural abnormality. HSV encephalitis is in the considerations but less likely given otherwise unremarkable noninfectious picture.  Extremely reluctant for LP- will hold off.  I would at this time, continue Keppra 500 twice daily for her with the presumed diagnosis of underlying seizures.  I will also start her on aspirin 81-MRI negative for bleed 24-hour post tPA  Atorvastatin 40  now and daily for goal of LDL less than 70-patient has allergy to statins-all of them.  Would recommend dietary modification and outpatient follow-up and trial of statins.  Check urinalysis and TSH  If she has another episode, might need LP at that time along with ongoing LTM EEG.  Rest of the recommendations as above in the stroke note.  Discussed her care with her granddaughter, daughter at bedside.  Daughter reports no cognitive deficits but granddaughter reports that she has been more reclusive ever since [place], has avoided going to church, and believes that there might be some diminishing of cognitive abilities.  Needs outpatient neurology follow-up with formal neuropsychological testing.  Transfer patient to the telemetry floor.  Hospitalist team primary from 02/01/2021-Dr. Pokhrel aware.  Patient placement RN notified.  Discussed with Dr. Louanne Belton over secure chat  -- Amie Portland, MD Neurologist [facility] Pager: [phone]    CRITICAL CARE ATTESTATION Performed by: Amie Portland, MD Total critical care time: 25minutes Critical care time was exclusive of separately billable procedures and treating other patients and/or supervising APPs/Residents/Students Critical care was necessary to treat or prevent imminent or life-threatening deterioration due to underlying seizures, concern for stroke, post thrombolytic This patient is critically ill and at significant risk for neurological worsening and/or death and care requires constant monitoring. Critical care was time spent personally by me on the following activities: development of treatment plan with patient and/or surrogate as well as nursing, discussions with consultants, evaluation of patient's response to treatment, examination of patient, obtaining history from patient or surrogate, ordering and performing treatments and interventions, ordering and review of laboratory studies, ordering and review of radiographic  studies, pulse oximetry, re-evaluation of patient's condition, participation in multidisciplinary rounds and medical decision making of high complexity in the care of this patient.

## 2021-01-31 NOTE — Evaluation (Addendum)
Physical Therapy Evaluation Patient Details Name: Andrea Jackson MRN: [id number] DOB: 22-Jul-1934 Today's Date: 01/31/2021   History of Present Illness  Pt is an 87 y.o. female who presented 5/21 with AMS of unclear etiology. During hospital course pt became aphasic, thus Code Stroke was called. tPA was administered. EEG was suggestive of epileptogenicity in left frontal region as well as cortical dysfunction arising from left hemisphere, likely secondary to underlying structural abnormality, post-ictal state. MRI of head was negative for acute intracranial abnormalities. Pt refusing LP at this time. PMHx significant for HTN, thyroid disease, CAD, NSTEMI, Hx of skin cancer, L hip fx 07/2020 s/p pinning,and HLD.    Clinical Impression  Pt presents with condition above and deficits mentioned below, see PT Problem List. PTA, she reports being independent with all functional mobility without UE support. She lives with her great-grandson in a house with 1 STE. Some of her info contradicts the info retrieved by the OT during their eval 5/21, thus pt may be poor historian. Currently, pt is displaying memory, word-finding, awareness, and sequencing deficits. In addition, she demonstrates slight L leg weakness, L UE and lower extremity dysmetria, L UE dysdiadochokinesia, and balance deficits that impact her safety and independence with all functional mobility. She is at high risk for falls and needed up to modA to recover when she staggered with her first L foot step without UE support this date. Pt with improved stability utilizing a RW, needing only minA to ambulate up to ~250 ft during one bout. Due to her cognition and balance deficits, pt would need 24/7 assistance going home at this time. She could benefit from follow-up with Outpatient PT in a neuro clinic to address her deficits and maximize her safety and independence with all functional mobility, providing she has someone who could drive her to appointments.  Will continue to follow acutely.    Follow Up Recommendations Outpatient PT;Supervision/Assistance - 24 hour (neuro clinic)    Equipment Recommendations  Rolling walker with 5" wheels (if unable to manage rollator safely)    Recommendations for Other Services       Precautions / Restrictions Precautions Precautions: Fall Restrictions Weight Bearing Restrictions: No      Mobility  Bed Mobility Overal bed mobility: Needs Assistance Bed Mobility: Supine to Sit     Supine to sit: HOB elevated;Min guard     General bed mobility comments: Min guard for safety with pt using bed rails and controls to transition to sit EOB.    Transfers Overall transfer level: Needs assistance Equipment used: 1 person hand held assist;Rolling walker (2 wheeled) Transfers: Sit to/from Stand Sit to Stand: Min assist;Min guard         General transfer comment: MinA to steady pt coming to stand without AD, but min guard when using RW.  Ambulation/Gait Ambulation/Gait assistance: Min assist;Mod assist Gait Distance (Feet): 250 Feet (x2 bouts of ~30 ft > ~250 ft) Assistive device: 1 person hand held assist;Rolling walker (2 wheeled) Gait Pattern/deviations: Step-through pattern;Decreased step length - right;Decreased stance time - left;Decreased dorsiflexion - right;Trunk flexed;Narrow base of support Gait velocity: reduced Gait velocity interpretation: 1.31 - 2.62 ft/sec, indicative of limited community ambulator General Gait Details: Initially tried to ambulate without UE support but pt immediately staggered on her L foot and had LOb needing modA to recover. Provided pt with 1 HHA and minA to steady to ambulate in room. For second bout, retrieved RW with pt only need minA for safety and intermittent steadying, displaying  decreased L stance time and thus R step length and foot clearance, cues to correct with min carryover.  Stairs            Wheelchair Mobility    Modified Rankin (Stroke  Patients Only) Modified Rankin (Stroke Patients Only) Pre-Morbid Rankin Score: No symptoms Modified Rankin: Moderately severe disability     Balance Overall balance assessment: Needs assistance Sitting-balance support: No upper extremity supported;Feet supported Sitting balance-Leahy Scale: Good Sitting balance - Comments: Able to donn socks EOB with min guard.   Standing balance support: Single extremity supported;Bilateral upper extremity supported;No upper extremity supported;During functional activity Standing balance-Leahy Scale: Fair Standing balance comment: Able ot stand statically without UE support but min guard-A for stability, but once mobilizing pt needs UE support and external assist.                             Pertinent Vitals/Pain Pain Assessment: 0-10 Pain Score: 5  Pain Location: L hip Pain Descriptors / Indicators: Discomfort;Guarding Pain Intervention(s): Limited activity within patient's tolerance;Monitored during session;Repositioned (notified RN)    Home Living Family/patient expects to be discharged to:: Private residence Living Arrangements: Other (Comment) (great grandson (96 y.o.)) Available Help at Discharge: Family;Available 24 hours/day Type of Home: House Home Access: Stairs to enter Entrance Stairs-Rails: None Entrance Stairs-Number of Steps: 1 Home Layout: One level Home Equipment: Cane - single point;Walker - 4 wheels;Grab bars - tub/shower;Shower seat;Bedside commode;Wheelchair - manual      Prior Function Level of Independence: Independent         Comments: Reports she does not use any AD unless she is feeling unwell, in which she then uses a SPC. This info contradicts the info retrieved by the OT 5/21, so unclear if pt is good historian.     Hand Dominance   Dominant Hand: Right    Extremity/Trunk Assessment   Upper Extremity Assessment Upper Extremity Assessment: LUE deficits/detail LUE Deficits / Details:  Dysmetria and dysdiadochokinesia noted; appears WFL and symmetrical strength bil; denies numbness/tingling LUE Coordination: decreased fine motor;decreased gross motor    Lower Extremity Assessment Lower Extremity Assessment: LLE deficits/detail LLE Deficits / Details: Slight weakness of grossly 4+ strength throughout compared to gross 5 strength in R; Dysmetria noted; denies numbness/tingling LLE Coordination: decreased fine motor;decreased gross motor    Cervical / Trunk Assessment Cervical / Trunk Assessment: Kyphotic  Communication   Communication: No difficulties  Cognition Arousal/Alertness: Awake/alert Behavior During Therapy: WFL for tasks assessed/performed Overall Cognitive Status: Impaired/Different from baseline Area of Impairment: Attention;Memory;Awareness;Following commands;Safety/judgement;Problem solving                   Current Attention Level: Sustained Memory: Decreased short-term memory Following Commands: Follows one step commands consistently;Follows one step commands with increased time Safety/Judgement: Decreased awareness of safety;Decreased awareness of deficits Awareness: Emergent Problem Solving: Slow processing;Difficulty sequencing;Requires verbal cues;Requires tactile cues General Comments: Pt having difficulty recalling correct year/date of birth and current but able to identify it with time. Pt with repetitive statements throughout and word finding difficulty. Pt needing cues to sequence tasks. Pt with poor awareness into her deficits and safety.      General Comments General comments (skin integrity, edema, etc.): SBP < 180 start of session, RN aware, BP increased to 190s/90s by end of session, notified RN    Exercises     Assessment/Plan    PT Assessment Patient needs continued PT services  PT Problem List  Decreased strength;Decreased activity tolerance;Decreased balance;Decreased mobility;Decreased coordination;Decreased  cognition;Decreased knowledge of use of DME;Decreased safety awareness       PT Treatment Interventions DME instruction;Gait training;Stair training;Functional mobility training;Therapeutic exercise;Therapeutic activities;Balance training;Neuromuscular re-education;Cognitive remediation;Patient/family education    PT Goals (Current goals can be found in the Care Plan section)  Acute Rehab PT Goals Patient Stated Goal: to improve PT Goal Formulation: With patient Time For Goal Achievement: 02/14/21 Potential to Achieve Goals: Good    Frequency Min 3X/week   Barriers to discharge        Co-evaluation               AM-PAC PT "6 Clicks" Mobility  Outcome Measure Help needed turning from your back to your side while in a flat bed without using bedrails?: A Little Help needed moving from lying on your back to sitting on the side of a flat bed without using bedrails?: A Little Help needed moving to and from a bed to a chair (including a wheelchair)?: A Little Help needed standing up from a chair using your arms (e.g., wheelchair or bedside chair)?: A Little Help needed to walk in hospital room?: A Little Help needed climbing 3-5 steps with a railing? : A Little 6 Click Score: 18    End of Session Equipment Utilized During Treatment: Gait belt Activity Tolerance: Patient tolerated treatment well Patient left: in chair;with call bell/phone within reach;with chair alarm set Nurse Communication: Mobility status;Other (comment) (BP, pain in hip) PT Visit Diagnosis: Unsteadiness on feet (R26.81);Other abnormalities of gait and mobility (R26.89);Muscle weakness (generalized) (M62.81);Difficulty in walking, not elsewhere classified (R26.2);Other symptoms and signs involving the nervous system (R29.898)    Time: 1245-8099 PT Time Calculation (min) (ACUTE ONLY): 38 min   Charges:   PT Evaluation $PT Eval Moderate Complexity: 1 Mod PT Treatments $Gait Training: 8-22  mins $Therapeutic Activity: 8-22 mins        Moishe Spice, PT, DPT Acute Rehabilitation Services  Pager: [phone] Office: [place] 01/31/2021, 4:43 PM

## 2021-02-01 DIAGNOSIS — E039 Hypothyroidism, unspecified: Secondary | ICD-10-CM | POA: Diagnosis not present

## 2021-02-01 DIAGNOSIS — R569 Unspecified convulsions: Secondary | ICD-10-CM | POA: Diagnosis not present

## 2021-02-01 DIAGNOSIS — I10 Essential (primary) hypertension: Secondary | ICD-10-CM | POA: Diagnosis not present

## 2021-02-01 LAB — BASIC METABOLIC PANEL
Anion gap: 5 (ref 5–15)
BUN: 13 mg/dL (ref 8–23)
CO2: 27 mmol/L (ref 22–32)
Calcium: 8.8 mg/dL — ABNORMAL LOW (ref 8.9–10.3)
Chloride: 106 mmol/L (ref 98–111)
Creatinine, Ser: 0.73 mg/dL (ref 0.44–1.00)
GFR, Estimated: >60 mL/min (ref >60)
Glucose, Bld: 100 mg/dL — ABNORMAL HIGH (ref 70–99)
Potassium: 3.6 mmol/L (ref 3.5–5.1)
Sodium: 138 mmol/L (ref 135–145)

## 2021-02-01 MED ORDER — LEVETIRACETAM 500 MG PO TABS: 500.0000 mg | ORAL_TABLET | Freq: Two times a day (BID) | ORAL | Status: DC

## 2021-02-01 MED ORDER — LEVETIRACETAM 500 MG PO TABS: 500.0000 mg | ORAL_TABLET | Freq: Two times a day (BID) | ORAL | 2 refills | Status: AC

## 2021-02-01 NOTE — Progress Notes (Signed)
Neurology Progress Note  S: No overnight events. Patient states she is feeling fine. No HA, vision changes, jerking/twiching, or weakness of a isolated limb. She states she is still confused about what happened at home to bring her to the hospital, but she could recite what her family members told her. She wants to go home.    O: Current vital signs: BP (!) 167/93 (BP Location: Right Arm)   Pulse 72   Temp 98.1 F (36.7 C)   Resp 20   Wt 54.6 kg   SpO2 97%   BMI 21.32 kg/m  Vital signs in last 24 hours: Temp:  [96.3 F (35.7 C)-98.2 F (36.8 C)] 98.1 F (36.7 C) (05/23 0731) Pulse Rate:  [71-85] 72 (05/23 0731) Resp:  [15-24] 20 (05/23 0731) BP: (137-191)/(57-147) 167/93 (05/23 0731) SpO2:  [94 %-100 %] 97 % (05/23 0731)  GENERAL: Awake, alert in NAD. Appears well.  HEENT: Normocephalic and atraumatic LUNGS: Normal respiratory effort.  CV: RRR. Ext: warm  NEURO:  Mental Status: Alert. Oriented to name, age, place, month, year, date, but not to [place]. She says we are in the [place].  Speech/Language: speech is without aphasia or dysarthria.  Naming, repetition, fluency, and comprehension intact.  Cranial Nerves:  II: PERRL. Visual fields full.  III, IV, VI: EOMI. Eyelids elevate symmetrically.  V: Sensation is intact to light touch and symmetrical to face.  VII: Smile is symmetrical. VIII: hearing intact to voice. IX, X: Palate elevates symmetrically. Phonation is normal.  EZ:MOQHUTML shrug 5/5. XII: tongue is midline without fasciculations. Motor: 5/5 to all muscle groups except for 4/5 bilateral triceps. Moves all extremities purposefully and spontaneously.  Tone: is normal and bulk is normal Sensation- Intact to light touch bilaterally. Extinction absent to light touch to DSS.    Coordination: FTN intact bilaterally. No drift.  DTRs: Bilateral grip 5/5. Bilateral brachioradalis 1+/5. Bilateral biceps 2+, Bilateral triceps 1+.  Gait-  deferred  Medications  Current Facility-Administered Medications:  .   stroke: mapping our early stages of recovery book, , Does not apply, Once, Tu, Ching T, DO .  aspirin EC tablet 81 mg, 81 mg, Oral, Daily, Amie Portland, MD, 81 mg at 01/31/21 1625 .  Chlorhexidine Gluconate Cloth 2 % PADS 6 each, 6 each, Topical, Daily, Amie Portland, MD, 6 each at 01/30/21 1630 .  hydrALAZINE (APRESOLINE) injection 10 mg, 10 mg, Intravenous, Q4H PRN, Amie Portland, MD, 10 mg at 01/30/21 1311 .  labetalol (NORMODYNE) injection 10 mg, 10 mg, Intravenous, Q2H PRN, Amie Portland, MD, 10 mg at 01/31/21 1648 .  levETIRAcetam (KEPPRA) tablet 500 mg, 500 mg, Oral, BID, Pokhrel, Laxman, MD .  levothyroxine (SYNTHROID) tablet 150 mcg, 150 mcg, Oral, QAC breakfast, Tu, Ching T, DO, 150 mcg at 01/31/21 0808  Imaging/Testing CT Head Atrophy with chronic minimal small vessel ischemic disease. No acute intracranial abnormality is noted. 24 hour post tPA MRI Brain  No acute intracranial abnormality. CTA head and neck Negative for intracranial large vessel occlusion or flow limiting stenosis. 2 mm aneurysm left posterior communicating artery region. Mild atherosclerotic disease in the carotid bifurcation bilaterally. No significant carotid stenosis. Mild stenosis distal right vertebral artery and moderate stenosis distal left vertebral artery. EEG #1 read This technically difficult study is suggestive of cortical dysfunction arising from left hemisphere, likely secondary to underlying structural abnormality, post-ictal state, HSV encephalitis. No seizures or epileptiform discharges were seen throughout the recording. EEG #2 (LTM) with video Thisstudy is suggestive of epileptogenicity  in left frontal region as well as cortical dysfunction arising from lefthemisphere,  likely secondary to underlying structural abnormality, post-ictal state. No seizures were seen throughout the recording.  Assessment: 87 yo female who  was brought in as a transfer from [facility] on 01/30/21 after being found at home to just be mumbling and not answering questions. Patient is normally independent in ADLs and MRS 0. -She was back to baseline on arrival.  -However, about 2 hours later, patient was noted to have an acute change in mental status and a code stroke was called. Because she was still in the tPA window, this was started.  -Due to her presentation of AMS, seizure was considered, and EEG was abnormal. Keppra was started.  -There was also concern for HSV encephalitis. Patient did not want to have LP, but Acyclovir was started pending MRI brain with and without contrast. This showed no underlying structural abnormality or other acute finding.  -Acyclovir was stopped.  -EEG was continued overnight and showed suggestive epileptogenicity in left frontal region as well as cortical dysfunction, so Keppra was continued.  -post tPA MRI negative for bleeding, ASA started.  -LDL 137, Atorvastatin added.  -On comparison of today's exam to previous, patient is more attentive and comprehension is intact. Back to baseline mentally.   Impression:  -Likely seizure. Back to baseline mental status.  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  -TIA vs stroke like episode-less likely as MRI was negative for stroke and patient treated with tPA.  -? Diminishing of cognitive abilities per granddaughter.   Recommendations: -Continue Keppra 500mg  po q12 hours here and at home. -Outpatient f/up with Neurology in 2-4 weeks for seizure and neuropsychological  testing.   -Awaiting PT recommendations for discharge, but can be discharged from hospital per neuro. -Discussed with hospitalist.   Plan and safety precautions and below precautions discussed with granddaughter.   Per [facility] statutes, patients with seizures are not allowed to drive until they have been seizure-free for six months.   Use caution when using heavy equipment  or power tools. Avoid working on ladders or at heights. Take showers instead of baths. Ensure the water temperature is not too high on the home water heater. Do not go swimming alone. Do not lock yourself in a room alone (i.e. bathroom). When caring for infants or small children, sit down when holding, feeding, or changing them to minimize risk of injury to the child in the event you have a seizure. Maintain good sleep hygiene. Avoid alcohol.   If patient has another seizure, call 911 and bring them back to the ED if: A. The seizure lasts longer than 5 minutes.  B. The patient doesn't wake shortly after the seizure or has new problems such as difficulty seeing, speaking or moving following the seizure C. The patient was injured during the seizure D. The patient has a temperature over 102 F (39C) E. The patient vomited during the seizure and now is having trouble breathing.  Pt seen by Clance Boll, MSN, APN-BC/Nurse Practitioner/Neuro and later by MD. Note and plan to be edited as needed by MD.  Pager: [phone]

## 2021-02-01 NOTE — Progress Notes (Signed)
Occupational therapy Treatment Note  Pt making steady progress. Recommend direct S with mobility and ADL/IADL tasks initially after DC. Continue to recommend [place]. Will follow acutely.     02/01/21 1100  OT Visit Information  Last OT Received On 02/01/21  Assistance Needed +1  History of Present Illness Pt is an 87 y.o. female who presented 5/21 with AMS of unclear etiology. During hospital course pt became aphasic, thus Code Stroke was called. tPA was administered. EEG was suggestive of epileptogenicity in left frontal region as well as cortical dysfunction arising from left hemisphere, likely secondary to underlying structural abnormality, post-ictal state. MRI of head was negative for acute intracranial abnormalities. Pt refusing LP at this time. PMHx significant for HTN, thyroid disease, CAD, NSTEMI, Hx of skin cancer, L hip fx 07/2020 s/p pinning,and HLD.  Precautions  Precautions Fall  Pain Assessment  Pain Assessment Faces  Faces Pain Scale 6  Pain Location back  Pain Descriptors / Indicators Discomfort;Guarding  Pain Intervention(s) Limited activity within patient's tolerance;Patient requesting pain meds-RN notified  Cognition  Arousal/Alertness Awake/alert  Behavior During Therapy WFL for tasks assessed/performed  Overall Cognitive Status No family/caregiver present to determine baseline cognitive functioning  Safety/Judgement Decreased awareness of safety;Decreased awareness of deficits  Awareness Emergent  General Comments Able to recall events from previous PT session, however did not demosntrate safe use of Rollator  Upper Extremity Assessment  Upper Extremity Assessment Generalized weakness  Lower Extremity Assessment  Lower Extremity Assessment Defer to PT evaluation  ADL  Overall ADL's  Needs assistance/impaired  Functional mobility during ADLs Supervision/safety;Cueing for safety (rollator)  General ADL Comments Overall S for ADL tasks for safety  Bed Mobility   Overal bed mobility Modified Independent  Balance  Overall balance assessment Needs assistance  Sitting balance-Leahy Scale Good  Standing balance-Leahy Scale Fair  Transfers  Overall transfer level Needs assistance  Equipment used 4-wheeled walker  Transfers Sit to/from Stand  Sit to Stand Supervision  OT - End of Session  Activity Tolerance Patient tolerated treatment well (VSS)  Patient left in bed;with call bell/phone within reach;with bed alarm set  Nurse Communication Mobility status;Patient requests pain meds  OT Assessment/Plan  OT Plan Discharge plan remains appropriate  OT Visit Diagnosis Unsteadiness on feet (R26.81);Muscle weakness (generalized) (M62.81);History of falling (Z91.81);Other symptoms and signs involving cognitive function  OT Frequency (ACUTE ONLY) Min 3X/week  Follow Up Recommendations Home health OT;Supervision/Assistance - 24 hour (S wtih all mobility and ADL; Direct S wtih medication and financial management)  OT Equipment 3 in 1 bedside commode  AM-PAC OT "6 Clicks" Daily Activity Outcome Measure (Version 2)  Help from another person eating meals? 4  Help from another person taking care of personal grooming? 3  Help from another person toileting, which includes using toliet, bedpan, or urinal? 3  Help from another person bathing (including washing, rinsing, drying)? 3  Help from another person to put on and taking off regular upper body clothing? 3  Help from another person to put on and taking off regular lower body clothing? 3  6 Click Score 19  OT Goal Progression  Progress towards OT goals Progressing toward goals  Acute Rehab OT Goals  Patient Stated Goal to go home  OT Goal Formulation With patient  Time For Goal Achievement 02/13/21  Potential to Achieve Goals Good  ADL Goals  Pt Will Perform Grooming with modified independence;standing  Pt Will Perform Upper Body Dressing sitting;Independently  Pt Will Perform Lower Body Dressing with  modified independence;sit to/from stand  Pt Will Transfer to Toilet with modified independence;ambulating  Pt Will Perform Toileting - Clothing Manipulation and hygiene with modified independence;sit to/from stand  Pt Will Perform Licensed conveyancer transfer;3 in 1  OT Time Calculation  OT Start Time (ACUTE ONLY) 1056  OT Stop Time (ACUTE ONLY) 1123  OT Time Calculation (min) 27 min  OT General Charges  $OT Visit 1 Visit  OT Treatments  $Self Care/Home Management  23-37 mins  Maurie Boettcher, OT/L   Acute OT Clinical Specialist [place] Pager [phone] Office [phone]

## 2021-02-01 NOTE — Progress Notes (Signed)
Discharge instructions reviewed with pt's granddaughter Zacarias Pontes) by phone.

## 2021-02-01 NOTE — TOC Transition Note (Signed)
Transition of Care [facility]) - CM/SW Discharge Note   Patient Details  Name: [name] MRN: [id number] Date of Birth: 1934/04/16  [facility] CM/SW Contact:  Pollie Friar, RN Phone Number: 02/01/2021, 11:25 AM   Clinical Narrative:    Patient discharging home with outpatient therapy at [facility]. CM met with the patient and then called her granddaughter: Andrea Jackson with pt permission. Andrea Jackson states the patient will have 24 hour supervision at d/c. She also states the patient will have transportation to outpatient therapy and they prefer [facility] as they live in [place].  Pt will need the 3 in 1 recommended and [employer] will deliver it to the room. Pt has a rollator at home and PT says she will be good with continuing using it at home.  Pt has needed transportation home.  Final next level of care: OP Rehab Barriers to Discharge: No Barriers Identified   Patient Goals and CMS Choice   CMS Medicare.gov Compare Post Acute Care list provided to:: Patient Represenative (must comment) Choice offered to / list presented to : Adult Children (Granddaughter)  Discharge Placement                       Discharge Plan and Services                DME Arranged: 3-N-1                    Social Determinants of Health (SDOH) Interventions     Readmission Risk Interventions No flowsheet data found.

## 2021-02-01 NOTE — Progress Notes (Signed)
Discharge instructions (including medications) discussed with and copy provided to patient/caregiver 

## 2021-02-01 NOTE — Plan of Care (Signed)
  Problem: Education: Goal: Knowledge of General Education information will improve Description: Including pain rating scale, medication(s)/side effects and non-pharmacologic comfort measures Outcome: Adequate for Discharge   

## 2021-02-01 NOTE — Discharge Summary (Deleted)
Physician Discharge Summary  Andrea Jackson DOB: March 16, 1934 DOA: 01/29/2021  PCP: Caryl Bis, MD  Admit date: 01/29/2021 Discharge date: 02/01/2021  Admitted From: Home  Discharge disposition: Home    Recommendations for Outpatient Follow-Up:   . Follow up with your primary care provider in one week.  . Check CBC, BMP, magnesium in the next visit . Follow-up with neurology as outpatient.  Discharge Diagnosis:   Principal Problem:   AMS (altered mental status) Active Problems:   Hypothyroidism   Essential hypertension, benign   Seizures ([place])  Discharge Condition: Improved.  Diet recommendation: Low sodium, heart healthy.    Wound care: None.  Code status: DNR   History of Present Illness:   Andrea Jackson is a 87 y.o. female with medical history significant for hypertension, hypothyroidism, hyperlipidemia, history of GI bleed, history of aortic dissection and GERD who presented to the hospital with altered mental status.  Patient was reported by the family as being less responsive.  In the ED patient had no focal neurological findings except dysarthria.  She was transferred from [facility] to this hospital.  She also had a head CT and MRI of the brain which was discontinued prematurely since patient refused entire exam.  Initial lab work was unremarkable.  Patient was then admitted to hospital.    Hospital Course:   Following conditions were addressed during hospitalization as listed below,  Altered mental status likely metabolic encephalopathy. -Likely secondary to seizures.  TSH is normal.  MRI brain was reviewed by neurology which was nondiagnostic due to motion artifact.  Patient is on multiple medications at home I had a prolonged discussion with patient's daughter about some of these medications will discontinue Flexeril on discharge.  Patient's daughter was encouraged to discuss with primary care physician about polypharmacy.  Seizures.   Patient has been prescribed Keppra will be continued on discharge.  Concern for CVA during hospitalization.  Neurology administered tPA and was observed in the ICU.  MRI post tPA within normal limits.  Patient is allergic to statins could not be started   Essential HTN Slightly elevated.  Resume home medications.   Hypothyroidism -continue levothyroxine   Disposition.  At this time, patient is stable for disposition home with outpatient PCP and neurology follow-up.  Medical Consultants:    Neurology  Procedures:    None  Subjective:   Today, patient was seen and examined at bedside.  At baseline.  Denies any dizziness lightheadedness confusion  Discharge Exam:   Vitals:   02/01/21 0600 02/01/21 0731  BP: 137/80 (!) 167/93  Pulse:  72  Resp: 20 20  Temp:  98.1 F (36.7 C)  SpO2:  97%   Vitals:   02/01/21 0155 02/01/21 0445 02/01/21 0600 02/01/21 0731  BP: (!) 154/76 (!) 166/80 137/80 (!) 167/93  Pulse:  77  72  Resp:  (!) 22 20 20   Temp:  98.2 F (36.8 C)  98.1 F (36.7 C)  TempSrc:  Oral    SpO2:    97%  Weight:        General: Alert awake, not in obvious distress HENT: pupils equally reacting to light,  No scleral pallor or icterus noted. Oral mucosa is moist.  Chest:  Clear breath sounds.  Diminished breath sounds bilaterally. No crackles or wheezes.  CVS: S1 &S2 heard. No murmur.  Regular rate and rhythm. Abdomen: Soft, nontender, nondistended.  Bowel sounds are heard.   Extremities: No cyanosis, clubbing or edema.  Peripheral pulses are palpable. Psych: Alert, awake and oriented, normal mood CNS:  No cranial nerve deficits.  Power equal in all extremities.   Skin: Warm and dry.  No rashes noted.  The results of significant diagnostics from this hospitalization (including imaging, microbiology, ancillary and laboratory) are listed below for reference.     Diagnostic Studies:   EEG adult  Result Date: 01/30/2021 Lora Havens, MD     01/31/2021   6:56 AM Patient Name: PENNE BRAMAN MRN: [id number] Epilepsy Attending: Lora Havens Referring Physician/Provider: Dr Ileene Musa Date: 01/30/2021 Duration: 23.32 mins Patient history: 87yo F with ams. EEG to evaluate for seizure Level of alertness: Awake AEDs during EEG study: None Technical aspects: This EEG study was done with scalp electrodes positioned according to the 10-20 International system of electrode placement. Electrical activity was acquired at a sampling rate of 500Hz  and reviewed with a high frequency filter of 70Hz  and a low frequency filter of 1Hz . EEG data were recorded continuously and digitally stored. Description: The posterior dominant rhythm consists of 8 Hz activity of moderate voltage (25-35 uV) seen predominantly in posterior head regions, symmetric and reactive to eye opening and eye closing. EEG showed continuous 3 to 6 Hz theta-delta slowing in left hemisphere.. Hyperventilation and photic stimulation were not performed.   Of note, study was technically difficult due to significant movement artifact. ABNORMALITY - Continuous slow, left hemisphere IMPRESSION: This technically difficult study is suggestive of cortical dysfunction arising from left hemisphere, likely secondary to underlying structural abnormality, post-ictal state, HSV encephalitis. No seizures or epileptiform discharges were seen throughout the recording. Dr Rory Percy was notified. Lora Havens   CT HEAD CODE STROKE WO CONTRAST  Result Date: 01/30/2021 CLINICAL DATA:  Code stroke. Acute neuro deficit. Slurred speech and left-sided weakness EXAM: CT HEAD WITHOUT CONTRAST TECHNIQUE: Contiguous axial images were obtained from the base of the skull through the vertex without intravenous contrast. COMPARISON:  CT head 01/29/2021 FINDINGS: Brain: Mild frontal lobe atrophy. Ventricle size within normal limits. Moderate white matter changes with patchy hypodensity in the frontal white matter bilaterally unchanged from  yesterday. Negative for acute infarct, acute hemorrhage, mass. Vascular: Negative for hyperdense vessel. Atherosclerotic calcification in the carotid and vertebral arteries bilaterally. Skull: Negative Sinuses/Orbits: Paranasal sinuses clear. Bilateral cataract extraction Other: None ASPECTS ([place] Stroke Program Early CT Score) - Ganglionic level infarction (caudate, lentiform nuclei, internal capsule, insula, M1-M3 cortex): 7 - Supraganglionic infarction (M4-M6 cortex): 3 Total score (0-10 with 10 being normal): 10 IMPRESSION: 1. No acute abnormality. 2. Frontal lobe atrophy. Patchy white matter hypodensity compatible with chronic microvascular ischemia. 3. ASPECTS is 10 4. Code stroke imaging results were communicated on 01/30/2021 at 12:01 pm to provider Rory Percy via text page Electronically Signed   By: Franchot Gallo M.D.   On: 01/30/2021 12:03   CT ANGIO HEAD NECK W WO CM (CODE STROKE)  Result Date: 01/30/2021 CLINICAL DATA:  Acute neuro deficit. Slurred speech and left-sided weakness. EXAM: CT ANGIOGRAPHY HEAD AND NECK TECHNIQUE: Multidetector CT imaging of the head and neck was performed using the standard protocol during bolus administration of intravenous contrast. Multiplanar CT image reconstructions and MIPs were obtained to evaluate the vascular anatomy. Carotid stenosis measurements (when applicable) are obtained utilizing NASCET criteria, using the distal internal carotid diameter as the denominator. CONTRAST:  12mL OMNIPAQUE IOHEXOL 350 MG/ML SOLN COMPARISON:  CT head 01/30/2021 FINDINGS: CTA NECK FINDINGS Aortic arch: Standard branching. Imaged portion shows no evidence of aneurysm or  dissection. No significant stenosis of the major arch vessel origins. Mild atherosclerotic disease aortic arch and proximal left subclavian artery Right carotid system: Right carotid widely patent without significant stenosis. Minimal atherosclerotic disease at the bifurcation Left carotid system: Left carotid  widely patent without stenosis. Mild atherosclerotic calcification at the bifurcation. Vertebral arteries: Both vertebral arteries patent to the skull base without stenosis. Skeleton: Cervical spondylosis.  No acute abnormality. Other neck: Negative for mass or edema in the neck. Upper chest: Lung apices clear bilaterally. Review of the MIP images confirms the above findings CTA HEAD FINDINGS Anterior circulation: Mild atherosclerotic calcification in the cavernous carotid bilaterally without stenosis. 2 mm aneurysm left posterior communicating artery region. Fetal origin right posterior communicating artery. Anterior and middle cerebral arteries patent bilaterally with the without stenosis or large vessel occlusion Posterior circulation: Atherosclerotic calcification in the distal vertebral artery bilaterally with mild stenosis on the right and moderate stenosis on the left. PICA patent bilaterally. Basilar widely patent. AICA, superior cerebellar, and posterior cerebral arteries patent bilaterally without stenosis. Fetal origin right posterior cerebral artery. Venous sinuses: Normal venous enhancement Anatomic variants: None Review of the MIP images confirms the above findings IMPRESSION: 1. Negative for intracranial large vessel occlusion or flow limiting stenosis. 2. 2 mm aneurysm left posterior communicating artery region. 3. Mild atherosclerotic disease in the carotid bifurcation bilaterally. No significant carotid stenosis 4. Mild stenosis distal right vertebral artery and moderate stenosis distal left vertebral artery. Electronically Signed   By: Franchot Gallo M.D.   On: 01/30/2021 13:48     Labs:   Basic Metabolic Panel: Recent Labs  Lab 01/30/21 1638 01/31/21 0304 02/01/21 0604  NA  --  138 138  K  --  3.5 3.6  CL  --  103 106  CO2  --  26 27  GLUCOSE  --  98 100*  BUN  --  21 13  CREATININE 0.95 0.89 0.73  CALCIUM  --  8.7* 8.8*   GFR Estimated Creatinine Clearance: 43.3 mL/min (by  C-G formula based on SCr of 0.73 mg/dL). Liver Function Tests: No results for input(s): AST, ALT, ALKPHOS, BILITOT, PROT, ALBUMIN in the last 168 hours. No results for input(s): LIPASE, AMYLASE in the last 168 hours. Recent Labs  Lab 01/30/21 0159  AMMONIA 22   Coagulation profile No results for input(s): INR, PROTIME in the last 168 hours.  CBC: No results for input(s): WBC, NEUTROABS, HGB, HCT, MCV, PLT in the last 168 hours. Cardiac Enzymes: No results for input(s): CKTOTAL, CKMB, CKMBINDEX, TROPONINI in the last 168 hours. BNP: Invalid input(s): POCBNP CBG: Recent Labs  Lab 01/30/21 1144  GLUCAP 108*   D-Dimer No results for input(s): DDIMER in the last 72 hours. Hgb A1c Recent Labs    01/30/21 0159  HGBA1C 5.9*   Lipid Profile Recent Labs    01/30/21 0159  CHOL 220*  HDL 62  LDLCALC 137*  TRIG 103  CHOLHDL 3.5   Thyroid function studies No results for input(s): TSH, T4TOTAL, T3FREE, THYROIDAB in the last 72 hours.  Invalid input(s): FREET3 Anemia work up No results for input(s): VITAMINB12, FOLATE, FERRITIN, TIBC, IRON, RETICCTPCT in the last 72 hours. Microbiology Recent Results (from the past 240 hour(s))  SARS CORONAVIRUS 2 (TAT 6-24 HRS) Nasopharyngeal Nasopharyngeal Swab     Status: None   Collection Time: 01/30/21  1:51 AM   Specimen: Nasopharyngeal Swab  Result Value Ref Range Status   SARS Coronavirus 2 NEGATIVE NEGATIVE Final  Comment: (NOTE) SARS-CoV-2 target nucleic acids are NOT DETECTED.  The SARS-CoV-2 RNA is generally detectable in upper and lower respiratory specimens during the acute phase of infection. Negative results do not preclude SARS-CoV-2 infection, do not rule out co-infections with other pathogens, and should not be used as the sole basis for treatment or other patient management decisions. Negative results must be combined with clinical observations, patient history, and epidemiological information. The  expected result is Negative.  Fact Sheet for Patients: HairSlick.no  Fact Sheet for Healthcare Providers: quierodirigir.com  This test is not yet approved or cleared by the [place] FDA and  has been authorized for detection and/or diagnosis of SARS-CoV-2 by FDA under an Emergency Use Authorization (EUA). This EUA will remain  in effect (meaning this test can be used) for the duration of the COVID-19 declaration under Se ction 564(b)(1) of the Act, 21 U.S.C. section 360bbb-3(b)(1), unless the authorization is terminated or revoked sooner.  Performed at [facility], [address], [place], [place] [address]      Discharge Instructions:   Discharge Instructions     Ambulatory referral to Neurology   Complete by: As directed    An appointment is requested in approximately: 4 weeks   Ambulatory referral to Occupational Therapy   Complete by: As directed    Ambulatory referral to Physical Therapy   Complete by: As directed    Diet - low sodium heart healthy   Complete by: As directed    Discharge instructions   Complete by: As directed    Follow up with your primary care provider in one week. Do not drive until seen by your neurologist as outpatient.   Increase activity slowly   Complete by: As directed       Allergies as of 02/01/2021       Reactions   Demerol [meperidine] Swelling   Patient stated she had a "reaction" to Demerol. Swollen lip.   Celecoxib Other (See Comments)   REACTION: hyper, couldn't eat or sleep   Codeine Itching   Oxycodone-acetaminophen Itching   Statins Other (See Comments)   Muscle aches, can not tolerate any of them per patient.          Medication List     STOP taking these medications    cyclobenzaprine 10 MG tablet Commonly known as: FLEXERIL       TAKE these medications    acetaminophen 500 MG tablet Commonly known as: TYLENOL Take 1 tablet (500 mg  total) by mouth every 6 (six) hours.   ALPRAZolam 0.5 MG tablet Commonly known as: XANAX Take 1 tablet (0.5 mg total) by mouth 2 (two) times daily as needed for anxiety or sleep. What changed: how much to take   amLODipine 5 MG tablet Commonly known as: NORVASC Take 1 tablet (5 mg total) by mouth daily.   bisacodyl 5 MG EC tablet Commonly known as: DULCOLAX Take 5 mg by mouth daily as needed for moderate constipation.   Cyanocobalamin 2500 MCG Tabs Take 2,500 mcg by mouth daily with breakfast.   diclofenac 75 MG EC tablet Commonly known as: VOLTAREN Take 75 mg by mouth daily with breakfast. Takes 1-2 times per day   diclofenac sodium 1 % Gel Commonly known as: VOLTAREN Apply 1 application topically 2 (two) times daily as needed (joint pain).   enoxaparin 40 MG/0.4ML injection Commonly known as: LOVENOX Inject 0.4 mLs (40 mg total) into the skin daily.   ergocalciferol 1.25 MG (50000 UT)  capsule Commonly known as: VITAMIN D2 Take 1 capsule (50,000 Units total) by mouth once a week.   esomeprazole 40 MG capsule Commonly known as: NEXIUM Take 40 mg by mouth 2 (two) times daily as needed (acid reflux/heartburn).   ferrous sulfate 325 (65 FE) MG tablet Take 1 tablet (325 mg total) by mouth 3 (three) times daily after meals. What changed: when to take this   Fish Oil 1200 MG Caps Take 1,200 mg by mouth 2 (two) times daily.   hydrochlorothiazide 12.5 MG capsule Commonly known as: MICROZIDE Take 12.5 mg by mouth daily with breakfast.   isosorbide mononitrate 30 MG 24 hr tablet Commonly known as: IMDUR TAKE 1/2 TABLET BY MOUTH DAILY What changed: how much to take   levETIRAcetam 500 MG tablet Commonly known as: KEPPRA Take 1 tablet (500 mg total) by mouth 2 (two) times daily.   levothyroxine 150 MCG tablet Commonly known as: SYNTHROID Take 150 mcg by mouth daily before breakfast.   loratadine 10 MG tablet Commonly known as: CLARITIN Take 10 mg by mouth daily as  needed (seasonal allergies).   losartan 100 MG tablet Commonly known as: COZAAR Take 100 mg by mouth daily.   loteprednol 0.5 % ophthalmic suspension Commonly known as: LOTEMAX Place 2 drops into both eyes daily.   meclizine 12.5 MG tablet Commonly known as: ANTIVERT Take 1 tablet (12.5 mg total) by mouth 3 (three) times daily as needed for dizziness.   metoprolol succinate 25 MG 24 hr tablet Commonly known as: TOPROL-XL Take 0.5 tablets (12.5 mg total) by mouth daily. What changed: when to take this   nitroGLYCERIN 0.4 MG SL tablet Commonly known as: NITROSTAT Place 0.4 mg under the tongue every 5 (five) minutes x 3 doses as needed for chest pain.   polyethylene glycol 17 g packet Commonly known as: MIRALAX / GLYCOLAX Take 17 g by mouth daily as needed for mild constipation.   promethazine 25 MG tablet Commonly known as: PHENERGAN Take 12.5-25 mg by mouth every 6 (six) hours as needed for nausea or vomiting.   Restasis 0.05 % ophthalmic emulsion Generic drug: cycloSPORINE Place 1 drop into both eyes 2 (two) times daily as needed (dry eyes).   traMADol 50 MG tablet Commonly known as: ULTRAM Take 1 tablet (50 mg total) by mouth every 6 (six) hours as needed for moderate pain or severe pain.   Vitamin D-3 125 MCG (5000 UT) Tabs Take 5,000 Units by mouth daily with breakfast.               Durable Medical Equipment  (From admission, onward)           Start     Ordered   02/01/21 1057  For home use only DME 3 n 1  Once        02/01/21 1056            Follow-up Information     Caryl Bis, MD. Schedule an appointment as soon as possible for a visit in 1 week(s).   Specialty: Family Medicine Why: regular followup Contact information: [place] [address] [phone]         Satira Sark, MD .   Specialty: Cardiology Contact information: [place] [address] [phone]         [facility] Follow up.   Specialty: Rehabilitation Why: The outpatient therapy will contact you for the first appointment Contact  information: [place] [phone] [place] [phone]                 Time coordinating discharge: 39 minutes  Signed:  Trinaty Bundrick  [facility] 02/01/2021, 1:21 PM

## 2021-02-01 NOTE — Progress Notes (Signed)
Physical Therapy Treatment Patient Details Name: Andrea Jackson MRN: [id number] DOB: [birth year] Today's Date: 02/01/2021    History of Present Illness Pt is an 87 y.o. female who presented 5/21 with AMS of unclear etiology. During hospital course pt became aphasic, thus Code Stroke was called. tPA was administered. EEG was suggestive of epileptogenicity in left frontal region as well as cortical dysfunction arising from left hemisphere, likely secondary to underlying structural abnormality, post-ictal state. MRI of head was negative for acute intracranial abnormalities. Pt refusing LP at this time. PMHx significant for HTN, thyroid disease, CAD, NSTEMI, Hx of skin cancer, L hip fx 07/2020 s/p pinning,and HLD.    PT Comments    Pt resting in bed upon arrival to room, expresses that she may d/c today. Pt ambulatory in hallway with rollator with close guard for safety, requires some cues for navigating hallway and rollator use. Pt incontinent of urine in hallway, and pt's response was to laugh and state that was not normal for her. Pt demonstrates some safety awareness and attentional deficits during session, and PT feels pt also lacks insight into deficits stating she would mobilize by herself at home once d/c from acute setting. PT reiterated pt will need someone with her during mobility (pt's great grandson during the day as he works nights, daughter lives closeby), and pt expresses understanding. Will continue to follow.     Follow Up Recommendations  Outpatient PT;Supervision for mobility/OOB (neuro clinic)     Equipment Recommendations  Rolling walker with 5" wheels (if unable to manage rollator safely)    Recommendations for Other Services       Precautions / Restrictions Precautions Precautions: Fall Restrictions Weight Bearing Restrictions: No    Mobility  Bed Mobility Overal bed mobility: Needs Assistance Bed Mobility: Supine to Sit;Sit to Supine     Supine to sit:  Supervision Sit to supine: Supervision   General bed mobility comments: supervison for safety, no cuing needed and performed from [facility] flat without railing.    Transfers Overall transfer level: Needs assistance Equipment used: 4-wheeled walker Transfers: Sit to/from Stand Sit to Stand: Min guard         General transfer comment: Min guard for safety, verbal cuing for hand placement when rising/standing. STS x3, from bed, toilet x2.  Ambulation/Gait Ambulation/Gait assistance: Min guard Gait Distance (Feet): 150 Feet (+100) Assistive device: 4-wheeled walker Gait Pattern/deviations: Step-through pattern;Trunk flexed;Narrow base of support;Decreased stride length Gait velocity: decr   General Gait Details: Min guard for safety, intermittently bumping into objects during mobility with rollator but able to course-correct without PT intervention, suspect due to decreased safety awarenes. Pt incontinent of urine on first bout of gait, requiring return to room   Stairs Stairs: Yes Stairs assistance: Min guard Stair Management: No rails;Step to pattern;Forwards Number of Stairs: 2 General stair comments: min guard for safety, difficulty not holding onto railings so PT provided HHA which caregiver can provide.   Wheelchair Mobility    Modified Rankin (Stroke Patients Only) Modified Rankin (Stroke Patients Only) Pre-Morbid Rankin Score: No symptoms Modified Rankin: Moderately severe disability     Balance Overall balance assessment: Needs assistance Sitting-balance support: No upper extremity supported;Feet supported Sitting balance-Leahy Scale: Good Sitting balance - Comments: adjusts bilat socks at EOB without external assist or LOB   Standing balance support: Single extremity supported;No upper extremity supported;During functional activity Standing balance-Leahy Scale: Fair Standing balance comment: reliant on external support dynamically  Cognition Arousal/Alertness: Awake/alert Behavior During Therapy: WFL for tasks assessed/performed Overall Cognitive Status: Impaired/Different from baseline Area of Impairment: Attention;Memory;Awareness;Following commands;Safety/judgement;Problem solving                   Current Attention Level: Sustained Memory: Decreased short-term memory Following Commands: Follows one step commands with increased time Safety/Judgement: Decreased awareness of safety;Decreased awareness of deficits Awareness: Emergent Problem Solving: Slow processing;Difficulty sequencing;Requires verbal cues;Requires tactile cues General Comments: Pt demonstrates difficulty with alternating attention, correctly following multistep instructions. Pt with urinary incontinence in hallway, per pt this is not baseline, and pt's response to this was laughing. Poor insight into deficits and need for help at d/c. Little to no recall of how to safely lock rollator when moving sit<>stand.      Exercises      General Comments        Pertinent Vitals/Pain Pain Assessment: Faces Faces Pain Scale: Hurts a little bit Pain Location: L hip, hip fx and pinning in 07/2020 Pain Descriptors / Indicators: Discomfort;Guarding Pain Intervention(s): Limited activity within patient's tolerance;Monitored during session;Repositioned    Home Living                      Prior Function            PT Goals (current goals can now be found in the care plan section) Acute Rehab PT Goals Patient Stated Goal: to improve PT Goal Formulation: With patient Time For Goal Achievement: 02/14/21 Potential to Achieve Goals: Good Progress towards PT goals: Progressing toward goals    Frequency    Min 3X/week      PT Plan Current plan remains appropriate    Co-evaluation              AM-PAC PT "6 Clicks" Mobility   Outcome Measure  Help needed turning from your back to your side while in a flat bed  without using bedrails?: A Little Help needed moving from lying on your back to sitting on the side of a flat bed without using bedrails?: A Little Help needed moving to and from a bed to a chair (including a wheelchair)?: A Little Help needed standing up from a chair using your arms (e.g., wheelchair or bedside chair)?: A Little Help needed to walk in hospital room?: A Little Help needed climbing 3-5 steps with a railing? : A Little 6 Click Score: 18    End of Session   Activity Tolerance: Patient tolerated treatment well Patient left: with call bell/phone within reach;in bed;with bed alarm set Nurse Communication: Mobility status;Other (comment) (BP, pain in hip) PT Visit Diagnosis: Unsteadiness on feet (R26.81);Other abnormalities of gait and mobility (R26.89);Muscle weakness (generalized) (M62.81);Difficulty in walking, not elsewhere classified (R26.2);Other symptoms and signs involving the nervous system (R29.898)     Time: 3382-5053 PT Time Calculation (min) (ACUTE ONLY): 26 min  Charges:  $Gait Training: 8-22 mins $Therapeutic Activity: 8-22 mins                     Stacie Glaze, PT DPT Acute Rehabilitation Services Pager [phone]  Office [phone]    [place] 02/01/2021, 10:54 AM

## 2021-02-02 NOTE — Discharge Summary (Addendum)
Physician Discharge Summary  [name] BOF:[id number] DOB: 02-Nov-1933 DOA: 01/29/2021  PCP: Caryl Bis, MD  Admit date: 01/29/2021 Discharge date: 02/01/2021  Admitted From: Home  Discharge disposition: Home    Recommendations for Outpatient Follow-Up:   . Follow up with your primary care provider in one week.  . Check CBC, BMP, magnesium in the next visit . Follow-up with neurology as outpatient.  Discharge Diagnosis:   Principal Problem:   AMS (altered mental status) Active Problems:   Hypothyroidism   Essential hypertension, benign   Seizures ([place])  Discharge Condition: Improved.  Diet recommendation: Low sodium, heart healthy.    Wound care: None.  Code status: DNR   History of Present Illness:   Andrea Jackson is a 87 y.o. female with medical history significant for hypertension, hypothyroidism, hyperlipidemia, history of GI bleed, history of aortic dissection and GERD who presented to the hospital with altered mental status.  Patient was reported by the family as being less responsive.  In the ED patient had no focal neurological findings except dysarthria.  She was transferred from [facility] to this hospital.  She also had a head CT and MRI of the brain which was discontinued prematurely since patient refused entire exam.  Initial lab work was unremarkable.  Patient was then admitted to hospital.    Hospital Course:   Following conditions were addressed during hospitalization as listed below,  Acute encephalopathy due to seizures and post ictal state.  TSH is normal.  MRI brain was reviewed by neurology which was nondiagnostic due to motion artifact.  Patient is on multiple medications at home. I had a prolonged discussion with patient's daughter about some of these medications will discontinue Flexeril on discharge.  Patient's daughter was encouraged to discuss with primary care physician about polypharmacy.  Seizures.  Patient has been prescribed  Keppra. This will be continued on discharge.  Concern for CVA during hospitalization.  Neurology administered tPA and was observed in the ICU.  MRI post tPA within normal limits.  Patient is allergic to statins could not be started   Essential HTN Slightly elevated.  Resume home medications.   Hypothyroidism -continue levothyroxine   Disposition.  At this time, patient is stable for disposition home with outpatient PCP and neurology follow-up.  Medical Consultants:    Neurology  Procedures:    None  Subjective:   Today, patient was seen and examined at bedside.  At baseline.  Denies any dizziness, lightheadedness, confusion  Discharge Exam:   Vitals:   02/01/21 0600 02/01/21 0731  BP: 137/80 (!) 167/93  Pulse:  72  Resp: 20 20  Temp:  98.1 F (36.7 C)  SpO2:  97%   Vitals:   02/01/21 0155 02/01/21 0445 02/01/21 0600 02/01/21 0731  BP: (!) 154/76 (!) 166/80 137/80 (!) 167/93  Pulse:  77  72  Resp:  (!) 22 20 20   Temp:  98.2 F (36.8 C)  98.1 F (36.7 C)  TempSrc:  Oral    SpO2:    97%  Weight:       General: Alert awake, not in obvious distress HENT: pupils equally reacting to light,  No scleral pallor or icterus noted. Oral mucosa is moist.  Chest:  Clear breath sounds.  Diminished breath sounds bilaterally. No crackles or wheezes.  CVS: S1 &S2 heard. No murmur.  Regular rate and rhythm. Abdomen: Soft, nontender, nondistended.  Bowel sounds are heard.   Extremities: No cyanosis, clubbing or edema.  Peripheral  pulses are palpable. Psych: Alert, awake and oriented, normal mood CNS:  No cranial nerve deficits.  Power equal in all extremities.   Skin: Warm and dry.  No rashes noted.  The results of significant diagnostics from this hospitalization (including imaging, microbiology, ancillary and laboratory) are listed below for reference.     Diagnostic Studies:   EEG adult  Result Date: 01/30/2021 Lora Havens, MD     01/31/2021  6:56 AM Patient Name:  DEAIRA Jackson MRN: [id number] Epilepsy Attending: Lora Havens Referring Physician/Provider: Dr Ileene Musa Date: 01/30/2021 Duration: 23.32 mins Patient history: 87yo F with ams. EEG to evaluate for seizure Level of alertness: Awake AEDs during EEG study: None Technical aspects: This EEG study was done with scalp electrodes positioned according to the 10-20 International system of electrode placement. Electrical activity was acquired at a sampling rate of 500Hz  and reviewed with a high frequency filter of 70Hz  and a low frequency filter of 1Hz . EEG data were recorded continuously and digitally stored. Description: The posterior dominant rhythm consists of 8 Hz activity of moderate voltage (25-35 uV) seen predominantly in posterior head regions, symmetric and reactive to eye opening and eye closing. EEG showed continuous 3 to 6 Hz theta-delta slowing in left hemisphere.. Hyperventilation and photic stimulation were not performed.   Of note, study was technically difficult due to significant movement artifact. ABNORMALITY - Continuous slow, left hemisphere IMPRESSION: This technically difficult study is suggestive of cortical dysfunction arising from left hemisphere, likely secondary to underlying structural abnormality, post-ictal state, HSV encephalitis. No seizures or epileptiform discharges were seen throughout the recording. Dr Rory Percy was notified. Lora Havens   CT HEAD CODE STROKE WO CONTRAST  Result Date: 01/30/2021 CLINICAL DATA:  Code stroke. Acute neuro deficit. Slurred speech and left-sided weakness EXAM: CT HEAD WITHOUT CONTRAST TECHNIQUE: Contiguous axial images were obtained from the base of the skull through the vertex without intravenous contrast. COMPARISON:  CT head 01/29/2021 FINDINGS: Brain: Mild frontal lobe atrophy. Ventricle size within normal limits. Moderate white matter changes with patchy hypodensity in the frontal white matter bilaterally unchanged from yesterday. Negative for acute  infarct, acute hemorrhage, mass. Vascular: Negative for hyperdense vessel. Atherosclerotic calcification in the carotid and vertebral arteries bilaterally. Skull: Negative Sinuses/Orbits: Paranasal sinuses clear. Bilateral cataract extraction Other: None ASPECTS ([place] Stroke Program Early CT Score) - Ganglionic level infarction (caudate, lentiform nuclei, internal capsule, insula, M1-M3 cortex): 7 - Supraganglionic infarction (M4-M6 cortex): 3 Total score (0-10 with 10 being normal): 10 IMPRESSION: 1. No acute abnormality. 2. Frontal lobe atrophy. Patchy white matter hypodensity compatible with chronic microvascular ischemia. 3. ASPECTS is 10 4. Code stroke imaging results were communicated on 01/30/2021 at 12:01 pm to provider Rory Percy via text page Electronically Signed   By: Franchot Gallo M.D.   On: 01/30/2021 12:03   CT ANGIO HEAD NECK W WO CM (CODE STROKE)  Result Date: 01/30/2021 CLINICAL DATA:  Acute neuro deficit. Slurred speech and left-sided weakness. EXAM: CT ANGIOGRAPHY HEAD AND NECK TECHNIQUE: Multidetector CT imaging of the head and neck was performed using the standard protocol during bolus administration of intravenous contrast. Multiplanar CT image reconstructions and MIPs were obtained to evaluate the vascular anatomy. Carotid stenosis measurements (when applicable) are obtained utilizing NASCET criteria, using the distal internal carotid diameter as the denominator. CONTRAST:  41mL OMNIPAQUE IOHEXOL 350 MG/ML SOLN COMPARISON:  CT head 01/30/2021 FINDINGS: CTA NECK FINDINGS Aortic arch: Standard branching. Imaged portion shows no evidence of aneurysm or dissection.  No significant stenosis of the major arch vessel origins. Mild atherosclerotic disease aortic arch and proximal left subclavian artery Right carotid system: Right carotid widely patent without significant stenosis. Minimal atherosclerotic disease at the bifurcation Left carotid system: Left carotid widely patent without stenosis.  Mild atherosclerotic calcification at the bifurcation. Vertebral arteries: Both vertebral arteries patent to the skull base without stenosis. Skeleton: Cervical spondylosis.  No acute abnormality. Other neck: Negative for mass or edema in the neck. Upper chest: Lung apices clear bilaterally. Review of the MIP images confirms the above findings CTA HEAD FINDINGS Anterior circulation: Mild atherosclerotic calcification in the cavernous carotid bilaterally without stenosis. 2 mm aneurysm left posterior communicating artery region. Fetal origin right posterior communicating artery. Anterior and middle cerebral arteries patent bilaterally with the without stenosis or large vessel occlusion Posterior circulation: Atherosclerotic calcification in the distal vertebral artery bilaterally with mild stenosis on the right and moderate stenosis on the left. PICA patent bilaterally. Basilar widely patent. AICA, superior cerebellar, and posterior cerebral arteries patent bilaterally without stenosis. Fetal origin right posterior cerebral artery. Venous sinuses: Normal venous enhancement Anatomic variants: None Review of the MIP images confirms the above findings IMPRESSION: 1. Negative for intracranial large vessel occlusion or flow limiting stenosis. 2. 2 mm aneurysm left posterior communicating artery region. 3. Mild atherosclerotic disease in the carotid bifurcation bilaterally. No significant carotid stenosis 4. Mild stenosis distal right vertebral artery and moderate stenosis distal left vertebral artery. Electronically Signed   By: Franchot Gallo M.D.   On: 01/30/2021 13:48     Labs:   Basic Metabolic Panel: Recent Labs  Lab 01/30/21 1638 01/31/21 0304 02/01/21 0604  NA  --  138 138  K  --  3.5 3.6  CL  --  103 106  CO2  --  26 27  GLUCOSE  --  98 100*  BUN  --  21 13  CREATININE 0.95 0.89 0.73  CALCIUM  --  8.7* 8.8*   GFR Estimated Creatinine Clearance: 43.3 mL/min (by C-G formula based on SCr of 0.73  mg/dL). Liver Function Tests: No results for input(s): AST, ALT, ALKPHOS, BILITOT, PROT, ALBUMIN in the last 168 hours. No results for input(s): LIPASE, AMYLASE in the last 168 hours. Recent Labs  Lab 01/30/21 0159  AMMONIA 22   Coagulation profile No results for input(s): INR, PROTIME in the last 168 hours.  CBC: No results for input(s): WBC, NEUTROABS, HGB, HCT, MCV, PLT in the last 168 hours. Cardiac Enzymes: No results for input(s): CKTOTAL, CKMB, CKMBINDEX, TROPONINI in the last 168 hours. BNP: Invalid input(s): POCBNP CBG: Recent Labs  Lab 01/30/21 1144  GLUCAP 108*   D-Dimer No results for input(s): DDIMER in the last 72 hours. Hgb A1c No results for input(s): HGBA1C in the last 72 hours. Lipid Profile No results for input(s): CHOL, HDL, LDLCALC, TRIG, CHOLHDL, LDLDIRECT in the last 72 hours. Thyroid function studies No results for input(s): TSH, T4TOTAL, T3FREE, THYROIDAB in the last 72 hours.  Invalid input(s): FREET3 Anemia work up No results for input(s): VITAMINB12, FOLATE, FERRITIN, TIBC, IRON, RETICCTPCT in the last 72 hours. Microbiology Recent Results (from the past 240 hour(s))  SARS CORONAVIRUS 2 (TAT 6-24 HRS) Nasopharyngeal Nasopharyngeal Swab     Status: None   Collection Time: 01/30/21  1:51 AM   Specimen: Nasopharyngeal Swab  Result Value Ref Range Status   SARS Coronavirus 2 NEGATIVE NEGATIVE Final    Comment: (NOTE) SARS-CoV-2 target nucleic acids are NOT DETECTED.  The SARS-CoV-2  RNA is generally detectable in upper and lower respiratory specimens during the acute phase of infection. Negative results do not preclude SARS-CoV-2 infection, do not rule out co-infections with other pathogens, and should not be used as the sole basis for treatment or other patient management decisions. Negative results must be combined with clinical observations, patient history, and epidemiological information. The expected result is Negative.  Fact Sheet  for Patients: SugarRoll.be  Fact Sheet for Healthcare Providers: https://www.woods-mathews.com/  This test is not yet approved or cleared by the [place] FDA and  has been authorized for detection and/or diagnosis of SARS-CoV-2 by FDA under an Emergency Use Authorization (EUA). This EUA will remain  in effect (meaning this test can be used) for the duration of the COVID-19 declaration under Se ction 564(b)(1) of the Act, 21 U.S.C. section 360bbb-3(b)(1), unless the authorization is terminated or revoked sooner.  Performed at [facility], [place] [address] [place], [place] [address]      Discharge Instructions:   Discharge Instructions    Ambulatory referral to Neurology   Complete by: As directed    An appointment is requested in approximately: 4 weeks   Ambulatory referral to Occupational Therapy   Complete by: As directed    Ambulatory referral to Physical Therapy   Complete by: As directed    Diet - low sodium heart healthy   Complete by: As directed    Discharge instructions   Complete by: As directed    Follow up with your primary care provider in one week. Do not drive until seen by your neurologist as outpatient.   Increase activity slowly   Complete by: As directed      Allergies as of 02/01/2021      Reactions   Demerol [meperidine] Swelling   Patient stated she had a "reaction" to Demerol. Swollen lip.   Celecoxib Other (See Comments)   REACTION: hyper, couldn't eat or sleep   Codeine Itching   Oxycodone-acetaminophen Itching   Statins Other (See Comments)   Muscle aches, can not tolerate any of them per patient.        Medication List    STOP taking these medications   cyclobenzaprine 10 MG tablet Commonly known as: FLEXERIL     TAKE these medications   acetaminophen 500 MG tablet Commonly known as: TYLENOL Take 1 tablet (500 mg total) by mouth every 6 (six) hours.   ALPRAZolam 0.5 MG  tablet Commonly known as: XANAX Take 1 tablet (0.5 mg total) by mouth 2 (two) times daily as needed for anxiety or sleep. What changed: how much to take   amLODipine 5 MG tablet Commonly known as: NORVASC Take 1 tablet (5 mg total) by mouth daily.   bisacodyl 5 MG EC tablet Commonly known as: DULCOLAX Take 5 mg by mouth daily as needed for moderate constipation.   Cyanocobalamin 2500 MCG Tabs Take 2,500 mcg by mouth daily with breakfast.   diclofenac 75 MG EC tablet Commonly known as: VOLTAREN Take 75 mg by mouth daily with breakfast. Takes 1-2 times per day   diclofenac sodium 1 % Gel Commonly known as: VOLTAREN Apply 1 application topically 2 (two) times daily as needed (joint pain).   enoxaparin 40 MG/0.4ML injection Commonly known as: LOVENOX Inject 0.4 mLs (40 mg total) into the skin daily.   ergocalciferol 1.25 MG (50000 UT) capsule Commonly known as: VITAMIN D2 Take 1 capsule (50,000 Units total) by mouth once a week.   esomeprazole 40 MG  capsule Commonly known as: NEXIUM Take 40 mg by mouth 2 (two) times daily as needed (acid reflux/heartburn).   ferrous sulfate 325 (65 FE) MG tablet Take 1 tablet (325 mg total) by mouth 3 (three) times daily after meals. What changed: when to take this   Fish Oil 1200 MG Caps Take 1,200 mg by mouth 2 (two) times daily.   hydrochlorothiazide 12.5 MG capsule Commonly known as: MICROZIDE Take 12.5 mg by mouth daily with breakfast.   isosorbide mononitrate 30 MG 24 hr tablet Commonly known as: IMDUR TAKE 1/2 TABLET BY MOUTH DAILY What changed: how much to take   levETIRAcetam 500 MG tablet Commonly known as: KEPPRA Take 1 tablet (500 mg total) by mouth 2 (two) times daily.   levothyroxine 150 MCG tablet Commonly known as: SYNTHROID Take 150 mcg by mouth daily before breakfast.   loratadine 10 MG tablet Commonly known as: CLARITIN Take 10 mg by mouth daily as needed (seasonal allergies).   losartan 100 MG  tablet Commonly known as: COZAAR Take 100 mg by mouth daily.   loteprednol 0.5 % ophthalmic suspension Commonly known as: LOTEMAX Place 2 drops into both eyes daily.   meclizine 12.5 MG tablet Commonly known as: ANTIVERT Take 1 tablet (12.5 mg total) by mouth 3 (three) times daily as needed for dizziness.   metoprolol succinate 25 MG 24 hr tablet Commonly known as: TOPROL-XL Take 0.5 tablets (12.5 mg total) by mouth daily. What changed: when to take this   nitroGLYCERIN 0.4 MG SL tablet Commonly known as: NITROSTAT Place 0.4 mg under the tongue every 5 (five) minutes x 3 doses as needed for chest pain.   polyethylene glycol 17 g packet Commonly known as: MIRALAX / GLYCOLAX Take 17 g by mouth daily as needed for mild constipation.   promethazine 25 MG tablet Commonly known as: PHENERGAN Take 12.5-25 mg by mouth every 6 (six) hours as needed for nausea or vomiting.   Restasis 0.05 % ophthalmic emulsion Generic drug: cycloSPORINE Place 1 drop into both eyes 2 (two) times daily as needed (dry eyes).   traMADol 50 MG tablet Commonly known as: ULTRAM Take 1 tablet (50 mg total) by mouth every 6 (six) hours as needed for moderate pain or severe pain.   Vitamin D-3 125 MCG (5000 UT) Tabs Take 5,000 Units by mouth daily with breakfast.       Follow-up Information    Caryl Bis, MD. Schedule an appointment as soon as possible for a visit in 1 week(s).   Specialty: Family Medicine Why: regular followup Contact information: [place] [address] [phone]        Satira Sark, MD .   Specialty: Cardiology Contact information: [place] [address] [phone]        [facility] Follow up.   Specialty: Rehabilitation Why: The outpatient therapy will contact you for the first appointment Contact information: [place] [phone] [place] [phone]              Time coordinating discharge: 39 minutes  Signed:  Willeen Novak  [facility] 02/02/2021, 4:13 PM

## 2021-02-03 ENCOUNTER — Encounter (HOSPITAL_COMMUNITY): Payer: Self-pay | Admitting: Hematology

## 2021-02-08 DIAGNOSIS — M810 Age-related osteoporosis without current pathological fracture: Secondary | ICD-10-CM | POA: Diagnosis not present

## 2021-02-08 DIAGNOSIS — E7849 Other hyperlipidemia: Secondary | ICD-10-CM | POA: Diagnosis not present

## 2021-02-08 DIAGNOSIS — I129 Hypertensive chronic kidney disease with stage 1 through stage 4 chronic kidney disease, or unspecified chronic kidney disease: Secondary | ICD-10-CM | POA: Diagnosis not present

## 2021-02-08 DIAGNOSIS — N183 Chronic kidney disease, stage 3 unspecified: Secondary | ICD-10-CM | POA: Diagnosis not present
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
## 2021-02-09 ENCOUNTER — Ambulatory Visit (INDEPENDENT_AMBULATORY_CARE_PROVIDER_SITE_OTHER): Payer: Medicare Other | Admitting: Family Medicine

## 2021-02-09 ENCOUNTER — Encounter: Payer: Self-pay | Admitting: Family Medicine

## 2021-02-09 ENCOUNTER — Other Ambulatory Visit: Payer: Self-pay

## 2021-02-09 VITALS — BP 182/78 | HR 72 | Ht 63.0 in | Wt 115.8 lb

## 2021-02-09 DIAGNOSIS — I2510 Atherosclerotic heart disease of native coronary artery without angina pectoris: Secondary | ICD-10-CM

## 2021-02-09 DIAGNOSIS — I10 Essential (primary) hypertension: Secondary | ICD-10-CM

## 2021-02-09 DIAGNOSIS — E7849 Other hyperlipidemia: Secondary | ICD-10-CM | POA: Diagnosis not present

## 2021-02-09 DIAGNOSIS — R569 Unspecified convulsions: Secondary | ICD-10-CM | POA: Diagnosis not present

## 2021-02-09 MED ORDER — AMLODIPINE BESYLATE 10 MG PO TABS: 10.0000 mg | ORAL_TABLET | Freq: Every day | ORAL | 3 refills | Status: DC

## 2021-02-09 NOTE — Patient Instructions (Addendum)
Medication Instructions:   Increase Amlodipine to 10mg  daily.   Continue all other medications.    Labwork: none  Testing/Procedures: none  Follow-Up: 1 month   Any Other Special Instructions Will Be Listed Below (If Applicable). Please call the office in 2 weeks with update on blood pressure readings.   If you need a refill on your cardiac medications before your next appointment, please call your pharmacy.

## 2021-02-15 DIAGNOSIS — M7062 Trochanteric bursitis, left hip: Secondary | ICD-10-CM | POA: Diagnosis not present

## 2021-02-15 DIAGNOSIS — S72002D Fracture of unspecified part of neck of left femur, subsequent encounter for closed fracture with routine healing: Secondary | ICD-10-CM | POA: Diagnosis not present

## 2021-02-23 ENCOUNTER — Ambulatory Visit (HOSPITAL_COMMUNITY): Payer: Medicare Other | Admitting: Occupational Therapy

## 2021-02-23 ENCOUNTER — Ambulatory Visit (HOSPITAL_COMMUNITY): Payer: Medicare Other | Admitting: Physical Therapy

## 2021-03-01 DIAGNOSIS — M25552 Pain in left hip: Secondary | ICD-10-CM | POA: Diagnosis not present

## 2021-03-01 DIAGNOSIS — R262 Difficulty in walking, not elsewhere classified: Secondary | ICD-10-CM | POA: Diagnosis not present

## 2021-03-01 DIAGNOSIS — M6281 Muscle weakness (generalized): Secondary | ICD-10-CM | POA: Diagnosis not present

## 2021-03-03 DIAGNOSIS — M25552 Pain in left hip: Secondary | ICD-10-CM | POA: Diagnosis not present

## 2021-03-03 DIAGNOSIS — M6281 Muscle weakness (generalized): Secondary | ICD-10-CM | POA: Diagnosis not present

## 2021-03-03 DIAGNOSIS — R569 Unspecified convulsions: Secondary | ICD-10-CM | POA: Diagnosis not present

## 2021-03-03 DIAGNOSIS — R262 Difficulty in walking, not elsewhere classified: Secondary | ICD-10-CM | POA: Diagnosis not present

## 2021-03-10 DIAGNOSIS — M6281 Muscle weakness (generalized): Secondary | ICD-10-CM | POA: Diagnosis not present

## 2021-03-10 DIAGNOSIS — R262 Difficulty in walking, not elsewhere classified: Secondary | ICD-10-CM | POA: Diagnosis not present

## 2021-03-10 DIAGNOSIS — M25552 Pain in left hip: Secondary | ICD-10-CM | POA: Diagnosis not present
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
## 2021-03-11 ENCOUNTER — Ambulatory Visit (INDEPENDENT_AMBULATORY_CARE_PROVIDER_SITE_OTHER): Payer: Medicare Other | Admitting: Family Medicine

## 2021-03-11 ENCOUNTER — Encounter: Payer: Self-pay | Admitting: Family Medicine

## 2021-03-11 VITALS — BP 122/60 | HR 68 | Ht 63.0 in | Wt 118.2 lb

## 2021-03-11 DIAGNOSIS — I2510 Atherosclerotic heart disease of native coronary artery without angina pectoris: Secondary | ICD-10-CM

## 2021-03-11 DIAGNOSIS — I10 Essential (primary) hypertension: Secondary | ICD-10-CM

## 2021-03-11 DIAGNOSIS — E7849 Other hyperlipidemia: Secondary | ICD-10-CM | POA: Diagnosis not present

## 2021-03-11 DIAGNOSIS — I129 Hypertensive chronic kidney disease with stage 1 through stage 4 chronic kidney disease, or unspecified chronic kidney disease: Secondary | ICD-10-CM | POA: Diagnosis not present

## 2021-03-11 DIAGNOSIS — M810 Age-related osteoporosis without current pathological fracture: Secondary | ICD-10-CM | POA: Diagnosis not present

## 2021-03-11 DIAGNOSIS — N183 Chronic kidney disease, stage 3 unspecified: Secondary | ICD-10-CM | POA: Diagnosis not present

## 2021-03-11 NOTE — Patient Instructions (Addendum)

## 2021-03-12 DIAGNOSIS — M25552 Pain in left hip: Secondary | ICD-10-CM | POA: Diagnosis not present

## 2021-03-12 DIAGNOSIS — M6281 Muscle weakness (generalized): Secondary | ICD-10-CM | POA: Diagnosis not present

## 2021-03-12 DIAGNOSIS — R262 Difficulty in walking, not elsewhere classified: Secondary | ICD-10-CM | POA: Diagnosis not present

## 2021-03-17 DIAGNOSIS — M25552 Pain in left hip: Secondary | ICD-10-CM | POA: Diagnosis not present

## 2021-03-17 DIAGNOSIS — M6281 Muscle weakness (generalized): Secondary | ICD-10-CM | POA: Diagnosis not present

## 2021-03-17 DIAGNOSIS — R262 Difficulty in walking, not elsewhere classified: Secondary | ICD-10-CM | POA: Diagnosis not present

## 2021-03-19 DIAGNOSIS — M6281 Muscle weakness (generalized): Secondary | ICD-10-CM | POA: Diagnosis not present

## 2021-03-19 DIAGNOSIS — M25552 Pain in left hip: Secondary | ICD-10-CM | POA: Diagnosis not present

## 2021-03-19 DIAGNOSIS — R262 Difficulty in walking, not elsewhere classified: Secondary | ICD-10-CM | POA: Diagnosis not present

## 2021-03-22 DIAGNOSIS — M25552 Pain in left hip: Secondary | ICD-10-CM | POA: Diagnosis not present

## 2021-03-22 DIAGNOSIS — R262 Difficulty in walking, not elsewhere classified: Secondary | ICD-10-CM | POA: Diagnosis not present

## 2021-03-22 DIAGNOSIS — M6281 Muscle weakness (generalized): Secondary | ICD-10-CM | POA: Diagnosis not present

## 2021-03-24 DIAGNOSIS — M6281 Muscle weakness (generalized): Secondary | ICD-10-CM | POA: Diagnosis not present

## 2021-03-24 DIAGNOSIS — M25552 Pain in left hip: Secondary | ICD-10-CM | POA: Diagnosis not present

## 2021-03-24 DIAGNOSIS — R262 Difficulty in walking, not elsewhere classified: Secondary | ICD-10-CM | POA: Diagnosis not present

## 2021-03-29 DIAGNOSIS — M7062 Trochanteric bursitis, left hip: Secondary | ICD-10-CM | POA: Diagnosis not present

## 2021-03-29 DIAGNOSIS — M7541 Impingement syndrome of right shoulder: Secondary | ICD-10-CM | POA: Diagnosis not present

## 2021-04-11 DIAGNOSIS — M810 Age-related osteoporosis without current pathological fracture: Secondary | ICD-10-CM | POA: Diagnosis not present

## 2021-04-11 DIAGNOSIS — N183 Chronic kidney disease, stage 3 unspecified: Secondary | ICD-10-CM | POA: Diagnosis not present

## 2021-04-11 DIAGNOSIS — E7849 Other hyperlipidemia: Secondary | ICD-10-CM | POA: Diagnosis not present

## 2021-04-11 DIAGNOSIS — I129 Hypertensive chronic kidney disease with stage 1 through stage 4 chronic kidney disease, or unspecified chronic kidney disease: Secondary | ICD-10-CM | POA: Diagnosis not present

## 2021-04-21 ENCOUNTER — Encounter: Payer: Self-pay | Admitting: Neurology

## 2021-04-21 ENCOUNTER — Other Ambulatory Visit: Payer: Self-pay

## 2021-04-21 ENCOUNTER — Ambulatory Visit (INDEPENDENT_AMBULATORY_CARE_PROVIDER_SITE_OTHER): Payer: Medicare Other | Admitting: Neurology

## 2021-04-21 VITALS — BP 141/68 | HR 62 | Ht 63.0 in | Wt 117.0 lb

## 2021-04-21 DIAGNOSIS — G40909 Epilepsy, unspecified, not intractable, without status epilepticus: Secondary | ICD-10-CM | POA: Diagnosis not present

## 2021-04-21 NOTE — Patient Instructions (Signed)
Continue with Levetiracetam 500 mg nightly  Will check Levetiracetam level today  Return to clinic in 6 months or sooner if worse  Refrain from driving for at least 6 months after a seizure   Per [facility] statutes, patients with seizures are not allowed to drive until they have been seizure-free for six months.  Other recommendations include using caution when using heavy equipment or power tools. Avoid working on ladders or at heights. Take showers instead of baths.  Do not swim alone.  Ensure the water temperature is not too high on the home water heater. Do not go swimming alone. Do not lock yourself in a room alone (i.e. bathroom). When caring for infants or small children, sit down when holding, feeding, or changing them to minimize risk of injury to the child in the event you have a seizure. Maintain good sleep hygiene. Avoid alcohol.  Also recommend adequate sleep, hydration, good diet and minimize stress.   During the Seizure  - First, ensure adequate ventilation and place patients on the floor on their left side  Loosen clothing around the neck and ensure the airway is patent. If the patient is clenching the teeth, do not force the mouth open with any object as this can cause severe damage - Remove all items from the surrounding that can be hazardous. The patient may be oblivious to what's happening and may not even know what he or she is doing. If the patient is confused and wandering, either gently guide him/her away and block access to outside areas - Reassure the individual and be comforting - Call 911. In most cases, the seizure ends before EMS arrives. However, there are cases when seizures may last over 3 to 5 minutes. Or the individual may have developed breathing difficulties or severe injuries. If a pregnant patient or a person with diabetes develops a seizure, it is prudent to call an ambulance. - Finally, if the patient does not regain full consciousness, then call  EMS. Most patients will remain confused for about 45 to 90 minutes after a seizure, so you must use judgment in calling for help. - Avoid restraints but make sure the patient is in a bed with padded side rails - Place the individual in a lateral position with the neck slightly flexed; this will help the saliva drain from the mouth and prevent the tongue from falling backward - Remove all nearby furniture and other hazards from the area - Provide verbal assurance as the individual is regaining consciousness - Provide the patient with privacy if possible - Call for help and start treatment as ordered by the caregiver   After the Seizure (Postictal Stage)  After a seizure, most patients experience confusion, fatigue, muscle pain and/or a headache. Thus, one should permit the individual to sleep. For the next few days, reassurance is essential. Being calm and helping reorient the person is also of importance.  Most seizures are painless and end spontaneously. Seizures are not harmful to others but can lead to complications such as stress on the lungs, brain and the heart. Individuals with prior lung problems may develop labored breathing and respiratory distress.
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
## 2021-04-24 LAB — LEVETIRACETAM LEVEL: Levetiracetam Lvl: 12 ug/mL (ref 10.0–40.0)

## 2021-04-29 ENCOUNTER — Ambulatory Visit: Payer: Medicare Other | Admitting: Neurology

## 2021-05-07 ENCOUNTER — Other Ambulatory Visit: Payer: Self-pay

## 2021-05-07 NOTE — Patient Outreach (Signed)
[facility]) Care Management  05/07/2021  MIN COPELAND 02/07/1934 [id number]   Telephone outreach to patient to obtain mRS was successfully completed. MRS= 0   [place] Care Management Assistant

## 2021-05-10 DIAGNOSIS — J329 Chronic sinusitis, unspecified: Secondary | ICD-10-CM | POA: Diagnosis not present

## 2021-05-12 DIAGNOSIS — N183 Chronic kidney disease, stage 3 unspecified: Secondary | ICD-10-CM | POA: Diagnosis not present

## 2021-05-12 DIAGNOSIS — M810 Age-related osteoporosis without current pathological fracture: Secondary | ICD-10-CM | POA: Diagnosis not present

## 2021-05-12 DIAGNOSIS — I129 Hypertensive chronic kidney disease with stage 1 through stage 4 chronic kidney disease, or unspecified chronic kidney disease: Secondary | ICD-10-CM | POA: Diagnosis not present

## 2021-05-12 DIAGNOSIS — E7849 Other hyperlipidemia: Secondary | ICD-10-CM | POA: Diagnosis not present

## 2021-05-14 DIAGNOSIS — E782 Mixed hyperlipidemia: Secondary | ICD-10-CM | POA: Diagnosis not present

## 2021-05-14 DIAGNOSIS — K2100 Gastro-esophageal reflux disease with esophagitis, without bleeding: Secondary | ICD-10-CM | POA: Diagnosis not present

## 2021-05-14 DIAGNOSIS — I10 Essential (primary) hypertension: Secondary | ICD-10-CM | POA: Diagnosis not present

## 2021-05-14 DIAGNOSIS — E7849 Other hyperlipidemia: Secondary | ICD-10-CM | POA: Diagnosis not present

## 2021-05-14 DIAGNOSIS — N1831 Chronic kidney disease, stage 3a: Secondary | ICD-10-CM | POA: Diagnosis not present

## 2021-05-14 DIAGNOSIS — E039 Hypothyroidism, unspecified: Secondary | ICD-10-CM | POA: Diagnosis not present

## 2021-05-18 DIAGNOSIS — I700 Atherosclerosis of aorta: Secondary | ICD-10-CM | POA: Diagnosis not present

## 2021-05-18 DIAGNOSIS — I10 Essential (primary) hypertension: Secondary | ICD-10-CM | POA: Diagnosis not present

## 2021-05-18 DIAGNOSIS — N1831 Chronic kidney disease, stage 3a: Secondary | ICD-10-CM | POA: Diagnosis not present

## 2021-05-18 DIAGNOSIS — Z23 Encounter for immunization: Secondary | ICD-10-CM | POA: Diagnosis not present

## 2021-05-18 DIAGNOSIS — R4582 Worries: Secondary | ICD-10-CM | POA: Diagnosis not present

## 2021-05-18 DIAGNOSIS — I25110 Atherosclerotic heart disease of native coronary artery with unstable angina pectoris: Secondary | ICD-10-CM | POA: Diagnosis not present

## 2021-05-18 DIAGNOSIS — E7849 Other hyperlipidemia: Secondary | ICD-10-CM | POA: Diagnosis not present

## 2021-05-18 DIAGNOSIS — E039 Hypothyroidism, unspecified: Secondary | ICD-10-CM | POA: Diagnosis not present

## 2021-05-20 ENCOUNTER — Other Ambulatory Visit: Payer: Self-pay

## 2021-05-20 ENCOUNTER — Inpatient Hospital Stay (HOSPITAL_COMMUNITY): Payer: Medicare Other | Attending: Hematology

## 2021-05-20 DIAGNOSIS — Z79899 Other long term (current) drug therapy: Secondary | ICD-10-CM | POA: Diagnosis not present

## 2021-05-20 DIAGNOSIS — D509 Iron deficiency anemia, unspecified: Secondary | ICD-10-CM | POA: Diagnosis not present

## 2021-05-20 DIAGNOSIS — E538 Deficiency of other specified B group vitamins: Secondary | ICD-10-CM | POA: Insufficient documentation

## 2021-05-20 DIAGNOSIS — D508 Other iron deficiency anemias: Secondary | ICD-10-CM

## 2021-05-20 DIAGNOSIS — E559 Vitamin D deficiency, unspecified: Secondary | ICD-10-CM | POA: Diagnosis not present

## 2021-05-20 DIAGNOSIS — Z7901 Long term (current) use of anticoagulants: Secondary | ICD-10-CM | POA: Insufficient documentation

## 2021-05-20 LAB — FERRITIN: Ferritin: 80 ng/mL (ref 11–307)

## 2021-05-20 LAB — CBC WITH DIFFERENTIAL/PLATELET
Abs Immature Granulocytes: 0.04 10*3/uL (ref 0.00–0.07)
Basophils Absolute: 0 10*3/uL (ref 0.0–0.1)
Basophils Relative: 1 %
Eosinophils Absolute: 0.1 10*3/uL (ref 0.0–0.5)
Eosinophils Relative: 1 %
HCT: 42.2 % (ref 36.0–46.0)
Hemoglobin: 13.6 g/dL (ref 12.0–15.0)
Immature Granulocytes: 1 %
Lymphocytes Relative: 15 %
Lymphs Abs: 1 10*3/uL (ref 0.7–4.0)
MCH: 31.3 pg (ref 26.0–34.0)
MCHC: 32.2 g/dL (ref 30.0–36.0)
MCV: 97 fL (ref 80.0–100.0)
Monocytes Absolute: 0.6 10*3/uL (ref 0.1–1.0)
Monocytes Relative: 9 %
Neutro Abs: 4.9 10*3/uL (ref 1.7–7.7)
Neutrophils Relative %: 73 %
Platelets: 264 10*3/uL (ref 150–400)
RBC: 4.35 MIL/uL (ref 3.87–5.11)
RDW: 14.1 % (ref 11.5–15.5)
WBC: 6.7 10*3/uL (ref 4.0–10.5)
nRBC: 0 % (ref 0.0–0.2)

## 2021-05-20 LAB — IRON AND TIBC
Iron: 63 ug/dL (ref 28–170)
Saturation Ratios: 18 % (ref 10.4–31.8)
TIBC: 351 ug/dL (ref 250–450)
UIBC: 288 ug/dL

## 2021-05-20 LAB — VITAMIN B12: Vitamin B-12: 545 pg/mL (ref 180–914)

## 2021-05-20 LAB — VITAMIN D 25 HYDROXY (VIT D DEFICIENCY, FRACTURES): Vit D, 25-Hydroxy: 74.55 ng/mL (ref 30–100)

## 2021-05-27 ENCOUNTER — Inpatient Hospital Stay (HOSPITAL_BASED_OUTPATIENT_CLINIC_OR_DEPARTMENT_OTHER): Payer: Medicare Other | Admitting: Hematology

## 2021-05-27 ENCOUNTER — Other Ambulatory Visit: Payer: Self-pay

## 2021-05-27 VITALS — BP 133/55 | HR 63 | Temp 97.0°F | Resp 18 | Wt 119.4 lb

## 2021-05-27 DIAGNOSIS — D508 Other iron deficiency anemias: Secondary | ICD-10-CM | POA: Diagnosis not present

## 2021-05-27 DIAGNOSIS — Z7901 Long term (current) use of anticoagulants: Secondary | ICD-10-CM | POA: Diagnosis not present

## 2021-05-27 DIAGNOSIS — E559 Vitamin D deficiency, unspecified: Secondary | ICD-10-CM | POA: Diagnosis not present

## 2021-05-27 DIAGNOSIS — Z79899 Other long term (current) drug therapy: Secondary | ICD-10-CM | POA: Diagnosis not present

## 2021-05-27 DIAGNOSIS — D509 Iron deficiency anemia, unspecified: Secondary | ICD-10-CM | POA: Diagnosis not present

## 2021-05-27 DIAGNOSIS — E538 Deficiency of other specified B group vitamins: Secondary | ICD-10-CM | POA: Diagnosis not present

## 2021-05-27 NOTE — Patient Instructions (Addendum)
[facility] at [facility] °Discharge Instructions ° °You were seen today by Dr. Katragadda. He went over your recent results. Dr. Katragadda will see you back in 4 months for labs and follow up. ° ° °Thank you for choosing [facility] at [facility] to provide your oncology and hematology care.  To afford each patient quality time with our provider, please arrive at least 15 minutes before your scheduled appointment time.  ° °If you have a lab appointment with the Cancer Center please come in thru the Main Entrance and check in at the main information desk ° °You need to re-schedule your appointment should you arrive 10 or more minutes late.  We strive to give you quality time with our providers, and arriving late affects you and other patients whose appointments are after yours.  Also, if you no show three or more times for appointments you may be dismissed from the clinic at the providers discretion.     °Again, thank you for choosing [facility].  Our hope is that these requests will decrease the amount of time that you wait before being seen by our physicians.       °_____________________________________________________________ ° °Should you have questions after your visit to [facility], please contact our office at [phone] between the hours of 8:00 a.m. and 4:30 p.m.  Voicemails left after 4:00 p.m. will not be returned until the following business day.  For prescription refill requests, have your pharmacy contact our office and allow 72 hours.   ° °Cancer Center Support Programs:  ° °> Cancer Support Group  °2nd Tuesday of the month 1pm-2pm, Journey Room  ° ° °
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
## 2021-05-29 ENCOUNTER — Encounter (HOSPITAL_COMMUNITY): Payer: Self-pay | Admitting: Hematology

## 2021-06-25 DIAGNOSIS — M7062 Trochanteric bursitis, left hip: Secondary | ICD-10-CM | POA: Diagnosis not present

## 2021-07-01 DIAGNOSIS — H905 Unspecified sensorineural hearing loss: Secondary | ICD-10-CM | POA: Diagnosis not present

## 2021-07-01 DIAGNOSIS — H04123 Dry eye syndrome of bilateral lacrimal glands: Secondary | ICD-10-CM | POA: Diagnosis not present

## 2021-07-05 DIAGNOSIS — M7062 Trochanteric bursitis, left hip: Secondary | ICD-10-CM | POA: Diagnosis not present

## 2021-08-11 DIAGNOSIS — N183 Chronic kidney disease, stage 3 unspecified: Secondary | ICD-10-CM | POA: Diagnosis not present

## 2021-08-11 DIAGNOSIS — I129 Hypertensive chronic kidney disease with stage 1 through stage 4 chronic kidney disease, or unspecified chronic kidney disease: Secondary | ICD-10-CM | POA: Diagnosis not present

## 2021-08-11 DIAGNOSIS — M810 Age-related osteoporosis without current pathological fracture: Secondary | ICD-10-CM | POA: Diagnosis not present

## 2021-08-11 DIAGNOSIS — E7849 Other hyperlipidemia: Secondary | ICD-10-CM | POA: Diagnosis not present

## 2021-08-23 ENCOUNTER — Encounter (HOSPITAL_COMMUNITY): Payer: Self-pay

## 2021-08-23 ENCOUNTER — Emergency Department (HOSPITAL_COMMUNITY): Payer: Medicare Other

## 2021-08-23 ENCOUNTER — Other Ambulatory Visit: Payer: Self-pay

## 2021-08-23 ENCOUNTER — Observation Stay (HOSPITAL_COMMUNITY)
Admission: EM | Admit: 2021-08-23 | Discharge: 2021-08-24 | Disposition: A | Discharge status: Home / Self Care | Payer: Medicare Other | Attending: Family Medicine | Admitting: Family Medicine

## 2021-08-23 DIAGNOSIS — E039 Hypothyroidism, unspecified: Secondary | ICD-10-CM | POA: Diagnosis not present

## 2021-08-23 DIAGNOSIS — G4486 Cervicogenic headache: Secondary | ICD-10-CM | POA: Diagnosis present

## 2021-08-23 DIAGNOSIS — Z79899 Other long term (current) drug therapy: Secondary | ICD-10-CM | POA: Diagnosis not present

## 2021-08-23 DIAGNOSIS — E559 Vitamin D deficiency, unspecified: Secondary | ICD-10-CM

## 2021-08-23 DIAGNOSIS — Z743 Need for continuous supervision: Secondary | ICD-10-CM | POA: Diagnosis not present

## 2021-08-23 DIAGNOSIS — R519 Headache, unspecified: Secondary | ICD-10-CM | POA: Insufficient documentation

## 2021-08-23 DIAGNOSIS — R079 Chest pain, unspecified: Secondary | ICD-10-CM

## 2021-08-23 DIAGNOSIS — Z85828 Personal history of other malignant neoplasm of skin: Secondary | ICD-10-CM | POA: Diagnosis not present

## 2021-08-23 DIAGNOSIS — G8929 Other chronic pain: Secondary | ICD-10-CM | POA: Diagnosis present

## 2021-08-23 DIAGNOSIS — R000 Tachycardia, unspecified: Secondary | ICD-10-CM | POA: Diagnosis not present

## 2021-08-23 DIAGNOSIS — I25119 Atherosclerotic heart disease of native coronary artery with unspecified angina pectoris: Secondary | ICD-10-CM | POA: Diagnosis not present

## 2021-08-23 DIAGNOSIS — Z20822 Contact with and (suspected) exposure to covid-19: Secondary | ICD-10-CM | POA: Insufficient documentation

## 2021-08-23 DIAGNOSIS — R2689 Other abnormalities of gait and mobility: Secondary | ICD-10-CM | POA: Insufficient documentation

## 2021-08-23 DIAGNOSIS — R0789 Other chest pain: Principal | ICD-10-CM | POA: Insufficient documentation

## 2021-08-23 DIAGNOSIS — M542 Cervicalgia: Secondary | ICD-10-CM | POA: Diagnosis not present

## 2021-08-23 DIAGNOSIS — I2510 Atherosclerotic heart disease of native coronary artery without angina pectoris: Secondary | ICD-10-CM | POA: Diagnosis not present

## 2021-08-23 DIAGNOSIS — I499 Cardiac arrhythmia, unspecified: Secondary | ICD-10-CM | POA: Diagnosis not present

## 2021-08-23 DIAGNOSIS — I10 Essential (primary) hypertension: Secondary | ICD-10-CM | POA: Diagnosis not present

## 2021-08-23 DIAGNOSIS — R569 Unspecified convulsions: Secondary | ICD-10-CM

## 2021-08-23 DIAGNOSIS — Z9181 History of falling: Secondary | ICD-10-CM

## 2021-08-23 DIAGNOSIS — R6889 Other general symptoms and signs: Secondary | ICD-10-CM | POA: Diagnosis not present

## 2021-08-23 LAB — TROPONIN I (HIGH SENSITIVITY)
Troponin I (High Sensitivity): 11 ng/L (ref <18)
Troponin I (High Sensitivity): 22 ng/L — ABNORMAL HIGH (ref <18)
Troponin I (High Sensitivity): 23 ng/L — ABNORMAL HIGH (ref <18)
Troponin I (High Sensitivity): 34 ng/L — ABNORMAL HIGH (ref <18)

## 2021-08-23 LAB — CBC WITH DIFFERENTIAL/PLATELET
Abs Immature Granulocytes: 0.02 10*3/uL (ref 0.00–0.07)
Basophils Absolute: 0 10*3/uL (ref 0.0–0.1)
Basophils Relative: 0 %
Eosinophils Absolute: 0.2 10*3/uL (ref 0.0–0.5)
Eosinophils Relative: 3 %
HCT: 40.4 % (ref 36.0–46.0)
Hemoglobin: 12.6 g/dL (ref 12.0–15.0)
Immature Granulocytes: 0 %
Lymphocytes Relative: 20 %
Lymphs Abs: 1.1 10*3/uL (ref 0.7–4.0)
MCH: 29.9 pg (ref 26.0–34.0)
MCHC: 31.2 g/dL (ref 30.0–36.0)
MCV: 96 fL (ref 80.0–100.0)
Monocytes Absolute: 0.5 10*3/uL (ref 0.1–1.0)
Monocytes Relative: 10 %
Neutro Abs: 3.4 10*3/uL (ref 1.7–7.7)
Neutrophils Relative %: 67 %
Platelets: 260 10*3/uL (ref 150–400)
RBC: 4.21 MIL/uL (ref 3.87–5.11)
RDW: 12.4 % (ref 11.5–15.5)
WBC: 5.1 10*3/uL (ref 4.0–10.5)
nRBC: 0 % (ref 0.0–0.2)

## 2021-08-23 LAB — BASIC METABOLIC PANEL
Anion gap: 9 (ref 5–15)
BUN: 17 mg/dL (ref 8–23)
CO2: 27 mmol/L (ref 22–32)
Calcium: 9 mg/dL (ref 8.9–10.3)
Chloride: 101 mmol/L (ref 98–111)
Creatinine, Ser: 0.86 mg/dL (ref 0.44–1.00)
GFR, Estimated: >60 mL/min (ref >60)
Glucose, Bld: 111 mg/dL — ABNORMAL HIGH (ref 70–99)
Potassium: 3.7 mmol/L (ref 3.5–5.1)
Sodium: 137 mmol/L (ref 135–145)

## 2021-08-23 LAB — RESP PANEL BY RT-PCR (FLU A&B, COVID) ARPGX2
Influenza A by PCR: NEGATIVE
Influenza B by PCR: NEGATIVE
SARS Coronavirus 2 by RT PCR: NEGATIVE

## 2021-08-23 MED ORDER — AMLODIPINE BESYLATE 5 MG PO TABS
10.0000 mg | ORAL_TABLET | Freq: Every day | ORAL | Status: DC
  Administered 2021-08-24: 10 mg via ORAL
  Filled 2021-08-23: qty 2

## 2021-08-23 MED ORDER — METOPROLOL SUCCINATE ER 25 MG PO TB24
12.5000 mg | ORAL_TABLET | Freq: Every day | ORAL | Status: DC
  Administered 2021-08-24: 12.5 mg via ORAL
  Filled 2021-08-23: qty 1

## 2021-08-23 MED ORDER — HYDROCHLOROTHIAZIDE 12.5 MG PO TABS
12.5000 mg | ORAL_TABLET | Freq: Every day | ORAL | Status: DC
  Administered 2021-08-24: 12.5 mg via ORAL
  Filled 2021-08-23: qty 1

## 2021-08-23 MED ORDER — HYDROCODONE-ACETAMINOPHEN 5-325 MG PO TABS
1.0000 | ORAL_TABLET | Freq: Once | ORAL | Status: AC
  Administered 2021-08-23: 1 via ORAL
  Filled 2021-08-23: qty 1

## 2021-08-23 MED ORDER — POLYETHYLENE GLYCOL 3350 17 G PO PACK: 17.0000 g | PACK | Freq: Every day | ORAL | Status: DC | PRN

## 2021-08-23 MED ORDER — ACETAMINOPHEN 325 MG PO TABS: 650.0000 mg | ORAL_TABLET | Freq: Four times a day (QID) | ORAL | Status: DC | PRN

## 2021-08-23 MED ORDER — LABETALOL HCL 5 MG/ML IV SOLN: 10.0000 mg | INTRAVENOUS | Status: DC | PRN

## 2021-08-23 MED ORDER — ALPRAZOLAM 0.5 MG PO TABS
0.5000 mg | ORAL_TABLET | Freq: Two times a day (BID) | ORAL | Status: DC | PRN
  Administered 2021-08-23: 0.5 mg via ORAL
  Filled 2021-08-23: qty 1

## 2021-08-23 MED ORDER — LEVOTHYROXINE SODIUM 75 MCG PO TABS
150.0000 ug | ORAL_TABLET | Freq: Every day | ORAL | Status: DC
Start: 2021-08-24 — End: 2021-08-24
  Administered 2021-08-24: 150 ug via ORAL
  Filled 2021-08-23: qty 2

## 2021-08-23 MED ORDER — LEVETIRACETAM 500 MG PO TABS
500.0000 mg | ORAL_TABLET | Freq: Two times a day (BID) | ORAL | Status: DC
  Administered 2021-08-23 – 2021-08-24 (×2): 500 mg via ORAL
  Filled 2021-08-23 (×2): qty 1

## 2021-08-23 MED ORDER — ASPIRIN EC 81 MG PO TBEC
81.0000 mg | DELAYED_RELEASE_TABLET | Freq: Every day | ORAL | Status: DC
Start: 2021-08-24 — End: 2021-08-24
  Administered 2021-08-24: 81 mg via ORAL
  Filled 2021-08-23: qty 1

## 2021-08-23 MED ORDER — HYDRALAZINE HCL 20 MG/ML IJ SOLN
10.0000 mg | INTRAMUSCULAR | Status: DC | PRN
  Administered 2021-08-23: 10 mg via INTRAVENOUS
  Filled 2021-08-23: qty 1

## 2021-08-23 MED ORDER — ONDANSETRON HCL 4 MG PO TABS
4.0000 mg | ORAL_TABLET | Freq: Four times a day (QID) | ORAL | Status: DC | PRN
  Administered 2021-08-23: 4 mg via ORAL
  Filled 2021-08-23: qty 1

## 2021-08-23 MED ORDER — ACETAMINOPHEN 650 MG RE SUPP: 650.0000 mg | Freq: Four times a day (QID) | RECTAL | Status: DC | PRN

## 2021-08-23 MED ORDER — MORPHINE SULFATE (PF) 2 MG/ML IV SOLN
2.0000 mg | INTRAVENOUS | Status: DC | PRN
  Administered 2021-08-23: 2 mg via INTRAVENOUS
  Filled 2021-08-23: qty 1

## 2021-08-23 MED ORDER — ENOXAPARIN SODIUM 40 MG/0.4ML IJ SOSY
40.0000 mg | PREFILLED_SYRINGE | INTRAMUSCULAR | Status: DC
  Administered 2021-08-24: 40 mg via SUBCUTANEOUS
  Filled 2021-08-23: qty 0.4

## 2021-08-23 MED ORDER — LOSARTAN POTASSIUM 50 MG PO TABS
100.0000 mg | ORAL_TABLET | Freq: Every day | ORAL | Status: DC
  Administered 2021-08-24: 100 mg via ORAL
  Filled 2021-08-23: qty 2

## 2021-08-23 MED ORDER — ISOSORBIDE MONONITRATE ER 30 MG PO TB24
15.0000 mg | ORAL_TABLET | Freq: Every day | ORAL | Status: DC
  Administered 2021-08-24: 15 mg via ORAL
  Filled 2021-08-23: qty 1

## 2021-08-23 MED ORDER — ONDANSETRON HCL 4 MG/2ML IJ SOLN: 4.0000 mg | Freq: Four times a day (QID) | INTRAMUSCULAR | Status: DC | PRN

## 2021-08-23 NOTE — ED Triage Notes (Signed)
Pt arrived via EMS with complaints of chest pain x3 weeks. Pt stated that chest pain is on left side. Pt states that it started around 3:00am this morning. Pt has had a total of 5 nitro since 0800.

## 2021-08-23 NOTE — ED Notes (Signed)
Unable to sign MSE no sign pad. Pt verbalized MSE. EMS stated that pt has DNR and family would bring it.
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
## 2021-08-23 NOTE — ED Provider Notes (Signed)
Emergency Medicine Provider Triage Evaluation Note  Andrea Jackson , a 87 y.o. female  was evaluated in triage.  Pt complains of intermittent chest pain ongoing for a week.  She was able to sleep all night last night without pain.  She woke today and noticed left anterior chest pain.  She decided come here to get checked out.  She denies diaphoresis, nausea, vomiting, cough or shortness of breath.  Review of Systems  Positive: Chest pain Negative: No diaphoresis, no nausea, no weakness  Physical Exam  BP (!) 183/86   Pulse 66   Temp 98.8 F (37.1 C) (Oral)   Resp 19   Ht 5\' 3"  (1.6 m)   Wt 54.2 kg   SpO2 95%   BMI 21.17 kg/m  Gen:   Awake, no distress   Resp:  Normal effort  MSK:   Moves extremities without difficulty  Other:  [place]  Medical Decision Making  Medically screening exam initiated at 2:57 PM.  Appropriate orders placed.  Jonne Ply was informed that the remainder of the evaluation will be completed by another provider, this initial triage assessment does not replace that evaluation, and the importance of remaining in the ED until their evaluation is complete.     Daleen Bo, MD 08/23/21 [phone]

## 2021-08-23 NOTE — ED Notes (Signed)
Called Acute Care to give report, nurse is busy with another patient and is unable to take report.
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
## 2021-08-24 ENCOUNTER — Telehealth: Payer: Self-pay

## 2021-08-24 ENCOUNTER — Encounter (HOSPITAL_COMMUNITY): Payer: Self-pay | Admitting: Internal Medicine

## 2021-08-24 ENCOUNTER — Other Ambulatory Visit (HOSPITAL_COMMUNITY): Payer: Medicare Other

## 2021-08-24 DIAGNOSIS — R569 Unspecified convulsions: Secondary | ICD-10-CM | POA: Diagnosis not present

## 2021-08-24 DIAGNOSIS — Z20822 Contact with and (suspected) exposure to covid-19: Secondary | ICD-10-CM | POA: Diagnosis not present

## 2021-08-24 DIAGNOSIS — I10 Essential (primary) hypertension: Secondary | ICD-10-CM

## 2021-08-24 DIAGNOSIS — R0789 Other chest pain: Secondary | ICD-10-CM | POA: Diagnosis not present

## 2021-08-24 DIAGNOSIS — Z9181 History of falling: Secondary | ICD-10-CM

## 2021-08-24 DIAGNOSIS — G8929 Other chronic pain: Secondary | ICD-10-CM

## 2021-08-24 DIAGNOSIS — R519 Headache, unspecified: Secondary | ICD-10-CM

## 2021-08-24 DIAGNOSIS — Z85828 Personal history of other malignant neoplasm of skin: Secondary | ICD-10-CM | POA: Diagnosis not present

## 2021-08-24 DIAGNOSIS — E039 Hypothyroidism, unspecified: Secondary | ICD-10-CM | POA: Diagnosis not present

## 2021-08-24 DIAGNOSIS — M542 Cervicalgia: Secondary | ICD-10-CM | POA: Diagnosis present

## 2021-08-24 DIAGNOSIS — I2510 Atherosclerotic heart disease of native coronary artery without angina pectoris: Secondary | ICD-10-CM | POA: Diagnosis not present

## 2021-08-24 DIAGNOSIS — Z79899 Other long term (current) drug therapy: Secondary | ICD-10-CM | POA: Diagnosis not present

## 2021-08-24 DIAGNOSIS — I25119 Atherosclerotic heart disease of native coronary artery with unspecified angina pectoris: Secondary | ICD-10-CM | POA: Diagnosis not present

## 2021-08-24 DIAGNOSIS — G4486 Cervicogenic headache: Secondary | ICD-10-CM

## 2021-08-24 DIAGNOSIS — R079 Chest pain, unspecified: Secondary | ICD-10-CM

## 2021-08-24 LAB — TROPONIN I (HIGH SENSITIVITY): Troponin I (High Sensitivity): 23 ng/L — ABNORMAL HIGH (ref <18)

## 2021-08-24 MED ORDER — HYDROCODONE-ACETAMINOPHEN 5-325 MG PO TABS: 1.0000 | ORAL_TABLET | Freq: Four times a day (QID) | ORAL | Status: DC | PRN

## 2021-08-24 MED ORDER — ISOSORBIDE MONONITRATE ER 30 MG PO TB24: 30.0000 mg | ORAL_TABLET | Freq: Every day | ORAL | 3 refills | Status: DC

## 2021-08-24 MED ORDER — ISOSORBIDE MONONITRATE ER 60 MG PO TB24: 30.0000 mg | ORAL_TABLET | Freq: Every day | ORAL | Status: DC

## 2021-08-24 MED ORDER — ERGOCALCIFEROL 1.25 MG (50000 UT) PO CAPS: 50000.0000 [IU] | ORAL_CAPSULE | ORAL | Status: DC

## 2021-08-24 MED ORDER — ACETAMINOPHEN 325 MG PO TABS
650.0000 mg | ORAL_TABLET | Freq: Four times a day (QID) | ORAL | Status: DC | PRN
Start: 2021-08-24 — End: 2022-06-14

## 2021-08-24 NOTE — Discharge Instructions (Addendum)
PLEASE DISCUSS HEAD AND NECK PAIN WITH YOUR PRIMARY CARE PROVIDER AND DISCUSS CT SCAN RESULTS.   IF NECK PAIN PERSISTS OR GETS WORSE SCHEDULE AN APPOINTMENT WITH [facility] AND [facility].    PLEASE GO TO STRESS TESTING SCHEDULED FOR 12/15 AT 0845 AM   PLEASE FOLLOW UP WITH CARDIOLOGY ON 12/19 AT 11:40 AM AS SCHEDULED  TAKE TYLENOL EVERY 6 HOURS AS NEEDED FOR NECK AND HEAD PAIN     IMPORTANT INFORMATION: PAY CLOSE ATTENTION   PHYSICIAN DISCHARGE INSTRUCTIONS  Follow with Primary care provider  Caryl Bis, MD  and other consultants as instructed by your Hospitalist Physician  [place] IF SYMPTOMS COME BACK, WORSEN OR NEW PROBLEM DEVELOPS   Please note: You were cared for by a hospitalist during your hospital stay. Every effort will be made to forward records to your primary care provider.  You can request that your primary care provider send for your hospital records if they have not received them.  Once you are discharged, your primary care physician will handle any further medical issues. Please note that NO REFILLS for any discharge medications will be authorized once you are discharged, as it is imperative that you return to your primary care physician (or establish a relationship with a primary care physician if you do not have one) for your post hospital discharge needs so that they can reassess your need for medications and monitor your lab values.  Please get a complete blood count and chemistry panel checked by your Primary MD at your next visit, and again as instructed by your Primary MD.  Get Medicines reviewed and adjusted: Please take all your medications with you for your next visit with your Primary MD  Laboratory/radiological data: Please request your Primary MD to go over all hospital tests and procedure/radiological results at the follow up, please ask your primary care provider to get all Hospital records sent  to his/her office.  In some cases, they will be blood work, cultures and biopsy results pending at the time of your discharge. Please request that your primary care provider follow up on these results.  If you are diabetic, please bring your blood sugar readings with you to your follow up appointment with primary care.    Please call and make your follow up appointments as soon as possible.    Also Note the following: If you experience worsening of your admission symptoms, develop shortness of breath, life threatening emergency, suicidal or homicidal thoughts you must seek medical attention immediately by calling 911 or calling your MD immediately  if symptoms less severe.  You must read complete instructions/literature along with all the possible adverse reactions/side effects for all the Medicines you take and that have been prescribed to you. Take any new Medicines after you have completely understood and accpet all the possible adverse reactions/side effects.   Do not drive when taking Pain medications or sleeping medications (Benzodiazepines)  Do not take more than prescribed Pain, Sleep and Anxiety Medications. It is not advisable to combine anxiety,sleep and pain medications without talking with your primary care practitioner  Special Instructions: If you have smoked or chewed Tobacco  in the last 2 yrs please stop smoking, stop any regular Alcohol  and or any Recreational drug use.  Wear Seat belts while driving.  Do not drive if taking any narcotic, mind altering or controlled substances or recreational drugs or alcohol.

## 2021-08-24 NOTE — Evaluation (Signed)
Physical Therapy Evaluation Only Patient Details Name: Andrea Jackson MRN: [id number] DOB: 18-Mar-1934 Today's Date: 08/24/2021  History of Present Illness  Andrea Jackson is a 87 y.o. female presented to the ED with complaints of intermittent chest pains of 3 weeks duration. PMH: HTN, MI, seizure   Clinical Impression  Pt reports being independent at baseline, uses [facility] when going to grocery store, drives, grandson lives with her and can assist as needed (works nights). Pt with symmetrical BLE strength, powers to stand without assist or cues, ambulates with arms reaching towards furniture so offered RW with improved safety. Pt ambulates 200 ft in hallway with RW, completes direction turns and head turns without LOB, no dyspnea noted, denies chest pain, dizziness, lightheadedness or general pain. Pt's family in room states mobility looks normal and they don't have concerns for mobility; requesting to speak with nurse/doctor regarding UTI, BP and d/c- notified all. No acute PT needs identified, will sign off at this time.       Recommendations for follow up therapy are one component of a multi-disciplinary discharge planning process, led by the attending physician.  Recommendations may be updated based on patient status, additional functional criteria and insurance authorization.  Follow Up Recommendations No PT follow up    Assistance Recommended at Discharge PRN  Functional Status Assessment Patient has not had a recent decline in their functional status  Equipment Recommendations  None recommended by PT    Recommendations for Other Services       Precautions / Restrictions Precautions Precautions: Fall Restrictions Weight Bearing Restrictions: No      Mobility  Bed Mobility  General bed mobility comments: sitting EOB    Transfers Overall transfer level: Modified independent Equipment used: Rolling walker (2 wheels);None  General transfer comment: powers to stand with RW and  without AD, good steadiness, no assist or cues    Ambulation/Gait Ambulation/Gait assistance: Supervision Gait Distance (Feet): 200 Feet Assistive device: Rolling walker (2 wheels) Gait Pattern/deviations: WFL(Within Functional Limits) Gait velocity: slightly decreased  General Gait Details: initially ambulates without AD reaching out for furniture/wall, offered RW and education on safety and pt ambulates in hallway with RW, able to complete head turns and direction changes without LOB  Stairs            Wheelchair Mobility    Modified Rankin (Stroke Patients Only)       Balance Overall balance assessment: No apparent balance deficits (not formally assessed)        Pertinent Vitals/Pain Pain Assessment: No/denies pain    Home Living Family/patient expects to be discharged to:: Private residence Living Arrangements: Other relatives Available Help at Discharge: Family;Available PRN/intermittently Type of Home: House Home Access: Level entry       Home Layout: One level Home Equipment: Conservation officer, nature (2 wheels);Rollator (4 wheels);Cane - single point;Shower seat;BSC/3in1      Prior Function Prior Level of Function : Independent/Modified Independent  Mobility Comments: pt reports ind with transfers, community ambulation and light yardwork. Pt reports occasionally uses [facility] to go to grocery store. ADLs Comments: pt reports ind with ADLs/IADLs, still drives, grandson present if needs physical assist     Hand Dominance        Extremity/Trunk Assessment   Upper Extremity Assessment Upper Extremity Assessment: Overall WFL for tasks assessed    Lower Extremity Assessment Lower Extremity Assessment: Overall WFL for tasks assessed (AROM WNL, strength 4/5 throughout, denies numbness/tingling, symmetrical)    Cervical / Trunk Assessment Cervical /  Trunk Assessment: Normal  Communication   Communication: No difficulties  Cognition Arousal/Alertness:  Awake/alert Behavior During Therapy: WFL for tasks assessed/performed Overall Cognitive Status: Within Functional Limits for tasks assessed     General Comments      Exercises     Assessment/Plan    PT Assessment Patient does not need any further PT services  PT Problem List         PT Treatment Interventions      PT Goals (Current goals can be found in the Care Plan section)  Acute Rehab PT Goals Patient Stated Goal: return home PT Goal Formulation: All assessment and education complete, DC therapy    Frequency     Barriers to discharge        Co-evaluation               AM-PAC PT "6 Clicks" Mobility  Outcome Measure Help needed turning from your back to your side while in a flat bed without using bedrails?: None Help needed moving from lying on your back to sitting on the side of a flat bed without using bedrails?: None Help needed moving to and from a bed to a chair (including a wheelchair)?: None Help needed standing up from a chair using your arms (e.g., wheelchair or bedside chair)?: A Little Help needed to walk in hospital room?: A Little Help needed climbing 3-5 steps with a railing? : A Little 6 Click Score: 21    End of Session   Activity Tolerance: Patient tolerated treatment well Patient left: in bed;with call bell/phone within reach;with family/visitor present Nurse Communication: Mobility status;Other (comment) (family has questions regarding UTI, BP, d/c) PT Visit Diagnosis: Other abnormalities of gait and mobility (R26.89)    Time: 2979-8921 PT Time Calculation (min) (ACUTE ONLY): 13 min   Charges:   PT Evaluation $PT Eval Low Complexity: 1 Low           Tori Shan Padgett PT, DPT 08/24/21, 1:50 PM

## 2021-08-24 NOTE — Clinical Social Work Note (Signed)
Patient does not wish to go to rehab although she states that she feels weak, feels it may be due to a medications adjustment. Patient lives with great grandson.    Pawel Soules, Clydene Pugh, LCSW

## 2021-08-24 NOTE — Discharge Summary (Signed)
Physician Discharge Summary  Andrea Jackson VQM:[id number] DOB: [birth year] DOA: 08/23/2021  PCP: Caryl Bis, MD Cardiologist: Dr. Domenic Polite   Admit date: 08/23/2021 Discharge date: 08/24/2021  Admitted From:  Home  Disposition: Home   Recommendations for Outpatient Follow-up:  Follow up with PCP in 1 weeks to discuss headaches, neck pain and review CT scans Follow up with scheduled nuclear stress test for 12/15 Follow up with cardiology on 12/19 as scheduled   Discharge Condition: STABLE   CODE STATUS: DNR DIET: heart healthy recommended   Brief Hospitalization Summary: Please see all hospital notes, images, labs for full details of the hospitalization. ADMISSION HPI: Andrea Jackson is a 87 y.o. female with medical history significant for hypertension, aortic dissection, GI bleed, seizures.   Patient presented to the ED with complaints of intermittent chest pains of 3 weeks duration.  She reports left-sided chest pain underneath her breast, not related to activity, occurs at rest, and sometimes with exertion she would not have to chest pain.  No associated difficulty breathing.  No leg swelling.  She reports history of heart disease Chest pain is nonradiating.  She took multiple doses of nitroglycerin before getting relief from her chest pain.    She also reports pain in the back of her head and her neck which started after a fall about 2 weeks ago.  Patient reports she was outside it was raining, she tried to pull a rock which was slippery from the part of flowing water, it slipped from her hand and she fell backwards hitting her head on concrete on her driveway.   ED Course: Blood pressure systolic elevated up to 932/67, diastolic elevated to 124.  Otherwise stable vitals.  Afebrile.  Troponin 11 >> 22 > 34 . WBC 5.1.  EKG per ED provider with nonspecific T wave flattening and no acute changes from prior.  Sinus.  Hospitalist to admit for persistent chest pain.  HOSPITAL COURSE  Pt  was admitted for chest pain observation.  She had serial high-sensitivity troponin tests that have been reassuring.  She was initially noted to have very high blood pressure likely due to pain symptoms.  However after being restarted on her home blood pressure medication regimen her blood pressures have come down nicely.  She reported that she had a mechanical fall about 2 weeks ago injuring back of her head and neck.  She had a CT scan of the head and cervical spine.  There were only chronic changes noted but of note she does have significant foraminal narrowing on the left at C3-C4, C4-C5 and C5-C6.  I have asked her to follow-up with her primary care physician to discuss further recommendations.  If symptoms worsen of asked her to schedule an appointment with [place] [facility].  Also because she is having cervicogenic headaches from this I have placed an ambulatory referral to [facility] neurology for her to discuss treatment options with the neurologist.  She was seen by the inpatient cardiology service and they have recommended that because she had a reassuring ECG and high-sensitivity troponin tests they have recommended medical management.  They uptitrated her Imdur to 30 mg daily and arrange for an outpatient Lexiscan Myoview on 08/26/2021 with subsequent cardiology office visit on 08/30/2021 with Dr. Domenic Polite.  Her LDL cholesterol was 100.  She has intolerance to all statins as reported.  She is stable to discharge home and have outpatient follow-up at this point.  For her neck pain I recommended that she take Tylenol  every 6 hours as needed for symptoms.  Patient verbalizes understanding.  Discharge Diagnoses:  Principal Problem:   Chest pain Active Problems:   Essential hypertension, benign   Seizures (HCC)   CAD (coronary artery disease), native coronary artery   Benign essential hypertension   Cervical pain (neck)   Chronic headaches   At risk for falling   Cervicogenic  headache   Discharge Instructions: Discharge Instructions     Ambulatory referral to Neurology   Complete by: As directed    An appointment is requested in approximately: 2 weeks      Allergies as of 08/24/2021       Reactions   Demerol [meperidine] Swelling   Patient stated she had a "reaction" to Demerol. Swollen lip.   Celecoxib Other (See Comments)   REACTION: hyper, couldn't eat or sleep   Codeine Itching   Oxycodone-acetaminophen Itching   Statins Other (See Comments)   Muscle aches, can not tolerate any of them per patient.          Medication List     STOP taking these medications    aluminum-magnesium hydroxide 200-200 MG/5ML suspension   diclofenac 75 MG EC tablet Commonly known as: VOLTAREN   enoxaparin 40 MG/0.4ML injection Commonly known as: LOVENOX   ferrous sulfate 325 (65 FE) MG tablet   meclizine 12.5 MG tablet Commonly known as: ANTIVERT       TAKE these medications    acetaminophen 325 MG tablet Commonly known as: TYLENOL Take 2 tablets (650 mg total) by mouth every 6 (six) hours as needed for mild pain or headache (or Fever >/= 101).   ALPRAZolam 0.5 MG tablet Commonly known as: XANAX Take 1 tablet (0.5 mg total) by mouth 2 (two) times daily as needed for anxiety or sleep.   amLODipine 10 MG tablet Commonly known as: NORVASC Take 1 tablet (10 mg total) by mouth daily.   Cyanocobalamin 2500 MCG Tabs Take 2,500 mcg by mouth daily with breakfast.   diclofenac sodium 1 % Gel Commonly known as: VOLTAREN Apply 1 application topically 2 (two) times daily as needed (joint pain). What changed: Another medication with the same name was removed. Continue taking this medication, and follow the directions you see here.   ergocalciferol 1.25 MG (50000 UT) capsule Commonly known as: VITAMIN D2 Take 1 capsule (50,000 Units total) by mouth once a week. Wednesday   esomeprazole 40 MG capsule Commonly known as: NEXIUM Take 40 mg by mouth 2  (two) times daily as needed (acid reflux/heartburn).   Fish Oil 1200 MG Caps Take 1,200 mg by mouth daily.   hydrochlorothiazide 12.5 MG capsule Commonly known as: MICROZIDE Take 12.5 mg by mouth daily with breakfast.   isosorbide mononitrate 30 MG 24 hr tablet Commonly known as: IMDUR Take 1 tablet (30 mg total) by mouth daily. What changed: how much to take   levETIRAcetam 500 MG tablet Commonly known as: KEPPRA Take 1 tablet (500 mg total) by mouth 2 (two) times daily. What changed: when to take this   levothyroxine 150 MCG tablet Commonly known as: SYNTHROID Take 150 mcg by mouth daily before breakfast.   loratadine 10 MG tablet Commonly known as: CLARITIN Take 10 mg by mouth daily as needed (seasonal allergies).   losartan 100 MG tablet Commonly known as: COZAAR Take 100 mg by mouth daily.   loteprednol 0.5 % ophthalmic suspension Commonly known as: LOTEMAX Place 2 drops into both eyes daily.   metoprolol succinate 25 MG 24  hr tablet Commonly known as: TOPROL-XL Take 0.5 tablets (12.5 mg total) by mouth daily. What changed: when to take this   nitroGLYCERIN 0.4 MG SL tablet Commonly known as: NITROSTAT Place 0.4 mg under the tongue every 5 (five) minutes x 3 doses as needed for chest pain.   polyethylene glycol 17 g packet Commonly known as: MIRALAX / GLYCOLAX Take 17 g by mouth daily as needed for mild constipation.   promethazine 25 MG tablet Commonly known as: PHENERGAN Take 12.5-25 mg by mouth every 6 (six) hours as needed for nausea or vomiting.   Restasis 0.05 % ophthalmic emulsion Generic drug: cycloSPORINE Place 1 drop into both eyes 2 (two) times daily as needed (dry eyes).   traMADol 50 MG tablet Commonly known as: ULTRAM Take 1 tablet (50 mg total) by mouth every 6 (six) hours as needed for moderate pain or severe pain.   Vitamin D-3 125 MCG (5000 UT) Tabs Take 5,000 Units by mouth daily with breakfast.        Follow-up Information      Satira Sark, MD. Go on 08/30/2021.   Specialty: Cardiology Why: at 11:40 am as scheduled Contact information: [address] [place] [address] [phone]         Caryl Bis, MD. Schedule an appointment as soon as possible for a visit in 3 day(s).   Specialty: Family Medicine Why: Hospital Follow Up, Follow up CT neck and headaches Contact information: [place] [address] [phone]         Consuella Lose, MD. Schedule an appointment as soon as possible for a visit in 2 week(s).   Specialty: Neurosurgery Why: IF NECK PAIN PERSISTS OR WORSENS Contact information: [address] [place] [address] [phone]                Allergies  Allergen Reactions   Demerol [Meperidine] Swelling    Patient stated she had a "reaction" to Demerol. Swollen lip.   Celecoxib Other (See Comments)    REACTION: hyper, couldn't eat or sleep   Codeine Itching   Oxycodone-Acetaminophen Itching   Statins Other (See Comments)    Muscle aches, can not tolerate any of them per patient.     Allergies as of 08/24/2021       Reactions   Demerol [meperidine] Swelling   Patient stated she had a "reaction" to Demerol. Swollen lip.   Celecoxib Other (See Comments)   REACTION: hyper, couldn't eat or sleep   Codeine Itching   Oxycodone-acetaminophen Itching   Statins Other (See Comments)   Muscle aches, can not tolerate any of them per patient.          Medication List     STOP taking these medications    aluminum-magnesium hydroxide 200-200 MG/5ML suspension   diclofenac 75 MG EC tablet Commonly known as: VOLTAREN   enoxaparin 40 MG/0.4ML injection Commonly known as: LOVENOX   ferrous sulfate 325 (65 FE) MG tablet   meclizine 12.5 MG tablet Commonly known as: ANTIVERT       TAKE these medications    acetaminophen 325 MG tablet Commonly known as: TYLENOL Take 2 tablets (650 mg total) by mouth every 6  (six) hours as needed for mild pain or headache (or Fever >/= 101).   ALPRAZolam 0.5 MG tablet Commonly known as: XANAX Take 1 tablet (0.5 mg total) by mouth 2 (two) times daily as needed for anxiety or sleep.  amLODipine 10 MG tablet Commonly known as: NORVASC Take 1 tablet (10 mg total) by mouth daily.   Cyanocobalamin 2500 MCG Tabs Take 2,500 mcg by mouth daily with breakfast.   diclofenac sodium 1 % Gel Commonly known as: VOLTAREN Apply 1 application topically 2 (two) times daily as needed (joint pain). What changed: Another medication with the same name was removed. Continue taking this medication, and follow the directions you see here.   ergocalciferol 1.25 MG (50000 UT) capsule Commonly known as: VITAMIN D2 Take 1 capsule (50,000 Units total) by mouth once a week. Wednesday   esomeprazole 40 MG capsule Commonly known as: NEXIUM Take 40 mg by mouth 2 (two) times daily as needed (acid reflux/heartburn).   Fish Oil 1200 MG Caps Take 1,200 mg by mouth daily.   hydrochlorothiazide 12.5 MG capsule Commonly known as: MICROZIDE Take 12.5 mg by mouth daily with breakfast.   isosorbide mononitrate 30 MG 24 hr tablet Commonly known as: IMDUR Take 1 tablet (30 mg total) by mouth daily. What changed: how much to take   levETIRAcetam 500 MG tablet Commonly known as: KEPPRA Take 1 tablet (500 mg total) by mouth 2 (two) times daily. What changed: when to take this   levothyroxine 150 MCG tablet Commonly known as: SYNTHROID Take 150 mcg by mouth daily before breakfast.   loratadine 10 MG tablet Commonly known as: CLARITIN Take 10 mg by mouth daily as needed (seasonal allergies).   losartan 100 MG tablet Commonly known as: COZAAR Take 100 mg by mouth daily.   loteprednol 0.5 % ophthalmic suspension Commonly known as: LOTEMAX Place 2 drops into both eyes daily.   metoprolol succinate 25 MG 24 hr tablet Commonly known as: TOPROL-XL Take 0.5 tablets (12.5 mg total) by  mouth daily. What changed: when to take this   nitroGLYCERIN 0.4 MG SL tablet Commonly known as: NITROSTAT Place 0.4 mg under the tongue every 5 (five) minutes x 3 doses as needed for chest pain.   polyethylene glycol 17 g packet Commonly known as: MIRALAX / GLYCOLAX Take 17 g by mouth daily as needed for mild constipation.   promethazine 25 MG tablet Commonly known as: PHENERGAN Take 12.5-25 mg by mouth every 6 (six) hours as needed for nausea or vomiting.   Restasis 0.05 % ophthalmic emulsion Generic drug: cycloSPORINE Place 1 drop into both eyes 2 (two) times daily as needed (dry eyes).   traMADol 50 MG tablet Commonly known as: ULTRAM Take 1 tablet (50 mg total) by mouth every 6 (six) hours as needed for moderate pain or severe pain.   Vitamin D-3 125 MCG (5000 UT) Tabs Take 5,000 Units by mouth daily with breakfast.        Procedures/Studies: CT Head Wo Contrast  Result Date: 08/23/2021 CLINICAL DATA:  Posterior headache and left-sided neck pain after falling backwards, hitting head on driveway last week EXAM: CT HEAD WITHOUT CONTRAST CT CERVICAL SPINE WITHOUT CONTRAST TECHNIQUE: Multidetector CT imaging of the head and cervical spine was performed following the standard protocol without intravenous contrast. Multiplanar CT image reconstructions of the cervical spine were also generated. COMPARISON:  CT CT head and CTA head and neck 01/30/2021 FINDINGS: CT HEAD FINDINGS Brain: No evidence of acute infarction, hemorrhage, hydrocephalus, extra-axial collection or mass lesion/mass effect. Vascular: No hyperdense vessel. Atherosclerotic calcifications in the intracranial carotid and vertebral arteries. Skull: Normal. Negative for fracture or focal lesion. Sinuses/Orbits: Negative.  Status post bilateral lens replacements. Other: The mastoids are well aerated. CT CERVICAL  SPINE FINDINGS Alignment: 2 mm anterolisthesis of C3 on C4, C4 on C5, and C5 on C6, which appears unchanged  compared to 01/30/2021. Retrolisthesis of C1 on C2 on the left also appears chronic. Skull base and vertebrae: No acute fracture. No primary bone lesion or focal pathologic process. Soft tissues and spinal canal: No prevertebral fluid or swelling. No visible canal hematoma. Disc levels: Multilevel degenerative disc disease without significant spinal canal stenosis. Left-greater-than-right uncovertebral and facet arthropathy, which causes severe neural foraminal narrowing on the left at C3-C4 and moderate to severe left neural foraminal narrowing at C4-C5 and C5-C6. Upper chest: Negative. Other: None IMPRESSION: 1.  No acute intracranial process. 2.  No acute fracture or traumatic listhesis in the cervical spine. Electronically Signed   By: Merilyn Baba M.D.   On: 08/23/2021 19:32   CT Cervical Spine Wo Contrast  Result Date: 08/23/2021 CLINICAL DATA:  Posterior headache and left-sided neck pain after falling backwards, hitting head on driveway last week EXAM: CT HEAD WITHOUT CONTRAST CT CERVICAL SPINE WITHOUT CONTRAST TECHNIQUE: Multidetector CT imaging of the head and cervical spine was performed following the standard protocol without intravenous contrast. Multiplanar CT image reconstructions of the cervical spine were also generated. COMPARISON:  CT CT head and CTA head and neck 01/30/2021 FINDINGS: CT HEAD FINDINGS Brain: No evidence of acute infarction, hemorrhage, hydrocephalus, extra-axial collection or mass lesion/mass effect. Vascular: No hyperdense vessel. Atherosclerotic calcifications in the intracranial carotid and vertebral arteries. Skull: Normal. Negative for fracture or focal lesion. Sinuses/Orbits: Negative.  Status post bilateral lens replacements. Other: The mastoids are well aerated. CT CERVICAL SPINE FINDINGS Alignment: 2 mm anterolisthesis of C3 on C4, C4 on C5, and C5 on C6, which appears unchanged compared to 01/30/2021. Retrolisthesis of C1 on C2 on the left also appears chronic.  Skull base and vertebrae: No acute fracture. No primary bone lesion or focal pathologic process. Soft tissues and spinal canal: No prevertebral fluid or swelling. No visible canal hematoma. Disc levels: Multilevel degenerative disc disease without significant spinal canal stenosis. Left-greater-than-right uncovertebral and facet arthropathy, which causes severe neural foraminal narrowing on the left at C3-C4 and moderate to severe left neural foraminal narrowing at C4-C5 and C5-C6. Upper chest: Negative. Other: None IMPRESSION: 1.  No acute intracranial process. 2.  No acute fracture or traumatic listhesis in the cervical spine. Electronically Signed   By: Merilyn Baba M.D.   On: 08/23/2021 19:32   DG Chest Port 1 View  Result Date: 08/23/2021 CLINICAL DATA:  Tachycardia.  Left-sided chest pain. EXAM: PORTABLE CHEST 1 VIEW COMPARISON:  Chest radiograph 01/29/2021 FINDINGS: The cardiomediastinal silhouette is unchanged with normal heart size. Aortic atherosclerosis is noted. There is chronic interstitial coarsening. Blunting of the left lateral costophrenic angle is also chronic. No sizable pleural effusion, acute airspace consolidation, overt pulmonary edema, or pneumothorax is identified. No acute osseous abnormality is seen. IMPRESSION: No active disease. Electronically Signed   By: Logan Bores M.D.   On: 08/23/2021 15:17     Subjective: Pt reports neck pain and headaches.  Chest pain has been intermitted but feels better this morning.  No shortness of breath. Agreeable to follow up with PCP, cardiology and if neck pain persists agreeable to follow up with neuro   Discharge Exam: Vitals:   08/24/21 0716 08/24/21 1059  BP: (!) 166/78 (!) 112/56  Pulse: 76 75  Resp: 20 20  Temp:  98.3 F (36.8 C)  SpO2: 93% 93%   Vitals:   08/24/21  6237 08/24/21 0504 08/24/21 0716 08/24/21 1059  BP: (!) 113/56 129/66 (!) 166/78 (!) 112/56  Pulse: 74 79 76 75  Resp: 20 (!) 21 20 20   Temp: 97.8 F (36.6 C)  98.2 F (36.8 C)  98.3 F (36.8 C)  TempSrc: Oral Oral  Oral  SpO2: 93% 92% 93% 93%  Weight:      Height:       General: Pt is alert, awake, not in acute distress Cardiovascular: RRR, S1/S2 +, no rubs, no gallops Respiratory: CTA bilaterally, no wheezing, no rhonchi Abdominal: Soft, NT, ND, bowel sounds + Extremities: no edema, no cyanosis   The results of significant diagnostics from this hospitalization (including imaging, microbiology, ancillary and laboratory) are listed below for reference.     Microbiology: Recent Results (from the past 240 hour(s))  Resp Panel by RT-PCR (Flu A&B, Covid) Nasopharyngeal Swab     Status: None   Collection Time: 08/23/21  3:48 PM   Specimen: Nasopharyngeal Swab; Nasopharyngeal(NP) swabs in vial transport medium  Result Value Ref Range Status   SARS Coronavirus 2 by RT PCR NEGATIVE NEGATIVE Final    Comment: (NOTE) SARS-CoV-2 target nucleic acids are NOT DETECTED.  The SARS-CoV-2 RNA is generally detectable in upper respiratory specimens during the acute phase of infection. The lowest concentration of SARS-CoV-2 viral copies this assay can detect is 138 copies/mL. A negative result does not preclude SARS-Cov-2 infection and should not be used as the sole basis for treatment or other patient management decisions. A negative result may occur with  improper specimen collection/handling, submission of specimen other than nasopharyngeal swab, presence of viral mutation(s) within the areas targeted by this assay, and inadequate number of viral copies(<138 copies/mL). A negative result must be combined with clinical observations, patient history, and epidemiological information. The expected result is Negative.  Fact Sheet for Patients:  EntrepreneurPulse.com.au  Fact Sheet for Healthcare Providers:  IncredibleEmployment.be  This test is no t yet approved or cleared by the [place] FDA and  has been  authorized for detection and/or diagnosis of SARS-CoV-2 by FDA under an Emergency Use Authorization (EUA). This EUA will remain  in effect (meaning this test can be used) for the duration of the COVID-19 declaration under Section 564(b)(1) of the Act, 21 U.S.C.section 360bbb-3(b)(1), unless the authorization is terminated  or revoked sooner.       Influenza A by PCR NEGATIVE NEGATIVE Final   Influenza B by PCR NEGATIVE NEGATIVE Final    Comment: (NOTE) The Xpert Xpress SARS-CoV-2/FLU/RSV plus assay is intended as an aid in the diagnosis of influenza from Nasopharyngeal swab specimens and should not be used as a sole basis for treatment. Nasal washings and aspirates are unacceptable for Xpert Xpress SARS-CoV-2/FLU/RSV testing.  Fact Sheet for Patients: EntrepreneurPulse.com.au  Fact Sheet for Healthcare Providers: IncredibleEmployment.be  This test is not yet approved or cleared by the [place] FDA and has been authorized for detection and/or diagnosis of SARS-CoV-2 by FDA under an Emergency Use Authorization (EUA). This EUA will remain in effect (meaning this test can be used) for the duration of the COVID-19 declaration under Section 564(b)(1) of the Act, 21 U.S.C. section 360bbb-3(b)(1), unless the authorization is terminated or revoked.  Performed at [facility], [address] [place], [place] [address]      Labs: BNP (last 3 results) No results for input(s): BNP in the last 8760 hours. Basic Metabolic Panel: Recent Labs  Lab 08/23/21 1454  NA 137  K 3.7  CL  101  CO2 27  GLUCOSE 111*  BUN 17  CREATININE 0.86  CALCIUM 9.0   Liver Function Tests: No results for input(s): AST, ALT, ALKPHOS, BILITOT, PROT, ALBUMIN in the last 168 hours. No results for input(s): LIPASE, AMYLASE in the last 168 hours. No results for input(s): AMMONIA in the last 168 hours. CBC: Recent Labs  Lab 08/23/21 1454  WBC 5.1  NEUTROABS 3.4   HGB 12.6  HCT 40.4  MCV 96.0  PLT 260   Cardiac Enzymes: No results for input(s): CKTOTAL, CKMB, CKMBINDEX, TROPONINI in the last 168 hours. BNP: Invalid input(s): POCBNP CBG: No results for input(s): GLUCAP in the last 168 hours. D-Dimer No results for input(s): DDIMER in the last 72 hours. Hgb A1c No results for input(s): HGBA1C in the last 72 hours. Lipid Profile No results for input(s): CHOL, HDL, LDLCALC, TRIG, CHOLHDL, LDLDIRECT in the last 72 hours. Thyroid function studies No results for input(s): TSH, T4TOTAL, T3FREE, THYROIDAB in the last 72 hours.  Invalid input(s): FREET3 Anemia work up No results for input(s): VITAMINB12, FOLATE, FERRITIN, TIBC, IRON, RETICCTPCT in the last 72 hours. Urinalysis    Component Value Date/Time   COLORURINE YELLOW 01/31/2021 1348   APPEARANCEUR HAZY (A) 01/31/2021 1348   LABSPEC 1.020 01/31/2021 1348   PHURINE 5.0 01/31/2021 1348   GLUCOSEU NEGATIVE 01/31/2021 1348   HGBUR SMALL (A) 01/31/2021 1348   BILIRUBINUR NEGATIVE 01/31/2021 1348   KETONESUR 5 (A) 01/31/2021 1348   PROTEINUR NEGATIVE 01/31/2021 1348   UROBILINOGEN 0.2 01/30/2013 1325   NITRITE NEGATIVE 01/31/2021 1348   LEUKOCYTESUR MODERATE (A) 01/31/2021 1348   Sepsis Labs Invalid input(s): PROCALCITONIN,  WBC,  LACTICIDVEN Microbiology Recent Results (from the past 240 hour(s))  Resp Panel by RT-PCR (Flu A&B, Covid) Nasopharyngeal Swab     Status: None   Collection Time: 08/23/21  3:48 PM   Specimen: Nasopharyngeal Swab; Nasopharyngeal(NP) swabs in vial transport medium  Result Value Ref Range Status   SARS Coronavirus 2 by RT PCR NEGATIVE NEGATIVE Final    Comment: (NOTE) SARS-CoV-2 target nucleic acids are NOT DETECTED.  The SARS-CoV-2 RNA is generally detectable in upper respiratory specimens during the acute phase of infection. The lowest concentration of SARS-CoV-2 viral copies this assay can detect is 138 copies/mL. A negative result does not preclude  SARS-Cov-2 infection and should not be used as the sole basis for treatment or other patient management decisions. A negative result may occur with  improper specimen collection/handling, submission of specimen other than nasopharyngeal swab, presence of viral mutation(s) within the areas targeted by this assay, and inadequate number of viral copies(<138 copies/mL). A negative result must be combined with clinical observations, patient history, and epidemiological information. The expected result is Negative.  Fact Sheet for Patients:  EntrepreneurPulse.com.au  Fact Sheet for Healthcare Providers:  IncredibleEmployment.be  This test is no t yet approved or cleared by the [place] FDA and  has been authorized for detection and/or diagnosis of SARS-CoV-2 by FDA under an Emergency Use Authorization (EUA). This EUA will remain  in effect (meaning this test can be used) for the duration of the COVID-19 declaration under Section 564(b)(1) of the Act, 21 U.S.C.section 360bbb-3(b)(1), unless the authorization is terminated  or revoked sooner.       Influenza A by PCR NEGATIVE NEGATIVE Final   Influenza B by PCR NEGATIVE NEGATIVE Final    Comment: (NOTE) The Xpert Xpress SARS-CoV-2/FLU/RSV plus assay is intended as an aid in the diagnosis of influenza from  Nasopharyngeal swab specimens and should not be used as a sole basis for treatment. Nasal washings and aspirates are unacceptable for Xpert Xpress SARS-CoV-2/FLU/RSV testing.  Fact Sheet for Patients: EntrepreneurPulse.com.au  Fact Sheet for Healthcare Providers: IncredibleEmployment.be  This test is not yet approved or cleared by the [place] FDA and has been authorized for detection and/or diagnosis of SARS-CoV-2 by FDA under an Emergency Use Authorization (EUA). This EUA will remain in effect (meaning this test can be used) for the duration of  the COVID-19 declaration under Section 564(b)(1) of the Act, 21 U.S.C. section 360bbb-3(b)(1), unless the authorization is terminated or revoked.  Performed at [facility], [address] [place],  [address]    Time coordinating discharge:   SIGNED:  Irwin Brakeman, MD  [facility] 08/24/2021, 11:05 AM How to contact the [facility] Attending or Consulting provider [place] or covering provider during after hours [place], for this patient?  Check the care team in [facility] and look for a) attending/consulting [facility] provider listed and b) the [facility] team listed Log into [email or web address] and use Outlook's universal password to access. If you do not have the password, please contact the hospital operator. Locate the [facility] provider you are looking for under [facility] and page to a number that you can be directly reached. If you still have difficulty reaching the provider, please page the [facility] (Director on Call) for the Hospitalists listed on amion for assistance.

## 2021-08-24 NOTE — Telephone Encounter (Signed)
Message from [facility], PA-C: can you set this patient up for an outpatient lexiscan? it has to be this week or next so Dr. Domenic Polite can discuss it at appt 12/19. she will probably be discharged today. Thanks     Order entered for Andrea Jackson, scheduled for 12/15, arrive 0830 main entrance [facility], NPO 6 hours before
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
## 2021-08-24 NOTE — Progress Notes (Signed)
08/24/2021 0717:  Telemetry tech called to notify RN that pt was presenting as asystole on monitor. RN and dayshift LPN go into pt rm and pt has leads disconnected d/t ambulating to [facility]. Leads are re-attached by this RN and day shift LPN. During shift change, charge RN states that pt HR is presenting in the low 200s. Lead placement is verified. Dinamap is used to compare accuracy. Vitals taken at 0716 are as follows: BP 166/78, MAP 103, HR 76. Telemetry monitor will be replaced by dayshift LPN. Pt states that she "does not feel heart racing." Pt is asymptomatic and call bell within reach.

## 2021-08-24 NOTE — Progress Notes (Signed)
Went over discharge instructions with pt. Pt verbalized understanding. Pt was discharged and taken downstairs to main entrance by staff via wheelchair.

## 2021-08-26 ENCOUNTER — Ambulatory Visit (HOSPITAL_COMMUNITY)
Admission: RE | Admit: 2021-08-26 | Discharge: 2021-08-26 | Disposition: A | Discharge status: Home / Self Care | Payer: Medicare Other | Source: Ambulatory Visit | Attending: Physician Assistant | Admitting: Physician Assistant

## 2021-08-26 ENCOUNTER — Other Ambulatory Visit: Payer: Self-pay

## 2021-08-26 ENCOUNTER — Encounter (HOSPITAL_COMMUNITY): Payer: Self-pay

## 2021-08-26 DIAGNOSIS — R079 Chest pain, unspecified: Secondary | ICD-10-CM | POA: Insufficient documentation

## 2021-08-26 LAB — NM MYOCAR MULTI W/SPECT W/WALL MOTION / EF
LV dias vol: 58 mL (ref 46–106)
LV sys vol: 22 mL
Nuc Stress EF: 63 %
Peak HR: 98 {beats}/min
RATE: 0.3
Rest HR: 73 {beats}/min
Rest Nuclear Isotope Dose: 11 mCi
SDS: 0
SSS: 2
ST Depression (mm): 0 mm
Stress Nuclear Isotope Dose: 32.7 mCi
TID: 0.95

## 2021-08-26 MED ORDER — TECHNETIUM TC 99M TETROFOSMIN IV KIT
30.0000 | PACK | Freq: Once | INTRAVENOUS | Status: AC | PRN
  Administered 2021-08-26: 32.7 via INTRAVENOUS

## 2021-08-26 MED ORDER — TECHNETIUM TC 99M TETROFOSMIN IV KIT
10.0000 | PACK | Freq: Once | INTRAVENOUS | Status: AC | PRN
  Administered 2021-08-26: 11 via INTRAVENOUS

## 2021-08-26 MED ORDER — REGADENOSON 0.4 MG/5ML IV SOLN
INTRAVENOUS | Status: AC
  Administered 2021-08-26: 0.4 mg via INTRAVENOUS
  Filled 2021-08-26: qty 5

## 2021-08-26 MED ORDER — SODIUM CHLORIDE FLUSH 0.9 % IV SOLN
INTRAVENOUS | Status: AC
  Administered 2021-08-26: 10 mL via INTRAVENOUS
  Filled 2021-08-26: qty 10

## 2021-08-30 ENCOUNTER — Ambulatory Visit (INDEPENDENT_AMBULATORY_CARE_PROVIDER_SITE_OTHER): Payer: Medicare Other | Admitting: Cardiology

## 2021-08-30 ENCOUNTER — Encounter: Payer: Self-pay | Admitting: Cardiology

## 2021-08-30 VITALS — BP 118/70 | HR 73 | Ht 60.0 in | Wt 119.0 lb

## 2021-08-30 DIAGNOSIS — I10 Essential (primary) hypertension: Secondary | ICD-10-CM

## 2021-08-30 DIAGNOSIS — I25119 Atherosclerotic heart disease of native coronary artery with unspecified angina pectoris: Secondary | ICD-10-CM | POA: Diagnosis not present

## 2021-08-30 NOTE — Patient Instructions (Addendum)
Medication Instructions:   Your physician recommends that you continue on your current medications as directed. Please refer to the Current Medication list given to you today.  Labwork:  none  Testing/Procedures:  none  Follow-Up:  Your physician recommends that you schedule a follow-up appointment in: 3 months.  Any Other Special Instructions Will Be Listed Below (If Applicable).  If you need a refill on your cardiac medications before your next appointment, please call your pharmacy. 
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
## 2021-08-31 ENCOUNTER — Other Ambulatory Visit: Payer: Self-pay | Admitting: Cardiology

## 2021-09-10 DIAGNOSIS — M810 Age-related osteoporosis without current pathological fracture: Secondary | ICD-10-CM | POA: Diagnosis not present

## 2021-09-10 DIAGNOSIS — N183 Chronic kidney disease, stage 3 unspecified: Secondary | ICD-10-CM | POA: Diagnosis not present

## 2021-09-10 DIAGNOSIS — E7849 Other hyperlipidemia: Secondary | ICD-10-CM | POA: Diagnosis not present

## 2021-09-10 DIAGNOSIS — I129 Hypertensive chronic kidney disease with stage 1 through stage 4 chronic kidney disease, or unspecified chronic kidney disease: Secondary | ICD-10-CM | POA: Diagnosis not present

## 2021-09-16 ENCOUNTER — Encounter (HOSPITAL_COMMUNITY): Payer: Self-pay | Admitting: Hematology

## 2021-09-23 ENCOUNTER — Inpatient Hospital Stay (HOSPITAL_COMMUNITY): Payer: Medicare Other | Attending: Hematology

## 2021-09-23 ENCOUNTER — Other Ambulatory Visit: Payer: Self-pay

## 2021-09-23 DIAGNOSIS — J441 Chronic obstructive pulmonary disease with (acute) exacerbation: Secondary | ICD-10-CM | POA: Diagnosis not present

## 2021-09-23 DIAGNOSIS — E7849 Other hyperlipidemia: Secondary | ICD-10-CM | POA: Diagnosis not present

## 2021-09-23 DIAGNOSIS — E538 Deficiency of other specified B group vitamins: Secondary | ICD-10-CM | POA: Diagnosis not present

## 2021-09-23 DIAGNOSIS — E039 Hypothyroidism, unspecified: Secondary | ICD-10-CM | POA: Insufficient documentation

## 2021-09-23 DIAGNOSIS — I700 Atherosclerosis of aorta: Secondary | ICD-10-CM | POA: Diagnosis not present

## 2021-09-23 DIAGNOSIS — N183 Chronic kidney disease, stage 3 unspecified: Secondary | ICD-10-CM | POA: Diagnosis not present

## 2021-09-23 DIAGNOSIS — K2100 Gastro-esophageal reflux disease with esophagitis, without bleeding: Secondary | ICD-10-CM | POA: Diagnosis not present

## 2021-09-23 DIAGNOSIS — E559 Vitamin D deficiency, unspecified: Secondary | ICD-10-CM | POA: Diagnosis not present

## 2021-09-23 DIAGNOSIS — I25110 Atherosclerotic heart disease of native coronary artery with unstable angina pectoris: Secondary | ICD-10-CM | POA: Diagnosis not present

## 2021-09-23 DIAGNOSIS — D508 Other iron deficiency anemias: Secondary | ICD-10-CM

## 2021-09-23 DIAGNOSIS — D509 Iron deficiency anemia, unspecified: Secondary | ICD-10-CM | POA: Insufficient documentation

## 2021-09-23 DIAGNOSIS — Z79899 Other long term (current) drug therapy: Secondary | ICD-10-CM | POA: Insufficient documentation

## 2021-09-23 DIAGNOSIS — I10 Essential (primary) hypertension: Secondary | ICD-10-CM | POA: Diagnosis not present

## 2021-09-23 DIAGNOSIS — Z0001 Encounter for general adult medical examination with abnormal findings: Secondary | ICD-10-CM | POA: Diagnosis not present

## 2021-09-23 DIAGNOSIS — D649 Anemia, unspecified: Secondary | ICD-10-CM | POA: Diagnosis not present

## 2021-09-23 DIAGNOSIS — R079 Chest pain, unspecified: Secondary | ICD-10-CM | POA: Diagnosis not present

## 2021-09-23 DIAGNOSIS — Z6820 Body mass index (BMI) 20.0-20.9, adult: Secondary | ICD-10-CM | POA: Diagnosis not present

## 2021-09-23 LAB — CBC WITH DIFFERENTIAL/PLATELET
Abs Immature Granulocytes: 0.01 10*3/uL (ref 0.00–0.07)
Basophils Absolute: 0 10*3/uL (ref 0.0–0.1)
Basophils Relative: 1 %
Eosinophils Absolute: 0.2 10*3/uL (ref 0.0–0.5)
Eosinophils Relative: 2 %
HCT: 38.9 % (ref 36.0–46.0)
Hemoglobin: 12.3 g/dL (ref 12.0–15.0)
Immature Granulocytes: 0 %
Lymphocytes Relative: 17 %
Lymphs Abs: 1 10*3/uL (ref 0.7–4.0)
MCH: 29.2 pg (ref 26.0–34.0)
MCHC: 31.6 g/dL (ref 30.0–36.0)
MCV: 92.4 fL (ref 80.0–100.0)
Monocytes Absolute: 0.7 10*3/uL (ref 0.1–1.0)
Monocytes Relative: 11 %
Neutro Abs: 4.3 10*3/uL (ref 1.7–7.7)
Neutrophils Relative %: 69 %
Platelets: 221 10*3/uL (ref 150–400)
RBC: 4.21 MIL/uL (ref 3.87–5.11)
RDW: 13 % (ref 11.5–15.5)
WBC: 6.2 10*3/uL (ref 4.0–10.5)
nRBC: 0 % (ref 0.0–0.2)

## 2021-09-23 LAB — FERRITIN: Ferritin: 51 ng/mL (ref 11–307)

## 2021-09-23 LAB — IRON AND TIBC
Iron: 47 ug/dL (ref 28–170)
Saturation Ratios: 15 % (ref 10.4–31.8)
TIBC: 307 ug/dL (ref 250–450)
UIBC: 260 ug/dL
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
## 2021-09-30 ENCOUNTER — Other Ambulatory Visit: Payer: Self-pay

## 2021-09-30 ENCOUNTER — Encounter (HOSPITAL_COMMUNITY): Payer: Self-pay | Admitting: Hematology

## 2021-09-30 ENCOUNTER — Inpatient Hospital Stay (HOSPITAL_BASED_OUTPATIENT_CLINIC_OR_DEPARTMENT_OTHER): Payer: Medicare Other | Admitting: Physician Assistant

## 2021-09-30 VITALS — BP 129/69 | HR 68 | Temp 97.7°F | Resp 17 | Ht 61.0 in | Wt 118.1 lb

## 2021-09-30 DIAGNOSIS — D508 Other iron deficiency anemias: Secondary | ICD-10-CM

## 2021-09-30 DIAGNOSIS — E538 Deficiency of other specified B group vitamins: Secondary | ICD-10-CM | POA: Diagnosis not present

## 2021-09-30 DIAGNOSIS — E039 Hypothyroidism, unspecified: Secondary | ICD-10-CM | POA: Diagnosis not present

## 2021-09-30 DIAGNOSIS — I10 Essential (primary) hypertension: Secondary | ICD-10-CM | POA: Diagnosis not present

## 2021-09-30 DIAGNOSIS — Z79899 Other long term (current) drug therapy: Secondary | ICD-10-CM | POA: Diagnosis not present

## 2021-09-30 DIAGNOSIS — E559 Vitamin D deficiency, unspecified: Secondary | ICD-10-CM | POA: Diagnosis not present

## 2021-09-30 DIAGNOSIS — D509 Iron deficiency anemia, unspecified: Secondary | ICD-10-CM | POA: Diagnosis not present

## 2021-09-30 NOTE — Patient Instructions (Signed)
[facility] at [facility] Discharge Instructions  You were seen today by Tarri Abernethy PA-C for your iron deficiency anemia.  Your blood levels looked great, but your iron was a bit low.  I recommend IV iron x 2 doses.    LABS: Return in 6 months for repeat labs   OTHER TESTS: None  TREATMENT: IV iron x 2 doses  FOLLOW-UP APPOINTMENT: Office visit in 6 months   Thank you for choosing [facility] at [facility] to provide your oncology and hematology care.  To afford each patient quality time with our provider, please arrive at least 15 minutes before your scheduled appointment time.   If you have a lab appointment with the [facility] please come in thru the Main Entrance and check in at the main information desk.  You need to re-schedule your appointment should you arrive 10 or more minutes late.  We strive to give you quality time with our providers, and arriving late affects you and other patients whose appointments are after yours.  Also, if you no show three or more times for appointments you may be dismissed from the clinic at the providers discretion.     Again, thank you for choosing [facility].  Our hope is that these requests will decrease the amount of time that you wait before being seen by our physicians.       _____________________________________________________________  Should you have questions after your visit to [facility], please contact our office at [phone] and follow the prompts.  Our office hours are 8:00 a.m. and 4:30 p.m. Monday - Friday.  Please note that voicemails left after 4:00 p.m. may not be returned until the following business day.  We are closed weekends and major holidays.  You do have access to a nurse 24-7, just call the main number to the clinic [phone] and do not press any options, hold on the line and a nurse will answer the phone.    For prescription refill  requests, have your pharmacy contact our office and allow 72 hours.    Due to Covid, you will need to wear a mask upon entering the hospital. If you do not have a mask, a mask will be given to you at the Main Entrance upon arrival. For doctor visits, patients may have 1 support person age 39 or older with them. For treatment visits, patients can not have anyone with them due to social distancing guidelines and our immunocompromised population.

## 2021-10-01 ENCOUNTER — Encounter (HOSPITAL_COMMUNITY): Payer: Self-pay | Admitting: Hematology

## 2021-10-04 ENCOUNTER — Other Ambulatory Visit: Payer: Self-pay

## 2021-10-04 ENCOUNTER — Encounter (HOSPITAL_COMMUNITY): Payer: Self-pay

## 2021-10-04 ENCOUNTER — Encounter (HOSPITAL_COMMUNITY): Payer: Self-pay | Admitting: Hematology

## 2021-10-04 ENCOUNTER — Inpatient Hospital Stay (HOSPITAL_COMMUNITY): Payer: Medicare Other

## 2021-10-04 VITALS — BP 138/62 | HR 55 | Temp 96.9°F | Resp 16

## 2021-10-04 DIAGNOSIS — D509 Iron deficiency anemia, unspecified: Secondary | ICD-10-CM | POA: Diagnosis not present

## 2021-10-04 DIAGNOSIS — I10 Essential (primary) hypertension: Secondary | ICD-10-CM | POA: Diagnosis not present

## 2021-10-04 DIAGNOSIS — D508 Other iron deficiency anemias: Secondary | ICD-10-CM

## 2021-10-04 DIAGNOSIS — E538 Deficiency of other specified B group vitamins: Secondary | ICD-10-CM | POA: Diagnosis not present

## 2021-10-04 DIAGNOSIS — E559 Vitamin D deficiency, unspecified: Secondary | ICD-10-CM | POA: Diagnosis not present

## 2021-10-04 DIAGNOSIS — Z79899 Other long term (current) drug therapy: Secondary | ICD-10-CM | POA: Diagnosis not present

## 2021-10-04 DIAGNOSIS — E039 Hypothyroidism, unspecified: Secondary | ICD-10-CM | POA: Diagnosis not present

## 2021-10-04 MED ORDER — SODIUM CHLORIDE 0.9 % IV SOLN
510.0000 mg | Freq: Once | INTRAVENOUS | Status: AC
  Administered 2021-10-04: 510 mg via INTRAVENOUS
  Filled 2021-10-04: qty 510

## 2021-10-04 MED ORDER — LORATADINE 10 MG PO TABS
10.0000 mg | ORAL_TABLET | Freq: Once | ORAL | Status: AC
  Administered 2021-10-04: 10 mg via ORAL
  Filled 2021-10-04: qty 1

## 2021-10-04 MED ORDER — SODIUM CHLORIDE 0.9 % IV SOLN: Freq: Once | INTRAVENOUS | Status: AC

## 2021-10-04 NOTE — Progress Notes (Signed)
Patient tolerated iron infusion with no complaints voiced.  Peripheral IV site clean and dry with good blood return noted before and after infusion.  Band aid applied.  VSS with discharge and left in satisfactory condition with no s/s of distress noted.   

## 2021-10-04 NOTE — Patient Instructions (Signed)
[facility]  Discharge Instructions: ?Thank you for choosing [facility] to provide your oncology and hematology care.  ?If you have a lab appointment with the Cancer Center, please come in thru the Main Entrance and check in at the main information desk. ? ?Wear comfortable clothing and clothing appropriate for easy access to any Portacath or PICC line.  ? ?We strive to give you quality time with your provider. You may need to reschedule your appointment if you arrive late (15 or more minutes).  Arriving late affects you and other patients whose appointments are after yours.  Also, if you miss three or more appointments without notifying the office, you may be dismissed from the clinic at the provider?s discretion.    ?  ?For prescription refill requests, have your pharmacy contact our office and allow 72 hours for refills to be completed.   ? ?Today you received the following Feraheme, return as scheduled. ?  ?To help prevent nausea and vomiting after your treatment, we encourage you to take your nausea medication as directed. ? ?BELOW ARE SYMPTOMS THAT SHOULD BE REPORTED IMMEDIATELY: ?*FEVER GREATER THAN 100.4 F (38 ?C) OR HIGHER ?*CHILLS OR SWEATING ?*NAUSEA AND VOMITING THAT IS NOT CONTROLLED WITH YOUR NAUSEA MEDICATION ?*UNUSUAL SHORTNESS OF BREATH ?*UNUSUAL BRUISING OR BLEEDING ?*URINARY PROBLEMS (pain or burning when urinating, or frequent urination) ?*BOWEL PROBLEMS (unusual diarrhea, constipation, pain near the anus) ?TENDERNESS IN MOUTH AND THROAT WITH OR WITHOUT PRESENCE OF ULCERS (sore throat, sores in mouth, or a toothache) ?UNUSUAL RASH, SWELLING OR PAIN  ?UNUSUAL VAGINAL DISCHARGE OR ITCHING  ? ?Items with * indicate a potential emergency and should be followed up as soon as possible or go to the Emergency Department if any problems should occur. ? ?Please show the CHEMOTHERAPY ALERT CARD or IMMUNOTHERAPY ALERT CARD at check-in to the Emergency Department and triage  nurse. ? ?Should you have questions after your visit or need to cancel or reschedule your appointment, please contact [facility] [phone]  and follow the prompts.  Office hours are 8:00 a.m. to 4:30 p.m. Monday - Friday. Please note that voicemails left after 4:00 p.m. may not be returned until the following business day.  We are closed weekends and major holidays. You have access to a nurse at all times for urgent questions. Please call the main number to the clinic [phone] and follow the prompts. ? ?For any non-urgent questions, you may also contact your provider using MyChart. We now offer e-Visits for anyone 18 and older to request care online for non-urgent symptoms. For details visit [email or web address]. ?  ?Also download the MyChart app! Go to the app store, search "MyChart", open the app, select [facility], and log in with your MyChart username and password. ? ?Due to Covid, a mask is required upon entering the hospital/clinic. If you do not have a mask, one will be given to you upon arrival. For doctor visits, patients may have 1 support person aged 18 or older with them. For treatment visits, patients cannot have anyone with them due to current Covid guidelines and our immunocompromised population.  ?

## 2021-10-11 ENCOUNTER — Inpatient Hospital Stay (HOSPITAL_COMMUNITY): Payer: Medicare Other

## 2021-10-11 ENCOUNTER — Other Ambulatory Visit: Payer: Self-pay

## 2021-10-11 VITALS — BP 154/69 | HR 54 | Temp 97.6°F | Resp 16 | Ht 61.0 in

## 2021-10-11 DIAGNOSIS — D509 Iron deficiency anemia, unspecified: Secondary | ICD-10-CM

## 2021-10-11 DIAGNOSIS — E039 Hypothyroidism, unspecified: Secondary | ICD-10-CM | POA: Diagnosis not present

## 2021-10-11 DIAGNOSIS — Z79899 Other long term (current) drug therapy: Secondary | ICD-10-CM | POA: Diagnosis not present

## 2021-10-11 DIAGNOSIS — D508 Other iron deficiency anemias: Secondary | ICD-10-CM

## 2021-10-11 DIAGNOSIS — E538 Deficiency of other specified B group vitamins: Secondary | ICD-10-CM | POA: Diagnosis not present

## 2021-10-11 DIAGNOSIS — E559 Vitamin D deficiency, unspecified: Secondary | ICD-10-CM | POA: Diagnosis not present

## 2021-10-11 DIAGNOSIS — I10 Essential (primary) hypertension: Secondary | ICD-10-CM | POA: Diagnosis not present

## 2021-10-11 MED ORDER — SODIUM CHLORIDE 0.9 % IV SOLN: Freq: Once | INTRAVENOUS | Status: AC

## 2021-10-11 MED ORDER — SODIUM CHLORIDE 0.9 % IV SOLN
510.0000 mg | Freq: Once | INTRAVENOUS | Status: AC
  Administered 2021-10-11: 510 mg via INTRAVENOUS
  Filled 2021-10-11: qty 510

## 2021-10-11 MED ORDER — LORATADINE 10 MG PO TABS
10.0000 mg | ORAL_TABLET | Freq: Once | ORAL | Status: AC
  Administered 2021-10-11: 10 mg via ORAL
  Filled 2021-10-11: qty 1

## 2021-10-11 NOTE — Progress Notes (Signed)
Patient presents today for iron infusion.  Patient is in satisfactory condition with no complaints voiced.  Vital signs are stable.  We will proceed with treatment per MD orders.  Patient tolerated treatment well with no complaints voiced.  Patient left ambulatory in stable condition.  Vital signs stable at discharge.  Follow up as scheduled.    

## 2021-10-11 NOTE — Patient Instructions (Signed)
[facility]  Discharge Instructions: Thank you for choosing [facility] to provide your oncology and hematology care.  If you have a lab appointment with the Cancer Center, please come in thru the Main Entrance and check in at the main information desk.  Wear comfortable clothing and clothing appropriate for easy access to any Portacath or PICC line.   We strive to give you quality time with your provider. You may need to reschedule your appointment if you arrive late (15 or more minutes).  Arriving late affects you and other patients whose appointments are after yours.  Also, if you miss three or more appointments without notifying the office, you may be dismissed from the clinic at the provider's discretion.      For prescription refill requests, have your pharmacy contact our office and allow 72 hours for refills to be completed.        To help prevent nausea and vomiting after your treatment, we encourage you to take your nausea medication as directed.  BELOW ARE SYMPTOMS THAT SHOULD BE REPORTED IMMEDIATELY: *FEVER GREATER THAN 100.4 F (38 C) OR HIGHER *CHILLS OR SWEATING *NAUSEA AND VOMITING THAT IS NOT CONTROLLED WITH YOUR NAUSEA MEDICATION *UNUSUAL SHORTNESS OF BREATH *UNUSUAL BRUISING OR BLEEDING *URINARY PROBLEMS (pain or burning when urinating, or frequent urination) *BOWEL PROBLEMS (unusual diarrhea, constipation, pain near the anus) TENDERNESS IN MOUTH AND THROAT WITH OR WITHOUT PRESENCE OF ULCERS (sore throat, sores in mouth, or a toothache) UNUSUAL RASH, SWELLING OR PAIN  UNUSUAL VAGINAL DISCHARGE OR ITCHING   Items with * indicate a potential emergency and should be followed up as soon as possible or go to the Emergency Department if any problems should occur.  Please show the CHEMOTHERAPY ALERT CARD or IMMUNOTHERAPY ALERT CARD at check-in to the Emergency Department and triage nurse.  Should you have questions after your visit or need to cancel  or reschedule your appointment, please contact [facility] [phone]  and follow the prompts.  Office hours are 8:00 a.m. to 4:30 p.m. Monday - Friday. Please note that voicemails left after 4:00 p.m. may not be returned until the following business day.  We are closed weekends and major holidays. You have access to a nurse at all times for urgent questions. Please call the main number to the clinic [phone] and follow the prompts.  For any non-urgent questions, you may also contact your provider using MyChart. We now offer e-Visits for anyone 18 and older to request care online for non-urgent symptoms. For details visit [email or web address].   Also download the MyChart app! Go to the app store, search "MyChart", open the app, select [facility], and log in with your MyChart username and password.  Due to Covid, a mask is required upon entering the hospital/clinic. If you do not have a mask, one will be given to you upon arrival. For doctor visits, patients may have 1 support person aged 18 or older with them. For treatment visits, patients cannot have anyone with them due to current Covid guidelines and our immunocompromised population.  

## 2021-10-12 ENCOUNTER — Encounter: Payer: Self-pay | Admitting: Neurology

## 2021-10-12 ENCOUNTER — Institutional Professional Consult (permissible substitution): Payer: Medicare Other | Admitting: Neurology

## 2021-11-09 DIAGNOSIS — I129 Hypertensive chronic kidney disease with stage 1 through stage 4 chronic kidney disease, or unspecified chronic kidney disease: Secondary | ICD-10-CM | POA: Diagnosis not present

## 2021-11-09 DIAGNOSIS — N183 Chronic kidney disease, stage 3 unspecified: Secondary | ICD-10-CM | POA: Diagnosis not present

## 2021-11-25 ENCOUNTER — Other Ambulatory Visit (HOSPITAL_COMMUNITY): Payer: Self-pay | Admitting: Hematology

## 2021-12-06 DIAGNOSIS — M1612 Unilateral primary osteoarthritis, left hip: Secondary | ICD-10-CM | POA: Diagnosis not present

## 2021-12-06 DIAGNOSIS — L03116 Cellulitis of left lower limb: Secondary | ICD-10-CM | POA: Diagnosis not present

## 2021-12-06 DIAGNOSIS — M7062 Trochanteric bursitis, left hip: Secondary | ICD-10-CM | POA: Diagnosis not present

## 2021-12-10 DIAGNOSIS — E7849 Other hyperlipidemia: Secondary | ICD-10-CM | POA: Diagnosis not present

## 2021-12-10 DIAGNOSIS — I129 Hypertensive chronic kidney disease with stage 1 through stage 4 chronic kidney disease, or unspecified chronic kidney disease: Secondary | ICD-10-CM | POA: Diagnosis not present
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
## 2021-12-13 ENCOUNTER — Ambulatory Visit (INDEPENDENT_AMBULATORY_CARE_PROVIDER_SITE_OTHER): Payer: Medicare Other | Admitting: Cardiology

## 2021-12-13 ENCOUNTER — Encounter: Payer: Self-pay | Admitting: Cardiology

## 2021-12-13 VITALS — BP 154/88 | HR 77 | Ht 62.0 in | Wt 117.0 lb

## 2021-12-13 DIAGNOSIS — I10 Essential (primary) hypertension: Secondary | ICD-10-CM

## 2021-12-13 DIAGNOSIS — Z01810 Encounter for preprocedural cardiovascular examination: Secondary | ICD-10-CM | POA: Diagnosis not present

## 2021-12-13 DIAGNOSIS — I25119 Atherosclerotic heart disease of native coronary artery with unspecified angina pectoris: Secondary | ICD-10-CM | POA: Diagnosis not present

## 2021-12-13 NOTE — Patient Instructions (Signed)
Follow-Up: ?Follow up with Dr. McDowell in 6 months.  ? ?Any Other Special Instructions Will Be Listed Below (If Applicable). ? ? ? ? ?If you need a refill on your cardiac medications before your next appointment, please call your pharmacy. ? ?

## 2021-12-16 DIAGNOSIS — M1612 Unilateral primary osteoarthritis, left hip: Secondary | ICD-10-CM | POA: Diagnosis not present

## 2021-12-17 ENCOUNTER — Telehealth: Payer: Self-pay

## 2021-12-17 NOTE — Telephone Encounter (Signed)
? ?  Patient Name: Andrea Jackson  ?DOB: 22-Oct-1933 ?MRN: [id number] ? ?Primary Cardiologist: Rozann Lesches, MD ? ?Chart reviewed as part of pre-operative protocol coverage.  ? ?Preoperative cardiac evaluation was addressed at most recent outpatient visit with Dr. Domenic Polite, primary cardiologist, on 12/13/2021. ? ?I will forward a copy of this note to the requesting party via epic fax function. ? ?Please call with any questions.  ? ?Lenna Sciara, NP ?12/17/2021, 3:58 PM ? ? ?

## 2021-12-17 NOTE — Telephone Encounter (Signed)
? ?  Pre-operative Risk Assessment  ?  ?Patient Name: Andrea Jackson  ?DOB: May 16, 1934 ?MRN: [id number]  ? ?  ? ?Request for Surgical Clearance   ? ?Procedure:   Left hip pinning conversion to total hip replacement ? ?Date of Surgery:  Clearance TBD                             ?   ?Surgeon:  Dr. Charlies Constable ?Surgeon's Group or Practice Name:  [facility]  ?Phone number:  [phone] ?Fax number:  [phone] ?  ?Type of Clearance Requested:   ?- Medical  ?  ?Type of Anesthesia:  Spinal ?  ?Additional requests/questions:  Please fax a copy of order and/or send any specialty labs and EKG within the last 3 months to the surgeon's office. ? ?Signed, ?Sung Amabile   ?12/17/2021, 10:19 AM   ?

## 2022-01-13 DIAGNOSIS — M1612 Unilateral primary osteoarthritis, left hip: Secondary | ICD-10-CM | POA: Diagnosis not present

## 2022-01-14 NOTE — Progress Notes (Signed)
Sent message, via epic in basket, requesting orders in epic from surgeon.  

## 2022-01-17 DIAGNOSIS — R5383 Other fatigue: Secondary | ICD-10-CM | POA: Diagnosis not present

## 2022-01-17 DIAGNOSIS — N183 Chronic kidney disease, stage 3 unspecified: Secondary | ICD-10-CM | POA: Diagnosis not present

## 2022-01-17 DIAGNOSIS — J441 Chronic obstructive pulmonary disease with (acute) exacerbation: Secondary | ICD-10-CM | POA: Diagnosis not present

## 2022-01-17 DIAGNOSIS — E039 Hypothyroidism, unspecified: Secondary | ICD-10-CM | POA: Diagnosis not present

## 2022-01-17 DIAGNOSIS — K2100 Gastro-esophageal reflux disease with esophagitis, without bleeding: Secondary | ICD-10-CM | POA: Diagnosis not present

## 2022-01-17 DIAGNOSIS — I10 Essential (primary) hypertension: Secondary | ICD-10-CM | POA: Diagnosis not present

## 2022-01-17 DIAGNOSIS — E7849 Other hyperlipidemia: Secondary | ICD-10-CM | POA: Diagnosis not present

## 2022-01-19 ENCOUNTER — Other Ambulatory Visit (HOSPITAL_COMMUNITY): Payer: Self-pay

## 2022-01-19 DIAGNOSIS — I10 Essential (primary) hypertension: Secondary | ICD-10-CM | POA: Diagnosis not present

## 2022-01-19 DIAGNOSIS — I700 Atherosclerosis of aorta: Secondary | ICD-10-CM | POA: Diagnosis not present

## 2022-01-19 DIAGNOSIS — N1831 Chronic kidney disease, stage 3a: Secondary | ICD-10-CM | POA: Diagnosis not present

## 2022-01-19 DIAGNOSIS — M810 Age-related osteoporosis without current pathological fracture: Secondary | ICD-10-CM | POA: Diagnosis not present

## 2022-01-19 DIAGNOSIS — E7849 Other hyperlipidemia: Secondary | ICD-10-CM | POA: Diagnosis not present

## 2022-01-19 DIAGNOSIS — R4582 Worries: Secondary | ICD-10-CM | POA: Diagnosis not present

## 2022-01-19 DIAGNOSIS — I25119 Atherosclerotic heart disease of native coronary artery with unspecified angina pectoris: Secondary | ICD-10-CM | POA: Diagnosis not present

## 2022-01-19 DIAGNOSIS — Z0001 Encounter for general adult medical examination with abnormal findings: Secondary | ICD-10-CM | POA: Diagnosis not present

## 2022-01-20 ENCOUNTER — Ambulatory Visit: Payer: Self-pay | Admitting: Physician Assistant

## 2022-01-20 DIAGNOSIS — G8929 Other chronic pain: Secondary | ICD-10-CM

## 2022-01-20 NOTE — Patient Instructions (Addendum)
DUE TO COVID-19 ONLY TWO VISITORS  (aged 86 and older)  ARE ALLOWED TO COME WITH YOU AND STAY IN THE WAITING ROOM ONLY DURING PRE OP AND PROCEDURE.   ?**NO VISITORS ARE ALLOWED IN THE SHORT STAY AREA OR RECOVERY ROOM!!** ? ?IF YOU WILL BE ADMITTED INTO THE HOSPITAL YOU ARE ALLOWED ONLY FOUR SUPPORT PEOPLE DURING VISITATION HOURS ONLY (7 AM -8PM)   ?The support person(s) must pass our screening, gel in and out, and wear a mask at all times, including in the patient?s room. ?Patients must also wear a mask when staff or their support person are in the room. ?Visitors GUEST BADGE MUST BE WORN VISIBLY  ?One adult visitor may remain with you overnight and MUST be in the room by 8 P.M. ?  ? ? Your procedure is scheduled on: 02/02/22 ? ? Report to [facility] Main Entrance ? ?  Report to admitting at 10:30 AM ? ? Call this number if you have problems the morning of surgery [phone] ? ? Do not eat food :After Midnight. ? ? After Midnight you may have the following liquids until 10:15 AM DAY OF SURGERY ? ?Water ?Black Coffee (sugar ok, NO MILK/CREAM OR CREAMERS)  ?Tea (sugar ok, NO MILK/CREAM OR CREAMERS) regular and decaf                             ?Plain Jell-O (NO RED)                                           ?Fruit ices (not with fruit pulp, NO RED)                                     ?Popsicles (NO RED)                                                                  ?Juice: apple, WHITE grape, WHITE cranberry ?Sports drinks like Gatorade (NO RED) ?Clear broth(vegetable,chicken,beef) ? ?              ?The day of surgery:  ?Drink ONE (1) Pre-Surgery Clear Ensure at 10:15 AM the morning of surgery. Drink in one sitting. Do not sip.  ?This drink was given to you during your hospital  ?pre-op appointment visit. ?Nothing else to drink after completing the  ?Pre-Surgery Clear Ensure. ?  ?       If you have questions, please contact your surgeon?s office. ? ? ?FOLLOW BOWEL PREP AND ANY ADDITIONAL PRE OP  INSTRUCTIONS YOU RECEIVED FROM YOUR SURGEON'S OFFICE!!! ?  ?  ?Oral Hygiene is also important to reduce your risk of infection.                                    ?Remember - BRUSH YOUR TEETH THE MORNING OF SURGERY WITH YOUR REGULAR TOOTHPASTE ? ? Take these medicines the morning of surgery with A SIP OF WATER: Tylenol, Xanax,  Nexium, Isosorbide, Levothyroxine, Claritin, Metoprolol, Tramadol. ?                  ?           You may not have any metal on your body including hair pins, jewelry, and body piercing ? ?           Do not wear make-up, lotions, powders, perfumes, or deodorant ? ?Do not wear nail polish including gel and S&S, artificial/acrylic nails, or any other type of covering on natural nails including finger and toenails. If you have artificial nails, gel coating, etc. that needs to be removed by a nail salon please have this removed prior to surgery or surgery may need to be canceled/ delayed if the surgeon/ anesthesia feels like they are unable to be safely monitored.  ? ?Do not shave  48 hours prior to surgery.  ? ? Do not bring valuables to the hospital. [facility] NOT ?            RESPONSIBLE   FOR VALUABLES. ? ? Contacts, dentures or bridgework may not be worn into surgery. ? ? Bring small overnight bag day of surgery. ? ?            Please read over the following fact sheets you were given: [facility] [place] [phone]- Apolonio Schneiders ? ?   [place] - Preparing for Surgery ?Before surgery, you can play an important role.  Because skin is not sterile, your skin needs to be as free of germs as possible.  You can reduce the number of germs on your skin by washing with CHG (chlorahexidine gluconate) soap before surgery.  CHG is an antiseptic cleaner which kills germs and bonds with the skin to continue killing germs even after washing. ?Please DO NOT use if you have an allergy to CHG or antibacterial soaps.  If your skin becomes reddened/irritated stop  using the CHG and inform your nurse when you arrive at Short Stay. ?Do not shave (including legs and underarms) for at least 48 hours prior to the first CHG shower.  You may shave your face/neck. ? ?Please follow these instructions carefully: ? 1.  Shower with CHG Soap the night before surgery and the  morning of surgery. ? 2.  If you choose to wash your hair, wash your hair first as usual with your normal  shampoo. ? 3.  After you shampoo, rinse your hair and body thoroughly to remove the shampoo.                            ? 4.  Use CHG as you would any other liquid soap.  You can apply chg directly to the skin and wash.  Gently with a scrungie or clean washcloth. ? 5.  Apply the CHG Soap to your body ONLY FROM THE NECK DOWN.   Do   not use on face/ open      ?                     Wound or open sores. Avoid contact with eyes, ears mouth and   genitals (private parts).  ?                     Production manager,  Genitals (private parts) with your normal soap. ?  6.  Wash thoroughly, paying special attention to the area where your    surgery  will be performed. ? 7.  Thoroughly rinse your body with warm water from the neck down. ? 8.  DO NOT shower/wash with your normal soap after using and rinsing off the CHG Soap. ?               9.  Pat yourself dry with a clean towel. ?           10.  Wear clean pajamas. ?           11.  Place clean sheets on your bed the night of your first shower and do not  sleep with pets. ?Day of Surgery : ?Do not apply any lotions/deodorants the morning of surgery.  Please wear clean clothes to the hospital/surgery center. ? ?FAILURE TO FOLLOW THESE INSTRUCTIONS MAY RESULT IN THE CANCELLATION OF YOUR SURGERY ? ?PATIENT SIGNATURE_________________________________ ? ?NURSE SIGNATURE__________________________________ ? ?________________________________________________________________________  ? ? ?Incentive Spirometer ? ?An incentive spirometer is a tool that can help keep your lungs clear  and active. This tool measures how well you are filling your lungs with each breath. Taking long deep breaths may help reverse or decrease the chance of developing breathing (pulmonary) problems (especially infection) following: ?A long period of time when you are unable to move or be active. ?BEFORE THE PROCEDURE  ?If the spirometer includes an indicator to show your best effort, your nurse or respiratory therapist will set it to a desired goal. ?If possible, sit up straight or lean slightly forward. Try not to slouch. ?Hold the incentive spirometer in an upright position. ?INSTRUCTIONS FOR USE  ?Sit on the edge of your bed if possible, or sit up as far as you can in bed or on a chair. ?Hold the incentive spirometer in an upright position. ?Breathe out normally. ?Place the mouthpiece in your mouth and seal your lips tightly around it. ?Breathe in slowly and as deeply as possible, raising the piston or the ball toward the top of the column. ?Hold your breath for 3-5 seconds or for as long as possible. Allow the piston or ball to fall to the bottom of the column. ?Remove the mouthpiece from your mouth and breathe out normally. ?Rest for a few seconds and repeat Steps 1 through 7 at least 10 times every 1-2 hours when you are awake. Take your time and take a few normal breaths between deep breaths. ?The spirometer may include an indicator to show your best effort. Use the indicator as a goal to work toward during each repetition. ?After each set of 10 deep breaths, practice coughing to be sure your lungs are clear. If you have an incision (the cut made at the time of surgery), support your incision when coughing by placing a pillow or rolled up towels firmly against it. ?Once you are able to get out of bed, walk around indoors and cough well. You may stop using the incentive spirometer when instructed by your caregiver.  ?RISKS AND COMPLICATIONS ?Take your time so you do not get dizzy or light-headed. ?If you are in  pain, you may need to take or ask for pain medication before doing incentive spirometry. It is harder to take a deep breath if you are having pain. ?AFTER USE ?Rest and breathe slowly and easily. ?It can be helpfu

## 2022-01-20 NOTE — Progress Notes (Addendum)
COVID Vaccine Completed: yes x3 ? ?Date of COVID positive in last 90 days: no ? ?PCP - Gar Ponto, MD ?Cardiologist - Rozann Lesches, MD ? ?Cardiac clearance by Rozann Lesches 12/13/21 in Epic ? ?Chest x-ray - 08/23/21 Epic ?EKG - 08/24/21 Epic ?Stress Test - 08/26/21 Epic ?ECHO - 01/31/21 Epic ?Cardiac Cath - 2010 ?Pacemaker/ICD device last checked: n/a ?Spinal Cord Stimulator:n/a ? ?Bowel Prep - no ? ?Sleep Study - n/a ?CPAP -  ? ?Fasting Blood Sugar - n/a ?Checks Blood Sugar _____ times a day ? ?Blood Thinner Instructions: n/a ?Aspirin Instructions: ?Last Dose: ? ?Activity level: Can go up a flight of stairs and perform activities of daily living without stopping and without symptoms of chest pain or shortness of breath. ?   ? ?Anesthesia review: HTN, CAD, dysphagia, anemia, MI, seizures  ? ?Patient denies shortness of breath, fever, cough and chest pain at PAT appointment ? ? ?Patient verbalized understanding of instructions that were given to them at the PAT appointment. Patient was also instructed that they will need to review over the PAT instructions again at home before surgery.  ?

## 2022-01-21 ENCOUNTER — Encounter (HOSPITAL_COMMUNITY): Payer: Self-pay

## 2022-01-21 ENCOUNTER — Encounter (HOSPITAL_COMMUNITY)
Admission: RE | Admit: 2022-01-21 | Discharge: 2022-01-21 | Disposition: A | Discharge status: Home / Self Care | Payer: Medicare Other | Source: Ambulatory Visit | Attending: Orthopedic Surgery | Admitting: Orthopedic Surgery

## 2022-01-21 VITALS — BP 134/86 | HR 60 | Temp 97.8°F | Resp 14 | Ht 62.0 in | Wt 117.3 lb

## 2022-01-21 DIAGNOSIS — T8484XA Pain due to internal orthopedic prosthetic devices, implants and grafts, initial encounter: Secondary | ICD-10-CM | POA: Insufficient documentation

## 2022-01-21 DIAGNOSIS — Z9861 Coronary angioplasty status: Secondary | ICD-10-CM | POA: Insufficient documentation

## 2022-01-21 DIAGNOSIS — K219 Gastro-esophageal reflux disease without esophagitis: Secondary | ICD-10-CM | POA: Diagnosis not present

## 2022-01-21 DIAGNOSIS — I2510 Atherosclerotic heart disease of native coronary artery without angina pectoris: Secondary | ICD-10-CM | POA: Insufficient documentation

## 2022-01-21 DIAGNOSIS — I10 Essential (primary) hypertension: Secondary | ICD-10-CM | POA: Insufficient documentation

## 2022-01-21 DIAGNOSIS — G8929 Other chronic pain: Secondary | ICD-10-CM

## 2022-01-21 DIAGNOSIS — Z01812 Encounter for preprocedural laboratory examination: Secondary | ICD-10-CM | POA: Diagnosis not present

## 2022-01-21 DIAGNOSIS — Z01818 Encounter for other preprocedural examination: Secondary | ICD-10-CM

## 2022-01-21 DIAGNOSIS — I25119 Atherosclerotic heart disease of native coronary artery with unspecified angina pectoris: Secondary | ICD-10-CM

## 2022-01-21 LAB — CBC WITH DIFFERENTIAL/PLATELET
Abs Immature Granulocytes: 0.01 10*3/uL (ref 0.00–0.07)
Basophils Absolute: 0 10*3/uL (ref 0.0–0.1)
Basophils Relative: 1 %
Eosinophils Absolute: 0.1 10*3/uL (ref 0.0–0.5)
Eosinophils Relative: 2 %
HCT: 45.7 % (ref 36.0–46.0)
Hemoglobin: 14.3 g/dL (ref 12.0–15.0)
Immature Granulocytes: 0 %
Lymphocytes Relative: 15 %
Lymphs Abs: 0.9 10*3/uL (ref 0.7–4.0)
MCH: 30.2 pg (ref 26.0–34.0)
MCHC: 31.3 g/dL (ref 30.0–36.0)
MCV: 96.4 fL (ref 80.0–100.0)
Monocytes Absolute: 0.5 10*3/uL (ref 0.1–1.0)
Monocytes Relative: 9 %
Neutro Abs: 4.2 10*3/uL (ref 1.7–7.7)
Neutrophils Relative %: 73 %
Platelets: 218 10*3/uL (ref 150–400)
RBC: 4.74 MIL/uL (ref 3.87–5.11)
RDW: 13.2 % (ref 11.5–15.5)
WBC: 5.7 10*3/uL (ref 4.0–10.5)
nRBC: 0 % (ref 0.0–0.2)

## 2022-01-21 LAB — COMPREHENSIVE METABOLIC PANEL
ALT: 20 U/L (ref 0–44)
AST: 22 U/L (ref 15–41)
Albumin: 4 g/dL (ref 3.5–5.0)
Alkaline Phosphatase: 51 U/L (ref 38–126)
Anion gap: 8 (ref 5–15)
BUN: 20 mg/dL (ref 8–23)
CO2: 29 mmol/L (ref 22–32)
Calcium: 9.3 mg/dL (ref 8.9–10.3)
Chloride: 102 mmol/L (ref 98–111)
Creatinine, Ser: 0.95 mg/dL (ref 0.44–1.00)
GFR, Estimated: 59 mL/min — ABNORMAL LOW (ref >60)
Glucose, Bld: 94 mg/dL (ref 70–99)
Potassium: 4.5 mmol/L (ref 3.5–5.1)
Sodium: 139 mmol/L (ref 135–145)
Total Bilirubin: 0.8 mg/dL (ref 0.3–1.2)
Total Protein: 6.7 g/dL (ref 6.5–8.1)

## 2022-01-21 LAB — SURGICAL PCR SCREEN
MRSA, PCR: NEGATIVE
Staphylococcus aureus: NEGATIVE

## 2022-01-24 ENCOUNTER — Ambulatory Visit: Payer: Self-pay | Admitting: Physician Assistant
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
## 2022-01-27 DIAGNOSIS — M1612 Unilateral primary osteoarthritis, left hip: Secondary | ICD-10-CM | POA: Diagnosis not present

## 2022-01-27 DIAGNOSIS — M25552 Pain in left hip: Secondary | ICD-10-CM | POA: Diagnosis not present

## 2022-02-01 MED ORDER — TRANEXAMIC ACID 1000 MG/10ML IV SOLN
2000.0000 mg | INTRAVENOUS | Status: AC
  Filled 2022-02-01: qty 20

## 2022-02-02 ENCOUNTER — Inpatient Hospital Stay (HOSPITAL_COMMUNITY): Payer: Medicare Other

## 2022-02-02 ENCOUNTER — Other Ambulatory Visit: Payer: Self-pay

## 2022-02-02 ENCOUNTER — Inpatient Hospital Stay (HOSPITAL_COMMUNITY): Payer: Medicare Other | Admitting: Anesthesiology

## 2022-02-02 ENCOUNTER — Encounter (HOSPITAL_COMMUNITY)
Admission: RE | Disposition: A | Discharge status: Home Health | Payer: Self-pay | Source: Home / Self Care | Attending: Orthopedic Surgery

## 2022-02-02 ENCOUNTER — Inpatient Hospital Stay (HOSPITAL_COMMUNITY): Payer: Medicare Other | Admitting: Physician Assistant

## 2022-02-02 ENCOUNTER — Inpatient Hospital Stay (HOSPITAL_COMMUNITY)
Admission: RE | Admit: 2022-02-02 | Discharge: 2022-02-06 | DRG: 522 | Disposition: A | Discharge status: Home Health | Payer: Medicare Other | Attending: Orthopedic Surgery | Admitting: Orthopedic Surgery

## 2022-02-02 ENCOUNTER — Encounter (HOSPITAL_COMMUNITY): Payer: Self-pay | Admitting: Orthopedic Surgery

## 2022-02-02 DIAGNOSIS — I252 Old myocardial infarction: Secondary | ICD-10-CM

## 2022-02-02 DIAGNOSIS — M1612 Unilateral primary osteoarthritis, left hip: Secondary | ICD-10-CM | POA: Diagnosis present

## 2022-02-02 DIAGNOSIS — M24852 Other specific joint derangements of left hip, not elsewhere classified: Secondary | ICD-10-CM | POA: Diagnosis not present

## 2022-02-02 DIAGNOSIS — X58XXXD Exposure to other specified factors, subsequent encounter: Secondary | ICD-10-CM | POA: Diagnosis present

## 2022-02-02 DIAGNOSIS — E039 Hypothyroidism, unspecified: Secondary | ICD-10-CM | POA: Diagnosis present

## 2022-02-02 DIAGNOSIS — K219 Gastro-esophageal reflux disease without esophagitis: Secondary | ICD-10-CM | POA: Diagnosis present

## 2022-02-02 DIAGNOSIS — Z471 Aftercare following joint replacement surgery: Secondary | ICD-10-CM | POA: Diagnosis not present

## 2022-02-02 DIAGNOSIS — S72002P Fracture of unspecified part of neck of left femur, subsequent encounter for closed fracture with malunion: Secondary | ICD-10-CM | POA: Diagnosis not present

## 2022-02-02 DIAGNOSIS — Z79890 Hormone replacement therapy (postmenopausal): Secondary | ICD-10-CM | POA: Diagnosis not present

## 2022-02-02 DIAGNOSIS — Z885 Allergy status to narcotic agent status: Secondary | ICD-10-CM

## 2022-02-02 DIAGNOSIS — Z85828 Personal history of other malignant neoplasm of skin: Secondary | ICD-10-CM

## 2022-02-02 DIAGNOSIS — Z886 Allergy status to analgesic agent status: Secondary | ICD-10-CM

## 2022-02-02 DIAGNOSIS — T84418A Breakdown (mechanical) of other internal orthopedic devices, implants and grafts, initial encounter: Secondary | ICD-10-CM | POA: Diagnosis not present

## 2022-02-02 DIAGNOSIS — Z888 Allergy status to other drugs, medicaments and biological substances status: Secondary | ICD-10-CM

## 2022-02-02 DIAGNOSIS — I2510 Atherosclerotic heart disease of native coronary artery without angina pectoris: Secondary | ICD-10-CM

## 2022-02-02 DIAGNOSIS — I10 Essential (primary) hypertension: Secondary | ICD-10-CM | POA: Diagnosis not present

## 2022-02-02 DIAGNOSIS — Z01818 Encounter for other preprocedural examination: Principal | ICD-10-CM

## 2022-02-02 DIAGNOSIS — Z79899 Other long term (current) drug therapy: Secondary | ICD-10-CM | POA: Diagnosis not present

## 2022-02-02 DIAGNOSIS — Z96642 Presence of left artificial hip joint: Secondary | ICD-10-CM | POA: Diagnosis not present

## 2022-02-02 DIAGNOSIS — S72042K Displaced fracture of base of neck of left femur, subsequent encounter for closed fracture with nonunion: Secondary | ICD-10-CM | POA: Diagnosis not present

## 2022-02-02 DIAGNOSIS — E782 Mixed hyperlipidemia: Secondary | ICD-10-CM | POA: Diagnosis present

## 2022-02-02 DIAGNOSIS — F419 Anxiety disorder, unspecified: Secondary | ICD-10-CM | POA: Diagnosis present

## 2022-02-02 HISTORY — PX: CONVERSION TO TOTAL HIP: SHX5784

## 2022-02-02 LAB — TYPE AND SCREEN
ABO/RH(D): A POS
Antibody Screen: POSITIVE
Unit division: 0
Unit division: 0

## 2022-02-02 LAB — BPAM RBC
Blood Product Expiration Date: 202306042359
Blood Product Expiration Date: 202306042359
Unit Type and Rh: 6200
Unit Type and Rh: 6200

## 2022-02-02 SURGERY — CONVERSION, PREVIOUS HIP SURGERY, TO TOTAL HIP ARTHROPLASTY
Anesthesia: General | Site: Hip | Laterality: Left

## 2022-02-02 MED ORDER — PHENYLEPHRINE HCL (PRESSORS) 10 MG/ML IV SOLN
INTRAVENOUS | Status: AC
  Filled 2022-02-02: qty 1

## 2022-02-02 MED ORDER — MENTHOL 3 MG MT LOZG: 1.0000 | LOZENGE | OROMUCOSAL | Status: DC | PRN

## 2022-02-02 MED ORDER — METOPROLOL SUCCINATE ER 25 MG PO TB24
25.0000 mg | ORAL_TABLET | Freq: Every day | ORAL | Status: DC
  Administered 2022-02-03 – 2022-02-06 (×4): 25 mg via ORAL
  Filled 2022-02-02 (×4): qty 1

## 2022-02-02 MED ORDER — BUPIVACAINE LIPOSOME 1.3 % IJ SUSP
INTRAMUSCULAR | Status: AC
  Filled 2022-02-02: qty 20

## 2022-02-02 MED ORDER — EPHEDRINE 5 MG/ML INJ
INTRAVENOUS | Status: AC
  Filled 2022-02-02: qty 5

## 2022-02-02 MED ORDER — ACETAMINOPHEN 500 MG PO TABS: 1000.0000 mg | ORAL_TABLET | Freq: Once | ORAL | Status: DC

## 2022-02-02 MED ORDER — LOSARTAN POTASSIUM 50 MG PO TABS
100.0000 mg | ORAL_TABLET | Freq: Every day | ORAL | Status: DC
  Administered 2022-02-03 – 2022-02-06 (×3): 100 mg via ORAL
  Filled 2022-02-02 (×3): qty 2

## 2022-02-02 MED ORDER — FENTANYL CITRATE (PF) 250 MCG/5ML IJ SOLN
INTRAMUSCULAR | Status: AC
  Filled 2022-02-02: qty 5

## 2022-02-02 MED ORDER — SODIUM CHLORIDE (PF) 0.9 % IJ SOLN
INTRAMUSCULAR | Status: AC
  Filled 2022-02-02: qty 10

## 2022-02-02 MED ORDER — SODIUM CHLORIDE 0.9 % IR SOLN
Status: DC | PRN
  Administered 2022-02-02: 3000 mL
  Administered 2022-02-02: 1000 mL

## 2022-02-02 MED ORDER — DOCUSATE SODIUM 100 MG PO CAPS
100.0000 mg | ORAL_CAPSULE | Freq: Two times a day (BID) | ORAL | Status: DC
  Administered 2022-02-02 – 2022-02-06 (×8): 100 mg via ORAL
  Filled 2022-02-02 (×8): qty 1

## 2022-02-02 MED ORDER — DEXAMETHASONE SODIUM PHOSPHATE 10 MG/ML IJ SOLN
INTRAMUSCULAR | Status: DC | PRN
  Administered 2022-02-02: 5 mg via INTRAVENOUS

## 2022-02-02 MED ORDER — LACTATED RINGERS IV SOLN
INTRAVENOUS | Status: DC
Start: 2022-02-02 — End: 2022-02-02

## 2022-02-02 MED ORDER — ALPRAZOLAM 0.25 MG PO TABS
0.2500 mg | ORAL_TABLET | Freq: Every evening | ORAL | Status: DC | PRN
  Administered 2022-02-04 – 2022-02-05 (×2): 0.25 mg via ORAL
  Filled 2022-02-02 (×2): qty 1

## 2022-02-02 MED ORDER — ORAL CARE MOUTH RINSE: 15.0000 mL | Freq: Once | OROMUCOSAL | Status: AC

## 2022-02-02 MED ORDER — ACETAMINOPHEN 10 MG/ML IV SOLN
INTRAVENOUS | Status: AC
  Filled 2022-02-02: qty 100

## 2022-02-02 MED ORDER — ACETAMINOPHEN 500 MG PO TABS
1000.0000 mg | ORAL_TABLET | Freq: Four times a day (QID) | ORAL | Status: AC
  Administered 2022-02-02 – 2022-02-03 (×2): 1000 mg via ORAL
  Filled 2022-02-02 (×3): qty 2

## 2022-02-02 MED ORDER — ROCURONIUM BROMIDE 10 MG/ML (PF) SYRINGE
PREFILLED_SYRINGE | INTRAVENOUS | Status: DC | PRN
  Administered 2022-02-02: 30 mg via INTRAVENOUS

## 2022-02-02 MED ORDER — WATER FOR IRRIGATION, STERILE IR SOLN
Status: DC | PRN
  Administered 2022-02-02: 2000 mL

## 2022-02-02 MED ORDER — EPHEDRINE SULFATE-NACL 50-0.9 MG/10ML-% IV SOSY
PREFILLED_SYRINGE | INTRAVENOUS | Status: DC | PRN
  Administered 2022-02-02 (×3): 5 mg via INTRAVENOUS

## 2022-02-02 MED ORDER — POLYETHYLENE GLYCOL 3350 17 G PO PACK: 17.0000 g | PACK | Freq: Every day | ORAL | Status: DC | PRN

## 2022-02-02 MED ORDER — ONDANSETRON HCL 4 MG/2ML IJ SOLN
INTRAMUSCULAR | Status: DC | PRN
  Administered 2022-02-02: 4 mg via INTRAVENOUS

## 2022-02-02 MED ORDER — LIDOCAINE 2% (20 MG/ML) 5 ML SYRINGE
INTRAMUSCULAR | Status: DC | PRN
  Administered 2022-02-02: 40 mg via INTRAVENOUS

## 2022-02-02 MED ORDER — ROCURONIUM BROMIDE 10 MG/ML (PF) SYRINGE
PREFILLED_SYRINGE | INTRAVENOUS | Status: AC
  Filled 2022-02-02: qty 10

## 2022-02-02 MED ORDER — ISOSORBIDE MONONITRATE ER 30 MG PO TB24
30.0000 mg | ORAL_TABLET | Freq: Every day | ORAL | Status: DC
  Administered 2022-02-03 – 2022-02-06 (×4): 30 mg via ORAL
  Filled 2022-02-02 (×4): qty 1

## 2022-02-02 MED ORDER — ONDANSETRON HCL 4 MG/2ML IJ SOLN
4.0000 mg | Freq: Four times a day (QID) | INTRAMUSCULAR | Status: DC | PRN
  Administered 2022-02-03: 4 mg via INTRAVENOUS
  Filled 2022-02-02: qty 2

## 2022-02-02 MED ORDER — FENTANYL CITRATE PF 50 MCG/ML IJ SOSY
25.0000 ug | PREFILLED_SYRINGE | INTRAMUSCULAR | Status: DC | PRN
  Administered 2022-02-02 (×2): 25 ug via INTRAVENOUS

## 2022-02-02 MED ORDER — ACETAMINOPHEN 10 MG/ML IV SOLN
INTRAVENOUS | Status: DC | PRN
  Administered 2022-02-02: 1000 mg via INTRAVENOUS

## 2022-02-02 MED ORDER — LACTATED RINGERS IV SOLN: INTRAVENOUS | Status: DC

## 2022-02-02 MED ORDER — ONDANSETRON HCL 4 MG/2ML IJ SOLN: 4.0000 mg | Freq: Once | INTRAMUSCULAR | Status: DC | PRN

## 2022-02-02 MED ORDER — SODIUM CHLORIDE 0.9 % IV SOLN: INTRAVENOUS | Status: DC

## 2022-02-02 MED ORDER — DEXAMETHASONE SODIUM PHOSPHATE 10 MG/ML IJ SOLN: 8.0000 mg | Freq: Once | INTRAMUSCULAR | Status: AC

## 2022-02-02 MED ORDER — LEVOTHYROXINE SODIUM 75 MCG PO TABS
150.0000 ug | ORAL_TABLET | Freq: Every day | ORAL | Status: DC
  Administered 2022-02-03 – 2022-02-06 (×4): 150 ug via ORAL
  Filled 2022-02-02 (×4): qty 2

## 2022-02-02 MED ORDER — CEFAZOLIN SODIUM-DEXTROSE 2-4 GM/100ML-% IV SOLN
2.0000 g | INTRAVENOUS | Status: AC
  Administered 2022-02-02: 2 g via INTRAVENOUS
  Filled 2022-02-02: qty 100

## 2022-02-02 MED ORDER — FENTANYL CITRATE PF 50 MCG/ML IJ SOSY
PREFILLED_SYRINGE | INTRAMUSCULAR | Status: AC
  Administered 2022-02-02: 50 ug via INTRAVENOUS
  Filled 2022-02-02: qty 1

## 2022-02-02 MED ORDER — PHENYLEPHRINE HCL-NACL 20-0.9 MG/250ML-% IV SOLN
INTRAVENOUS | Status: DC | PRN
  Administered 2022-02-02: 50 ug/min via INTRAVENOUS

## 2022-02-02 MED ORDER — BUPIVACAINE LIPOSOME 1.3 % IJ SUSP
INTRAMUSCULAR | Status: DC | PRN
  Administered 2022-02-02: 20 mL

## 2022-02-02 MED ORDER — TRANEXAMIC ACID-NACL 1000-0.7 MG/100ML-% IV SOLN
1000.0000 mg | INTRAVENOUS | Status: AC
  Administered 2022-02-02: 1000 mg via INTRAVENOUS
  Filled 2022-02-02: qty 100

## 2022-02-02 MED ORDER — METHOCARBAMOL 500 MG IVPB - SIMPLE MED
INTRAVENOUS | Status: AC
  Filled 2022-02-02: qty 50

## 2022-02-02 MED ORDER — MIDAZOLAM HCL 2 MG/2ML IJ SOLN
INTRAMUSCULAR | Status: DC | PRN
  Administered 2022-02-02: .5 mg via INTRAVENOUS

## 2022-02-02 MED ORDER — POVIDONE-IODINE 10 % EX SWAB
2.0000 "application " | Freq: Once | CUTANEOUS | Status: AC
  Administered 2022-02-02: 2 via TOPICAL

## 2022-02-02 MED ORDER — METHOCARBAMOL 500 MG PO TABS
500.0000 mg | ORAL_TABLET | Freq: Four times a day (QID) | ORAL | Status: DC | PRN
  Administered 2022-02-02 – 2022-02-03 (×2): 500 mg via ORAL
  Filled 2022-02-02 (×3): qty 1

## 2022-02-02 MED ORDER — CEFAZOLIN SODIUM-DEXTROSE 2-4 GM/100ML-% IV SOLN
2.0000 g | Freq: Four times a day (QID) | INTRAVENOUS | Status: AC
  Administered 2022-02-02 (×2): 2 g via INTRAVENOUS
  Filled 2022-02-02 (×2): qty 100

## 2022-02-02 MED ORDER — VITAMIN D 25 MCG (1000 UNIT) PO TABS
5000.0000 [IU] | ORAL_TABLET | Freq: Every day | ORAL | Status: DC
  Administered 2022-02-04 – 2022-02-06 (×3): 5000 [IU] via ORAL
  Filled 2022-02-02 (×4): qty 5

## 2022-02-02 MED ORDER — PROPOFOL 10 MG/ML IV BOLUS
INTRAVENOUS | Status: AC
  Filled 2022-02-02: qty 20

## 2022-02-02 MED ORDER — DEXAMETHASONE SODIUM PHOSPHATE 10 MG/ML IJ SOLN
INTRAMUSCULAR | Status: AC
  Filled 2022-02-02: qty 1

## 2022-02-02 MED ORDER — SODIUM CHLORIDE (PF) 0.9 % IJ SOLN
INTRAMUSCULAR | Status: AC
  Filled 2022-02-02: qty 50

## 2022-02-02 MED ORDER — LIDOCAINE HCL (PF) 2 % IJ SOLN
INTRAMUSCULAR | Status: AC
  Filled 2022-02-02: qty 5

## 2022-02-02 MED ORDER — 0.9 % SODIUM CHLORIDE (POUR BTL) OPTIME
TOPICAL | Status: DC | PRN
  Administered 2022-02-02: 1000 mL

## 2022-02-02 MED ORDER — HYDROMORPHONE HCL 1 MG/ML IJ SOLN
INTRAMUSCULAR | Status: AC
  Administered 2022-02-02: 0.5 mg via INTRAVENOUS
  Filled 2022-02-02: qty 1

## 2022-02-02 MED ORDER — ASPIRIN 81 MG PO CHEW
81.0000 mg | CHEWABLE_TABLET | Freq: Two times a day (BID) | ORAL | Status: DC
  Administered 2022-02-02 – 2022-02-06 (×8): 81 mg via ORAL
  Filled 2022-02-02 (×8): qty 1

## 2022-02-02 MED ORDER — PROPOFOL 10 MG/ML IV BOLUS
INTRAVENOUS | Status: DC | PRN
  Administered 2022-02-02: 70 mg via INTRAVENOUS

## 2022-02-02 MED ORDER — ACETAMINOPHEN 325 MG PO TABS
325.0000 mg | ORAL_TABLET | Freq: Four times a day (QID) | ORAL | Status: DC | PRN
  Administered 2022-02-04: 650 mg via ORAL
  Filled 2022-02-02: qty 2

## 2022-02-02 MED ORDER — KETOROLAC TROMETHAMINE 15 MG/ML IJ SOLN
7.5000 mg | Freq: Four times a day (QID) | INTRAMUSCULAR | Status: AC
  Administered 2022-02-02 – 2022-02-03 (×3): 7.5 mg via INTRAVENOUS
  Filled 2022-02-02 (×3): qty 1

## 2022-02-02 MED ORDER — ZOLPIDEM TARTRATE 5 MG PO TABS: 5.0000 mg | ORAL_TABLET | Freq: Every evening | ORAL | Status: DC | PRN

## 2022-02-02 MED ORDER — ONDANSETRON HCL 4 MG/2ML IJ SOLN
INTRAMUSCULAR | Status: AC
  Filled 2022-02-02: qty 2

## 2022-02-02 MED ORDER — METHOCARBAMOL 500 MG IVPB - SIMPLE MED
500.0000 mg | Freq: Four times a day (QID) | INTRAVENOUS | Status: DC | PRN
  Administered 2022-02-02: 500 mg via INTRAVENOUS

## 2022-02-02 MED ORDER — ISOPROPYL ALCOHOL 70 % SOLN
Status: AC
  Filled 2022-02-02: qty 480

## 2022-02-02 MED ORDER — PANTOPRAZOLE SODIUM 40 MG PO TBEC
40.0000 mg | DELAYED_RELEASE_TABLET | Freq: Every day | ORAL | Status: DC
  Administered 2022-02-02 – 2022-02-06 (×5): 40 mg via ORAL
  Filled 2022-02-02 (×5): qty 1

## 2022-02-02 MED ORDER — DIPHENHYDRAMINE HCL 12.5 MG/5ML PO ELIX: 12.5000 mg | ORAL_SOLUTION | ORAL | Status: DC | PRN

## 2022-02-02 MED ORDER — FENTANYL CITRATE (PF) 250 MCG/5ML IJ SOLN
INTRAMUSCULAR | Status: DC | PRN
  Administered 2022-02-02: 25 ug via INTRAVENOUS
  Administered 2022-02-02: 50 ug via INTRAVENOUS
  Administered 2022-02-02 (×4): 25 ug via INTRAVENOUS
  Administered 2022-02-02: 50 ug via INTRAVENOUS
  Administered 2022-02-02: 25 ug via INTRAVENOUS

## 2022-02-02 MED ORDER — VITAMIN B-12 1000 MCG PO TABS
2500.0000 ug | ORAL_TABLET | Freq: Every day | ORAL | Status: DC
  Administered 2022-02-04 – 2022-02-06 (×3): 2500 ug via ORAL
  Filled 2022-02-02 (×4): qty 3

## 2022-02-02 MED ORDER — ACETAMINOPHEN 500 MG PO TABS
1000.0000 mg | ORAL_TABLET | Freq: Once | ORAL | Status: DC
  Filled 2022-02-02: qty 2

## 2022-02-02 MED ORDER — FENTANYL CITRATE PF 50 MCG/ML IJ SOSY
PREFILLED_SYRINGE | INTRAMUSCULAR | Status: AC
  Filled 2022-02-02: qty 1

## 2022-02-02 MED ORDER — LEVETIRACETAM 500 MG PO TABS
500.0000 mg | ORAL_TABLET | Freq: Every day | ORAL | Status: DC
  Administered 2022-02-02 – 2022-02-05 (×4): 500 mg via ORAL
  Filled 2022-02-02 (×4): qty 1

## 2022-02-02 MED ORDER — BUPIVACAINE LIPOSOME 1.3 % IJ SUSP: 10.0000 mL | Freq: Once | INTRAMUSCULAR | Status: AC

## 2022-02-02 MED ORDER — MIDAZOLAM HCL 2 MG/2ML IJ SOLN
INTRAMUSCULAR | Status: AC
  Filled 2022-02-02: qty 2

## 2022-02-02 MED ORDER — PHENOL 1.4 % MT LIQD: 1.0000 | OROMUCOSAL | Status: DC | PRN

## 2022-02-02 MED ORDER — LORATADINE 10 MG PO TABS: 10.0000 mg | ORAL_TABLET | Freq: Every day | ORAL | Status: DC | PRN

## 2022-02-02 MED ORDER — ONDANSETRON HCL 4 MG PO TABS
4.0000 mg | ORAL_TABLET | Freq: Four times a day (QID) | ORAL | Status: DC | PRN
  Administered 2022-02-02: 4 mg via ORAL
  Filled 2022-02-02: qty 1

## 2022-02-02 MED ORDER — OXYCODONE HCL 5 MG PO TABS
5.0000 mg | ORAL_TABLET | ORAL | Status: DC | PRN
  Administered 2022-02-02 – 2022-02-04 (×8): 5 mg via ORAL
  Filled 2022-02-02 (×8): qty 1

## 2022-02-02 MED ORDER — CHLORHEXIDINE GLUCONATE 0.12 % MT SOLN
15.0000 mL | Freq: Once | OROMUCOSAL | Status: AC
  Administered 2022-02-02: 15 mL via OROMUCOSAL

## 2022-02-02 MED ORDER — HYDROMORPHONE HCL 1 MG/ML IJ SOLN
0.5000 mg | INTRAMUSCULAR | Status: DC | PRN
  Administered 2022-02-02: 0.5 mg via INTRAVENOUS
  Administered 2022-02-02: 1 mg via INTRAVENOUS
  Filled 2022-02-02: qty 1

## 2022-02-02 MED ORDER — SENNA 8.6 MG PO TABS
1.0000 | ORAL_TABLET | Freq: Two times a day (BID) | ORAL | Status: DC
  Administered 2022-02-02 – 2022-02-06 (×8): 8.6 mg via ORAL
  Filled 2022-02-02 (×8): qty 1

## 2022-02-02 MED ORDER — KETOROLAC TROMETHAMINE 15 MG/ML IJ SOLN
INTRAMUSCULAR | Status: AC
  Filled 2022-02-02: qty 1

## 2022-02-02 MED ORDER — SODIUM CHLORIDE (PF) 0.9 % IJ SOLN
INTRAMUSCULAR | Status: DC | PRN
  Administered 2022-02-02: 60 mL

## 2022-02-02 SURGICAL SUPPLY — 79 items
ADH SKN CLS APL DERMABOND .7 (GAUZE/BANDAGES/DRESSINGS) ×1
APL PRP STRL LF DISP 70% ISPRP (MISCELLANEOUS) ×2
BAG COUNTER SPONGE SURGICOUNT (BAG) IMPLANT
BAG DECANTER FOR FLEXI CONT (MISCELLANEOUS) ×3 IMPLANT
BAG SPEC THK2 15X12 ZIP CLS (MISCELLANEOUS) ×1
BAG SPNG CNTER NS LX DISP (BAG)
BAG ZIPLOCK 12X15 (MISCELLANEOUS) ×3 IMPLANT
BLADE SAW SAG 25X90X1.19 (BLADE) IMPLANT
BLADE SAW SGTL 81X20 HD (BLADE) ×3 IMPLANT
BRUSH FEMORAL CANAL (MISCELLANEOUS) ×1 IMPLANT
CEMENT BONE SIMPLEX SPEEDSET (Cement) ×2 IMPLANT
CEMENT RESTRICTOR BONE PREP ST (Cement) ×1 IMPLANT
CHLORAPREP W/TINT 26 (MISCELLANEOUS) ×6 IMPLANT
COVER SURGICAL LIGHT HANDLE (MISCELLANEOUS) ×3 IMPLANT
DERMABOND ADVANCED (GAUZE/BANDAGES/DRESSINGS) ×1
DERMABOND ADVANCED .7 DNX12 (GAUZE/BANDAGES/DRESSINGS) ×2 IMPLANT
DRAPE 3/4 80X56 (DRAPES) ×6 IMPLANT
DRAPE C-ARM 42X120 X-RAY (DRAPES) ×3 IMPLANT
DRAPE HIP W/POCKET STRL (MISCELLANEOUS) ×3 IMPLANT
DRAPE INCISE IOBAN 66X45 STRL (DRAPES) ×3 IMPLANT
DRAPE INCISE IOBAN 85X60 (DRAPES) ×3 IMPLANT
DRAPE POUCH INSTRU U-SHP 10X18 (DRAPES) ×3 IMPLANT
DRAPE SURG 17X11 SM STRL (DRAPES) ×3 IMPLANT
DRAPE U-SHAPE 47X51 STRL (DRAPES) ×3 IMPLANT
DRESSING AQUACEL AG SP 3.5X10 (GAUZE/BANDAGES/DRESSINGS) ×2 IMPLANT
DRSG AQUACEL AG ADV 3.5X10 (GAUZE/BANDAGES/DRESSINGS) ×1 IMPLANT
DRSG AQUACEL AG SP 3.5X10 (GAUZE/BANDAGES/DRESSINGS) ×2
ELECT BLADE TIP CTD 4 INCH (ELECTRODE) ×3 IMPLANT
ELECT NDL TIP 2.8 STRL (NEEDLE) ×2 IMPLANT
ELECT NEEDLE TIP 2.8 STRL (NEEDLE) ×2 IMPLANT
ELECT REM PT RETURN 15FT ADLT (MISCELLANEOUS) ×3 IMPLANT
GLOVE BIOGEL PI IND STRL 8 (GLOVE) ×4 IMPLANT
GLOVE BIOGEL PI INDICATOR 8 (GLOVE) ×2
GLOVE SURG LX 8.0 MICRO (GLOVE) ×2
GLOVE SURG LX STRL 8.0 MICRO (GLOVE) ×4 IMPLANT
GLOVE SURG ORTHO 8.0 STRL STRW (GLOVE) ×3 IMPLANT
GOWN STRL REUS W/ TWL XL LVL3 (GOWN DISPOSABLE) ×2 IMPLANT
GOWN STRL REUS W/TWL XL LVL3 (GOWN DISPOSABLE) ×2
HANDPIECE INTERPULSE COAX TIP (DISPOSABLE)
HEAD CERAMIC FEMORAL 36MM (Head) ×1 IMPLANT
HOLDER FOLEY CATH W/STRAP (MISCELLANEOUS) ×3 IMPLANT
HOOD PEEL AWAY FLYTE STAYCOOL (MISCELLANEOUS) ×9 IMPLANT
INSERT 0 DEGREE 36 (Miscellaneous) ×1 IMPLANT
KIT BASIN OR (CUSTOM PROCEDURE TRAY) ×3 IMPLANT
KIT TURNOVER KIT A (KITS) IMPLANT
MANIFOLD NEPTUNE II (INSTRUMENTS) ×3 IMPLANT
MARKER SKIN DUAL TIP RULER LAB (MISCELLANEOUS) ×3 IMPLANT
Medium Cement Restrictor ×1 IMPLANT
NEEDLE HYPO 22GX1.5 SAFETY (NEEDLE) IMPLANT
NS IRRIG 1000ML POUR BTL (IV SOLUTION) ×3 IMPLANT
PACK TOTAL JOINT (CUSTOM PROCEDURE TRAY) ×3 IMPLANT
PRESSURIZER FEMORAL UNIV (MISCELLANEOUS) ×1 IMPLANT
PROTECTOR NERVE ULNAR (MISCELLANEOUS) ×3 IMPLANT
RETRIEVER SUT HEWSON (MISCELLANEOUS) ×3 IMPLANT
SCREW HEX LP 6.5X20 (Screw) ×1 IMPLANT
SCREW HEX LP 6.5X35 (Screw) ×1 IMPLANT
SEALER BIPOLAR AQUA 6.0 (INSTRUMENTS) ×3 IMPLANT
SET HNDPC FAN SPRY TIP SCT (DISPOSABLE) IMPLANT
SHELL TRIDENT II CLUST 50 (Shell) ×1 IMPLANT
SOLUTION IRRIG SURGIPHOR (IV SOLUTION) ×3 IMPLANT
SPIKE FLUID TRANSFER (MISCELLANEOUS) ×9 IMPLANT
STEM HIP ACCOLADE SZ4 35X137 (Stem) ×1 IMPLANT
SUCTION FRAZIER HANDLE 12FR (TUBING) ×2
SUCTION TUBE FRAZIER 12FR DISP (TUBING) ×2 IMPLANT
SUT BONE WAX W31G (SUTURE) ×3 IMPLANT
SUT ETHIBOND #5 BRAIDED 30INL (SUTURE) ×3 IMPLANT
SUT MNCRL AB 3-0 PS2 18 (SUTURE) ×3 IMPLANT
SUT STRATAFIX 0 PDS 27 VIOLET (SUTURE) ×2
SUT STRATAFIX PDO 1 14 VIOLET (SUTURE) ×2
SUT STRATFX PDO 1 14 VIOLET (SUTURE) ×1
SUT VIC AB 2-0 CT2 27 (SUTURE) ×6 IMPLANT
SUTURE STRATFX 0 PDS 27 VIOLET (SUTURE) ×2 IMPLANT
SUTURE STRATFX PDO 1 14 VIOLET (SUTURE) ×2 IMPLANT
SYR 20ML LL LF (SYRINGE) ×6 IMPLANT
TOWEL OR 17X26 10 PK STRL BLUE (TOWEL DISPOSABLE) ×3 IMPLANT
TOWER CARTRIDGE SMART MIX (DISPOSABLE) ×1 IMPLANT
TRAY FOLEY MTR SLVR 16FR STAT (SET/KITS/TRAYS/PACK) ×3 IMPLANT
UNDERPAD 30X36 HEAVY ABSORB (UNDERPADS AND DIAPERS) ×3 IMPLANT
WATER STERILE IRR 1000ML POUR (IV SOLUTION) ×6 IMPLANT

## 2022-02-02 NOTE — Anesthesia Preprocedure Evaluation (Addendum)
Anesthesia Evaluation  Patient identified by MRN, date of birth, ID band Patient awake    Reviewed: Allergy & Precautions, NPO status , Patient's Chart, lab work & pertinent test results, reviewed documented beta blocker date and time   History of Anesthesia Complications Negative for: history of anesthetic complications  Airway Mallampati: II  TM Distance: >3 FB Neck ROM: Full    Dental  (+) Dental Advisory Given, Edentulous Upper, Partial Lower   Pulmonary neg pulmonary ROS,    Pulmonary exam normal        Cardiovascular hypertension, Pt. on medications and Pt. on home beta blockers + CAD and + Past MI  Normal cardiovascular exam   '22 Myoperfusion - Findings are consistent with no ischemia. The study is low risk. .  No ST deviation was noted. The ECG was negative for ischemia. .  LV perfusion is normal.  Breast attenuation artifact affects the apex and more prominently the rest imaging with otherwise normal perfusion at stress. .  Left ventricular function is normal. Nuclear stress EF: 63 %.  '22 TTE - EF 65 to 70%. There is mild left ventricular hypertrophy. Grade I diastolic dysfunction (impaired relaxation). Trivial mitral valve regurgitation.There is mild dilatation of the ascending aorta, measuring 37 mm.    Neuro/Psych  Headaches, Seizures -, Well Controlled,  PSYCHIATRIC DISORDERS Anxiety    GI/Hepatic Neg liver ROS, GERD  Medicated and Controlled,  Endo/Other  Hypothyroidism   Renal/GU negative Renal ROS     Musculoskeletal  (+) Arthritis ,   Abdominal   Peds  Hematology negative hematology ROS (+)   Anesthesia Other Findings   Reproductive/Obstetrics                            Anesthesia Physical Anesthesia Plan  ASA: 3  Anesthesia Plan: General   Post-op Pain Management: Tylenol PO (pre-op)*   Induction: Intravenous  PONV Risk Score and Plan: 3 and Treatment may vary  due to age or medical condition, Ondansetron and Propofol infusion  Airway Management Planned: Oral ETT  Additional Equipment: None  Intra-op Plan:   Post-operative Plan: Extubation in OR  Informed Consent: I have reviewed the patients History and Physical, chart, labs and discussed the procedure including the risks, benefits and alternatives for the proposed anesthesia with the patient or authorized representative who has indicated his/her understanding and acceptance.     Dental advisory given  Plan Discussed with: CRNA and Anesthesiologist  Anesthesia Plan Comments:        Anesthesia Quick Evaluation

## 2022-02-02 NOTE — Discharge Instructions (Signed)
INSTRUCTIONS AFTER JOINT REPLACEMENT   Remove items at home which could result in a fall. This includes throw rugs or furniture in walking pathways ICE to the affected joint every three hours while awake for 30 minutes at a time, for at least the first 3-5 days, and then as needed for pain and swelling.  Continue to use ice for pain and swelling. You may notice swelling that will progress down to the foot and ankle.  This is normal after surgery.  Elevate your leg when you are not up walking on it.   Continue to use the breathing machine you got in the hospital (incentive spirometer) which will help keep your temperature down.  It is common for your temperature to cycle up and down following surgery, especially at night when you are not up moving around and exerting yourself.  The breathing machine keeps your lungs expanded and your temperature down.   DIET:  As you were doing prior to hospitalization, we recommend a well-balanced diet.  DRESSING / WOUND CARE / SHOWERING  Keep the surgical dressing until follow up.  The dressing is water proof, so you can shower without any extra covering.  IF THE DRESSING FALLS OFF or the wound gets wet inside, change the dressing with sterile gauze.  Please use good hand washing techniques before changing the dressing.  Do not use any lotions or creams on the incision until instructed by your surgeon.    ACTIVITY  Increase activity slowly as tolerated, but follow the weight bearing instructions below.   No driving for 6 weeks or until further direction given by your physician.  You cannot drive while taking narcotics.  No lifting or carrying greater than 10 lbs. until further directed by your surgeon. Avoid periods of inactivity such as sitting longer than an hour when not asleep. This helps prevent blood clots.  You may return to work once you are authorized by your doctor.     WEIGHT BEARING   Weight bearing as tolerated with assist device (walker, cane,  etc) as directed, use it as long as suggested by your surgeon or therapist, typically at least 4-6 weeks.   EXERCISES  Results after joint replacement surgery are often greatly improved when you follow the exercise, range of motion and muscle strengthening exercises prescribed by your doctor. Safety measures are also important to protect the joint from further injury. Any time any of these exercises cause you to have increased pain or swelling, decrease what you are doing until you are comfortable again and then slowly increase them. If you have problems or questions, call your caregiver or physical therapist for advice.   Rehabilitation is important following a joint replacement. After just a few days of immobilization, the muscles of the leg can become weakened and shrink (atrophy).  These exercises are designed to build up the tone and strength of the thigh and leg muscles and to improve motion. Often times heat used for twenty to thirty minutes before working out will loosen up your tissues and help with improving the range of motion but do not use heat for the first two weeks following surgery (sometimes heat can increase post-operative swelling).   These exercises can be done on a training (exercise) mat, on the floor, on a table or on a bed. Use whatever works the best and is most comfortable for you.    Use music or television while you are exercising so that the exercises are a pleasant break in your   day. This will make your life better with the exercises acting as a break in your routine that you can look forward to.   Perform all exercises about fifteen times, three times per day or as directed.  You should exercise both the operative leg and the other leg as well.  Exercises include:   Quad Sets - Tighten up the muscle on the front of the thigh (Quad) and hold for 5-10 seconds.   Straight Leg Raises - With your knee straight (if you were given a brace, keep it on), lift the leg to 60  degrees, hold for 3 seconds, and slowly lower the leg.  Perform this exercise against resistance later as your leg gets stronger.  Leg Slides: Lying on your back, slowly slide your foot toward your buttocks, bending your knee up off the floor (only go as far as is comfortable). Then slowly slide your foot back down until your leg is flat on the floor again.  Angel Wings: Lying on your back spread your legs to the side as far apart as you can without causing discomfort.  Hamstring Strength:  Lying on your back, push your heel against the floor with your leg straight by tightening up the muscles of your buttocks.  Repeat, but this time bend your knee to a comfortable angle, and push your heel against the floor.  You may put a pillow under the heel to make it more comfortable if necessary.   A rehabilitation program following joint replacement surgery can speed recovery and prevent re-injury in the future due to weakened muscles. Contact your doctor or a physical therapist for more information on knee rehabilitation.    CONSTIPATION  Constipation is defined medically as fewer than three stools per week and severe constipation as less than one stool per week.  Even if you have a regular bowel pattern at home, your normal regimen is likely to be disrupted due to multiple reasons following surgery.  Combination of anesthesia, postoperative narcotics, change in appetite and fluid intake all can affect your bowels.   YOU MUST use at least one of the following options; they are listed in order of increasing strength to get the job done.  They are all available over the counter, and you may need to use some, POSSIBLY even all of these options:    Drink plenty of fluids (prune juice may be helpful) and high fiber foods Colace 100 mg by mouth twice a day  Senokot for constipation as directed and as needed Dulcolax (bisacodyl), take with full glass of water  Miralax (polyethylene glycol) once or twice a day as  needed.  If you have tried all these things and are unable to have a bowel movement in the first 3-4 days after surgery call either your surgeon or your primary doctor.    If you experience loose stools or diarrhea, hold the medications until you stool forms back up.  If your symptoms do not get better within 1 week or if they get worse, check with your doctor.  If you experience "the worst abdominal pain ever" or develop nausea or vomiting, please contact the office immediately for further recommendations for treatment.   ITCHING:  If you experience itching with your medications, try taking only a single pain pill, or even half a pain pill at a time.  You can also use Benadryl over the counter for itching or also to help with sleep.   TED HOSE STOCKINGS:  Use stockings on both   legs until for at least 2 weeks or as directed by physician office. They may be removed at night for sleeping.  MEDICATIONS:  See your medication summary on the "After Visit Summary" that nursing will review with you.  You may have some home medications which will be placed on hold until you complete the course of blood thinner medication.  It is important for you to complete the blood thinner medication as prescribed.   Blood clot prevention (DVT Prophylaxis): After surgery you are at an increased risk for a blood clot. you were prescribed a blood thinner, Aspirin 81mg, to be taken twice daily for a total of 4 weeks from surgery to help reduce your risk of getting a blood clot. This will help prevent a blood clot. Signs of a pulmonary embolus (blood clot in the lungs) include sudden short of breath, feeling lightheaded or dizzy, chest pain with a deep breath, rapid pulse rapid breathing. Signs of a blood clot in your arms or legs include new unexplained swelling and cramping, warm, red or darkened skin around the painful area. Please call the office or 911 right away if these signs or symptoms develop.  PRECAUTIONS:  If you  experience chest pain or shortness of breath - call 911 immediately for transfer to the hospital emergency department.   If you develop a fever greater that 101 F, purulent drainage from wound, increased redness or drainage from wound, foul odor from the wound/dressing, or calf pain - CONTACT YOUR SURGEON.                                                   FOLLOW-UP APPOINTMENTS:  If you do not already have a post-op appointment, please call the office for an appointment to be seen by your surgeon.  Guidelines for how soon to be seen are listed in your "After Visit Summary", but are typically between 2-3 weeks after surgery.   POST-OPERATIVE OPIOID TAPER INSTRUCTIONS: It is important to wean off of your opioid medication as soon as possible. If you do not need pain medication after your surgery it is ok to stop day one. Opioids include: Codeine, Hydrocodone(Norco, Vicodin), Oxycodone(Percocet, oxycontin) and hydromorphone amongst others.  Long term and even short term use of opiods can cause: Increased pain response Dependence Constipation Depression Respiratory depression And more.  Withdrawal symptoms can include Flu like symptoms Nausea, vomiting And more Techniques to manage these symptoms Hydrate well Eat regular healthy meals Stay active Use relaxation techniques(deep breathing, meditating, yoga) Do Not substitute Alcohol to help with tapering If you have been on opioids for less than two weeks and do not have pain than it is ok to stop all together.  Plan to wean off of opioids This plan should start within one week post op of your joint replacement. Maintain the same interval or time between taking each dose and first decrease the dose.  Cut the total daily intake of opioids by one tablet each day Next start to increase the time between doses. The last dose that should be eliminated is the evening dose.   MAKE SURE YOU:  Understand these instructions.  Get help right away  if you are not doing well or get worse.    Thank you for letting us be a part of your medical care team.  It is a privilege   we respect greatly.  We hope these instructions will help you stay on track for a fast and full recovery!         

## 2022-02-02 NOTE — Op Note (Signed)
02/02/2022  11:57 AM  PATIENT:  Andrea Jackson   MRN: [id number]  PRE-OPERATIVE DIAGNOSIS: Malunion left femoral neck fracture status post percutaneous screw fixation  POST-OPERATIVE DIAGNOSIS:  Malunion left femoral neck fracture status post percutaneous screw fixation, significant acetabular erosion and arthritic changes  PROCEDURE:  Procedure(s): Conversion left femoral neck fracture cannulated screw fixation to total hip arthroplasty with hybrid fixation  PREOPERATIVE INDICATIONS:    Andrea Jackson is an 88 y.o. female who has a diagnosis of Malunion left femoral neck fracture status post percutaneous screw fixation and elected for surgical management after failing conservative treatment.  Given the patient's age and bone quality planned for a cemented femoral component.  The risks benefits and alternatives were discussed with the patient including but not limited to the risks of nonoperative treatment, versus surgical intervention including infection, bleeding, nerve injury, periprosthetic fracture, the need for revision surgery, dislocation, leg length discrepancy, blood clots, cardiopulmonary complications, morbidity, mortality, among others, and they were willing to proceed.     OPERATIVE REPORT     SURGEON:  Charlies Constable, MD    ASSISTANT: Nehemiah Massed, PA-C, (Present throughout the entire procedure,  necessary for completion of procedure in a timely manner, assisting with retraction, instrumentation, and closure)     ANESTHESIA:  general  ESTIMATED BLOOD LOSS: 616WV    COMPLICATIONS:  None.     UNIQUE ASPECTS OF THE CASE: Healed femoral neck fracture and malunited position shortened varus and posteriorly angulated, significant acetabular erosion  COMPONENTS:   [employer]ker Trident 2 50 mm acetabular shell, polyethylene liner, two 6.5 hex screws, 127 degree cemented Accolade C stem size 4, 36+0 ceramic head     PROCEDURE IN DETAIL:   The patient was met in the holding  area and  identified.  The appropriate hip was identified and marked at the operative site.  The patient was then transported to the OR  and  placed under anesthesia.  At that point, the patient was  placed in the lateral decubitus position with the operative side up and  secured to the operating room table  and all bony prominences padded. A subaxillary role was also placed.    The operative lower extremity was prepped from the iliac crest to the distal leg.  Sterile draping was performed.  Time out was performed prior to incision.      A routine posterolateral approach was utilized via sharp dissection  carried down to the subcutaneous tissue.  Gross bleeders were Bovie coagulated.  The iliotibial band was identified and incised along the length of the skin incision through the glute max fascia.  Charnley retractor was placed with care to protect the sciatic nerve posteriorly.  With the hip internally rotated, the piriformis tendon was identified and released from the femoral insertion and tagged with a #5 Ethibond.  A capsulotomy was then performed off the femoral insertion and also tagged with a #5 Ethibond.   The hip was then gently dislocated and then we reduced.  At this point our attention to screw removal.  Using the appropriate size screwdriver first by hand and then on power to carefully remove each screw.  No issues were encountered.  Screws were easily removed.  The hip was then redislocated and the femoral neck was evaluated.  The femoral neck was noted to be shortened, in varus, and posteriorly angulated.  No evidence of nonunion.  The fracture appears to have well-healed.  The femoral neck was exposed, and I resected the femoral  neck based on preoperative templating relative to the lesser trochanter.    I then exposed the deep acetabulum, cleared out any tissue including the ligamentum teres.  No evidence of infection was present in the hip but given that patient had prior surgery, 3 deep  tissue specimens were sent for aeorbic and anaerobic cx. The first from the lateral trochanter screw hole area and the other two from the deep acetabulum and capsule. After adequate visualization, I excised the labrum.  I then started reaming with a 46 mm reamer, first medializing to the floor of the cotyloid fossa, and then in the position of the cup aiming towards the greater sciatic notch, matching the version of the transverse acetabular ligament and tucked under the anterior wall. I reamed up to 50 mm reamer with good bony bed preparation and a 50 mm cup was chosen.  The real cup was then impacted into place.  Appropriate version and inclination was confirmed clinically matching their bony anatomy, and also with the use of the jig.  I placed 2 screws in the posterior superior quadrant to augment fixation.  A neutral liner was placed and impacted. It was confirmed to be appropriately seated and the acetabular retractors were removed.    I then prepared the proximal femur using the box cutter, Charnley awl, and then sequentially broached starting with 2 up to a size 4.  We then prepared canal for cementation.  The cement restrictor was measured and inserted distally.  The canal was then irrigated with the pulse lavage and 3 L of normal saline.  2 bags of Simplex cement were prepared.  Using the cement gun the cement was inserted distally and the canal was filled.  We then pressurized the canal. The real implant was then inserted matching the patient's native anteversion of approximately 20 degrees.  We then waited for 13 minutes for the cement to be fully set.  Excess cement was removed including cement, postop through the cannulated screw holes..  A lap was placed in the acetabulum prior to cementing was also removed and the acetabulum was assessed to make sure there was no cement or bone fragments.  A trial broach, neck, and head was utilized, and I reduced the hip and it was found to have excellent  stability.  There was no impingement with full extension and 90 degrees external rotation.  The hip was stable at the position of sleep and with 90 degrees flexion and 70 degrees of internal rotation.  Leg lengths were also clinically assessed in the lateral position and felt to be equal.  A final femoral prosthesis size 4 was selected. I then impacted the real femoral prosthesis into place.I again trialed and selected a + 0 ball. and I impacted the real head ball into place. The hip was then reduced and taken through a range of motion. There was no impingement with full extension and 90 degrees external rotation.  The hip was stable at the position of sleep and with 90 degrees flexion and 70 degrees of internal rotation. Leg lengths were  again assessed and felt to be restored.  The posterior capsule was then closed with #5 Ethibond.  The piriformis was repaired through the base of the abductor tendon using a [place] suture passer.  I then irrigated the hip copiously again with pulse lavage. Periarticular injection was then performed with Exparel.  Intraop flat plate xray was obtained and components were confirmed to be in good position without fracture. We repaired the  fascia #1 barbed suture, followed by 0 vicryl suture for the subcutaneous fat.  Skin was closed with 2-0 Vicryl and 3-0 Monocryl.  Dermabond and Aquacel dressing were applied. The patient was then awakened and returned to PACU in stable and satisfactory condition.  Leg lengths in the supine position were assessed and felt to be clinically equal. There were no complications.  Post op recs: WB: WBAT LLE, posterior hip precautions x6 weeks Abx: ancef x23 hours post op Imaging: PACU pelvis Xray Dressing: Aquacell, keep intact until follow up DVT prophylaxis: Aspirin 81BID starting POD1 Follow up: 2 weeks after surgery for a wound check with Dr. Zachery Dakins at [facility].  Address: [address], [place], [place]  [address]  Office Phone: [phone]   Charlies Constable, MD Orthopedic Surgeon
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
## 2022-02-02 NOTE — Anesthesia Procedure Notes (Signed)
Procedure Name: Intubation Date/Time: 02/02/2022 10:04 AM Performed by: Sharlette Dense, CRNA Pre-anesthesia Checklist: Patient identified Patient Re-evaluated:Patient Re-evaluated prior to induction Oxygen Delivery Method: Circle system utilized Preoxygenation: Pre-oxygenation with 100% oxygen Induction Type: IV induction Ventilation: Mask ventilation without difficulty Laryngoscope Size: Miller and 2 Grade View: Grade I Tube type: Oral Tube size: 7.0 mm Number of attempts: 1 Airway Equipment and Method: Stylet Placement Confirmation: ETT inserted through vocal cords under direct vision, positive ETCO2 and breath sounds checked- equal and bilateral Secured at: 18 cm Tube secured with: Tape Dental Injury: Teeth and Oropharynx as per pre-operative assessment

## 2022-02-02 NOTE — Plan of Care (Signed)
  Problem: Coping: Goal: Level of anxiety will decrease Outcome: Progressing   Problem: Pain Managment: Goal: General experience of comfort will improve Outcome: Progressing   Problem: Safety: Goal: Ability to remain free from injury will improve Outcome: Progressing   

## 2022-02-02 NOTE — Interval H&P Note (Signed)
The patient has been re-examined, and the chart reviewed, and there have been no interval changes to the documented history and physical.    Plan for left hip conversion from malunion of femoral neck fracture fixed with cannulated screw fixation. Failed nonop treatment. Pain from both hip/groin and screw prominence. Short term relief following injections into both areas. Significant leg length discrepancy and loss of offset. Discussed plan to restore length and offset.  The operative side was examined and the patient was confirmed to have. Sens DPN, SPN, TN intact, Motor EHL, ext, flex 5/5, and DP 2+, PT 2+, No significant edema.   The risks, benefits, and alternatives have been discussed at length with patient, and the patient is willing to proceed.  Left hip marked. Consent has been signed.

## 2022-02-02 NOTE — Anesthesia Procedure Notes (Signed)
Date/Time: 02/02/2022 9:45 AM Performed by: Sharlette Dense, CRNA Oxygen Delivery Method: Nasal cannula

## 2022-02-02 NOTE — Progress Notes (Signed)
     Subjective:  Patient reports pain as mild.  Denies distal n/t. Tried to work with PT this afternoon but was limited by pain and BP issues. Did primarily exercises in bed and sat up to side of bed. Motivated to work with PT tomorrow.  Objective:   VITALS:   Vitals:   02/02/22 1415 02/02/22 1430 02/02/22 1504 02/02/22 1709  BP: (!) 106/57 (!) 119/49 119/61 127/66  Pulse: 61 68 66 64  Resp: (!) 7 (!) '22 17 17  '$ Temp: (!) 97.5 F (36.4 C)  97.7 F (36.5 C)   TempSrc:   Oral   SpO2: 93% 93% 100% 99%  Weight:      Height:        Sensation intact distally Intact pulses distally Dorsiflexion/Plantar flexion intact Incision: dressing C/D/I Compartment soft   Lab Results  Component Value Date   WBC 5.7 01/21/2022   HGB 14.3 01/21/2022   HCT 45.7 01/21/2022   MCV 96.4 01/21/2022   PLT 218 01/21/2022   BMET    Component Value Date/Time   NA 139 01/21/2022 1013   K 4.5 01/21/2022 1013   CL 102 01/21/2022 1013   CO2 29 01/21/2022 1013   GLUCOSE 94 01/21/2022 1013   BUN 20 01/21/2022 1013   CREATININE 0.95 01/21/2022 1013   CREATININE 0.92 (H) 07/24/2017 1024   CALCIUM 9.3 01/21/2022 1013   GFRNONAA 59 (L) 01/21/2022 1013      Xray: post op xrays demonstrated arthroplasty components in good position without adverse features  Assessment/Plan: Day of Surgery   Principal Problem:   Closed displaced fracture of left femoral neck with malunion  S/p conversion L FN fx CRPP to THA 5/24  Post op recs: WB: WBAT LLE, posterior hip precautions x6 weeks Abx: ancef x23 hours post op Imaging: PACU pelvis Xray Dressing: Aquacell, keep intact until follow up DVT prophylaxis: Aspirin 81BID starting POD1 Follow up: 2 weeks after surgery for a wound check with Dr. Zachery Dakins at [facility].  Address: [address] [place], [place], [place] [address]  Office Phone: [phone]    Andrea Jackson 02/02/2022, 8:24 PM   Charlies Constable,  MD  Contact information:   [phone] 7am-5pm epic message Dr. Zachery Dakins, or call office for patient follow up: [phone] After hours and holidays please check [email or web address] for group call information for [facility]

## 2022-02-02 NOTE — Anesthesia Postprocedure Evaluation (Signed)
Anesthesia Post Note  Patient: Andrea Jackson  Procedure(s) Performed: CONVERSION LEFT HIP PINNING TO TOTAL HIP (Left: Hip)     Patient location during evaluation: PACU Anesthesia Type: General Level of consciousness: awake and alert Pain management: pain level controlled Vital Signs Assessment: post-procedure vital signs reviewed and stable Respiratory status: spontaneous breathing, nonlabored ventilation, respiratory function stable and patient connected to nasal cannula oxygen Cardiovascular status: blood pressure returned to baseline and stable Postop Assessment: no apparent nausea or vomiting Anesthetic complications: no   No notable events documented.  Last Vitals:  Vitals:   02/02/22 1415 02/02/22 1430  BP: (!) 106/57 (!) 119/49  Pulse: 61 68  Resp: (!) 7 (!) 22  Temp: (!) 36.4 C   SpO2: 93% 93%    Last Pain:  Vitals:   02/02/22 1415  TempSrc:   PainSc: Marshall Azura Tufaro

## 2022-02-02 NOTE — Transfer of Care (Signed)
Immediate Anesthesia Transfer of Care Note  Patient: Andrea Jackson  Procedure(s) Performed: CONVERSION LEFT HIP PINNING TO TOTAL HIP (Left: Hip)  Patient Location: PACU  Anesthesia Type:General  Level of Consciousness: drowsy  Airway & Oxygen Therapy: Patient Spontanous Breathing and Patient connected to face mask oxygen  Post-op Assessment: Report given to RN and Post -op Vital signs reviewed and stable  Post vital signs: Reviewed and stable  Last Vitals:  Vitals Value Taken Time  BP    Temp    Pulse 67 02/02/22 1251  Resp 8 02/02/22 1251  SpO2 100 % 02/02/22 1251  Vitals shown include unvalidated device data.  Last Pain:  Vitals:   02/02/22 0731  TempSrc: Oral      Patients Stated Pain Goal: 3 (81/82/99 3716)  Complications: No notable events documented.

## 2022-02-03 ENCOUNTER — Encounter (HOSPITAL_COMMUNITY): Payer: Self-pay | Admitting: Orthopedic Surgery

## 2022-02-03 LAB — CBC
HCT: 32.6 % — ABNORMAL LOW (ref 36.0–46.0)
Hemoglobin: 10.4 g/dL — ABNORMAL LOW (ref 12.0–15.0)
MCH: 30.4 pg (ref 26.0–34.0)
MCHC: 31.9 g/dL (ref 30.0–36.0)
MCV: 95.3 fL (ref 80.0–100.0)
Platelets: 153 10*3/uL (ref 150–400)
RBC: 3.42 MIL/uL — ABNORMAL LOW (ref 3.87–5.11)
RDW: 12.8 % (ref 11.5–15.5)
WBC: 7.4 10*3/uL (ref 4.0–10.5)
nRBC: 0 % (ref 0.0–0.2)

## 2022-02-03 LAB — BASIC METABOLIC PANEL
Anion gap: 5 (ref 5–15)
BUN: 20 mg/dL (ref 8–23)
CO2: 26 mmol/L (ref 22–32)
Calcium: 8.3 mg/dL — ABNORMAL LOW (ref 8.9–10.3)
Chloride: 105 mmol/L (ref 98–111)
Creatinine, Ser: 0.86 mg/dL (ref 0.44–1.00)
GFR, Estimated: >60 mL/min (ref >60)
Glucose, Bld: 121 mg/dL — ABNORMAL HIGH (ref 70–99)
Potassium: 4.4 mmol/L (ref 3.5–5.1)
Sodium: 136 mmol/L (ref 135–145)

## 2022-02-03 MED ORDER — CEFADROXIL 500 MG PO CAPS
500.0000 mg | ORAL_CAPSULE | Freq: Two times a day (BID) | ORAL | Status: DC
  Administered 2022-02-03 – 2022-02-06 (×7): 500 mg via ORAL
  Filled 2022-02-03 (×8): qty 1

## 2022-02-03 MED ORDER — PROMETHAZINE HCL 25 MG PO TABS
12.5000 mg | ORAL_TABLET | Freq: Four times a day (QID) | ORAL | Status: DC | PRN
  Administered 2022-02-03 – 2022-02-04 (×2): 12.5 mg via ORAL
  Filled 2022-02-03 (×2): qty 1

## 2022-02-03 NOTE — TOC Transition Note (Signed)
Transition of Care [facility]) - CM/SW Discharge Note   Patient Details  Name: Andrea Jackson MRN: [id number] Date of Birth: [birth year]  Transition of Care [facility]) CM/SW Contact:  Vassie Moselle, LCSW Phone Number: 02/03/2022, 10:56 AM   Clinical Narrative:    Met with patient who shared that [facility] is their first choice for HHPT. [facility] has worked with this patient in the past and will follow for HHPT at discharge. No DME needs identified. No further TOC needs.    Final next level of care: [place] Barriers to Discharge: No Barriers Identified   Patient Goals and CMS Choice Patient states their goals for this hospitalization and ongoing recovery are:: Return home with HHPT   Choice offered to / list presented to : Patient  Discharge Placement                       Discharge Plan and Services                DME Arranged: N/A         HH Arranged: PT HH Agency: Other - See comment ([place]) Date HH Agency Contacted: 02/03/22 Time [place]: 1056 Representative spoke with at [place]: [place] ([place]) Interventions     Readmission Risk Interventions     View : No data to display.

## 2022-02-03 NOTE — Progress Notes (Addendum)
     Subjective:  Patient reports pain as well controlled. Slept well overnight. Hgb 10.4 this am. Plan for PT today. Patient eager to mobilize. Denies distal n/t. No issues.  Objective:   VITALS:   Vitals:   02/02/22 1709 02/02/22 2025 02/03/22 0145 02/03/22 0511  BP: 127/66 (!) 142/90 (!) 105/56 128/65  Pulse: 64 67 65 63  Resp: '17 18 18 18  '$ Temp:  (!) 96.8 F (36 C) 97.7 F (36.5 C) 98.2 F (36.8 C)  TempSrc:      SpO2: 99% 100% 99% 100%  Weight:      Height:        Sensation intact distally Intact pulses distally Dorsiflexion/Plantar flexion intact Incision: dressing C/D/I Compartment soft   Lab Results  Component Value Date   WBC 7.4 02/03/2022   HGB 10.4 (L) 02/03/2022   HCT 32.6 (L) 02/03/2022   MCV 95.3 02/03/2022   PLT 153 02/03/2022   BMET    Component Value Date/Time   NA 136 02/03/2022 0318   K 4.4 02/03/2022 0318   CL 105 02/03/2022 0318   CO2 26 02/03/2022 0318   GLUCOSE 121 (H) 02/03/2022 0318   BUN 20 02/03/2022 0318   CREATININE 0.86 02/03/2022 0318   CREATININE 0.92 (H) 07/24/2017 1024   CALCIUM 8.3 (L) 02/03/2022 0318   GFRNONAA >60 02/03/2022 0318      Xray: post op xrays demonstrated arthroplasty components in good position without adverse features  Assessment/Plan: 1 Day Post-Op   Principal Problem:   Closed displaced fracture of left femoral neck with malunion  S/p conversion L FN fx CRPP to THA 5/24  Post op recs: WB: WBAT LLE, posterior hip precautions x6 weeks Abx: ancef x23 hours post op, cefadroxil 500 BID x7 days given conversion surgery, 3 deep tissue intraop cxs sent, 1/3 gram stain positive, likely false positive, will await final cxs Imaging: PACU pelvis Xray Dressing: Aquacell, keep intact until follow up DVT prophylaxis: Aspirin 81BID starting POD1 Follow up: 2 weeks after surgery for a wound check with Dr. Zachery Dakins at [facility].  Address: [address] [place], [place], [place] [address]   Office Phone: [phone]    Willaim Sheng 02/03/2022, 7:03 AM   Charlies Constable, MD  Contact information:   [phone] 7am-5pm epic message Dr. Zachery Dakins, or call office for patient follow up: [phone] After hours and holidays please check [email or web address] for group call information for [facility]
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
## 2022-02-04 LAB — CBC
HCT: 30.5 % — ABNORMAL LOW (ref 36.0–46.0)
Hemoglobin: 9.8 g/dL — ABNORMAL LOW (ref 12.0–15.0)
MCH: 30.5 pg (ref 26.0–34.0)
MCHC: 32.1 g/dL (ref 30.0–36.0)
MCV: 95 fL (ref 80.0–100.0)
Platelets: 141 10*3/uL — ABNORMAL LOW (ref 150–400)
RBC: 3.21 MIL/uL — ABNORMAL LOW (ref 3.87–5.11)
RDW: 13 % (ref 11.5–15.5)
WBC: 5.9 10*3/uL (ref 4.0–10.5)
nRBC: 0 % (ref 0.0–0.2)

## 2022-02-04 MED ORDER — HYDROCODONE-ACETAMINOPHEN 5-325 MG PO TABS
1.0000 | ORAL_TABLET | ORAL | Status: DC | PRN
  Administered 2022-02-04 – 2022-02-06 (×4): 1 via ORAL
  Filled 2022-02-04 (×4): qty 1

## 2022-02-04 MED ORDER — PROMETHAZINE HCL 25 MG PO TABS
12.5000 mg | ORAL_TABLET | Freq: Four times a day (QID) | ORAL | Status: DC | PRN
  Administered 2022-02-05 (×2): 25 mg via ORAL
  Filled 2022-02-04 (×3): qty 1
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
## 2022-02-04 NOTE — Plan of Care (Signed)
  Problem: Clinical Measurements: Goal: Ability to maintain clinical measurements within normal limits will improve Outcome: Progressing   Problem: Activity: Goal: Risk for activity intolerance will decrease Outcome: Progressing   Problem: Coping: Goal: Level of anxiety will decrease Outcome: Progressing   Problem: Elimination: Goal: Will not experience complications related to bowel motility Outcome: Progressing   Problem: Pain Managment: Goal: General experience of comfort will improve Outcome: Progressing   Problem: Skin Integrity: Goal: Risk for impaired skin integrity will decrease Outcome: Progressing

## 2022-02-04 NOTE — Progress Notes (Signed)
     Subjective: Patient had issues with nausea and vomiting yesterday afternoon/evening. Last episode was yesterday evening but still concerned she won't be able to keep food down. Home phenergan reordered. Will also reduce oxycodone to hydrocodone. Denies distal n/t. Worked well with PT today. Plan for home once keep food down better and clears PT. Hgb 9.8 this AM.  Objective:   VITALS:   Vitals:   02/03/22 0931 02/03/22 1352 02/03/22 2058 02/04/22 0525  BP: 107/72 (!) 97/53 (!) 118/56 108/60  Pulse: 66 60 72 78  Resp: '18 18 16 14  '$ Temp: 97.7 F (36.5 C) 98.1 F (36.7 C) 99.6 F (37.6 C) 99.6 F (37.6 C)  TempSrc: Oral Oral Oral Oral  SpO2: 94% 96% 96% 90%  Weight:      Height:        Sensation intact distally Intact pulses distally Dorsiflexion/Plantar flexion intact Incision: dressing C/D/I Compartment soft   Lab Results  Component Value Date   WBC 5.9 02/04/2022   HGB 9.8 (L) 02/04/2022   HCT 30.5 (L) 02/04/2022   MCV 95.0 02/04/2022   PLT 141 (L) 02/04/2022   BMET    Component Value Date/Time   NA 136 02/03/2022 0318   K 4.4 02/03/2022 0318   CL 105 02/03/2022 0318   CO2 26 02/03/2022 0318   GLUCOSE 121 (H) 02/03/2022 0318   BUN 20 02/03/2022 0318   CREATININE 0.86 02/03/2022 0318   CREATININE 0.92 (H) 07/24/2017 1024   CALCIUM 8.3 (L) 02/03/2022 0318   GFRNONAA >60 02/03/2022 0318      Xray: post op xrays demonstrated arthroplasty components in good position without adverse features  Assessment/Plan: 2 Days Post-Op   Principal Problem:   Closed displaced fracture of left femoral neck with malunion  S/p conversion L FN fx CRPP to THA 5/24  Post op recs: WB: WBAT LLE, posterior hip precautions x6 weeks Abx: ancef x23 hours post op, cefadroxil 500 BID x7 days given conversion surgery, 3 deep tissue intraop cxs sent, 1/3 gram stain positive, likely false positive, will await final cxs Imaging: PACU pelvis Xray Dressing: Aquacell, keep intact  until follow up DVT prophylaxis: Aspirin 81BID starting POD1 Follow up: 2 weeks after surgery for a wound check with Dr. Zachery Dakins at [facility].  Address: [address] [place], [place], [place] [address]  Office Phone: [phone]    Willaim Sheng 02/04/2022, 5:55 AM   Charlies Constable, MD  Contact information:   [phone] 7am-5pm epic message Dr. Zachery Dakins, or call office for patient follow up: [phone] After hours and holidays please check [email or web address] for group call information for [facility]

## 2022-02-04 NOTE — Plan of Care (Signed)
  Problem: Clinical Measurements: Goal: Ability to maintain clinical measurements within normal limits will improve Outcome: Progressing   Problem: Activity: Goal: Risk for activity intolerance will decrease Outcome: Progressing   Problem: Pain Managment: Goal: General experience of comfort will improve Outcome: Progressing   Problem: Safety: Goal: Ability to remain free from injury will improve Outcome: Progressing   

## 2022-02-05 ENCOUNTER — Other Ambulatory Visit (HOSPITAL_COMMUNITY): Payer: Self-pay

## 2022-02-05 ENCOUNTER — Encounter (HOSPITAL_COMMUNITY): Payer: Self-pay | Admitting: Hematology

## 2022-02-05 LAB — BASIC METABOLIC PANEL
Anion gap: 3 — ABNORMAL LOW (ref 5–15)
BUN: 17 mg/dL (ref 8–23)
CO2: 32 mmol/L (ref 22–32)
Calcium: 8.4 mg/dL — ABNORMAL LOW (ref 8.9–10.3)
Chloride: 102 mmol/L (ref 98–111)
Creatinine, Ser: 0.74 mg/dL (ref 0.44–1.00)
GFR, Estimated: >60 mL/min (ref >60)
Glucose, Bld: 104 mg/dL — ABNORMAL HIGH (ref 70–99)
Potassium: 4 mmol/L (ref 3.5–5.1)
Sodium: 137 mmol/L (ref 135–145)

## 2022-02-05 LAB — CBC
HCT: 30.4 % — ABNORMAL LOW (ref 36.0–46.0)
Hemoglobin: 9.9 g/dL — ABNORMAL LOW (ref 12.0–15.0)
MCH: 31.3 pg (ref 26.0–34.0)
MCHC: 32.6 g/dL (ref 30.0–36.0)
MCV: 96.2 fL (ref 80.0–100.0)
Platelets: 143 10*3/uL — ABNORMAL LOW (ref 150–400)
RBC: 3.16 MIL/uL — ABNORMAL LOW (ref 3.87–5.11)
RDW: 13 % (ref 11.5–15.5)
WBC: 5.3 10*3/uL (ref 4.0–10.5)
nRBC: 0 % (ref 0.0–0.2)

## 2022-02-05 MED ORDER — PROMETHAZINE HCL 12.5 MG PO TABS
12.5000 mg | ORAL_TABLET | Freq: Three times a day (TID) | ORAL | 0 refills | Status: DC | PRN
  Filled 2022-02-05: qty 30, 5d supply, fill #0

## 2022-02-05 MED ORDER — ASPIRIN 81 MG PO TBEC
81.0000 mg | DELAYED_RELEASE_TABLET | Freq: Two times a day (BID) | ORAL | 0 refills | Status: AC
  Filled 2022-02-05: qty 56, 28d supply, fill #0

## 2022-02-05 MED ORDER — CEFADROXIL 500 MG PO CAPS
500.0000 mg | ORAL_CAPSULE | Freq: Two times a day (BID) | ORAL | 0 refills | Status: DC
  Filled 2022-02-05: qty 10, 5d supply, fill #0

## 2022-02-05 MED ORDER — HYDROCODONE-ACETAMINOPHEN 5-325 MG PO TABS
1.0000 | ORAL_TABLET | ORAL | 0 refills | Status: DC | PRN
  Filled 2022-02-05: qty 30, 5d supply, fill #0

## 2022-02-05 MED ORDER — ACETAMINOPHEN 500 MG PO TABS
1000.0000 mg | ORAL_TABLET | Freq: Three times a day (TID) | ORAL | 0 refills | Status: AC | PRN
  Filled 2022-02-05: qty 90, 15d supply, fill #0
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
## 2022-02-05 NOTE — Plan of Care (Signed)
  Problem: Coping: Goal: Level of anxiety will decrease Outcome: Progressing   Problem: Pain Managment: Goal: General experience of comfort will improve Outcome: Progressing   

## 2022-02-05 NOTE — Plan of Care (Signed)
  Problem: Pain Managment: Goal: General experience of comfort will improve Outcome: Progressing   Problem: Safety: Goal: Ability to remain free from injury will improve Outcome: Progressing   

## 2022-02-05 NOTE — Progress Notes (Signed)
Subjective: 3 Days Post-Op Procedure(s) (LRB): CONVERSION LEFT HIP PINNING TO TOTAL HIP (Left) Patient reports pain as mild.    Objective: Vital signs in last 24 hours: Temp:  [98 F (36.7 C)-98.8 F (37.1 C)] 98 F (36.7 C) (05/27 0538) Pulse Rate:  [69-80] 80 (05/27 0847) Resp:  [15-18] 17 (05/27 0538) BP: (107-153)/(62-81) 142/73 (05/27 0847) SpO2:  [84 %-97 %] 91 % (05/27 0538)  Intake/Output from previous day: 05/26 0701 - 05/27 0700 In: 420 [P.O.:420] Out: -  Intake/Output this shift: Total I/O In: 120 [P.O.:120] Out: -   Recent Labs    02/03/22 0318 02/04/22 0319 02/05/22 0253  HGB 10.4* 9.8* 9.9*   Recent Labs    02/04/22 0319 02/05/22 0253  WBC 5.9 5.3  RBC 3.21* 3.16*  HCT 30.5* 30.4*  PLT 141* 143*   Recent Labs    02/03/22 0318 02/05/22 0253  NA 136 137  K 4.4 4.0  CL 105 102  CO2 26 32  BUN 20 17  CREATININE 0.86 0.74  GLUCOSE 121* 104*  CALCIUM 8.3* 8.4*   No results for input(s): LABPT, INR in the last 72 hours.  Neurovascular intact Incision: dressing C/D/I   Assessment/Plan: 3 Days Post-Op Procedure(s) (LRB): CONVERSION LEFT HIP PINNING TO TOTAL HIP (Left) Up with therapy D/C home with HHPT WBAT LLE, post hip precautions Nausea still present but improved did tolerated diet, breakfast oat meal/toast will plan on d/c nausea meds prn Pain control doing well as ordered Pt has some concerns with mobility, per PT notes she needs some assist with bed mobility and transfers but ambulating with min assist for good distance. She states she does have some family a grandson I believe who lives with her as well as a daughter nearby.  Would recommend d/c home with HHPT and f/u with [facility] in 2 weeks.  May work with inpt PT again today but would plan on d/c home later today.      Andrea Jackson 02/05/2022, 10:51 AM
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
## 2022-02-06 LAB — BPAM RBC
Blood Product Expiration Date: 202306042359
Blood Product Expiration Date: 202306042359
Unit Type and Rh: 6200
Unit Type and Rh: 6200

## 2022-02-06 LAB — TYPE AND SCREEN
ABO/RH(D): A POS
Antibody Screen: POSITIVE
Unit division: 0
Unit division: 0

## 2022-02-06 MED ORDER — ACETAMINOPHEN 500 MG PO TABS: 1000.0000 mg | ORAL_TABLET | Freq: Three times a day (TID) | ORAL | 0 refills | Status: AC | PRN

## 2022-02-06 MED ORDER — VITAMIN B-6 100 MG PO TABS
50.0000 mg | ORAL_TABLET | Freq: Every day | ORAL | Status: DC
Start: 2022-02-06 — End: 2022-02-06

## 2022-02-06 MED ORDER — PROMETHAZINE HCL 12.5 MG PO TABS: 12.5000 mg | ORAL_TABLET | Freq: Three times a day (TID) | ORAL | 0 refills | Status: DC | PRN

## 2022-02-06 MED ORDER — ASPIRIN 81 MG PO TBEC: 81.0000 mg | DELAYED_RELEASE_TABLET | Freq: Two times a day (BID) | ORAL | 0 refills | Status: DC

## 2022-02-06 MED ORDER — METOCLOPRAMIDE HCL 10 MG PO TABS: 10.0000 mg | ORAL_TABLET | Freq: Three times a day (TID) | ORAL | Status: DC | PRN

## 2022-02-06 MED ORDER — HYDROCODONE-ACETAMINOPHEN 5-325 MG PO TABS: 1.0000 | ORAL_TABLET | Freq: Four times a day (QID) | ORAL | 0 refills | Status: AC | PRN

## 2022-02-06 MED ORDER — CEFADROXIL 500 MG PO CAPS: 500.0000 mg | ORAL_CAPSULE | Freq: Two times a day (BID) | ORAL | 0 refills | Status: DC
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
## 2022-02-07 LAB — AEROBIC/ANAEROBIC CULTURE W GRAM STAIN (SURGICAL/DEEP WOUND)
Culture: NO GROWTH
Culture: NO GROWTH
Culture: NO GROWTH
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
## 2022-02-08 ENCOUNTER — Other Ambulatory Visit (HOSPITAL_COMMUNITY): Payer: Self-pay

## 2022-02-08 DIAGNOSIS — M6281 Muscle weakness (generalized): Secondary | ICD-10-CM | POA: Diagnosis not present

## 2022-02-08 DIAGNOSIS — M25552 Pain in left hip: Secondary | ICD-10-CM | POA: Diagnosis not present

## 2022-02-08 DIAGNOSIS — Z471 Aftercare following joint replacement surgery: Secondary | ICD-10-CM | POA: Diagnosis not present

## 2022-02-09 DIAGNOSIS — N183 Chronic kidney disease, stage 3 unspecified: Secondary | ICD-10-CM | POA: Diagnosis not present

## 2022-02-09 DIAGNOSIS — M810 Age-related osteoporosis without current pathological fracture: Secondary | ICD-10-CM | POA: Diagnosis not present

## 2022-02-09 DIAGNOSIS — I129 Hypertensive chronic kidney disease with stage 1 through stage 4 chronic kidney disease, or unspecified chronic kidney disease: Secondary | ICD-10-CM | POA: Diagnosis not present

## 2022-02-09 DIAGNOSIS — E7849 Other hyperlipidemia: Secondary | ICD-10-CM | POA: Diagnosis not present

## 2022-02-10 DIAGNOSIS — Z471 Aftercare following joint replacement surgery: Secondary | ICD-10-CM | POA: Diagnosis not present

## 2022-02-10 DIAGNOSIS — M6281 Muscle weakness (generalized): Secondary | ICD-10-CM | POA: Diagnosis not present

## 2022-02-10 DIAGNOSIS — M25552 Pain in left hip: Secondary | ICD-10-CM | POA: Diagnosis not present

## 2022-02-15 DIAGNOSIS — M25552 Pain in left hip: Secondary | ICD-10-CM | POA: Diagnosis not present

## 2022-02-15 DIAGNOSIS — Z471 Aftercare following joint replacement surgery: Secondary | ICD-10-CM | POA: Diagnosis not present

## 2022-02-15 DIAGNOSIS — M6281 Muscle weakness (generalized): Secondary | ICD-10-CM | POA: Diagnosis not present

## 2022-02-17 DIAGNOSIS — T84418D Breakdown (mechanical) of other internal orthopedic devices, implants and grafts, subsequent encounter: Secondary | ICD-10-CM | POA: Diagnosis not present

## 2022-02-18 DIAGNOSIS — Z471 Aftercare following joint replacement surgery: Secondary | ICD-10-CM | POA: Diagnosis not present

## 2022-02-18 DIAGNOSIS — M6281 Muscle weakness (generalized): Secondary | ICD-10-CM | POA: Diagnosis not present

## 2022-02-18 DIAGNOSIS — M25552 Pain in left hip: Secondary | ICD-10-CM | POA: Diagnosis not present

## 2022-02-21 DIAGNOSIS — Z471 Aftercare following joint replacement surgery: Secondary | ICD-10-CM | POA: Diagnosis not present

## 2022-02-21 DIAGNOSIS — M25552 Pain in left hip: Secondary | ICD-10-CM | POA: Diagnosis not present

## 2022-02-21 DIAGNOSIS — M6281 Muscle weakness (generalized): Secondary | ICD-10-CM | POA: Diagnosis not present

## 2022-02-24 DIAGNOSIS — M25552 Pain in left hip: Secondary | ICD-10-CM | POA: Diagnosis not present

## 2022-02-24 DIAGNOSIS — Z471 Aftercare following joint replacement surgery: Secondary | ICD-10-CM | POA: Diagnosis not present

## 2022-02-24 DIAGNOSIS — M6281 Muscle weakness (generalized): Secondary | ICD-10-CM | POA: Diagnosis not present

## 2022-02-28 ENCOUNTER — Other Ambulatory Visit: Payer: Self-pay | Admitting: *Deleted

## 2022-02-28 ENCOUNTER — Other Ambulatory Visit: Payer: Self-pay | Admitting: Cardiology

## 2022-02-28 MED ORDER — AMLODIPINE BESYLATE 10 MG PO TABS: 10.0000 mg | ORAL_TABLET | Freq: Every day | ORAL | 1 refills | Status: DC

## 2022-03-01 DIAGNOSIS — M6281 Muscle weakness (generalized): Secondary | ICD-10-CM | POA: Diagnosis not present

## 2022-03-01 DIAGNOSIS — M25552 Pain in left hip: Secondary | ICD-10-CM | POA: Diagnosis not present

## 2022-03-01 DIAGNOSIS — Z471 Aftercare following joint replacement surgery: Secondary | ICD-10-CM | POA: Diagnosis not present

## 2022-03-03 DIAGNOSIS — M25552 Pain in left hip: Secondary | ICD-10-CM | POA: Diagnosis not present

## 2022-03-03 DIAGNOSIS — Z471 Aftercare following joint replacement surgery: Secondary | ICD-10-CM | POA: Diagnosis not present

## 2022-03-03 DIAGNOSIS — M6281 Muscle weakness (generalized): Secondary | ICD-10-CM | POA: Diagnosis not present

## 2022-03-09 DIAGNOSIS — Z471 Aftercare following joint replacement surgery: Secondary | ICD-10-CM | POA: Diagnosis not present

## 2022-03-09 DIAGNOSIS — M25552 Pain in left hip: Secondary | ICD-10-CM | POA: Diagnosis not present

## 2022-03-09 DIAGNOSIS — M6281 Muscle weakness (generalized): Secondary | ICD-10-CM | POA: Diagnosis not present

## 2022-03-11 DIAGNOSIS — Z471 Aftercare following joint replacement surgery: Secondary | ICD-10-CM | POA: Diagnosis not present

## 2022-03-11 DIAGNOSIS — M25552 Pain in left hip: Secondary | ICD-10-CM | POA: Diagnosis not present

## 2022-03-11 DIAGNOSIS — M6281 Muscle weakness (generalized): Secondary | ICD-10-CM | POA: Diagnosis not present

## 2022-03-16 DIAGNOSIS — Z471 Aftercare following joint replacement surgery: Secondary | ICD-10-CM | POA: Diagnosis not present

## 2022-03-16 DIAGNOSIS — M6281 Muscle weakness (generalized): Secondary | ICD-10-CM | POA: Diagnosis not present

## 2022-03-16 DIAGNOSIS — M25552 Pain in left hip: Secondary | ICD-10-CM | POA: Diagnosis not present

## 2022-03-17 DIAGNOSIS — T84418D Breakdown (mechanical) of other internal orthopedic devices, implants and grafts, subsequent encounter: Secondary | ICD-10-CM | POA: Diagnosis not present

## 2022-03-18 DIAGNOSIS — Z471 Aftercare following joint replacement surgery: Secondary | ICD-10-CM | POA: Diagnosis not present

## 2022-03-18 DIAGNOSIS — M25552 Pain in left hip: Secondary | ICD-10-CM | POA: Diagnosis not present

## 2022-03-18 DIAGNOSIS — M6281 Muscle weakness (generalized): Secondary | ICD-10-CM | POA: Diagnosis not present

## 2022-03-24 ENCOUNTER — Inpatient Hospital Stay (HOSPITAL_COMMUNITY): Payer: Medicare Other | Attending: Hematology

## 2022-03-24 DIAGNOSIS — E538 Deficiency of other specified B group vitamins: Secondary | ICD-10-CM | POA: Diagnosis not present

## 2022-03-24 DIAGNOSIS — D509 Iron deficiency anemia, unspecified: Secondary | ICD-10-CM | POA: Insufficient documentation

## 2022-03-24 DIAGNOSIS — E559 Vitamin D deficiency, unspecified: Secondary | ICD-10-CM | POA: Insufficient documentation

## 2022-03-24 DIAGNOSIS — D508 Other iron deficiency anemias: Secondary | ICD-10-CM

## 2022-03-24 LAB — CBC WITH DIFFERENTIAL/PLATELET
Abs Immature Granulocytes: 0.01 10*3/uL (ref 0.00–0.07)
Basophils Absolute: 0 10*3/uL (ref 0.0–0.1)
Basophils Relative: 1 %
Eosinophils Absolute: 0.1 10*3/uL (ref 0.0–0.5)
Eosinophils Relative: 2 %
HCT: 38 % (ref 36.0–46.0)
Hemoglobin: 12 g/dL (ref 12.0–15.0)
Immature Granulocytes: 0 %
Lymphocytes Relative: 16 %
Lymphs Abs: 0.8 10*3/uL (ref 0.7–4.0)
MCH: 30.5 pg (ref 26.0–34.0)
MCHC: 31.6 g/dL (ref 30.0–36.0)
MCV: 96.4 fL (ref 80.0–100.0)
Monocytes Absolute: 0.4 10*3/uL (ref 0.1–1.0)
Monocytes Relative: 8 %
Neutro Abs: 3.6 10*3/uL (ref 1.7–7.7)
Neutrophils Relative %: 73 %
Platelets: ADEQUATE 10*3/uL (ref 150–400)
RBC: 3.94 MIL/uL (ref 3.87–5.11)
RDW: 13.2 % (ref 11.5–15.5)
Smear Review: ADEQUATE
WBC: 5 10*3/uL (ref 4.0–10.5)
nRBC: 0 % (ref 0.0–0.2)

## 2022-03-24 LAB — IRON AND TIBC
Iron: 36 ug/dL (ref 28–170)
Saturation Ratios: 13 % (ref 10.4–31.8)
TIBC: 285 ug/dL (ref 250–450)
UIBC: 249 ug/dL

## 2022-03-24 LAB — VITAMIN D 25 HYDROXY (VIT D DEFICIENCY, FRACTURES): Vit D, 25-Hydroxy: 52.94 ng/mL (ref 30–100)

## 2022-03-24 LAB — VITAMIN B12: Vitamin B-12: 660 pg/mL (ref 180–914)

## 2022-03-24 LAB — FERRITIN: Ferritin: 155 ng/mL (ref 11–307)

## 2022-03-27 LAB — METHYLMALONIC ACID, SERUM: Methylmalonic Acid, Quantitative: 237 nmol/L (ref 0–378)
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
## 2022-03-31 ENCOUNTER — Inpatient Hospital Stay (HOSPITAL_BASED_OUTPATIENT_CLINIC_OR_DEPARTMENT_OTHER): Payer: Medicare Other | Admitting: Physician Assistant

## 2022-03-31 VITALS — BP 111/52 | HR 62 | Temp 98.3°F | Resp 17 | Ht 63.0 in | Wt 112.6 lb

## 2022-03-31 DIAGNOSIS — D509 Iron deficiency anemia, unspecified: Secondary | ICD-10-CM | POA: Diagnosis not present

## 2022-03-31 DIAGNOSIS — E538 Deficiency of other specified B group vitamins: Secondary | ICD-10-CM

## 2022-03-31 DIAGNOSIS — E559 Vitamin D deficiency, unspecified: Secondary | ICD-10-CM

## 2022-03-31 NOTE — Patient Instructions (Signed)
[place] at [facility] Discharge Instructions  You were seen today by Tarri Abernethy PA-C for your iron deficiency anemia.  Your blood levels looked great.  Your iron level was decent.  We can wait until your next visit to see if you need any IV iron, but if you start to notice any increasing fatigue, please call our office sooner so that we can recheck labs and give IV iron if needed.   LABS: Return in 6 months for repeat labs   FOLLOW-UP APPOINTMENT: Phone visit in 6 months   - - - - - - - - - - - - - - - - - -    Thank you for choosing [place] at [facility] to provide your oncology and hematology care.  To afford each patient quality time with our provider, please arrive at least 15 minutes before your scheduled appointment time.   If you have a lab appointment with the [facility] please come in thru the Main Entrance and check in at the main information desk.  You need to re-schedule your appointment should you arrive 10 or more minutes late.  We strive to give you quality time with our providers, and arriving late affects you and other patients whose appointments are after yours.  Also, if you no show three or more times for appointments you may be dismissed from the clinic at the providers discretion.     Again, thank you for choosing [facility].  Our hope is that these requests will decrease the amount of time that you wait before being seen by our physicians.       _____________________________________________________________  Should you have questions after your visit to [facility], please contact our office at [phone] and follow the prompts.  Our office hours are 8:00 a.m. and 4:30 p.m. Monday - Friday.  Please note that voicemails left after 4:00 p.m. may not be returned until the following business day.  We are closed weekends and major holidays.  You do have access to a nurse 24-7, just  call the main number to the clinic [phone] and do not press any options, hold on the line and a nurse will answer the phone.    For prescription refill requests, have your pharmacy contact our office and allow 72 hours.    Due to Covid, you will need to wear a mask upon entering the hospital. If you do not have a mask, a mask will be given to you at the Main Entrance upon arrival. For doctor visits, patients may have 1 support person age 64 or older with them. For treatment visits, patients can not have anyone with them due to social distancing guidelines and our immunocompromised population.

## 2022-04-28 DIAGNOSIS — T84418D Breakdown (mechanical) of other internal orthopedic devices, implants and grafts, subsequent encounter: Secondary | ICD-10-CM | POA: Diagnosis not present

## 2022-05-12 DIAGNOSIS — E782 Mixed hyperlipidemia: Secondary | ICD-10-CM | POA: Diagnosis not present

## 2022-05-12 DIAGNOSIS — I10 Essential (primary) hypertension: Secondary | ICD-10-CM | POA: Diagnosis not present

## 2022-05-18 ENCOUNTER — Encounter: Payer: Self-pay | Admitting: Cardiology

## 2022-05-18 DIAGNOSIS — I10 Essential (primary) hypertension: Secondary | ICD-10-CM | POA: Diagnosis not present

## 2022-05-18 DIAGNOSIS — E7849 Other hyperlipidemia: Secondary | ICD-10-CM | POA: Diagnosis not present

## 2022-05-18 DIAGNOSIS — N1831 Chronic kidney disease, stage 3a: Secondary | ICD-10-CM | POA: Diagnosis not present

## 2022-05-24 DIAGNOSIS — Z681 Body mass index (BMI) 19 or less, adult: Secondary | ICD-10-CM | POA: Diagnosis not present

## 2022-05-24 DIAGNOSIS — N1831 Chronic kidney disease, stage 3a: Secondary | ICD-10-CM | POA: Diagnosis not present

## 2022-05-24 DIAGNOSIS — E7849 Other hyperlipidemia: Secondary | ICD-10-CM | POA: Diagnosis not present

## 2022-05-24 DIAGNOSIS — I700 Atherosclerosis of aorta: Secondary | ICD-10-CM | POA: Diagnosis not present

## 2022-05-24 DIAGNOSIS — R4582 Worries: Secondary | ICD-10-CM | POA: Diagnosis not present

## 2022-05-24 DIAGNOSIS — I10 Essential (primary) hypertension: Secondary | ICD-10-CM | POA: Diagnosis not present

## 2022-05-24 DIAGNOSIS — M1711 Unilateral primary osteoarthritis, right knee: Secondary | ICD-10-CM | POA: Diagnosis not present

## 2022-05-24 DIAGNOSIS — I25119 Atherosclerotic heart disease of native coronary artery with unspecified angina pectoris: Secondary | ICD-10-CM | POA: Diagnosis not present

## 2022-05-24 DIAGNOSIS — Z23 Encounter for immunization: Secondary | ICD-10-CM | POA: Diagnosis not present

## 2022-05-24 DIAGNOSIS — E039 Hypothyroidism, unspecified: Secondary | ICD-10-CM | POA: Diagnosis not present

## 2022-05-24 DIAGNOSIS — K2100 Gastro-esophageal reflux disease with esophagitis, without bleeding: Secondary | ICD-10-CM | POA: Diagnosis not present
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
## 2022-06-14 ENCOUNTER — Encounter: Payer: Self-pay | Admitting: *Deleted

## 2022-06-14 ENCOUNTER — Inpatient Hospital Stay: Payer: Medicare Other | Attending: Hematology | Admitting: Cardiology

## 2022-06-14 ENCOUNTER — Encounter: Payer: Self-pay | Admitting: Cardiology

## 2022-06-14 VITALS — BP 152/90 | HR 71 | Ht 62.0 in | Wt 112.6 lb

## 2022-06-14 DIAGNOSIS — T466X5A Adverse effect of antihyperlipidemic and antiarteriosclerotic drugs, initial encounter: Secondary | ICD-10-CM | POA: Diagnosis not present

## 2022-06-14 DIAGNOSIS — I10 Essential (primary) hypertension: Secondary | ICD-10-CM | POA: Diagnosis not present

## 2022-06-14 DIAGNOSIS — M791 Myalgia, unspecified site: Secondary | ICD-10-CM | POA: Diagnosis not present

## 2022-06-14 DIAGNOSIS — I25119 Atherosclerotic heart disease of native coronary artery with unspecified angina pectoris: Secondary | ICD-10-CM

## 2022-06-14 MED ORDER — NITROGLYCERIN 0.4 MG SL SUBL: 0.4000 mg | SUBLINGUAL_TABLET | SUBLINGUAL | 3 refills | Status: AC | PRN

## 2022-06-14 NOTE — Patient Instructions (Addendum)

## 2022-06-17 DIAGNOSIS — M25511 Pain in right shoulder: Secondary | ICD-10-CM | POA: Diagnosis not present

## 2022-08-02 DIAGNOSIS — M25552 Pain in left hip: Secondary | ICD-10-CM | POA: Diagnosis not present

## 2022-08-11 DIAGNOSIS — E782 Mixed hyperlipidemia: Secondary | ICD-10-CM | POA: Diagnosis not present

## 2022-08-11 DIAGNOSIS — I10 Essential (primary) hypertension: Secondary | ICD-10-CM | POA: Diagnosis not present

## 2022-08-30 ENCOUNTER — Other Ambulatory Visit: Payer: Self-pay | Admitting: Cardiology

## 2022-08-30 DIAGNOSIS — M25552 Pain in left hip: Secondary | ICD-10-CM | POA: Diagnosis not present

## 2022-08-30 DIAGNOSIS — M25511 Pain in right shoulder: Secondary | ICD-10-CM | POA: Diagnosis not present

## 2022-09-23 ENCOUNTER — Encounter (HOSPITAL_COMMUNITY): Payer: Self-pay | Admitting: Hematology

## 2022-09-30 ENCOUNTER — Inpatient Hospital Stay: Payer: 59 | Attending: Hematology

## 2022-10-07 ENCOUNTER — Telehealth: Payer: Medicare Other | Admitting: Physician Assistant

## 2022-10-25 ENCOUNTER — Other Ambulatory Visit (HOSPITAL_COMMUNITY)
Admission: RE | Admit: 2022-10-25 | Discharge: 2022-10-25 | Disposition: A | Discharge status: Home / Self Care | Payer: 59 | Source: Ambulatory Visit | Attending: Physician Assistant | Admitting: Physician Assistant

## 2022-10-25 DIAGNOSIS — E538 Deficiency of other specified B group vitamins: Secondary | ICD-10-CM | POA: Diagnosis not present

## 2022-10-25 DIAGNOSIS — E559 Vitamin D deficiency, unspecified: Secondary | ICD-10-CM | POA: Diagnosis not present

## 2022-10-25 DIAGNOSIS — D509 Iron deficiency anemia, unspecified: Secondary | ICD-10-CM | POA: Diagnosis not present

## 2022-10-25 LAB — CBC WITH DIFFERENTIAL/PLATELET
Abs Immature Granulocytes: 0.02 10*3/uL (ref 0.00–0.07)
Basophils Absolute: 0 10*3/uL (ref 0.0–0.1)
Basophils Relative: 1 %
Eosinophils Absolute: 0.2 10*3/uL (ref 0.0–0.5)
Eosinophils Relative: 2 %
HCT: 39.4 % (ref 36.0–46.0)
Hemoglobin: 12.6 g/dL (ref 12.0–15.0)
Immature Granulocytes: 0 %
Lymphocytes Relative: 17 %
Lymphs Abs: 1.1 10*3/uL (ref 0.7–4.0)
MCH: 30.6 pg (ref 26.0–34.0)
MCHC: 32 g/dL (ref 30.0–36.0)
MCV: 95.6 fL (ref 80.0–100.0)
Monocytes Absolute: 0.6 10*3/uL (ref 0.1–1.0)
Monocytes Relative: 10 %
Neutro Abs: 4.5 10*3/uL (ref 1.7–7.7)
Neutrophils Relative %: 70 %
Platelets: 223 10*3/uL (ref 150–400)
RBC: 4.12 MIL/uL (ref 3.87–5.11)
RDW: 13.3 % (ref 11.5–15.5)
WBC: 6.4 10*3/uL (ref 4.0–10.5)
nRBC: 0 % (ref 0.0–0.2)

## 2022-10-25 LAB — IRON AND TIBC
Iron: 37 ug/dL (ref 28–170)
Saturation Ratios: 12 % (ref 10.4–31.8)
TIBC: 304 ug/dL (ref 250–450)
UIBC: 267 ug/dL

## 2022-10-25 LAB — FERRITIN: Ferritin: 92 ng/mL (ref 11–307)

## 2022-10-25 LAB — VITAMIN B12: Vitamin B-12: 493 pg/mL (ref 180–914)

## 2022-10-26 LAB — MISC LABCORP TEST (SEND OUT): Labcorp test code: 81950

## 2022-10-31 LAB — METHYLMALONIC ACID, SERUM: Methylmalonic Acid, Quantitative: 275 nmol/L (ref 0–378)

## 2022-11-10 DIAGNOSIS — E7849 Other hyperlipidemia: Secondary | ICD-10-CM | POA: Diagnosis not present

## 2022-11-10 DIAGNOSIS — K2100 Gastro-esophageal reflux disease with esophagitis, without bleeding: Secondary | ICD-10-CM | POA: Diagnosis not present

## 2022-11-10 DIAGNOSIS — N183 Chronic kidney disease, stage 3 unspecified: Secondary | ICD-10-CM | POA: Diagnosis not present

## 2022-11-10 DIAGNOSIS — I10 Essential (primary) hypertension: Secondary | ICD-10-CM | POA: Diagnosis not present

## 2022-11-14 DIAGNOSIS — M1711 Unilateral primary osteoarthritis, right knee: Secondary | ICD-10-CM | POA: Diagnosis not present

## 2022-11-14 DIAGNOSIS — R4582 Worries: Secondary | ICD-10-CM | POA: Diagnosis not present

## 2022-11-14 DIAGNOSIS — K2100 Gastro-esophageal reflux disease with esophagitis, without bleeding: Secondary | ICD-10-CM | POA: Diagnosis not present

## 2022-11-14 DIAGNOSIS — E440 Moderate protein-calorie malnutrition: Secondary | ICD-10-CM | POA: Diagnosis not present

## 2022-11-14 DIAGNOSIS — M810 Age-related osteoporosis without current pathological fracture: Secondary | ICD-10-CM | POA: Diagnosis not present

## 2022-11-14 DIAGNOSIS — R110 Nausea: Secondary | ICD-10-CM | POA: Diagnosis not present

## 2022-11-14 DIAGNOSIS — I10 Essential (primary) hypertension: Secondary | ICD-10-CM | POA: Diagnosis not present

## 2022-11-14 DIAGNOSIS — E7849 Other hyperlipidemia: Secondary | ICD-10-CM | POA: Diagnosis not present

## 2022-11-14 DIAGNOSIS — I25119 Atherosclerotic heart disease of native coronary artery with unspecified angina pectoris: Secondary | ICD-10-CM | POA: Diagnosis not present

## 2022-11-14 DIAGNOSIS — E039 Hypothyroidism, unspecified: Secondary | ICD-10-CM | POA: Diagnosis not present

## 2022-11-14 DIAGNOSIS — N1831 Chronic kidney disease, stage 3a: Secondary | ICD-10-CM | POA: Diagnosis not present

## 2022-11-14 DIAGNOSIS — I700 Atherosclerosis of aorta: Secondary | ICD-10-CM | POA: Diagnosis not present

## 2022-11-15 DIAGNOSIS — E0590 Thyrotoxicosis, unspecified without thyrotoxic crisis or storm: Secondary | ICD-10-CM | POA: Diagnosis not present

## 2022-11-17 DIAGNOSIS — R110 Nausea: Secondary | ICD-10-CM | POA: Diagnosis not present

## 2022-11-28 ENCOUNTER — Other Ambulatory Visit: Payer: Self-pay | Admitting: Hematology

## 2022-11-28 ENCOUNTER — Other Ambulatory Visit: Payer: Self-pay | Admitting: Cardiology

## 2022-11-28 DIAGNOSIS — E559 Vitamin D deficiency, unspecified: Secondary | ICD-10-CM

## 2023-01-25 NOTE — Progress Notes (Unsigned)
    Cardiology Office Note  Date: 01/26/2023   ID: Andrea Jackson, DOB [birth year], MRN [id number]  History of Present Illness: Andrea Jackson is an 89 y.o. female last seen in October 2023.  She is here for a routine visit.  Still functional with ADLs, lives in her own home and does light yard work.  She reports intermittent left lower thoracic pressure, not progressive or specifically exertional.  She has not tried nitroglycerin, usually just rests.  No palpitations or syncope.  ECG today shows sinus bradycardia with R prime in lead V1 and V2, left anterior fascicular block, decreased R wave progression.  I reviewed her medications.  We discussed increasing Imdur to 30 mg twice daily for now.  She continues to follow with Dr. Reuel Boom.  I reviewed her lab work from September 2023.  Physical Exam: VS:  BP 130/76   Pulse (!) 59   Ht 5\' 2"  (1.575 m)   Wt 108 lb 3.2 oz (49.1 kg)   SpO2 94%   BMI 19.79 kg/m , BMI Body mass index is 19.79 kg/m.  Wt Readings from Last 3 Encounters:  01/26/23 108 lb 3.2 oz (49.1 kg)  06/14/22 112 lb 9.6 oz (51.1 kg)  03/31/22 112 lb 9.6 oz (51.1 kg)    General: Patient appears comfortable at rest. HEENT: Conjunctiva and lids normal. Neck: Supple, no elevated JVP or carotid bruits. Lungs: Clear to auscultation, nonlabored breathing at rest. Cardiac: Regular rate and rhythm, no S3, 1/6 systolic murmur. Extremities: No pitting edema.  ECG:  An ECG dated 08/23/2021 was personally reviewed today and demonstrated:  Sinus rhythm with left anterior fascicular block and decreased R wave progression.  Labwork: 02/05/2022: BUN 17; Creatinine, Ser 0.74; Potassium 4.0; Sodium 137 10/25/2022: Hemoglobin 12.6; Platelets 223  September 2023: Potassium 4.9, BUN 29, creatinine 1.29, AST 20, ALT 19, cholesterol 252, HDL 67, triglycerides 151, LDL 155  Other Studies Reviewed Today:  No interval cardiac testing for review today.  Assessment and Plan:  1.  CAD status  post angioplasty of nondominant RCA in 2010 in the setting of NSTEMI.  Follow-up Lexiscan Myoview in December 2022 was low risk without definite ischemia and LVEF 63%.  She does report intermittent chest pressure as discussed above, not entirely typical but still suggestive of angina.  ECG shows no acute changes.  We will increase Imdur to 30 mg twice daily, otherwise continue Toprol-XL, Norvasc, Cozaar, and as needed nitroglycerin.  2.  Essential hypertension.  No changes made in current regimen.  3.  Mixed hyperlipidemia.  LDL 155 in September of last year.  She has a history of statin myalgias and prefers conservative management at this point.  Disposition:  Follow up  6 months.  Signed, Jonelle Sidle, M.D., F.A.C.C. [facility] at [facility]

## 2023-01-26 ENCOUNTER — Encounter: Payer: Self-pay | Admitting: Cardiology

## 2023-01-26 ENCOUNTER — Ambulatory Visit: Payer: 59 | Attending: Cardiology | Admitting: Cardiology

## 2023-01-26 VITALS — BP 130/76 | HR 59 | Ht 62.0 in | Wt 108.2 lb

## 2023-01-26 DIAGNOSIS — T466X5D Adverse effect of antihyperlipidemic and antiarteriosclerotic drugs, subsequent encounter: Secondary | ICD-10-CM

## 2023-01-26 DIAGNOSIS — I25119 Atherosclerotic heart disease of native coronary artery with unspecified angina pectoris: Secondary | ICD-10-CM

## 2023-01-26 DIAGNOSIS — I10 Essential (primary) hypertension: Secondary | ICD-10-CM

## 2023-01-26 DIAGNOSIS — E782 Mixed hyperlipidemia: Secondary | ICD-10-CM | POA: Diagnosis not present

## 2023-01-26 DIAGNOSIS — M791 Myalgia, unspecified site: Secondary | ICD-10-CM | POA: Diagnosis not present

## 2023-01-26 MED ORDER — ISOSORBIDE MONONITRATE ER 30 MG PO TB24: 30.0000 mg | ORAL_TABLET | Freq: Two times a day (BID) | ORAL | 2 refills | Status: AC

## 2023-01-26 NOTE — Patient Instructions (Addendum)
Medication Instructions:  Your physician has recommended you make the following change in your medication:  Increase isosorbide mononitrate 30 mg to twice daily Continue other medications the same  Labwork: none  Testing/Procedures: none  Follow-Up: Your physician recommends that you schedule a follow-up appointment in: 6 months  Any Other Special Instructions Will Be Listed Below (If Applicable).  If you need a refill on your cardiac medications before your next appointment, please call your pharmacy.

## 2023-02-07 DIAGNOSIS — R4582 Worries: Secondary | ICD-10-CM | POA: Diagnosis not present

## 2023-02-07 DIAGNOSIS — Z0001 Encounter for general adult medical examination with abnormal findings: Secondary | ICD-10-CM | POA: Diagnosis not present

## 2023-02-07 DIAGNOSIS — E7849 Other hyperlipidemia: Secondary | ICD-10-CM | POA: Diagnosis not present

## 2023-02-07 DIAGNOSIS — I700 Atherosclerosis of aorta: Secondary | ICD-10-CM | POA: Diagnosis not present

## 2023-02-07 DIAGNOSIS — E559 Vitamin D deficiency, unspecified: Secondary | ICD-10-CM | POA: Diagnosis not present

## 2023-02-07 DIAGNOSIS — E039 Hypothyroidism, unspecified: Secondary | ICD-10-CM | POA: Diagnosis not present

## 2023-02-07 DIAGNOSIS — Z23 Encounter for immunization: Secondary | ICD-10-CM | POA: Diagnosis not present

## 2023-02-07 DIAGNOSIS — I25119 Atherosclerotic heart disease of native coronary artery with unspecified angina pectoris: Secondary | ICD-10-CM | POA: Diagnosis not present

## 2023-02-07 DIAGNOSIS — I10 Essential (primary) hypertension: Secondary | ICD-10-CM | POA: Diagnosis not present

## 2023-02-07 DIAGNOSIS — E440 Moderate protein-calorie malnutrition: Secondary | ICD-10-CM | POA: Diagnosis not present

## 2023-02-07 DIAGNOSIS — K2100 Gastro-esophageal reflux disease with esophagitis, without bleeding: Secondary | ICD-10-CM | POA: Diagnosis not present

## 2023-02-07 DIAGNOSIS — N1831 Chronic kidney disease, stage 3a: Secondary | ICD-10-CM | POA: Diagnosis not present

## 2023-05-24 DIAGNOSIS — M19012 Primary osteoarthritis, left shoulder: Secondary | ICD-10-CM | POA: Diagnosis not present

## 2023-05-24 DIAGNOSIS — Z681 Body mass index (BMI) 19 or less, adult: Secondary | ICD-10-CM | POA: Diagnosis not present

## 2023-06-06 DIAGNOSIS — E782 Mixed hyperlipidemia: Secondary | ICD-10-CM | POA: Diagnosis not present

## 2023-06-06 DIAGNOSIS — E7849 Other hyperlipidemia: Secondary | ICD-10-CM | POA: Diagnosis not present

## 2023-06-06 DIAGNOSIS — K219 Gastro-esophageal reflux disease without esophagitis: Secondary | ICD-10-CM | POA: Diagnosis not present

## 2023-06-06 DIAGNOSIS — N1831 Chronic kidney disease, stage 3a: Secondary | ICD-10-CM | POA: Diagnosis not present

## 2023-06-13 DIAGNOSIS — E782 Mixed hyperlipidemia: Secondary | ICD-10-CM | POA: Diagnosis not present

## 2023-06-13 DIAGNOSIS — N1831 Chronic kidney disease, stage 3a: Secondary | ICD-10-CM | POA: Diagnosis not present

## 2023-06-13 DIAGNOSIS — K2100 Gastro-esophageal reflux disease with esophagitis, without bleeding: Secondary | ICD-10-CM | POA: Diagnosis not present

## 2023-06-13 DIAGNOSIS — I25119 Atherosclerotic heart disease of native coronary artery with unspecified angina pectoris: Secondary | ICD-10-CM | POA: Diagnosis not present

## 2023-06-13 DIAGNOSIS — I700 Atherosclerosis of aorta: Secondary | ICD-10-CM | POA: Diagnosis not present

## 2023-06-13 DIAGNOSIS — R5383 Other fatigue: Secondary | ICD-10-CM | POA: Diagnosis not present

## 2023-06-13 DIAGNOSIS — M810 Age-related osteoporosis without current pathological fracture: Secondary | ICD-10-CM | POA: Diagnosis not present

## 2023-06-13 DIAGNOSIS — M1711 Unilateral primary osteoarthritis, right knee: Secondary | ICD-10-CM | POA: Diagnosis not present

## 2023-06-13 DIAGNOSIS — E039 Hypothyroidism, unspecified: Secondary | ICD-10-CM | POA: Diagnosis not present

## 2023-06-13 DIAGNOSIS — I10 Essential (primary) hypertension: Secondary | ICD-10-CM | POA: Diagnosis not present

## 2023-06-13 DIAGNOSIS — E7849 Other hyperlipidemia: Secondary | ICD-10-CM | POA: Diagnosis not present

## 2023-06-13 DIAGNOSIS — Z23 Encounter for immunization: Secondary | ICD-10-CM | POA: Diagnosis not present

## 2023-06-13 DIAGNOSIS — R4582 Worries: Secondary | ICD-10-CM | POA: Diagnosis not present

## 2023-06-13 DIAGNOSIS — E440 Moderate protein-calorie malnutrition: Secondary | ICD-10-CM | POA: Diagnosis not present

## 2023-08-07 ENCOUNTER — Encounter: Payer: Self-pay | Admitting: Cardiology

## 2023-08-07 ENCOUNTER — Ambulatory Visit: Payer: 59 | Attending: Cardiology | Admitting: Cardiology

## 2023-08-07 VITALS — BP 120/84 | HR 60 | Ht 63.0 in | Wt 109.0 lb

## 2023-08-07 DIAGNOSIS — I10 Essential (primary) hypertension: Secondary | ICD-10-CM | POA: Diagnosis not present

## 2023-08-07 DIAGNOSIS — I25119 Atherosclerotic heart disease of native coronary artery with unspecified angina pectoris: Secondary | ICD-10-CM

## 2023-08-07 DIAGNOSIS — E782 Mixed hyperlipidemia: Secondary | ICD-10-CM

## 2023-08-07 DIAGNOSIS — M791 Myalgia, unspecified site: Secondary | ICD-10-CM | POA: Diagnosis not present

## 2023-08-07 DIAGNOSIS — T466X5D Adverse effect of antihyperlipidemic and antiarteriosclerotic drugs, subsequent encounter: Secondary | ICD-10-CM

## 2023-08-07 DIAGNOSIS — T466X5A Adverse effect of antihyperlipidemic and antiarteriosclerotic drugs, initial encounter: Secondary | ICD-10-CM

## 2023-08-07 MED ORDER — NEXLIZET 180-10 MG PO TABS: 1.0000 | ORAL_TABLET | Freq: Every day | ORAL | Status: DC

## 2023-08-07 NOTE — Progress Notes (Signed)
    Cardiology Office Note  Date: 08/07/2023   ID: Andrea Jackson, DOB 1934/05/24, MRN [id number]  History of Present Illness: Andrea Jackson is an 89 y.o. female last seen in May.  She is here for a follow-up visit.  Reports no progressive angina or nitroglycerin use in the interim.  Still lives in her own home and functional with ADLs.  She reports no palpitations, no dizziness or syncope.  I reviewed her medications.  Current cardiovascular regimen includes Norvasc, Imdur, Cozaar, Toprol-XL, and as needed nitroglycerin.  She has history of statin myalgias.  We discussed a trial of Nexlizet.  Her last LDL was 155.  Physical Exam: VS:  BP 120/84   Pulse 60   Ht 5\' 3"  (1.6 m)   Wt 109 lb (49.4 kg)   SpO2 94%   BMI 19.31 kg/m , BMI Body mass index is 19.31 kg/m.  Wt Readings from Last 3 Encounters:  08/07/23 109 lb (49.4 kg)  01/26/23 108 lb 3.2 oz (49.1 kg)  06/14/22 112 lb 9.6 oz (51.1 kg)    General: Patient appears comfortable at rest. HEENT: Conjunctiva and lids normal. Neck: Supple, no elevated JVP or carotid bruits. Lungs: Clear to auscultation, nonlabored breathing at rest. Cardiac: Regular rate and rhythm, no S3, 1/6 systolic murmur. Extremities: No pitting edema.  ECG:  An ECG dated 01/26/2023 was personally reviewed today and demonstrated:  Sinus bradycardia with R' in lead V1 and V2, left anterior fascicular block, decreased R wave progression.  Labwork: 10/25/2022: Hemoglobin 12.6; Platelets 223  October 2024: Hemoglobin 13.6, platelets 262  Other Studies Reviewed Today:  No interval cardiac testing for review today.  Assessment and Plan:  1.  CAD status post angioplasty of nondominant RCA in 2010 in the setting of NSTEMI.  Follow-up Lexiscan Myoview in December 2022 was low risk without definite ischemia and LVEF 63%.  She reports no progressive angina at this time.  Currently not on aspirin.  Otherwise continues on Norvasc, Imdur, Toprol-XL, and as needed  nitroglycerin.   2.  Primary hypertension.  Blood pressure reasonably controlled today.  Also on HCTZ and Cozaar in addition to the above.   3.  Mixed hyperlipidemia.  LDL 155 in September of last year.  She has a history of statin myalgias.  Discussed trial of Nexlizet.  Disposition:  Follow up  6 months.  Signed, Jonelle Sidle, M.D., F.A.C.C. [facility] at [facility]

## 2023-08-07 NOTE — Patient Instructions (Addendum)
Medication Instructions:  Your physician has recommended you make the following change in your medication:  Start nexlizet 180/10 mg daily Please contact our office if you are able or unable to tolerate nexlizet.  Continue all other medications as prescribed  Labwork: Your physician recommends that you return for a FASTING lipid profile in 6 months if you are able to tolerate nexlizet. Please do not eat or drink for at least 8 hours when you have this done. You may take your medications that morning with a sip of water. [employer] or [employer] ([address]. [place])  Testing/Procedures: none  Follow-Up: Your physician recommends that you schedule a follow-up appointment in: 6 months  Any Other Special Instructions Will Be Listed Below (If Applicable).  If you need a refill on your cardiac medications before your next appointment, please call your pharmacy.

## 2023-09-01 ENCOUNTER — Telehealth: Payer: Self-pay | Admitting: *Deleted

## 2023-09-01 DIAGNOSIS — I25119 Atherosclerotic heart disease of native coronary artery with unspecified angina pectoris: Secondary | ICD-10-CM

## 2023-09-01 DIAGNOSIS — Z79899 Other long term (current) drug therapy: Secondary | ICD-10-CM

## 2023-09-01 DIAGNOSIS — E782 Mixed hyperlipidemia: Secondary | ICD-10-CM

## 2023-09-01 NOTE — Telephone Encounter (Signed)
-----   Message from Nurse Isabelle Course A sent at 08/07/2023 11:25 AM EST ----- Regarding: will need FLP in 6 mths if abe to tolerate nexlizet-check with patient when this comes if not documented yet Will need new rx sent for nexlizet and FLP if tolerating it okay

## 2023-09-04 ENCOUNTER — Other Ambulatory Visit (HOSPITAL_COMMUNITY): Payer: Self-pay

## 2023-09-04 ENCOUNTER — Telehealth: Payer: Self-pay | Admitting: Pharmacy Technician

## 2023-09-04 MED ORDER — NEXLIZET 180-10 MG PO TABS: 1.0000 | ORAL_TABLET | Freq: Every day | ORAL | 6 refills | Status: DC

## 2023-09-04 NOTE — Telephone Encounter (Signed)
Reports she was able tolerate nexlizet. Advised that new prescription will be sent to [facility] and lab order faxed to [facility].

## 2023-09-04 NOTE — Telephone Encounter (Signed)
Pharmacy Patient Advocate Encounter  Received notification from [facility] that Prior Authorization for nexlizet has been APPROVED from 09/04/23 to 03/04/24   PA #/Case ID/Reference #: [id number]

## 2023-09-04 NOTE — Telephone Encounter (Signed)
Pharmacy Patient Advocate Encounter   Received notification from CoverMyMeds that prior authorization for nexlizet is required/requested.   Insurance verification completed.   The patient is insured through [facility] .   Per test claim: PA required; PA submitted to above mentioned insurance via CoverMyMeds Key/confirmation #/EOC [id number] Status is pending

## 2023-12-11 ENCOUNTER — Other Ambulatory Visit: Payer: Self-pay | Admitting: Hematology

## 2023-12-11 DIAGNOSIS — E559 Vitamin D deficiency, unspecified: Secondary | ICD-10-CM

## 2023-12-25 ENCOUNTER — Encounter (HOSPITAL_COMMUNITY): Payer: Self-pay | Admitting: Hematology

## 2024-01-08 DIAGNOSIS — I10 Essential (primary) hypertension: Secondary | ICD-10-CM | POA: Diagnosis not present

## 2024-01-08 DIAGNOSIS — R5383 Other fatigue: Secondary | ICD-10-CM | POA: Diagnosis not present

## 2024-01-08 DIAGNOSIS — R293 Abnormal posture: Secondary | ICD-10-CM | POA: Diagnosis not present

## 2024-01-08 DIAGNOSIS — E039 Hypothyroidism, unspecified: Secondary | ICD-10-CM | POA: Diagnosis not present

## 2024-01-17 ENCOUNTER — Encounter (HOSPITAL_COMMUNITY): Payer: Self-pay | Admitting: Hematology

## 2024-01-22 ENCOUNTER — Other Ambulatory Visit (HOSPITAL_COMMUNITY)
Admission: RE | Admit: 2024-01-22 | Discharge: 2024-01-22 | Disposition: A | Discharge status: Home / Self Care | Source: Ambulatory Visit | Attending: Cardiology | Admitting: Cardiology

## 2024-01-22 DIAGNOSIS — E782 Mixed hyperlipidemia: Secondary | ICD-10-CM | POA: Diagnosis not present

## 2024-01-22 LAB — LIPID PANEL
Cholesterol: 256 mg/dL — ABNORMAL HIGH (ref 0–200)
HDL: 68 mg/dL (ref >40)
LDL Cholesterol: 167 mg/dL — ABNORMAL HIGH (ref 0–99)
Total CHOL/HDL Ratio: 3.8 ratio
Triglycerides: 105 mg/dL (ref <150)
VLDL: 21 mg/dL (ref 0–40)

## 2024-01-24 ENCOUNTER — Ambulatory Visit: Payer: 59 | Attending: Cardiology | Admitting: Cardiology

## 2024-01-24 ENCOUNTER — Encounter: Payer: Self-pay | Admitting: Cardiology

## 2024-01-24 VITALS — BP 140/66 | HR 67 | Ht 65.0 in | Wt 116.3 lb

## 2024-01-24 DIAGNOSIS — I25119 Atherosclerotic heart disease of native coronary artery with unspecified angina pectoris: Secondary | ICD-10-CM | POA: Diagnosis not present

## 2024-01-24 DIAGNOSIS — I10 Essential (primary) hypertension: Secondary | ICD-10-CM | POA: Diagnosis not present

## 2024-01-24 DIAGNOSIS — E782 Mixed hyperlipidemia: Secondary | ICD-10-CM | POA: Diagnosis not present

## 2024-01-24 DIAGNOSIS — T466X5D Adverse effect of antihyperlipidemic and antiarteriosclerotic drugs, subsequent encounter: Secondary | ICD-10-CM

## 2024-01-24 DIAGNOSIS — M791 Myalgia, unspecified site: Secondary | ICD-10-CM | POA: Diagnosis not present

## 2024-01-24 NOTE — Progress Notes (Signed)
    Cardiology Office Note  Date: 01/24/2024   ID: DANNISHA THREADGILL, DOB [birth year], MRN [id number]  History of Present Illness: Andrea Jackson is an 89 y.o. female last seen in November 2024.  She is here for a follow-up visit.  She does not report any angina or interval nitroglycerin  use.  She has had some right knee pain (previously had joint replacement), also ankle edema on that side.  No orthopnea or PND.  No palpitations or syncope.  We went over her medications.  She states that she did take Nexlizet  initially, started to have some fullness in her abdomen and stopped it several weeks ago.  Her follow-up lipid panel would reflect that essentially given no change in her LDL.  We have talked about other options, she is willing to give that medication another try to see if the symptoms were definitely related.  I reviewed her ECG today which shows sinus rhythm with PACs and lead motion artifact.  Physical Exam: VS:  BP (!) 150/74 (BP Location: Right Arm)   Pulse 67   Ht 5\' 5"  (1.651 m)   Wt (!) 1163 lb 3.2 oz (527.6 kg)   SpO2 95%   BMI 193.57 kg/m , BMI Body mass index is 193.57 kg/m.  Wt Readings from Last 3 Encounters:  01/24/24 (!) 1163 lb 3.2 oz (527.6 kg)  08/07/23 109 lb (49.4 kg)  01/26/23 108 lb 3.2 oz (49.1 kg)    General: Patient appears comfortable at rest. HEENT: Conjunctiva and lids normal. Neck: Supple, no elevated JVP or carotid bruits. Lungs: Clear to auscultation, nonlabored breathing at rest. Cardiac: Regular rate and rhythm, no S3, 1/6 systolic murmur. Extremities: Mild right ankle edema.  ECG:  An ECG dated 01/26/2023 was personally reviewed today and demonstrated:  Sinus bradycardia, R' in lead V1 and V2, left anterior fascicular block, decreased R wave progression.  Labwork:    Component Value Date/Time   CHOL 256 (H) 01/22/2024 0915   TRIG 105 01/22/2024 0915   HDL 68 01/22/2024 0915   CHOLHDL 3.8 01/22/2024 0915   VLDL 21 01/22/2024 0915   LDLCALC  167 (H) 01/22/2024 0915   Other Studies Reviewed Today:  No interval cardiac testing for review today.  Assessment and Plan:  1.  CAD status post angioplasty of nondominant RCA in 2010 in the setting of NSTEMI.  Follow-up Lexiscan  Myoview  in December 2022 was low risk without definite ischemia and LVEF 63%.  She does not report any angina or interval nitroglycerin  use.  Continue Toprol -XL 25 mg daily and Imdur  30 mg twice daily.   2.  Primary hypertension.  Blood pressure elevated today, she continues to follow with Dr. Bearl Limes and reports compliance with her medications.  For now continue Cozaar  100 mg daily, Toprol -XL 25 mg daily, HCTZ 12.5 mg daily, and Norvasc  10 mg daily.   3.  Mixed hyperlipidemia.  Stated that she did take Nexlizet  initially, however stopped it several weeks ago given concerns about abdominal fullness.  This is reflected in the fact that her follow-up LDL has not come down.  We discussed the situation, she will try the medication again to make sure that the symptoms are related, if so would stop.  She is not interested in injectable agents.  Disposition:  Follow up 6 months.  Signed, Gerard Knight, M.D., F.A.C.C. [facility] at [facility]

## 2024-01-24 NOTE — Patient Instructions (Signed)
 Medication Instructions:  Your physician recommends that you continue on your current medications as directed. Please refer to the Current Medication list given to you today.   Labwork: None today  Testing/Procedures: None today  Follow-Up: 6 months  Any Other Special Instructions Will Be Listed Below (If Applicable).  If you need a refill on your cardiac medications before your next appointment, please call your pharmacy.

## 2024-01-25 ENCOUNTER — Ambulatory Visit: Payer: Self-pay | Admitting: *Deleted

## 2024-02-02 DIAGNOSIS — M25561 Pain in right knee: Secondary | ICD-10-CM | POA: Diagnosis not present

## 2024-02-02 DIAGNOSIS — M25571 Pain in right ankle and joints of right foot: Secondary | ICD-10-CM | POA: Diagnosis not present

## 2024-02-06 ENCOUNTER — Other Ambulatory Visit (HOSPITAL_COMMUNITY): Payer: Self-pay

## 2024-02-06 ENCOUNTER — Telehealth: Payer: Self-pay

## 2024-02-06 NOTE — Telephone Encounter (Signed)
 Pharmacy Patient Advocate Encounter  Received notification from [facility] that Prior Authorization for NEXLIZET  has been APPROVED from 09/13/23 to 09/11/24

## 2024-02-06 NOTE — Telephone Encounter (Signed)
 Pharmacy Patient Advocate Encounter   Received notification from CoverMyMeds that prior authorization for NEXLIZET  is required/requested.   Insurance verification completed.   The patient is insured through [employer] .   Per test claim: PA required; PA submitted to above mentioned insurance via CoverMyMeds Key/confirmation #/EOC [id number] Status is pending

## 2024-02-09 DIAGNOSIS — M25511 Pain in right shoulder: Secondary | ICD-10-CM | POA: Diagnosis not present

## 2024-02-11 IMAGING — DX DG HIP (WITH OR WITHOUT PELVIS) 2-3V*L*
2 series · 2 of 2 positions shown · non-contrast
Comparison: None Available.

CLINICAL DATA: Postoperative hip replacement

EXAM:
DG HIP (WITH OR WITHOUT PELVIS) 2-3V LEFT

[pelvis ap]
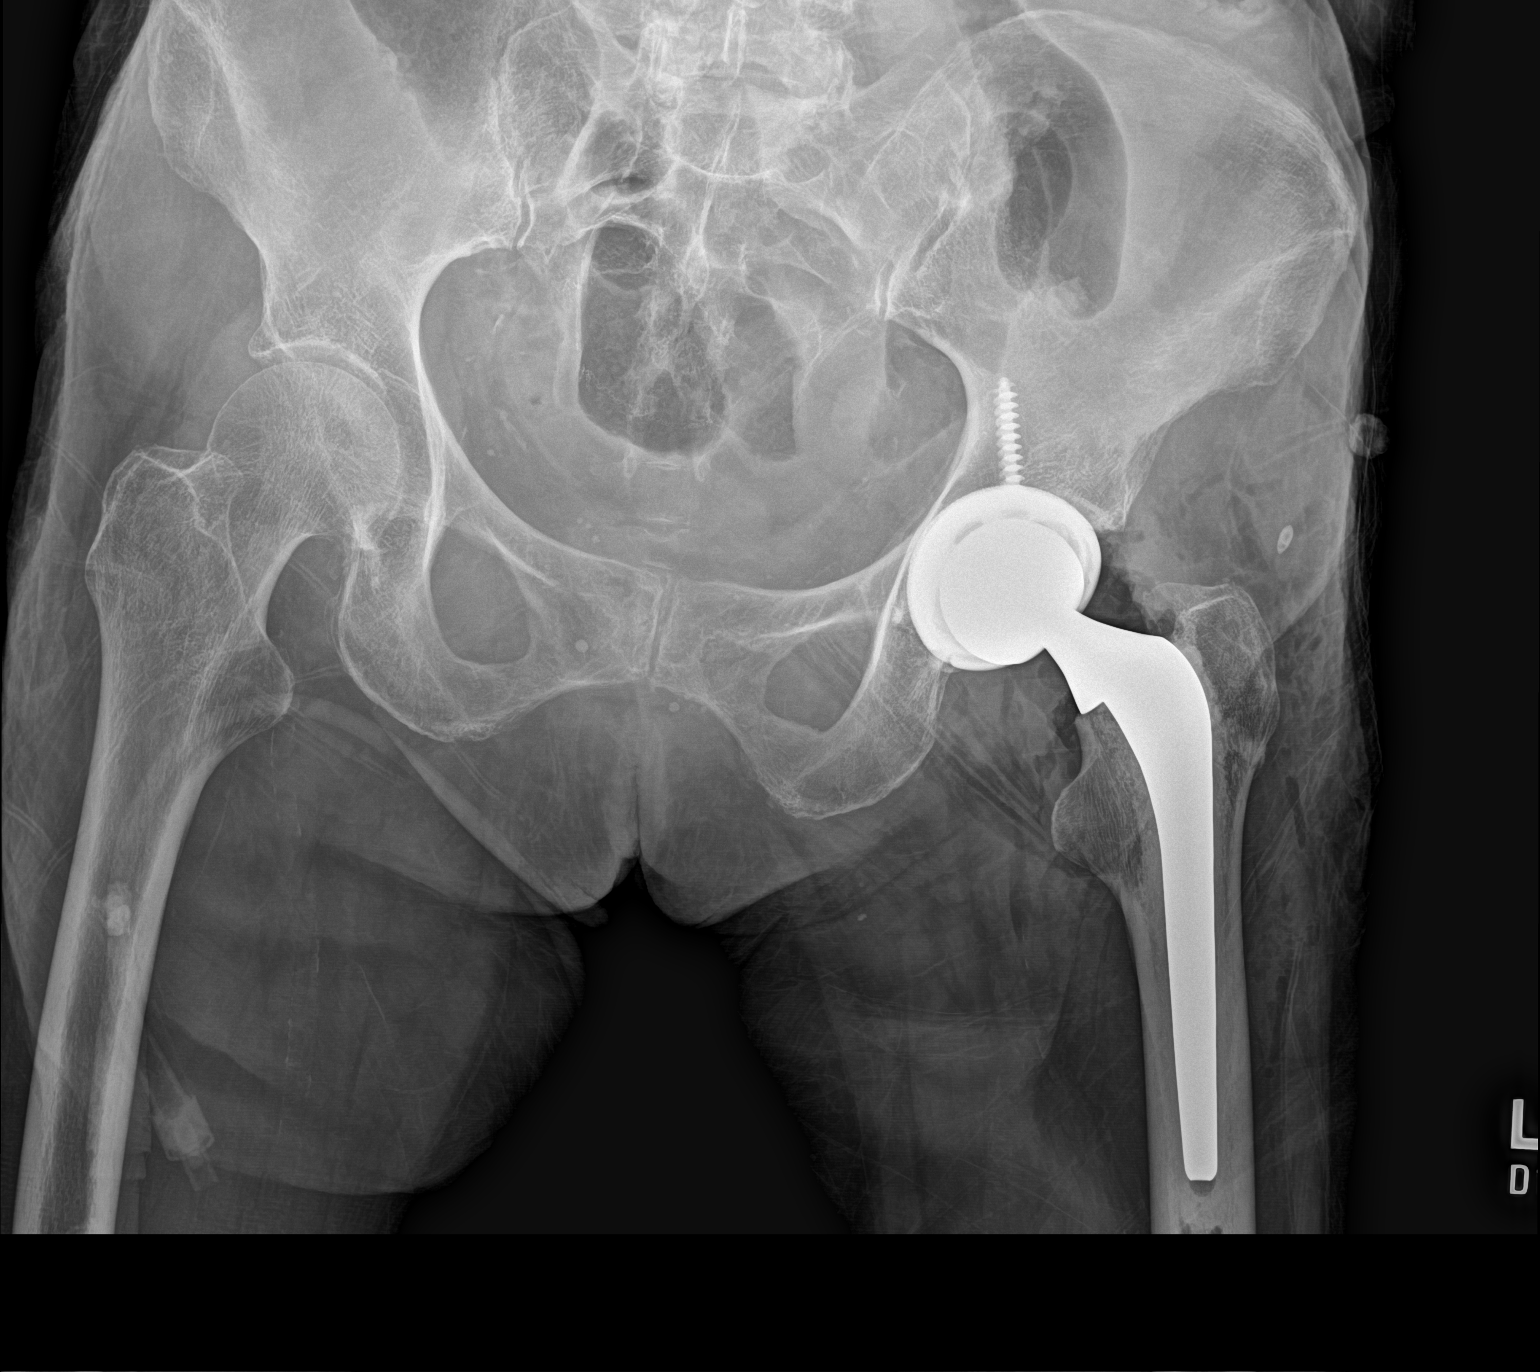

[hip lat]
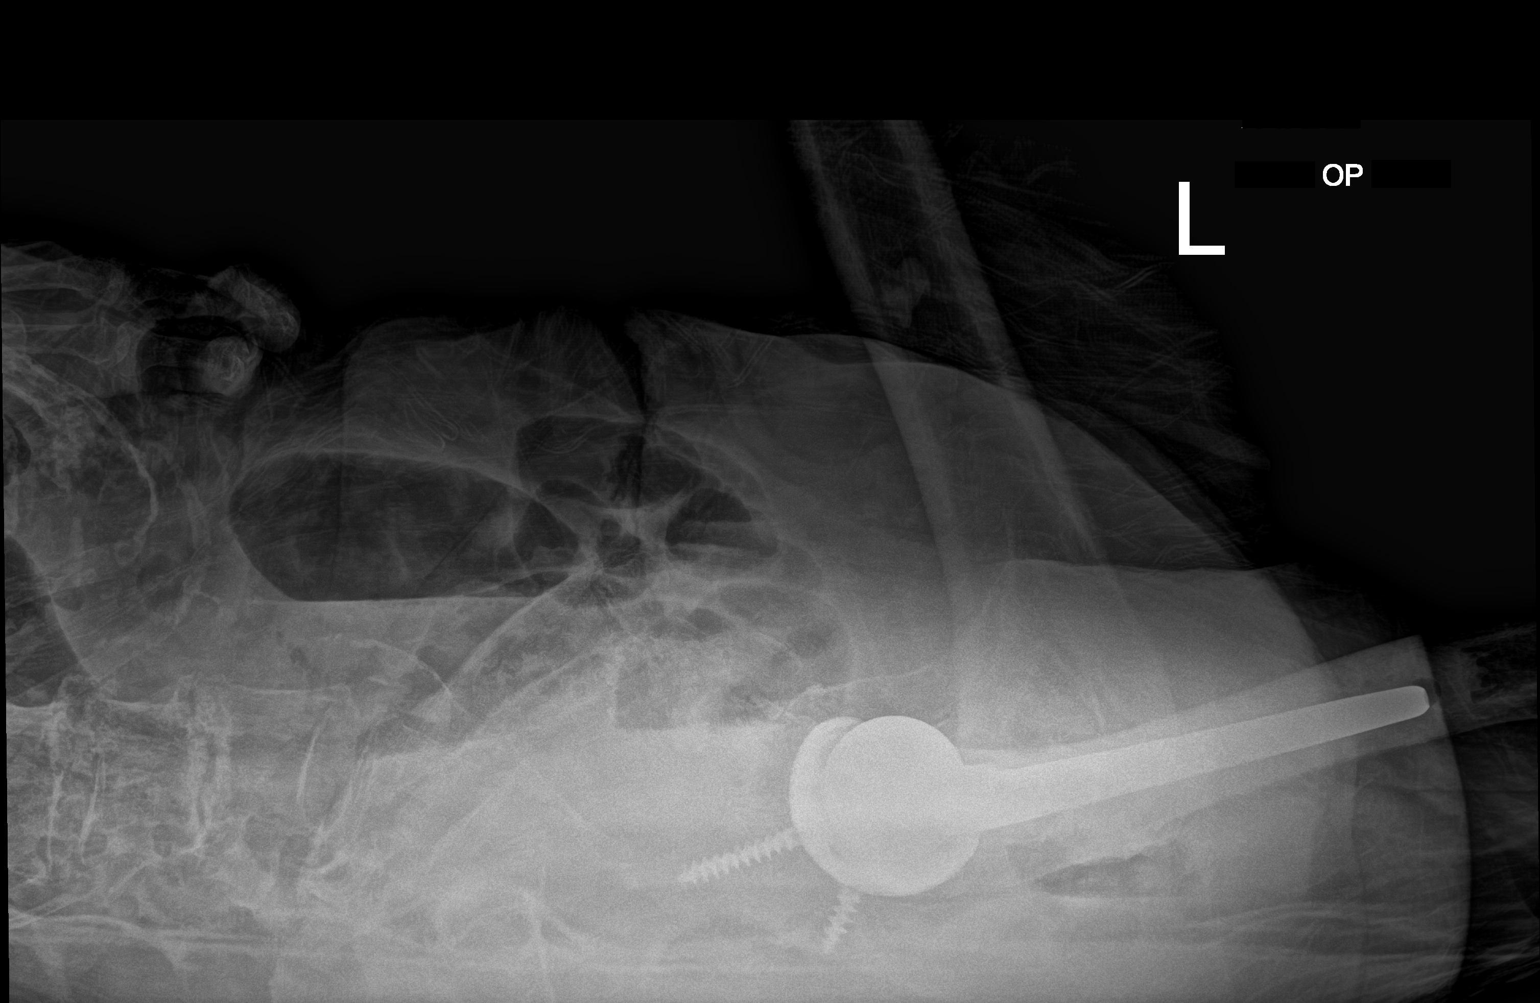

[2 of 2 positions shown; findings below may reference images not displayed]

FINDINGS: Status post left hip arthroplasty with expected overlying
postoperative change. No evidence of perihardware fracture or
component malpositioning.
IMPRESSION: Status post left hip arthroplasty with expected overlying
postoperative change. No evidence of perihardware fracture or
component malpositioning.

## 2024-02-13 DIAGNOSIS — E039 Hypothyroidism, unspecified: Secondary | ICD-10-CM | POA: Diagnosis not present

## 2024-02-13 DIAGNOSIS — R5383 Other fatigue: Secondary | ICD-10-CM | POA: Diagnosis not present

## 2024-04-04 DIAGNOSIS — E039 Hypothyroidism, unspecified: Secondary | ICD-10-CM | POA: Diagnosis not present

## 2024-04-04 DIAGNOSIS — E7849 Other hyperlipidemia: Secondary | ICD-10-CM | POA: Diagnosis not present

## 2024-04-04 DIAGNOSIS — R5383 Other fatigue: Secondary | ICD-10-CM | POA: Diagnosis not present

## 2024-04-04 DIAGNOSIS — I10 Essential (primary) hypertension: Secondary | ICD-10-CM | POA: Diagnosis not present

## 2024-04-04 DIAGNOSIS — E7800 Pure hypercholesterolemia, unspecified: Secondary | ICD-10-CM | POA: Diagnosis not present

## 2024-04-11 DIAGNOSIS — E039 Hypothyroidism, unspecified: Secondary | ICD-10-CM | POA: Diagnosis not present

## 2024-04-11 DIAGNOSIS — M1712 Unilateral primary osteoarthritis, left knee: Secondary | ICD-10-CM | POA: Diagnosis not present

## 2024-04-11 DIAGNOSIS — Z6820 Body mass index (BMI) 20.0-20.9, adult: Secondary | ICD-10-CM | POA: Diagnosis not present

## 2024-04-11 DIAGNOSIS — I10 Essential (primary) hypertension: Secondary | ICD-10-CM | POA: Diagnosis not present

## 2024-04-17 DIAGNOSIS — E039 Hypothyroidism, unspecified: Secondary | ICD-10-CM | POA: Diagnosis not present

## 2024-04-24 ENCOUNTER — Other Ambulatory Visit: Payer: Self-pay | Admitting: *Deleted

## 2024-05-07 DIAGNOSIS — M25552 Pain in left hip: Secondary | ICD-10-CM | POA: Diagnosis not present

## 2024-05-31 DIAGNOSIS — Z681 Body mass index (BMI) 19 or less, adult: Secondary | ICD-10-CM | POA: Diagnosis not present

## 2024-05-31 DIAGNOSIS — W5503XD Scratched by cat, subsequent encounter: Secondary | ICD-10-CM | POA: Diagnosis not present

## 2024-05-31 DIAGNOSIS — M1712 Unilateral primary osteoarthritis, left knee: Secondary | ICD-10-CM | POA: Diagnosis not present

## 2024-05-31 DIAGNOSIS — S8982XA Other specified injuries of left lower leg, initial encounter: Secondary | ICD-10-CM | POA: Diagnosis not present

## 2024-05-31 DIAGNOSIS — W19XXXA Unspecified fall, initial encounter: Secondary | ICD-10-CM | POA: Diagnosis not present

## 2024-05-31 DIAGNOSIS — Z043 Encounter for examination and observation following other accident: Secondary | ICD-10-CM | POA: Diagnosis not present

## 2024-05-31 DIAGNOSIS — S51812A Laceration without foreign body of left forearm, initial encounter: Secondary | ICD-10-CM | POA: Diagnosis not present

## 2024-09-02 ENCOUNTER — Other Ambulatory Visit: Payer: Self-pay | Admitting: Cardiology
